# Patient Record
Sex: Male | Born: 1935 | Race: White | Hispanic: No | Marital: Married | Smoking: Never smoker
Health system: Southern US, Community
[De-identification: ages and names within clinical notes are randomized; demographics above are authoritative.]

## PROBLEM LIST (undated history)

## (undated) DIAGNOSIS — G8929 Other chronic pain: Secondary | ICD-10-CM

## (undated) DIAGNOSIS — F0390 Unspecified dementia without behavioral disturbance: Secondary | ICD-10-CM

## (undated) DIAGNOSIS — C787 Secondary malignant neoplasm of liver and intrahepatic bile duct: Secondary | ICD-10-CM

## (undated) DIAGNOSIS — M545 Low back pain, unspecified: Secondary | ICD-10-CM

## (undated) DIAGNOSIS — E538 Deficiency of other specified B group vitamins: Secondary | ICD-10-CM

## (undated) DIAGNOSIS — I10 Essential (primary) hypertension: Secondary | ICD-10-CM

## (undated) DIAGNOSIS — R269 Unspecified abnormalities of gait and mobility: Secondary | ICD-10-CM

## (undated) HISTORY — DX: Deficiency of other specified B group vitamins: E53.8

## (undated) HISTORY — DX: Secondary malignant neoplasm of liver and intrahepatic bile duct: C78.7

## (undated) HISTORY — DX: Other chronic pain: G89.29

## (undated) HISTORY — PX: COLONOSCOPY: SHX5424

## (undated) HISTORY — PX: CATARACT EXTRACTION, BILATERAL: SHX1313

## (undated) HISTORY — DX: Unspecified dementia without behavioral disturbance: F03.90

## (undated) HISTORY — DX: Unspecified abnormalities of gait and mobility: R26.9

## (undated) HISTORY — DX: Low back pain, unspecified: M54.50

## (undated) HISTORY — DX: Low back pain: M54.5

---

## 2000-11-24 ENCOUNTER — Ambulatory Visit (HOSPITAL_COMMUNITY): Admission: RE | Admit: 2000-11-24 | Discharge: 2000-11-24 | Payer: Self-pay | Admitting: Internal Medicine

## 2011-01-22 ENCOUNTER — Telehealth: Payer: Self-pay

## 2011-01-22 NOTE — Telephone Encounter (Signed)
Gastroenterology Pre-Procedure Form  Date of referral:  01/20/2011          Referring physician: Dr. Ouida Sills   PATIENT INFORMATION:  Luis Freeman is a 77 y.o., male (DOB=05/04/1933).  PROCEDURE: Procedure(s) requested: colonoscopy Procedure Reason: screening for colon cancer  PATIENT REVIEW QUESTIONS: The patient reports the following:   1. Diabetes Melitis: no 2. Joint replacements in the past 12 months: no 3. Major health problems in the past 3 months: no 4. Has an artificial valve or MVP:no 5. Has been advised in past to take antibiotics in advance of a procedure like teeth cleaning: no}    MEDICATIONS & ALLERGIES:    Patient reports the following regarding taking any blood thinners:   Plavix? no Aspirin? Yes  81 mg Coumadin?  no  Patient confirms/reports the following medications:  Current Outpatient Prescriptions  Medication Sig Dispense Refill  . amLODipine (NORVASC) 5 MG tablet Take 5 mg by mouth daily.        Marland Kitchen aspirin 81 MG tablet Take 81 mg by mouth daily.        . benazepril (LOTENSIN) 40 MG tablet Take 40 mg by mouth daily.        . hydrochlorothiazide 25 MG tablet Take 25 mg by mouth daily. 1/2 tablet daily         Patient confirms/reports the following allergies:  No Known Allergies  Patient is appropriate to schedule for requested procedure(s): yes  AUTHORIZATION INFORMATION Primary Insurance: Medicare    ID #: [id number]  Pre-Cert / Auth required: Pre-Cert / Auth #:   Secondary Insurance: BCBS state    ID #: [id number]   Group #:  Pre-Cert / Auth required:     No orders of the defined types were placed in this encounter.    SCHEDULE INFORMATION: Procedure has been scheduled as follows:  Date: 02/04/2011   Time: 9:30 Am  Location: [facility] Short Stay  This Gastroenterology Pre-Precedure Form is being routed to the following provider(s) for review: R. Roetta Sessions, MD  Selena Batten is aware of the appt.

## 2011-01-22 NOTE — Telephone Encounter (Signed)
OK FOR TCS

## 2011-01-23 NOTE — Telephone Encounter (Signed)
Rx and instructions for prep mailed.

## 2011-02-04 ENCOUNTER — Ambulatory Visit (HOSPITAL_COMMUNITY)
Admission: RE | Admit: 2011-02-04 | Discharge: 2011-02-04 | Disposition: A | Discharge status: Home / Self Care | Payer: Medicare Other | Source: Ambulatory Visit | Attending: Internal Medicine | Admitting: Internal Medicine

## 2011-02-04 ENCOUNTER — Encounter (HOSPITAL_COMMUNITY)
Admission: RE | Disposition: A | Discharge status: Home / Self Care | Payer: Self-pay | Source: Ambulatory Visit | Attending: Internal Medicine

## 2011-02-04 ENCOUNTER — Encounter (HOSPITAL_COMMUNITY): Payer: Self-pay | Admitting: *Deleted

## 2011-02-04 ENCOUNTER — Other Ambulatory Visit: Payer: Self-pay | Admitting: Internal Medicine

## 2011-02-04 ENCOUNTER — Encounter: Payer: Self-pay | Admitting: Internal Medicine

## 2011-02-04 DIAGNOSIS — Z1211 Encounter for screening for malignant neoplasm of colon: Secondary | ICD-10-CM

## 2011-02-04 DIAGNOSIS — K621 Rectal polyp: Secondary | ICD-10-CM

## 2011-02-04 DIAGNOSIS — D129 Benign neoplasm of anus and anal canal: Secondary | ICD-10-CM | POA: Insufficient documentation

## 2011-02-04 DIAGNOSIS — Z7982 Long term (current) use of aspirin: Secondary | ICD-10-CM | POA: Insufficient documentation

## 2011-02-04 DIAGNOSIS — D128 Benign neoplasm of rectum: Secondary | ICD-10-CM | POA: Insufficient documentation

## 2011-02-04 DIAGNOSIS — I10 Essential (primary) hypertension: Secondary | ICD-10-CM | POA: Insufficient documentation

## 2011-02-04 DIAGNOSIS — K620 Anal polyp: Secondary | ICD-10-CM

## 2011-02-04 DIAGNOSIS — Z79899 Other long term (current) drug therapy: Secondary | ICD-10-CM | POA: Insufficient documentation

## 2011-02-04 HISTORY — PX: COLONOSCOPY: SHX5424

## 2011-02-04 HISTORY — DX: Essential (primary) hypertension: I10

## 2011-02-04 SURGERY — COLONOSCOPY
Anesthesia: Moderate Sedation

## 2011-02-04 MED ORDER — MIDAZOLAM HCL 5 MG/5ML IJ SOLN
INTRAMUSCULAR | Status: DC | PRN
  Administered 2011-02-04: 2 mg via INTRAVENOUS
  Administered 2011-02-04: 1 mg via INTRAVENOUS

## 2011-02-04 MED ORDER — MEPERIDINE HCL 100 MG/ML IJ SOLN
INTRAMUSCULAR | Status: DC | PRN
  Administered 2011-02-04: 25 mg via INTRAVENOUS
  Administered 2011-02-04: 50 mg via INTRAVENOUS

## 2011-02-04 MED ORDER — MIDAZOLAM HCL 5 MG/5ML IJ SOLN
INTRAMUSCULAR | Status: AC
  Filled 2011-02-04: qty 10

## 2011-02-04 MED ORDER — SODIUM CHLORIDE 0.45 % IV SOLN
Freq: Once | INTRAVENOUS | Status: AC
  Administered 2011-02-04: 10:00:00 via INTRAVENOUS

## 2011-02-04 MED ORDER — MEPERIDINE HCL 100 MG/ML IJ SOLN
INTRAMUSCULAR | Status: AC
  Filled 2011-02-04: qty 2

## 2011-02-04 NOTE — H&P (Signed)
  Primary Care Physician:  Carylon Perches, MD Primary Gastroenterologist:  Dr.   Pre-Procedure History & Physical: HPI:  Luis Freeman is a 77 y.o. male is here for a screening colonoscopy. No bowel symptoms. No family history of colon cancer or polyps. Last colonoscopy was 10 years ago. A benign polyp was removed.  Past Medical History  Diagnosis Date  . Hypertension     Past Surgical History  Procedure Date  . Colonoscopy     Prior to Admission medications   Medication Sig Start Date End Date Taking? Authorizing Provider  amLODipine (NORVASC) 5 MG tablet Take 5 mg by mouth daily.     Yes Historical Provider, MD  aspirin 81 MG tablet Take 81 mg by mouth daily.     Yes Historical Provider, MD  benazepril (LOTENSIN) 40 MG tablet Take 40 mg by mouth daily.     Yes Historical Provider, MD  hydrochlorothiazide 25 MG tablet Take 25 mg by mouth daily. 1/2 tablet daily    Yes Historical Provider, MD    Allergies as of 01/22/2011  . (No Known Allergies)    History reviewed. No pertinent family history.  History   Social History  . Marital Status: Married    Spouse Name: N/A    Number of Children: N/A  . Years of Education: N/A   Occupational History  . Not on file.   Social History Main Topics  . Smoking status: Never Smoker   . Smokeless tobacco: Not on file  . Alcohol Use: 5.4 oz/week    3 Glasses of wine, 6 Cans of beer per week  . Drug Use: No  . Sexually Active:    Other Topics Concern  . Not on file   Social History Narrative  . No narrative on file    Review of Systems: See HPI, otherwise negative ROS  Physical Exam: BP 142/81  Pulse 62  Temp(Src) 98.6 F (37 C) (Oral)  Resp 18  Ht 5\' 11"  (1.803 m)  Wt 74.39 kg (164 lb)  BMI 22.87 kg/m2  SpO2 98% General:   Alert,  Well-developed, well-nourished, pleasant and cooperative in NAD Head:  Normocephalic and atraumatic. Eyes:  Sclera clear, no icterus.   Conjunctiva pink. Ears:  Normal auditory  acuity. Nose:  No deformity, discharge,  or lesions. Mouth:  No deformity or lesions, dentition normal. Neck:  Supple; no masses or thyromegaly. Lungs:  Clear throughout to auscultation.   No wheezes, crackles, or rhonchi. No acute distress. Heart:  Regular rate and rhythm; no murmurs, clicks, rubs,  or gallops. Abdomen:  Soft, nontender and nondistended. No masses, hepatosplenomegaly or hernias noted. Normal bowel sounds, without guarding, and without rebound.   Msk:  Symmetrical without gross deformities. Normal posture. Pulses:  Normal pulses noted. Extremities:  Without clubbing or edema. Neurologic:  Alert and  oriented x4;  grossly normal neurologically. Skin:  Intact without significant lesions or rashes. Cervical Nodes:  No significant cervical adenopathy. Psych:  Alert and cooperative. Normal mood and affect.  Impression/Plan: Luis Freeman is now here to undergo a screening colonoscopy.  Risks, benefits, limitations, imponderables and alternatives regarding colonoscopy have been reviewed with the patient. Questions have been answered. All parties agreeable.

## 2011-02-13 ENCOUNTER — Encounter (HOSPITAL_COMMUNITY): Payer: Self-pay | Admitting: Internal Medicine

## 2011-05-02 ENCOUNTER — Emergency Department (HOSPITAL_COMMUNITY)
Admission: EM | Admit: 2011-05-02 | Discharge: 2011-05-02 | Disposition: A | Discharge status: Home / Self Care | Payer: Medicare Other | Attending: Emergency Medicine | Admitting: Emergency Medicine

## 2011-05-02 ENCOUNTER — Encounter (HOSPITAL_COMMUNITY): Payer: Self-pay | Admitting: *Deleted

## 2011-05-02 DIAGNOSIS — M545 Low back pain, unspecified: Secondary | ICD-10-CM | POA: Insufficient documentation

## 2011-05-02 DIAGNOSIS — I10 Essential (primary) hypertension: Secondary | ICD-10-CM | POA: Insufficient documentation

## 2011-05-02 DIAGNOSIS — M549 Dorsalgia, unspecified: Secondary | ICD-10-CM

## 2011-05-02 MED ORDER — HYDROCODONE-ACETAMINOPHEN 5-325 MG PO TABS: 1.0000 | ORAL_TABLET | ORAL | Status: AC | PRN

## 2011-05-02 MED ORDER — HYDROCODONE-ACETAMINOPHEN 5-325 MG PO TABS
2.0000 | ORAL_TABLET | Freq: Once | ORAL | Status: AC
  Administered 2011-05-02: 2 via ORAL

## 2011-05-02 MED ORDER — IBUPROFEN 800 MG PO TABS
800.0000 mg | ORAL_TABLET | Freq: Once | ORAL | Status: DC
  Filled 2011-05-02: qty 1

## 2011-05-02 MED ORDER — HYDROCODONE-ACETAMINOPHEN 5-325 MG PO TABS: 1.0000 | ORAL_TABLET | ORAL | Status: DC | PRN

## 2011-05-02 MED ORDER — IBUPROFEN 800 MG PO TABS: 800.0000 mg | ORAL_TABLET | Freq: Three times a day (TID) | ORAL | Status: AC

## 2011-05-02 MED ORDER — HYDROCODONE-ACETAMINOPHEN 5-325 MG PO TABS
2.0000 | ORAL_TABLET | Freq: Once | ORAL | Status: DC
  Filled 2011-05-02: qty 2

## 2011-05-02 MED ORDER — IBUPROFEN 800 MG PO TABS
800.0000 mg | ORAL_TABLET | Freq: Once | ORAL | Status: AC
  Administered 2011-05-02: 800 mg via ORAL

## 2011-05-02 MED ORDER — IBUPROFEN 800 MG PO TABS: 800.0000 mg | ORAL_TABLET | Freq: Three times a day (TID) | ORAL | Status: DC

## 2011-05-02 NOTE — ED Provider Notes (Signed)
History     CSN: [id number] Arrival date & time: 05/02/2011 10:40 PM  Chief Complaint  Patient presents with  . Back Pain    (Consider location/radiation/quality/duration/timing/severity/associated sxs/prior treatment) HPI Comments: Seen 2309  Patient is a 77 y.o. male presenting with back pain. The history is provided by the patient.  Back Pain  This is a new problem. 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 The problem occurs constantly. The problem has been gradually worsening. The pain is present in the lumbar spine. The quality of the pain is described as aching. The pain does not radiate. The pain is at a severity of 7/10. The pain is moderate. The symptoms are aggravated by bending, twisting and certain positions. Pertinent negatives include no chest pain, no fever, no numbness, no abdominal pain, no pelvic pain, no tingling and no weakness. He has tried nothing for the symptoms.    Past Medical History  Diagnosis Date  . Hypertension     Past Surgical History  Procedure Date  . Colonoscopy   . Colonoscopy 02/04/2011    Procedure: COLONOSCOPY;  Surgeon: Corbin Ade, MD;  Location: [facility];  Service: Endoscopy;  Laterality: N/A;    No family history on file.  History  Substance Use Topics  . Smoking status: Never Smoker   . Smokeless tobacco: Not on file  . Alcohol Use: 5.4 oz/week    3 Glasses of wine, 6 Cans of beer per week      Review of Systems  Constitutional: Negative for fever.  Cardiovascular: Negative for chest pain.  Gastrointestinal: Negative for abdominal pain.  Genitourinary: Negative for pelvic pain.  Musculoskeletal: Positive for back pain.  Neurological: Negative for tingling, weakness and numbness.  All other systems reviewed and are negative.    Allergies  Review of patient's allergies indicates no known allergies.  Home Medications   Current Outpatient Rx  Name  Route Sig Dispense Refill  . AMLODIPINE BESYLATE 5 MG PO TABS Oral Take 5 mg by mouth daily.      . ASPIRIN 81 MG PO TABS Oral Take 81 mg by mouth daily.      Marland Kitchen BENAZEPRIL HCL 40 MG PO TABS Oral Take 40 mg by mouth daily.      Marland Kitchen HYDROCHLOROTHIAZIDE 25 MG PO TABS Oral Take 25 mg by mouth daily. 1/2 tablet daily       BP 138/72  Pulse 78  Temp(Src) 98.1 F (36.7 C) (Oral)  Resp 20  Ht 5\' 9"  (1.753 m)  Wt 162 lb (73.483 kg)  BMI 23.92 kg/m2  SpO2 98%  Physical Exam  Nursing note and vitals reviewed. Constitutional: He is oriented to person, place, and time. He appears well-developed and well-nourished.  HENT:  Head: Normocephalic and atraumatic.  Eyes: EOM are normal.  Neck: Normal range of motion.  Cardiovascular: Normal rate, normal heart sounds and intact distal pulses.   Pulmonary/Chest: Effort normal and breath sounds normal.  Abdominal: Soft.  Musculoskeletal:       No spinal tenderness to percussion. Lumbar paraspinal muscle tenderness to palpation. Able to bend towardtoes. Increased pain with lateral bending  Neurological: He is alert and oriented to person, place, and time. He has normal reflexes.  Skin: Skin is warm and dry.    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 No  focal neurological findings. Pt stable in ED with no significant deterioration in condition. MDM Reviewed: nursing note and vitals          Nicoletta Dress. Colon Branch, MD 05/02/11 2321

## 2011-05-02 NOTE — ED Notes (Signed)
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

## 2011-05-02 NOTE — ED Notes (Signed)
Pt reports back pain began today. Reports pain in lower back, denies any radiating into hips or legs.  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  Denies numbness or tingling in extremities.  No needs at present time.

## 2011-05-19 ENCOUNTER — Other Ambulatory Visit (HOSPITAL_COMMUNITY): Payer: Self-pay | Admitting: Internal Medicine

## 2011-05-19 DIAGNOSIS — M25552 Pain in left hip: Secondary | ICD-10-CM

## 2011-05-20 ENCOUNTER — Ambulatory Visit (HOSPITAL_COMMUNITY)
Admission: RE | Admit: 2011-05-20 | Discharge: 2011-05-20 | Disposition: A | Discharge status: Home / Self Care | Payer: Medicare Other | Source: Ambulatory Visit | Attending: Internal Medicine | Admitting: Internal Medicine

## 2011-05-20 DIAGNOSIS — M545 Low back pain, unspecified: Secondary | ICD-10-CM | POA: Insufficient documentation

## 2011-05-20 DIAGNOSIS — M25552 Pain in left hip: Secondary | ICD-10-CM

## 2011-05-20 DIAGNOSIS — M5126 Other intervertebral disc displacement, lumbar region: Secondary | ICD-10-CM | POA: Insufficient documentation

## 2011-05-20 DIAGNOSIS — M25559 Pain in unspecified hip: Secondary | ICD-10-CM | POA: Insufficient documentation

## 2011-07-24 ENCOUNTER — Other Ambulatory Visit (HOSPITAL_COMMUNITY): Payer: Self-pay | Admitting: Orthopaedic Surgery

## 2011-07-24 ENCOUNTER — Ambulatory Visit (HOSPITAL_COMMUNITY)
Admission: RE | Admit: 2011-07-24 | Discharge: 2011-07-24 | Disposition: A | Discharge status: Home / Self Care | Payer: Medicare Other | Source: Ambulatory Visit | Attending: Orthopaedic Surgery | Admitting: Orthopaedic Surgery

## 2011-07-24 DIAGNOSIS — M5126 Other intervertebral disc displacement, lumbar region: Secondary | ICD-10-CM | POA: Insufficient documentation

## 2011-07-24 DIAGNOSIS — M545 Low back pain, unspecified: Secondary | ICD-10-CM | POA: Insufficient documentation

## 2011-07-24 DIAGNOSIS — T148XXA Other injury of unspecified body region, initial encounter: Secondary | ICD-10-CM

## 2011-07-24 DIAGNOSIS — M5137 Other intervertebral disc degeneration, lumbosacral region: Secondary | ICD-10-CM | POA: Insufficient documentation

## 2011-07-24 DIAGNOSIS — M51379 Other intervertebral disc degeneration, lumbosacral region without mention of lumbar back pain or lower extremity pain: Secondary | ICD-10-CM | POA: Insufficient documentation

## 2011-07-24 LAB — CREATININE, SERUM
Creatinine, Ser: 0.98 mg/dL (ref 0.50–1.35)
GFR calc Af Amer: >90 mL/min (ref >90)
GFR calc non Af Amer: 78 mL/min — ABNORMAL LOW (ref >90)

## 2011-07-24 MED ORDER — GADOBENATE DIMEGLUMINE 529 MG/ML IV SOLN
14.0000 mL | Freq: Once | INTRAVENOUS | Status: AC | PRN
  Administered 2011-07-24: 14 mL via INTRAVENOUS

## 2011-07-27 ENCOUNTER — Ambulatory Visit (HOSPITAL_COMMUNITY)
Admission: RE | Admit: 2011-07-27 | Discharge: 2011-07-27 | Disposition: A | Discharge status: Home / Self Care | Payer: Medicare Other | Source: Ambulatory Visit | Attending: Orthopaedic Surgery | Admitting: Orthopaedic Surgery

## 2011-07-27 DIAGNOSIS — M899 Disorder of bone, unspecified: Secondary | ICD-10-CM | POA: Insufficient documentation

## 2011-07-27 DIAGNOSIS — T148XXA Other injury of unspecified body region, initial encounter: Secondary | ICD-10-CM

## 2013-06-29 ENCOUNTER — Encounter (INDEPENDENT_AMBULATORY_CARE_PROVIDER_SITE_OTHER): Payer: Self-pay

## 2013-06-29 ENCOUNTER — Ambulatory Visit (INDEPENDENT_AMBULATORY_CARE_PROVIDER_SITE_OTHER): Payer: Medicare (Managed Care) | Admitting: Otolaryngology

## 2013-06-29 DIAGNOSIS — H903 Sensorineural hearing loss, bilateral: Secondary | ICD-10-CM

## 2014-06-28 ENCOUNTER — Ambulatory Visit (INDEPENDENT_AMBULATORY_CARE_PROVIDER_SITE_OTHER): Payer: Medicare Other | Admitting: Otolaryngology

## 2014-06-28 DIAGNOSIS — H903 Sensorineural hearing loss, bilateral: Secondary | ICD-10-CM

## 2014-07-20 HISTORY — PX: OTHER SURGICAL HISTORY: SHX169

## 2015-07-04 ENCOUNTER — Ambulatory Visit (INDEPENDENT_AMBULATORY_CARE_PROVIDER_SITE_OTHER): Payer: Medicare (Managed Care) | Admitting: Otolaryngology

## 2015-07-04 DIAGNOSIS — H903 Sensorineural hearing loss, bilateral: Secondary | ICD-10-CM | POA: Diagnosis not present

## 2015-10-20 ENCOUNTER — Encounter (HOSPITAL_COMMUNITY): Payer: Self-pay | Admitting: Emergency Medicine

## 2015-10-20 ENCOUNTER — Emergency Department (HOSPITAL_COMMUNITY)
Admission: EM | Admit: 2015-10-20 | Discharge: 2015-10-20 | Disposition: A | Discharge status: Home / Self Care | Payer: Medicare Other | Attending: Emergency Medicine | Admitting: Emergency Medicine

## 2015-10-20 ENCOUNTER — Emergency Department (HOSPITAL_COMMUNITY): Payer: Medicare Other

## 2015-10-20 DIAGNOSIS — J069 Acute upper respiratory infection, unspecified: Secondary | ICD-10-CM | POA: Diagnosis not present

## 2015-10-20 DIAGNOSIS — I10 Essential (primary) hypertension: Secondary | ICD-10-CM | POA: Insufficient documentation

## 2015-10-20 DIAGNOSIS — Z79899 Other long term (current) drug therapy: Secondary | ICD-10-CM | POA: Diagnosis not present

## 2015-10-20 DIAGNOSIS — R05 Cough: Secondary | ICD-10-CM | POA: Diagnosis present

## 2015-10-20 DIAGNOSIS — Z7982 Long term (current) use of aspirin: Secondary | ICD-10-CM | POA: Insufficient documentation

## 2015-10-20 MED ORDER — PREDNISONE 10 MG PO TABS
ORAL_TABLET | ORAL | Status: AC
  Filled 2015-10-20: qty 1

## 2015-10-20 MED ORDER — PREDNISONE 50 MG PO TABS
ORAL_TABLET | ORAL | Status: AC
  Filled 2015-10-20: qty 1

## 2015-10-20 MED ORDER — BENZONATATE 100 MG PO CAPS
200.0000 mg | ORAL_CAPSULE | Freq: Once | ORAL | Status: AC
  Administered 2015-10-20: 200 mg via ORAL
  Filled 2015-10-20: qty 2

## 2015-10-20 MED ORDER — PREDNISONE 10 MG PO TABS: ORAL_TABLET | ORAL | Status: DC

## 2015-10-20 MED ORDER — BENZONATATE 100 MG PO CAPS
200.0000 mg | ORAL_CAPSULE | Freq: Three times a day (TID) | ORAL | Status: DC | PRN
Start: 2015-10-20 — End: 2018-02-05

## 2015-10-20 MED ORDER — PREDNISONE 50 MG PO TABS
60.0000 mg | ORAL_TABLET | Freq: Once | ORAL | Status: AC
  Administered 2015-10-20: 60 mg via ORAL

## 2015-10-20 MED ORDER — ALBUTEROL SULFATE HFA 108 (90 BASE) MCG/ACT IN AERS: 1.0000 | INHALATION_SPRAY | Freq: Four times a day (QID) | RESPIRATORY_TRACT | Status: DC | PRN

## 2015-10-20 NOTE — ED Notes (Signed)
Pt reports a cough x several days. Pt also reports some generalized weakness. Pt in no acute distress

## 2015-10-20 NOTE — ED Provider Notes (Signed)
History  By signing my name below, I, Luis Freeman, attest that this documentation has been prepared under the direction and in the presence of Luis Jefferson, PA-C. Electronically Signed: Marlowe Freeman, ED Scribe. 10/20/2015. 5:02 PM.  Chief Complaint  Patient presents with  . Cough   The history is provided by the patient and medical records. No language interpreter was used.    HPI Comments:  Luis Freeman is a 82 y.o. male who presents to the Emergency Department complaining of a dry, nonproductive cough that began about 1.5 weeks ago. Pt reports one episode of diaphoresis for a few hours that resolved on its own.  He states he went to see his PCP, Dr. Willey Blade, three days ago and was doing better. Pt was told to take Delsym with some relief of the symptoms. He has taken Advil and been using cough drops. He states his symptoms have since worsened in the past week. He reports associated decreased appetite and aching mid to low back pain secondary to the cough. Coughing also causes some chest discomfort with the back pain. Deep breathing, bending and twisting slightly exacerbate the back pain. He denies alleviating factors. He denies fever, chills, SOB, sore throat, nasal congestion, abdominal pain, nausea, vomiting, lower extremity swelling. He denies smoking.   Past Medical History  Diagnosis Date  . Hypertension    Past Surgical History  Procedure Laterality Date  . Colonoscopy    . Colonoscopy  02/04/2011    Procedure: COLONOSCOPY;  Surgeon: Daneil Dolin, MD;  Location: [facility];  Service: Endoscopy;  Laterality: N/A;   History reviewed. No pertinent family history. Social History  Substance Use Topics  . Smoking status: Never Smoker   . Smokeless tobacco: None  . Alcohol Use: 5.4 oz/week    3 Glasses of wine, 6 Cans of beer per week    Review of Systems  Constitutional: Negative for fever.  HENT: Negative for congestion and sore throat.   Eyes: Negative.    Respiratory: Positive for cough. Negative for chest tightness and shortness of breath.   Cardiovascular: Negative for chest pain.  Gastrointestinal: Negative for nausea and abdominal pain.  Genitourinary: Positive for frequency.  Musculoskeletal: Positive for myalgias. Negative for joint swelling, arthralgias and neck pain.  Skin: Negative.  Negative for rash and wound.  Neurological: Negative for dizziness, weakness, light-headedness, numbness and headaches.  Psychiatric/Behavioral: Negative.     Allergies  Review of patient's allergies indicates no known allergies.  Home Medications   Prior to Admission medications   Medication Sig Start Date End Date Taking? Authorizing Provider  albuterol (PROVENTIL HFA;VENTOLIN HFA) 108 (90 Base) MCG/ACT inhaler Inhale 1-2 puffs into the lungs every 6 (six) hours as needed for wheezing or shortness of breath. 10/20/15   Luis Jefferson, PA-C  alendronate (FOSAMAX) 70 MG tablet Take 70 mg by mouth once a week.  09/27/15   Historical Provider, MD  amLODipine (NORVASC) 5 MG tablet Take 5 mg by mouth daily.      Historical Provider, MD  aspirin 81 MG tablet Take 81 mg by mouth daily.      Historical Provider, MD  benazepril (LOTENSIN) 40 MG tablet Take 40 mg by mouth daily.      Historical Provider, MD  benzonatate (TESSALON) 100 MG capsule Take 2 capsules (200 mg total) by mouth 3 (three) times daily as needed. 10/20/15   Luis Jefferson, PA-C  cyanocobalamin (,VITAMIN B-12,) 1000 MCG/ML injection Inject 1,000 mcg into the muscle.  08/02/15  Historical Provider, MD  hydrochlorothiazide 25 MG tablet Take 25 mg by mouth daily. 1/2 tablet daily     Historical Provider, MD  predniSONE (DELTASONE) 10 MG tablet 6, 5, 4, 3, 2 then 1 tablet by mouth daily for 6 days total. 10/20/15   Luis Jefferson, PA-C   Triage Vitals: BP 154/89 mmHg  Pulse 91  Temp(Src) 97.7 F (36.5 C) (Oral)  Resp 14  Ht 5' 10.5" (1.791 m)  Wt 155 lb (70.308 kg)  BMI 21.92 kg/m2  SpO2 97% Physical  Exam  Constitutional: He appears well-developed and well-nourished.  HENT:  Head: Normocephalic and atraumatic.  Mouth/Throat: Uvula is midline, oropharynx is clear and moist and mucous membranes are normal.  Eyes: Conjunctivae are normal.  Neck: Normal range of motion.  Cardiovascular: Normal rate, regular rhythm, normal heart sounds and intact distal pulses.   Pulmonary/Chest: Effort normal. No respiratory distress. He has wheezes. He has no rales.  Faint expiratory wheeze, clears with cough  Abdominal: Soft. Bowel sounds are normal. There is no tenderness.  Musculoskeletal: Normal range of motion.  Neurological: He is alert.  Skin: Skin is warm and dry.  Psychiatric: He has a normal mood and affect.  Nursing note and vitals reviewed.   ED Course  Procedures (including critical care time) DIAGNOSTIC STUDIES: Oxygen Saturation is 97% on RA, normal by my interpretation.   COORDINATION OF CARE: 4:56 PM- Informed pt of X-Ray findings. Will prescribe Tessalon for day time use and a cough syrup for use at bedtime. Will give first dose of Tessalon prior to discharge. Pt verbalizes understanding and agrees to plan.  Medications  benzonatate (TESSALON) capsule 200 mg (200 mg Oral Given 10/20/15 1708)  predniSONE (DELTASONE) tablet 60 mg (60 mg Oral Given 10/20/15 1814)    Labs Review Labs Reviewed - No data to display  Imaging Review Dg Chest 2 View  10/20/2015  CLINICAL DATA:  Cough, back pain for several days. Generalized weakness. EXAM: CHEST  2 VIEW COMPARISON:  None. FINDINGS: Heart size is normal. Lungs are hyperexpanded indicating COPD. Suspect associated chronic bronchitic changes centrally and scattered areas of associated scarring/ fibrosis within each lung. No confluent opacity to suggest a developing pneumonia. No pleural effusion or pneumothorax seen. Mild degenerative spurring noted within the slightly scoliotic thoracic spine. No evidence of acute osseous abnormality.  IMPRESSION: 1. Hyperexpanded lungs suggesting COPD/emphysema. Suspect associated chronic bronchitic changes centrally and areas of chronic scarring/fibrosis within each lung. 2. No acute findings.  No evidence of pneumonia. Electronically Signed   By: Franki Cabot M.D.   On: 10/20/2015 16:43   I have personally reviewed and evaluated these images and lab results as part of my medical decision-making.   EKG Interpretation None      MDM   Final diagnoses:  Acute URI    Pt also seen by Dr. Sabra Heck during this visit.  Pt placed on prednisone taper, also prescribed albuterol mdi for prn use, tessalon perles, first doses given here.  No pneumonia on xray.  PRN f/u with pcp if sx persist/worsen.  I personally performed the services described in this documentation, which was scribed in my presence. The recorded information has been reviewed and is accurate.   Luis Jefferson, PA-C 10/22/15 1438  Noemi Chapel, MD 10/23/15 [phone]

## 2015-10-20 NOTE — ED Notes (Signed)
Pt reports "barking" nonproductive cough x9 days with congestion. Pt generalized soreness.

## 2015-10-20 NOTE — Discharge Instructions (Signed)
Upper Respiratory Infection, Adult Most upper respiratory infections (URIs) are a viral infection of the air passages leading to the lungs. A URI affects the nose, throat, and upper air passages. The most common type of URI is nasopharyngitis and is typically referred to as "the common cold." URIs run their course and usually go away on their own. Most of the time, a URI does not require medical attention, but sometimes a bacterial infection in the upper airways can follow a viral infection. This is called a secondary infection. Sinus and middle ear infections are common types of secondary upper respiratory infections. Bacterial pneumonia can also complicate a URI. A URI can worsen asthma and chronic obstructive pulmonary disease (COPD). Sometimes, these complications can require emergency medical care and may be life threatening.  CAUSES Almost all URIs are caused by viruses. A virus is a type of germ and can spread from one person to another.  RISKS FACTORS You may be at risk for a URI if:   You smoke.   You have chronic heart or lung disease.  You have a weakened defense (immune) system.   You are very young or very old.   You have nasal allergies or asthma.  You work in crowded or poorly ventilated areas.  You work in health care facilities or schools. SIGNS AND SYMPTOMS  Symptoms typically develop 2-3 days after you come in contact with a cold virus. Most viral URIs last 7-10 days. However, viral URIs from the influenza virus (flu virus) can last 14-18 days and are typically more severe. Symptoms may include:   Runny or stuffy (congested) nose.   Sneezing.   Cough.   Sore throat.   Headache.   Fatigue.   Fever.   Loss of appetite.   Pain in your forehead, behind your eyes, and over your cheekbones (sinus pain).  Muscle aches.  DIAGNOSIS  Your health care provider may diagnose a URI by:  Physical exam.  Tests to check that your symptoms are not due to  another condition such as:  Strep throat.  Sinusitis.  Pneumonia.  Asthma. TREATMENT  A URI goes away on its own with time. It cannot be cured with medicines, but medicines may be prescribed or recommended to relieve symptoms. Medicines may help:  Reduce your fever.  Reduce your cough.  Relieve nasal congestion. HOME CARE INSTRUCTIONS   Take medicines only as directed by your health care provider.   Gargle warm saltwater or take cough drops to comfort your throat as directed by your health care provider.  Use a warm mist humidifier or inhale steam from a shower to increase air moisture. This may make it easier to breathe.  Drink enough fluid to keep your urine clear or pale yellow.   Eat soups and other clear broths and maintain good nutrition.   Rest as needed.   Return to work when your temperature has returned to normal or as your health care provider advises. You may need to stay home longer to avoid infecting others. You can also use a face mask and careful hand washing to prevent spread of the virus.  Increase the usage of your inhaler if you have asthma.   Do not use any tobacco products, including cigarettes, chewing tobacco, or electronic cigarettes. If you need help quitting, ask your health care provider. PREVENTION  The best way to protect yourself from getting a cold is to practice good hygiene.   Avoid oral or hand contact with people with cold   symptoms.   Wash your hands often if contact occurs.  There is no clear evidence that vitamin C, vitamin E, echinacea, or exercise reduces the chance of developing a cold. However, it is always recommended to get plenty of rest, exercise, and practice good nutrition.  SEEK MEDICAL CARE IF:   You are getting worse rather than better.   Your symptoms are not controlled by medicine.   You have chills.  You have worsening shortness of breath.  You have brown or red mucus.  You have yellow or brown nasal  discharge.  You have pain in your face, especially when you bend forward.  You have a fever.  You have swollen neck glands.  You have pain while swallowing.  You have white areas in the back of your throat. SEEK IMMEDIATE MEDICAL CARE IF:   You have severe or persistent:  Headache.  Ear pain.  Sinus pain.  Chest pain.  You have chronic lung disease and any of the following:  Wheezing.  Prolonged cough.  Coughing up blood.  A change in your usual mucus.  You have a stiff neck.  You have changes in your:  Vision.  Hearing.  Thinking.  Mood. MAKE SURE YOU:   Understand these instructions.  Will watch your condition.  Will get help right away if you are not doing well or get worse.   This information is not intended to replace advice given to you by your health care provider. Make sure you discuss any questions you have with your health care provider.   Document Released: 12/30/2000 Document Revised: 11/20/2014 Document Reviewed: 10/11/2013 Elsevier Interactive Patient Education 2016 [employer]  

## 2015-10-21 NOTE — ED Provider Notes (Signed)
The pt is 33 and has been coughing - he is concerned for pneumonia On exam he has scant expiratory wheezing  - no distress, no edema, no increased WOB and no accessory muscle use - abdomen is soft and heart is regular without tachycardia.  Xray without acute findings.  Pt given meds as below - he and family were informed of treatment plan and are in agreement.  Meds given in ED:  Medications  benzonatate (TESSALON) capsule 200 mg (200 mg Oral Given 10/20/15 1708)  predniSONE (DELTASONE) tablet 60 mg (60 mg Oral Given 10/20/15 1814)    Discharge Medication List as of 10/20/2015  5:41 PM    START taking these medications   Details  albuterol (PROVENTIL HFA;VENTOLIN HFA) 108 (90 Base) MCG/ACT inhaler Inhale 1-2 puffs into the lungs every 6 (six) hours as needed for wheezing or shortness of breath., Starting 10/20/2015, Until Discontinued, Print    benzonatate (TESSALON) 100 MG capsule Take 2 capsules (200 mg total) by mouth 3 (three) times daily as needed., Starting 10/20/2015, Until Discontinued, Print    predniSONE (DELTASONE) 10 MG tablet 6, 5, 4, 3, 2 then 1 tablet by mouth daily for 6 days total., Print        Medical screening examination/treatment/procedure(s) were conducted as a shared visit with non-physician practitioner(s) and myself.  I personally evaluated the patient during the encounter.  Clinical Impression:   Final diagnoses:  Acute URI         Noemi Chapel, MD 10/23/15 [phone]

## 2016-01-28 ENCOUNTER — Other Ambulatory Visit (HOSPITAL_COMMUNITY): Payer: Self-pay | Admitting: Internal Medicine

## 2016-01-28 DIAGNOSIS — M810 Age-related osteoporosis without current pathological fracture: Secondary | ICD-10-CM

## 2016-01-28 DIAGNOSIS — M8580 Other specified disorders of bone density and structure, unspecified site: Secondary | ICD-10-CM

## 2016-01-28 DIAGNOSIS — Z79899 Other long term (current) drug therapy: Secondary | ICD-10-CM

## 2016-02-06 ENCOUNTER — Other Ambulatory Visit (HOSPITAL_COMMUNITY): Payer: Medicare (Managed Care)

## 2016-02-11 ENCOUNTER — Ambulatory Visit (HOSPITAL_COMMUNITY)
Admission: RE | Admit: 2016-02-11 | Discharge: 2016-02-11 | Disposition: A | Discharge status: Home / Self Care | Payer: Medicare Other | Source: Ambulatory Visit | Attending: Internal Medicine | Admitting: Internal Medicine

## 2016-02-11 DIAGNOSIS — M85852 Other specified disorders of bone density and structure, left thigh: Secondary | ICD-10-CM | POA: Diagnosis not present

## 2016-02-11 DIAGNOSIS — Z79899 Other long term (current) drug therapy: Secondary | ICD-10-CM | POA: Diagnosis present

## 2016-02-11 DIAGNOSIS — M810 Age-related osteoporosis without current pathological fracture: Secondary | ICD-10-CM | POA: Diagnosis present

## 2016-07-02 ENCOUNTER — Ambulatory Visit (INDEPENDENT_AMBULATORY_CARE_PROVIDER_SITE_OTHER): Payer: Medicare Other | Admitting: Otolaryngology

## 2016-07-02 DIAGNOSIS — H903 Sensorineural hearing loss, bilateral: Secondary | ICD-10-CM | POA: Diagnosis not present

## 2017-07-01 ENCOUNTER — Ambulatory Visit (INDEPENDENT_AMBULATORY_CARE_PROVIDER_SITE_OTHER): Payer: Medicare Other | Admitting: Otolaryngology

## 2017-07-01 DIAGNOSIS — H903 Sensorineural hearing loss, bilateral: Secondary | ICD-10-CM

## 2017-10-01 ENCOUNTER — Other Ambulatory Visit (HOSPITAL_COMMUNITY): Payer: Self-pay | Admitting: Internal Medicine

## 2017-10-01 DIAGNOSIS — R410 Disorientation, unspecified: Secondary | ICD-10-CM

## 2017-11-02 ENCOUNTER — Ambulatory Visit (HOSPITAL_COMMUNITY)
Admission: RE | Admit: 2017-11-02 | Discharge: 2017-11-02 | Disposition: A | Discharge status: Home / Self Care | Payer: Medicare Other | Source: Ambulatory Visit | Attending: Internal Medicine | Admitting: Internal Medicine

## 2017-11-02 DIAGNOSIS — R410 Disorientation, unspecified: Secondary | ICD-10-CM

## 2018-02-05 ENCOUNTER — Other Ambulatory Visit: Payer: Self-pay

## 2018-02-05 ENCOUNTER — Emergency Department (HOSPITAL_COMMUNITY)
Admission: EM | Admit: 2018-02-05 | Discharge: 2018-02-05 | Disposition: A | Discharge status: Home / Self Care | Payer: Medicare Other | Attending: Emergency Medicine | Admitting: Emergency Medicine

## 2018-02-05 ENCOUNTER — Encounter (HOSPITAL_COMMUNITY): Payer: Self-pay | Admitting: Emergency Medicine

## 2018-02-05 DIAGNOSIS — Z79899 Other long term (current) drug therapy: Secondary | ICD-10-CM | POA: Insufficient documentation

## 2018-02-05 DIAGNOSIS — R079 Chest pain, unspecified: Secondary | ICD-10-CM | POA: Insufficient documentation

## 2018-02-05 DIAGNOSIS — Z7982 Long term (current) use of aspirin: Secondary | ICD-10-CM | POA: Insufficient documentation

## 2018-02-05 DIAGNOSIS — I10 Essential (primary) hypertension: Secondary | ICD-10-CM | POA: Insufficient documentation

## 2018-02-05 DIAGNOSIS — M79602 Pain in left arm: Secondary | ICD-10-CM | POA: Diagnosis present

## 2018-02-05 LAB — CBC WITH DIFFERENTIAL/PLATELET
Basophils Absolute: 0 10*3/uL (ref 0.0–0.1)
Basophils Relative: 1 %
Eosinophils Absolute: 0 10*3/uL (ref 0.0–0.7)
Eosinophils Relative: 0 %
HCT: 40.2 % (ref 39.0–52.0)
Hemoglobin: 13.9 g/dL (ref 13.0–17.0)
Lymphocytes Relative: 12 %
Lymphs Abs: 0.8 10*3/uL (ref 0.7–4.0)
MCH: 32.6 pg (ref 26.0–34.0)
MCHC: 34.6 g/dL (ref 30.0–36.0)
MCV: 94.1 fL (ref 78.0–100.0)
Monocytes Absolute: 0.7 10*3/uL (ref 0.1–1.0)
Monocytes Relative: 11 %
Neutro Abs: 4.9 10*3/uL (ref 1.7–7.7)
Neutrophils Relative %: 76 %
Platelets: 279 10*3/uL (ref 150–400)
RBC: 4.27 MIL/uL (ref 4.22–5.81)
RDW: 12.7 % (ref 11.5–15.5)
WBC: 6.4 10*3/uL (ref 4.0–10.5)

## 2018-02-05 LAB — BASIC METABOLIC PANEL
Anion gap: 9 (ref 5–15)
BUN: 15 mg/dL (ref 8–23)
CO2: 24 mmol/L (ref 22–32)
Calcium: 8.9 mg/dL (ref 8.9–10.3)
Chloride: 94 mmol/L — ABNORMAL LOW (ref 98–111)
Creatinine, Ser: 0.94 mg/dL (ref 0.61–1.24)
GFR calc Af Amer: >60 mL/min (ref >60)
GFR calc non Af Amer: >60 mL/min (ref >60)
Glucose, Bld: 122 mg/dL — ABNORMAL HIGH (ref 70–99)
Potassium: 3.7 mmol/L (ref 3.5–5.1)
Sodium: 127 mmol/L — ABNORMAL LOW (ref 135–145)

## 2018-02-05 LAB — TROPONIN I
Troponin I: <0.03 ng/mL (ref <0.03)
Troponin I: <0.03 ng/mL (ref <0.03)

## 2018-02-05 MED ORDER — ASPIRIN 81 MG PO CHEW
324.0000 mg | CHEWABLE_TABLET | Freq: Once | ORAL | Status: DC
  Filled 2018-02-05: qty 4

## 2018-02-05 NOTE — ED Triage Notes (Signed)
Patient c/o left arm pain that started this morning, shortly after waking. Denies any known injury, chest pain, or shortness of breath. Patient unable to describe pain. Family does state he was picking limbs in yard yesterday. No increase in pain with movement or palpation.

## 2018-02-05 NOTE — Discharge Instructions (Addendum)
We saw you in the ER for the chest pain and left shoulder pain. All of our cardiac workup is normal and you are not having any active shoulder pain. We are not sure what is causing your discomfort.  You have preferred going home, and for an outpatient work-up.  The workup in the ER is not complete, and you should follow up with the cardiologist as requested.  Please return to the ER if you have worsening chest pain, shortness of breath, pain radiating to your jaw, shoulder, or back, sweats or fainting. Otherwise see the Cardiologist or your primary care doctor as requested.

## 2018-02-05 NOTE — ED Provider Notes (Addendum)
[facility] EMERGENCY DEPARTMENT Provider Note   CSN: [id number] Arrival date & time: 02/05/18  1403     History   Chief Complaint Chief Complaint  Patient presents with  . Arm Pain    HPI Luis Freeman is a 84 y.o. male.  HPI  84 year old male comes in with chief complaint of left shoulder pain.  Patient has history of hypertension.  He reports that he woke up this morning and noted left shoulder pain, which lasted for several minutes.  The pain was located in the upper left chest and the left shoulder and arm.  Patient describes the pain as dull in nature, constant without any specific aggravating or relieving factors.  Patient denies any associated nausea, vomiting, fevers.  Past Medical History:  Diagnosis Date  . Hypertension     There are no active problems to display for this patient.   Past Surgical History:  Procedure Laterality Date  . COLONOSCOPY    . COLONOSCOPY  02/04/2011   Procedure: COLONOSCOPY;  Surgeon: Daneil Dolin, MD;  Location: [facility];  Service: Endoscopy;  Laterality: N/A;        Home Medications    Prior to Admission medications   Medication Sig Start Date End Date Taking? Authorizing Provider  amLODipine (NORVASC) 5 MG tablet Take 5 mg by mouth daily.     Yes [provider]  aspirin 81 MG tablet Take 81 mg by mouth daily.     Yes [provider]  aspirin EC 325 MG tablet Take 325 mg by mouth once.   Yes [provider]  benazepril (LOTENSIN) 40 MG tablet Take 40 mg by mouth daily.     Yes [provider]  cholecalciferol (VITAMIN D) 1000 units tablet Take 1,000 Units by mouth daily.   Yes [provider]  cyanocobalamin (,VITAMIN B-12,) 1000 MCG/ML injection Inject 1,000 mcg into the muscle every 30 (thirty) days.  08/02/15  Yes [provider]  hydrochlorothiazide (HYDRODIURIL) 25 MG tablet Take 25 mg by mouth daily.   Yes [provider]    Family History No family  history on file.  Social History Social History   Tobacco Use  . Smoking status: Never Smoker  . Smokeless tobacco: Never Used  Substance Use Topics  . Alcohol use: Yes    Alcohol/week: 1.2 oz    Types: 2 Cans of beer per week  . Drug use: No     Allergies   Patient has no known allergies.   Review of Systems Review of Systems  Constitutional: Negative for activity change and diaphoresis.  Respiratory: Negative for shortness of breath.   Cardiovascular: Positive for chest pain.  Gastrointestinal: Negative for nausea and vomiting.  Allergic/Immunologic: Negative for immunocompromised state.  Hematological: Does not bruise/bleed easily.  All other systems reviewed and are negative.    Physical Exam Updated Vital Signs BP 140/85   Pulse 81   Temp (!) 97.5 F (36.4 C) (Oral)   Resp 15   Ht 5\' 10"  (1.778 m)   Wt 72.1 kg (159 lb)   SpO2 97%   BMI 22.81 kg/m   Physical Exam  Constitutional: He is oriented to person, place, and time. He appears well-developed.  HENT:  Head: Atraumatic.  Eyes: EOM are normal.  Neck: Neck supple.  Cardiovascular: Normal rate and intact distal pulses.  Pulmonary/Chest: Effort normal. No respiratory distress.  Abdominal: Soft.  Musculoskeletal: He exhibits no edema or tenderness.  Neurological: He is alert  and oriented to person, place, and time.  Skin: Skin is warm.  Nursing note and vitals reviewed.    ED Treatments / Results  Labs (all labs ordered are listed, but only abnormal results are displayed) Labs Reviewed  BASIC METABOLIC PANEL - Abnormal; Notable for the following components:      Result Value   Sodium 127 (*)    Chloride 94 (*)    Glucose, Bld 122 (*)    All other components within normal limits  TROPONIN I  TROPONIN I  CBC WITH DIFFERENTIAL/PLATELET    EKG EKG Interpretation  Date/Time:  Saturday February 05 2018 14:16:58 EDT Ventricular Rate:  89 PR Interval:    QRS Duration: 131 QT  Interval:  385 QTC Calculation: 469 R Axis:   86 Text Interpretation:  Sinus arrhythmia Right bundle branch block ST elevation, consider inferior injury No old tracing to compare Confirmed by Varney Biles [phone]) on 02/05/2018 3:18:42 PM   Radiology No results found.  Procedures Procedures (including critical care time)  Medications Ordered in ED Medications  aspirin chewable tablet 324 mg (324 mg Oral Not Given 02/05/18 1521)     Initial Impression / Assessment and Plan / ED Course  I have reviewed the triage vital signs and the nursing notes.  Pertinent labs & imaging results that were available during my care of the patient were reviewed by me and considered in my medical decision making (see chart for details).  Clinical Course as of Feb 06 1755  Sat Feb 05, 2018  1751 Initial troponin is negative. Discussed the EKG concerns and the lack of old ekg to compare with the patients.  I informed the patient that the lack of comparison ekg makes it hard for [place] to recommend discharge, and that admission for serial troponin with a possible stress test might be the alternate, conservative approach. Pt prefers going home however. He would prefer getting outpatient workup, and to return to the ER if the symptoms return or get worse.  Strict ER return precautions have been discussed, and patient is agreeing with the plan and is comfortable with the workup done and the recommendations from the ER.    Troponin I: <0.03 [AN]    Clinical Course User Index [AN] Varney Biles, MD    Patient comes in with chief complaint of left-sided shoulder pain. Patient has history of hypertension, no history of CAD. He has known history of right bundle branch block, and EKG showing a right bundle branch block.  EKG is also showing some nonspecific ST changes -however, there is no old EKG for [place] to compare and patient at the moment is symptom-free.  Differential diagnosis for the shoulder pain does  include ACS.  Patient did some yard work yesterday, therefore this could be musculoskeletal type pain, however I am unable to reproduce pain with palpation and with movement of the shoulder at the moment -so if the etiology was musculoskeletal in nature than it has resolved on its own.  No clinical concern for impingement syndrome or neuropathic type pain.  HEAR score is 5 (2 for ekg, 1 for risk factor and 2 for age).  Plan is to get delta troponin and have a shared decision on admit vs. Discharge with strict ER return precautions and Cards f.u.  Final Clinical Impressions(s) / ED Diagnoses   Final diagnoses:  Left arm pain  Nonspecific chest pain    ED Discharge Orders    None       Nanavati,  Ankit, MD 02/05/18 2446    Varney Biles, MD 02/05/18 1757

## 2018-03-16 ENCOUNTER — Encounter: Payer: Self-pay | Admitting: Cardiovascular Disease

## 2018-03-16 ENCOUNTER — Ambulatory Visit: Payer: Medicare Other | Admitting: Cardiovascular Disease

## 2018-03-16 VITALS — BP 128/72 | HR 67 | Ht 70.0 in | Wt 155.0 lb

## 2018-03-16 DIAGNOSIS — Z9289 Personal history of other medical treatment: Secondary | ICD-10-CM | POA: Diagnosis not present

## 2018-03-16 DIAGNOSIS — I4510 Unspecified right bundle-branch block: Secondary | ICD-10-CM

## 2018-03-16 DIAGNOSIS — R9431 Abnormal electrocardiogram [ECG] [EKG]: Secondary | ICD-10-CM

## 2018-03-16 DIAGNOSIS — I10 Essential (primary) hypertension: Secondary | ICD-10-CM | POA: Diagnosis not present

## 2018-03-16 NOTE — Patient Instructions (Signed)
Medication Instructions:  Your physician recommends that you continue on your current medications as directed. Please refer to the Current Medication list given to you today.   Labwork: NONE  Testing/Procedures: NONE  Follow-Up: Your physician recommends that you schedule a follow-up appointment in: AS NEEDED      Any Other Special Instructions Will Be Listed Below (If Applicable).     If you need a refill on your cardiac medications before your next appointment, please call your pharmacy.   

## 2018-03-16 NOTE — Progress Notes (Signed)
CARDIOLOGY CONSULT NOTE  Patient ID: Luis Freeman MRN: [id number] DOB/AGE: 02-12-1933 84 y.o.  Admit date: (Not on file) Primary Physician: [name], MD Referring Physician: [name], MD  Reason for Consultation: Abnormal ECG  HPI: Luis Freeman is a 84 y.o. male who is being seen today for the evaluation of abnormal ECG at the request of [name], MD.   Past medical history includes hypertension.  He was evaluated in the ED for left shoulder and arm pain on 02/05/2018.  I reviewed all relevant documentation, labs, and studies.  I personally reviewed the ECG which demonstrated sinus rhythm with right bundle branch block.  It was reported as "inferior ST segment elevation, consider injury ".  Troponins were normal.  CBC was normal.  He was mildly hyponatremic, sodium 127.  Renal function was normal.  He feels quite well and denies exertional chest pain and shortness of breath.  He also denies palpitations, leg swelling, orthopnea, dizziness, and paroxysmal nocturnal dyspnea.    He walks 30 minutes a day.  He has some bilateral hand tingling and some tingling of his left foot.  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     No Known Allergies  Current Outpatient Medications  Medication Sig Dispense Refill  . amLODipine (NORVASC) 5 MG tablet Take 5 mg by mouth daily.      Marland Kitchen aspirin 81 MG tablet Take 81 mg by mouth daily.      . benazepril (LOTENSIN) 40 MG tablet Take 40 mg by mouth daily.      . cholecalciferol (VITAMIN D) 1000 units tablet Take 1,000 Units by mouth daily.    . cyanocobalamin (,VITAMIN B-12,) 1000 MCG/ML injection Inject 1,000 mcg into the muscle every 30 (thirty) days.   1  . hydrochlorothiazide (HYDRODIURIL) 25 MG tablet Take 25 mg by mouth daily.    . memantine (NAMENDA) 5 MG tablet Take 5 mg by mouth 2 (two) times daily.     No current facility-administered medications for this visit.     Past Medical History:  Diagnosis  Date  . Hypertension     Past Surgical History:  Procedure Laterality Date  . COLONOSCOPY    . COLONOSCOPY  02/04/2011   Procedure: COLONOSCOPY;  Surgeon: Daneil Dolin, MD;  Location: [facility];  Service: Endoscopy;  Laterality: N/A;    Social History   Socioeconomic History  . Marital status: Married    Spouse name: Not on file  . Number of children: Not on file  . Years of education: Not on file  . Highest education level: Not on file  Occupational History  . Not on file  Social Needs  . Financial resource strain: Not on file  . Food insecurity:    Worry: Not on file    Inability: Not on file  . Transportation needs:    Medical: Not on file    Non-medical: Not on file  Tobacco Use  . Smoking status: Never Smoker  . Smokeless tobacco: Never Used  Substance and Sexual Activity  . Alcohol use: Yes    Alcohol/week: 2.0 standard drinks    Types: 2 Cans of beer per week  . Drug use: No  . Sexual activity: Not on file  Lifestyle  . Physical activity:    Days per week: Not on file    Minutes per session: Not on file  . Stress: Not on file  Relationships  .  Social connections:    Talks on phone: Not on file    Gets together: Not on file    Attends religious service: Not on file    Active member of club or organization: Not on file    Attends meetings of clubs or organizations: Not on file    Relationship status: Not on file  . Intimate partner violence:    Fear of current or ex partner: Not on file    Emotionally abused: Not on file    Physically abused: Not on file    Forced sexual activity: Not on file  Other Topics Concern  . Not on file  Social History Narrative  . Not on file     No family history of premature CAD in 1st degree relatives.  Current Meds  Medication Sig  . amLODipine (NORVASC) 5 MG tablet Take 5 mg by mouth daily.    Marland Kitchen aspirin 81 MG tablet Take 81 mg by mouth daily.    . benazepril (LOTENSIN) 40 MG tablet Take 40 mg by mouth  daily.    . cholecalciferol (VITAMIN D) 1000 units tablet Take 1,000 Units by mouth daily.  . cyanocobalamin (,VITAMIN B-12,) 1000 MCG/ML injection Inject 1,000 mcg into the muscle every 30 (thirty) days.   . hydrochlorothiazide (HYDRODIURIL) 25 MG tablet Take 25 mg by mouth daily.  . memantine (NAMENDA) 5 MG tablet Take 5 mg by mouth 2 (two) times daily.      Review of systems complete and found to be negative unless listed above in HPI    Physical exam Blood pressure 128/72, pulse 67, height 5\' 10"  (1.778 m), weight 155 lb (70.3 kg), SpO2 98 %. General: NAD Neck: No JVD, no thyromegaly or thyroid nodule.  Lungs: Clear to auscultation bilaterally with normal respiratory effort. CV: Nondisplaced PMI. Regular rate and rhythm, normal S1/S2, no S3/S4, no murmur.  No peripheral edema.  No carotid bruit.    Abdomen: Soft, nontender, no distention.  Skin: Intact without lesions or rashes.  Neurologic: Alert and oriented x 3.  Psych: Normal affect. Extremities: No clubbing or cyanosis.  HEENT: Normal.   ECG: Most recent ECG reviewed.   Labs: Lab Results  Component Value Date/Time   K 3.7 02/05/2018 03:15 PM   BUN 15 02/05/2018 03:15 PM   CREATININE 0.94 02/05/2018 03:15 PM   HGB 13.9 02/05/2018 03:15 PM     Lipids: No results found for: LDLCALC, LDLDIRECT, CHOL, TRIG, HDL      ASSESSMENT AND PLAN:   1.  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  He denies any exertional symptoms to speak of.  He has good exercise tolerance for the most part.  At the present time, I do not feel noninvasive cardiac testing is indicated.  I told him that if he were to develop any concerning symptoms such as chest pain, shortness of breath, or worsening exertional fatigue, I would then consider a stress test.  The patient and his wife are in agreement with this plan.  2.  Hypertension: Controlled on present therapy which includes benazepril  and amlodipine.  No changes.   Disposition: Follow up as needed  Signed: Kate Sable, M.D., F.A.C.C.  03/16/2018, 10:14 AM

## 2018-04-04 ENCOUNTER — Emergency Department (HOSPITAL_COMMUNITY): Payer: Medicare Other

## 2018-04-04 ENCOUNTER — Emergency Department (HOSPITAL_COMMUNITY)
Admission: EM | Admit: 2018-04-04 | Discharge: 2018-04-04 | Disposition: A | Discharge status: Home / Self Care | Payer: Medicare Other | Attending: Emergency Medicine | Admitting: Emergency Medicine

## 2018-04-04 ENCOUNTER — Other Ambulatory Visit: Payer: Self-pay

## 2018-04-04 ENCOUNTER — Encounter (HOSPITAL_COMMUNITY): Payer: Self-pay | Admitting: Emergency Medicine

## 2018-04-04 DIAGNOSIS — Z79899 Other long term (current) drug therapy: Secondary | ICD-10-CM | POA: Diagnosis not present

## 2018-04-04 DIAGNOSIS — R531 Weakness: Secondary | ICD-10-CM | POA: Insufficient documentation

## 2018-04-04 DIAGNOSIS — I10 Essential (primary) hypertension: Secondary | ICD-10-CM | POA: Diagnosis not present

## 2018-04-04 DIAGNOSIS — R4701 Aphasia: Secondary | ICD-10-CM | POA: Diagnosis not present

## 2018-04-04 DIAGNOSIS — Z7982 Long term (current) use of aspirin: Secondary | ICD-10-CM | POA: Insufficient documentation

## 2018-04-04 LAB — I-STAT CHEM 8, ED
BUN: 11 mg/dL (ref 8–23)
Calcium, Ion: 1.19 mmol/L (ref 1.15–1.40)
Chloride: 93 mmol/L — ABNORMAL LOW (ref 98–111)
Creatinine, Ser: 0.8 mg/dL (ref 0.61–1.24)
Glucose, Bld: 142 mg/dL — ABNORMAL HIGH (ref 70–99)
HCT: 46 % (ref 39.0–52.0)
Hemoglobin: 15.6 g/dL (ref 13.0–17.0)
Potassium: 4.1 mmol/L (ref 3.5–5.1)
Sodium: 131 mmol/L — ABNORMAL LOW (ref 135–145)
TCO2: 26 mmol/L (ref 22–32)

## 2018-04-04 LAB — COMPREHENSIVE METABOLIC PANEL
ALT: 20 U/L (ref 0–44)
AST: 23 U/L (ref 15–41)
Albumin: 4.5 g/dL (ref 3.5–5.0)
Alkaline Phosphatase: 52 U/L (ref 38–126)
Anion gap: 7 (ref 5–15)
BUN: 11 mg/dL (ref 8–23)
CO2: 28 mmol/L (ref 22–32)
Calcium: 9.2 mg/dL (ref 8.9–10.3)
Chloride: 95 mmol/L — ABNORMAL LOW (ref 98–111)
Creatinine, Ser: 0.87 mg/dL (ref 0.61–1.24)
GFR calc Af Amer: >60 mL/min (ref >60)
GFR calc non Af Amer: >60 mL/min (ref >60)
Glucose, Bld: 148 mg/dL — ABNORMAL HIGH (ref 70–99)
Potassium: 4 mmol/L (ref 3.5–5.1)
Sodium: 130 mmol/L — ABNORMAL LOW (ref 135–145)
Total Bilirubin: 0.8 mg/dL (ref 0.3–1.2)
Total Protein: 7.7 g/dL (ref 6.5–8.1)

## 2018-04-04 LAB — CBC
HCT: 41.6 % (ref 39.0–52.0)
Hemoglobin: 14.5 g/dL (ref 13.0–17.0)
MCH: 33.3 pg (ref 26.0–34.0)
MCHC: 34.9 g/dL (ref 30.0–36.0)
MCV: 95.4 fL (ref 78.0–100.0)
Platelets: 233 10*3/uL (ref 150–400)
RBC: 4.36 MIL/uL (ref 4.22–5.81)
RDW: 12.9 % (ref 11.5–15.5)
WBC: 6 10*3/uL (ref 4.0–10.5)

## 2018-04-04 LAB — APTT: aPTT: 30 seconds (ref 24–36)

## 2018-04-04 LAB — I-STAT TROPONIN, ED: Troponin i, poc: 0 ng/mL (ref 0.00–0.08)

## 2018-04-04 LAB — DIFFERENTIAL
Basophils Absolute: 0 10*3/uL (ref 0.0–0.1)
Basophils Relative: 1 %
Eosinophils Absolute: 0 10*3/uL (ref 0.0–0.7)
Eosinophils Relative: 0 %
Lymphocytes Relative: 9 %
Lymphs Abs: 0.5 10*3/uL — ABNORMAL LOW (ref 0.7–4.0)
Monocytes Absolute: 0.5 10*3/uL (ref 0.1–1.0)
Monocytes Relative: 8 %
Neutro Abs: 5 10*3/uL (ref 1.7–7.7)
Neutrophils Relative %: 82 %

## 2018-04-04 LAB — CBG MONITORING, ED: Glucose-Capillary: 119 mg/dL — ABNORMAL HIGH (ref 70–99)

## 2018-04-04 LAB — PROTIME-INR
INR: 1.02
Prothrombin Time: 13.3 seconds (ref 11.4–15.2)

## 2018-04-04 NOTE — ED Triage Notes (Addendum)
Patient complains of slurred speech and weakness that started yesterday morning. States symptoms have occurred off and on for past 2 weeks. Most recent episode started yesterday at 11 am. Patient was sent here by Dr. Ria Comment office this morning. Patient also states that new symptoms of stuttering started yesterday at 11am and have no resolved.

## 2018-04-04 NOTE — ED Provider Notes (Signed)
[facility] EMERGENCY DEPARTMENT Provider Note   CSN: [id number] Arrival date & time: 04/04/18  1106     History   Chief Complaint Chief Complaint  Patient presents with  . Aphasia  . Weakness    HPI Luis Freeman is a 84 y.o. male presenting with a slowly progressive change in his speech pattern which wife describes a slowing of his speech with episodes of difficulty completing sentences and a more gradual decline in memory which was evaluated by MRI 5 months ago with age related changes noted only and is being treated with namenda.  Yesterday at church a member noticed a change in his speech pattern with slurring of words which wife noted was subtly different from his normal speech but is not present currently, stating was sent from church to go to a fire department by which time his symptoms had resolved.  He denies any focal weakness or any new or worsened generalized weakness, no dizziness, chest pain, new palpitations, n/v, numbness. He reports sometimes his heart rate feels fast but has been seen by cardiology and was told his heart was healthy. Denies this sx today. He states he went home, used his exercise bike and finished his day without any return of symptoms.  He stopped at his pcp's office today, did not see Dr. Willey Blade but was directed here.   The history is provided by the patient and the spouse.    Past Medical History:  Diagnosis Date  . Hypertension     There are no active problems to display for this patient.   Past Surgical History:  Procedure Laterality Date  . COLONOSCOPY    . COLONOSCOPY  02/04/2011   Procedure: COLONOSCOPY;  Surgeon: Daneil Dolin, MD;  Location: [facility];  Service: Endoscopy;  Laterality: N/A;        Home Medications    Prior to Admission medications   Medication Sig Start Date End Date Taking? Authorizing Provider  amLODipine (NORVASC) 5 MG tablet Take 5 mg by mouth daily.     Yes [provider]  aspirin 81 MG  tablet Take 81 mg by mouth daily.     Yes [provider]  benazepril (LOTENSIN) 40 MG tablet Take 40 mg by mouth daily.     Yes [provider]  cholecalciferol (VITAMIN D) 1000 units tablet Take 1,000 Units by mouth daily.   Yes [provider]  hydrochlorothiazide (HYDRODIURIL) 25 MG tablet Take 25 mg by mouth daily.   Yes [provider]  memantine (NAMENDA) 5 MG tablet Take 5 mg by mouth 2 (two) times daily.   Yes [provider]  cyanocobalamin (,VITAMIN B-12,) 1000 MCG/ML injection Inject 1,000 mcg into the muscle every 30 (thirty) days.  08/02/15   [provider]    Family History No family history on file.  Social History Social History   Tobacco Use  . Smoking status: Never Smoker  . Smokeless tobacco: Never Used  Substance Use Topics  . Alcohol use: Yes    Alcohol/week: 2.0 standard drinks    Types: 2 Cans of beer per week  . Drug use: No     Allergies   Patient has no known allergies.   Review of Systems Review of Systems  Constitutional: Negative for fever.  HENT: Negative for congestion and sore throat.   Eyes: Negative.  Negative for visual disturbance.  Respiratory: Negative for chest tightness and shortness of breath.   Cardiovascular: Negative for chest pain.  Gastrointestinal: Negative for abdominal pain, nausea and vomiting.  Genitourinary: Negative.   Musculoskeletal: Negative for arthralgias, joint swelling and neck pain.  Skin: Negative.  Negative for rash and wound.  Neurological: Positive for speech difficulty. Negative for dizziness, weakness, light-headedness, numbness and headaches.  Psychiatric/Behavioral: Negative.      Physical Exam Updated Vital Signs BP 140/82   Pulse 84   Temp 98.1 F (36.7 C)   Resp 18   SpO2 98%   Physical Exam  Constitutional: He is oriented to person, place, and time. He appears well-developed and well-nourished.  HENT:  Head: Normocephalic and  atraumatic.  Mouth/Throat: Oropharynx is clear and moist.  Eyes: Pupils are equal, round, and reactive to light. Conjunctivae and EOM are normal.  Neck: Normal range of motion.  Cardiovascular: Normal rate, regular rhythm, normal heart sounds and intact distal pulses.  Pulmonary/Chest: Effort normal and breath sounds normal. He has no wheezes.  Abdominal: Soft. Bowel sounds are normal. There is no tenderness.  Musculoskeletal: Normal range of motion.  Neurological: He is alert and oriented to person, place, and time. He has normal strength. No cranial nerve deficit or sensory deficit. Coordination and gait normal.  Equal grip strength. FROM of all extremities, no sensory or motor deficits.  Skin: Skin is warm and dry.  Psychiatric: He has a normal mood and affect.  Nursing note and vitals reviewed.    ED Treatments / Results  Labs (all labs ordered are listed, but only abnormal results are displayed) Labs Reviewed  DIFFERENTIAL - Abnormal; Notable for the following components:      Result Value   Lymphs Abs 0.5 (*)    All other components within normal limits  COMPREHENSIVE METABOLIC PANEL - Abnormal; Notable for the following components:   Sodium 130 (*)    Chloride 95 (*)    Glucose, Bld 148 (*)    All other components within normal limits  CBG MONITORING, ED - Abnormal; Notable for the following components:   Glucose-Capillary 119 (*)    All other components within normal limits  I-STAT CHEM 8, ED - Abnormal; Notable for the following components:   Sodium 131 (*)    Chloride 93 (*)    Glucose, Bld 142 (*)    All other components within normal limits  PROTIME-INR  APTT  CBC  I-STAT TROPONIN, ED    EKG None  Radiology Ct Head Wo Contrast  Result Date: 04/04/2018 CLINICAL DATA:  Slurred speech, weakness. EXAM: CT HEAD WITHOUT CONTRAST TECHNIQUE: Contiguous axial images were obtained from the base of the skull through the vertex without intravenous contrast.  COMPARISON:  MRI 11/02/2017 FINDINGS: Brain: There is atrophy and chronic small vessel disease changes. No acute intracranial abnormality. Specifically, no hemorrhage, hydrocephalus, mass lesion, acute infarction, or significant intracranial injury. Vascular: No hyperdense vessel or unexpected calcification. Skull: No acute calvarial abnormality. Sinuses/Orbits: Visualized paranasal sinuses and mastoids clear. Orbital soft tissues unremarkable. Other: None IMPRESSION: No acute intracranial abnormality. Atrophy, chronic microvascular disease. Electronically Signed   By: Rolm Baptise M.D.   On: 04/04/2018 11:55    Procedures Procedures (including critical care time)  Medications Ordered in ED Medications - No data to display   Initial Impression / Assessment and Plan / ED Course  I have reviewed the triage vital signs and the nursing notes.  Pertinent labs & imaging results that were available during my care of the patient were reviewed by me and considered in my medical decision making (see chart for details).  Pt currently asymptomatic with no appreciable deficit on todays exam.  Discussed pt's sx with Dr Willey Blade along with Ct results.  No indication at this time for neurology consult or MRI images.  Close f/u for any new or return of sx.  Will refer to pcp prn.   Pt seen by Dr Roderic Palau during todays visit.  Final Clinical Impressions(s) / ED Diagnoses   Final diagnoses:  Weakness    ED Discharge Orders    None       Landis Martins 04/04/18 1353    Milton Ferguson, MD 04/04/18 1443

## 2018-04-04 NOTE — Discharge Instructions (Addendum)
Your labs and CT imaging are negative today and your description of yesterdays symptoms do not suggest that this was a stroke.  I do recommend that you get rechecked by your doctor within the next week, but return here immediately if you have any new symptoms or return of your symptoms.

## 2018-06-30 ENCOUNTER — Ambulatory Visit (INDEPENDENT_AMBULATORY_CARE_PROVIDER_SITE_OTHER): Payer: Medicare Other | Admitting: Otolaryngology

## 2018-06-30 DIAGNOSIS — H903 Sensorineural hearing loss, bilateral: Secondary | ICD-10-CM

## 2018-10-04 ENCOUNTER — Ambulatory Visit (HOSPITAL_COMMUNITY)
Admission: RE | Admit: 2018-10-04 | Discharge: 2018-10-04 | Disposition: A | Discharge status: Home / Self Care | Payer: Medicare Other | Source: Ambulatory Visit | Attending: Internal Medicine | Admitting: Internal Medicine

## 2018-10-04 ENCOUNTER — Other Ambulatory Visit (HOSPITAL_COMMUNITY): Payer: Self-pay | Admitting: Internal Medicine

## 2018-10-04 ENCOUNTER — Other Ambulatory Visit: Payer: Self-pay

## 2018-10-04 DIAGNOSIS — M25572 Pain in left ankle and joints of left foot: Secondary | ICD-10-CM | POA: Diagnosis present

## 2018-11-08 ENCOUNTER — Telehealth: Payer: Self-pay | Admitting: Neurology

## 2018-11-08 NOTE — Telephone Encounter (Signed)
We received an urgent referral on pt for gait disorder, cognitive disorder, and confusion. I have notes if you'd like to review them. Family is requesting a face-to-face visit. Could you please advise?

## 2018-11-08 NOTE — Telephone Encounter (Signed)
According to medical records, the memory problem dates back 4 years, he underwent MRI of the brain a year ago, at that point had a 3-year history of declining memory.  If the family wishes to have a face-to-face evaluation, they may need to wait 2 or 3 months until we can see people in the office.  This does not look like a rapid onset, subacute issue.  I will be happy to review the referral notes

## 2018-11-08 NOTE — Telephone Encounter (Signed)
Referral notes placed in MD's office to review.

## 2018-11-09 NOTE — Telephone Encounter (Signed)
Nikki if this pt agrees we could do a VV. Please offer to the pt or his daughter, thanks!

## 2018-11-09 NOTE — Telephone Encounter (Signed)
Dr. Jannifer Franklin has reviewed the chart and is recommending for the safety of the pt for him to be scheduled 6-8 weeks out. Please call pt and schedule with family when available, thanks.

## 2018-11-10 ENCOUNTER — Ambulatory Visit (INDEPENDENT_AMBULATORY_CARE_PROVIDER_SITE_OTHER): Payer: Medicare Other | Admitting: Neurology

## 2018-11-10 ENCOUNTER — Other Ambulatory Visit: Payer: Self-pay

## 2018-11-10 ENCOUNTER — Encounter: Payer: Self-pay | Admitting: Neurology

## 2018-11-10 DIAGNOSIS — G301 Alzheimer's disease with late onset: Secondary | ICD-10-CM

## 2018-11-10 DIAGNOSIS — F0390 Unspecified dementia without behavioral disturbance: Secondary | ICD-10-CM | POA: Insufficient documentation

## 2018-11-10 DIAGNOSIS — R269 Unspecified abnormalities of gait and mobility: Secondary | ICD-10-CM

## 2018-11-10 DIAGNOSIS — F0280 Dementia in other diseases classified elsewhere without behavioral disturbance: Secondary | ICD-10-CM | POA: Diagnosis not present

## 2018-11-10 MED ORDER — RIVASTIGMINE TARTRATE 1.5 MG PO CAPS: 1.5000 mg | ORAL_CAPSULE | Freq: Two times a day (BID) | ORAL | 2 refills | Status: DC

## 2018-11-10 NOTE — Telephone Encounter (Signed)
I received a message from the phone room staff stating pt's daughter [name] had some information to report for the pt's virtual video visit scheduled for 9:30 am today.  She reports the pt in years past has been relatively healthy but recently has been struggling with gait, sentence construction, cognitive decline and interaction with family.  Daughter would like MD to address if his memory medication ( she thinks he is on namenda) is helping with his cognitive function and evaluate if PCP dx the pt's correctly.   I advised I would fwd as an Juluis Rainier, daughter was agreeable and voiced appreciation.
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
## 2018-11-10 NOTE — Addendum Note (Signed)
Addended by: Verlin Grills T on: 11/10/2018 09:07 AM   Modules accepted: Orders

## 2018-11-10 NOTE — Telephone Encounter (Signed)
Please refer to new patient note from today.

## 2019-01-16 ENCOUNTER — Telehealth: Payer: Self-pay | Admitting: Neurology

## 2019-01-16 ENCOUNTER — Encounter: Payer: Self-pay | Admitting: Neurology

## 2019-01-16 ENCOUNTER — Other Ambulatory Visit: Payer: Self-pay

## 2019-01-16 ENCOUNTER — Ambulatory Visit: Payer: Medicare Other | Admitting: Neurology

## 2019-01-16 VITALS — BP 168/85 | HR 76 | Temp 98.0°F | Ht 70.0 in | Wt 156.0 lb

## 2019-01-16 DIAGNOSIS — R202 Paresthesia of skin: Secondary | ICD-10-CM

## 2019-01-16 DIAGNOSIS — R413 Other amnesia: Secondary | ICD-10-CM | POA: Diagnosis not present

## 2019-01-16 DIAGNOSIS — E871 Hypo-osmolality and hyponatremia: Secondary | ICD-10-CM | POA: Diagnosis not present

## 2019-01-16 NOTE — Telephone Encounter (Signed)
[employer] medicare order sent to [facility]. No auth they will reach out to the patient to schedule.  
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
## 2019-01-17 NOTE — Addendum Note (Signed)
Addended by: Kathrynn Ducking on: 01/17/2019 11:12 AM   Modules accepted: Orders

## 2019-01-17 NOTE — Telephone Encounter (Signed)
Patient would like his MRI done at [facility]. When you get a chance can you put a new order in for [facility].

## 2019-01-17 NOTE — Telephone Encounter (Signed)
Patient is scheduled at [facility] for 01/27/19 arrival time is 9:30 AM. I left a voicemail for patient to be aware and I also left their number of [phone] if he needed to r/s for any reason.

## 2019-01-17 NOTE — Telephone Encounter (Signed)
I will place the order for [facility].

## 2019-01-18 ENCOUNTER — Telehealth: Payer: Self-pay | Admitting: Neurology

## 2019-01-18 LAB — COMPREHENSIVE METABOLIC PANEL
ALT: 17 IU/L (ref 0–44)
AST: 21 IU/L (ref 0–40)
Albumin/Globulin Ratio: 1.8 (ref 1.2–2.2)
Albumin: 4.9 g/dL — ABNORMAL HIGH (ref 3.6–4.6)
Alkaline Phosphatase: 68 IU/L (ref 39–117)
BUN/Creatinine Ratio: 14 (ref 10–24)
BUN: 11 mg/dL (ref 8–27)
Bilirubin Total: 0.5 mg/dL (ref 0.0–1.2)
CO2: 26 mmol/L (ref 20–29)
Calcium: 9.7 mg/dL (ref 8.6–10.2)
Chloride: 88 mmol/L — ABNORMAL LOW (ref 96–106)
Creatinine, Ser: 0.78 mg/dL (ref 0.76–1.27)
GFR calc Af Amer: 96 mL/min/{1.73_m2} (ref >59)
GFR calc non Af Amer: 83 mL/min/{1.73_m2} (ref >59)
Globulin, Total: 2.7 g/dL (ref 1.5–4.5)
Glucose: 101 mg/dL — ABNORMAL HIGH (ref 65–99)
Potassium: 4.8 mmol/L (ref 3.5–5.2)
Sodium: 128 mmol/L — ABNORMAL LOW (ref 134–144)
Total Protein: 7.6 g/dL (ref 6.0–8.5)

## 2019-01-18 LAB — SEDIMENTATION RATE: Sed Rate: 2 mm/hr (ref 0–30)

## 2019-01-18 LAB — RPR: RPR Ser Ql: NONREACTIVE

## 2019-01-18 LAB — TSH: TSH: 3.01 u[IU]/mL (ref 0.450–4.500)

## 2019-01-18 LAB — COPPER, SERUM: Copper: 89 ug/dL (ref 72–166)

## 2019-01-18 NOTE — Telephone Encounter (Signed)
I called the patient.  The blood work continues to show hyponatremia with hypochloremia, possibly related to use of hydrochlorothiazide diuretic.  Thyroid profile was unremarkable.  I will send blood work report to the primary care physician, the patient may need to come off of his diuretic.

## 2019-01-27 ENCOUNTER — Ambulatory Visit (HOSPITAL_COMMUNITY)
Admission: RE | Admit: 2019-01-27 | Discharge: 2019-01-27 | Disposition: A | Discharge status: Home / Self Care | Payer: Medicare Other | Source: Ambulatory Visit | Attending: Neurology | Admitting: Neurology

## 2019-01-27 ENCOUNTER — Other Ambulatory Visit: Payer: Self-pay

## 2019-01-27 DIAGNOSIS — R202 Paresthesia of skin: Secondary | ICD-10-CM

## 2019-01-29 ENCOUNTER — Telehealth: Payer: Self-pay | Admitting: Neurology

## 2019-01-29 NOTE — Telephone Encounter (Signed)
I called the patient.  The MRI of the cervical spine does not show evidence of cord compression that would explain paresthesias on all 4 extremities.  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  If they are amenable to a referral for second opinion, they are to contact our office.   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 Moderately severe left and mild right foraminal narrowing are also seen at this level.  Shallow broad-based central protrusion effaces the ventral thecal sac at C5-6 where moderately severe bilateral foraminal narrowing is identified.  Moderately severe to severe bilateral foraminal narrowing at C6-7 is worse on the right. The central canal is open at this level.

## 2019-01-31 NOTE — Telephone Encounter (Signed)
Pt daughter Jeannine Kitten [facility]) returned call to discuss MRI   CB# [phone]

## 2019-01-31 NOTE — Telephone Encounter (Signed)
I called and talk with the daughter.  MRI of the brain and cervical spine do not show an etiology for his speech changes, balance changes, and dementia.  I have recommended a second opinion through [facility] with Dr. Deboraha Sprang, if they are amenable to this, I will be happy to get this evaluation set up.

## 2019-04-03 ENCOUNTER — Telehealth: Payer: Self-pay | Admitting: *Deleted

## 2019-04-03 DIAGNOSIS — G259 Extrapyramidal and movement disorder, unspecified: Secondary | ICD-10-CM

## 2019-04-03 NOTE — Addendum Note (Signed)
Addended by: Kathrynn Ducking on: 04/03/2019 12:31 PM   Modules accepted: Orders

## 2019-04-03 NOTE — Telephone Encounter (Signed)
A referral to Dr. Linus Mako will be made.

## 2019-04-03 NOTE — Telephone Encounter (Signed)
[name] daughter Wonda Cheng (on [place]) called back and advised the pt has decided to proceed with a referral to Dr. Donna Christen office at [facility]. They called the office and his  Current availability is not until next June however they would like to proceed with referral and get on his wait list. I informed her I would send a message to Dr. Jannifer Franklin. She verbalized appreciation and her questions were answered.

## 2019-05-18 ENCOUNTER — Encounter: Payer: Self-pay | Admitting: Neurology

## 2019-05-18 ENCOUNTER — Ambulatory Visit: Payer: Medicare Other | Admitting: Neurology

## 2019-05-18 ENCOUNTER — Other Ambulatory Visit: Payer: Self-pay

## 2019-05-18 DIAGNOSIS — G259 Extrapyramidal and movement disorder, unspecified: Secondary | ICD-10-CM | POA: Diagnosis not present

## 2019-05-18 NOTE — Progress Notes (Signed)
Reason for visit: Movement disorder  Luis Freeman is an 85 y.o. male  History of present illness:  Luis Freeman is a an 85 year old right-handed white male with a history of an unusual movement disorder.  The patient has developed choreoathetotic movements of the arms bilaterally, he has significant issues with visual tracking in all planes, he has developed a nonfluent dysarthria, he denies any difficulty with swallowing.  He reports some numbness on all fours, MRI of the cervical spine did not show an etiology for this.  The patient has not had any falls.  He is felt to have a memory disorder, he is on Namenda.  The patient has had some slowing of his physical abilities, he is shuffling his feet more.  He has some discomfort in the thighs bilaterally, he uses a topical lidocaine ointment for this.  The patient was set up to see Dr. Linus Mako at [facility], but they cannot get an appointment until January 2022.  The patient returns for further evaluation.  Past Medical History:  Diagnosis Date  . Chronic low back pain   . Dementia ([place])   . Gait disorder   . Hypertension   . Vitamin B12 deficiency     Past Surgical History:  Procedure Laterality Date  . CATARACT EXTRACTION, BILATERAL    . COLONOSCOPY    . COLONOSCOPY  02/04/2011   Procedure: COLONOSCOPY;  Surgeon: Daneil Dolin, MD;  Location[facility];  Service: Endoscopy;  Laterality: N/A;    Family History  Problem Relation Age of Onset  . Diabetes Mother   . Cancer Father   . Cancer - Lung Brother     Social history:  reports that he has never smoked. He has never used smokeless tobacco. He reports current alcohol use of about 2.0 standard drinks of alcohol per week. He reports that he does not use drugs.   No Known Allergies  Medications:  Prior to Admission medications   Medication Sig Start Date End Date Taking? Authorizing Provider  amLODipine (NORVASC) 5 MG tablet Take 5 mg by mouth daily.     Yes [provider]  aspirin 81 MG tablet Take 81 mg by mouth daily.     Yes [provider]  benazepril (LOTENSIN) 40 MG tablet Take 40 mg by mouth daily.     Yes [provider]  cholecalciferol (VITAMIN D) 1000 units tablet Take 1,000 Units by mouth daily.   Yes [provider]  cyanocobalamin (,VITAMIN B-12,) 1000 MCG/ML injection Inject 1,000 mcg into the muscle every 30 (thirty) days.  08/02/15  Yes [provider]  hydrALAZINE (APRESOLINE) 10 MG tablet Take 10 mg by mouth 2 (two) times daily. 05/11/19  Yes [provider]  memantine (NAMENDA) 10 MG tablet TK 1 T PO QD 11/04/18  Yes [provider]  Polyvinyl Alcohol-Povidone ([place] OP) Apply to eye.   Yes [provider]  UNABLE TO FIND Med Name: Compound cream Diclofenac 3&  Lidocaine 5% Menthol %   Yes [provider]    ROS:  Out of a complete 14 system review of symptoms, the patient complains only of the following symptoms, and all other reviewed systems are negative.  Speech problem Memory disturbance  Blood pressure (!) 160/86, pulse 76, temperature 97.8 F (36.6 C), temperature source Temporal, height 5' 10.5" (1.791 m), weight 151 lb 5 oz (68.6 kg).  Physical Exam  General: The patient is alert and cooperative at the time  of the examination.  Skin: No significant peripheral edema is noted.   Neurologic Exam  Mental status: The patient is alert and oriented x 3 at the time of the examination. The patient has apparent normal recent and remote memory, with an apparently normal attention span and concentration ability.   Cranial nerves: Facial symmetry is present. Speech is nonfluent, monosyllabic, dysarthric.  The patient has severe difficulty with visual tracking in the horizontal and vertical plane.  Visual fields are full.  Motor: The patient has good strength in all 4 extremities.  Sensory examination: Soft touch sensation is symmetric on the  face, arms, and legs.  Coordination: The patient has good finger-nose-finger and heel-to-shin bilaterally.  The patient has choreoathetotic type movements of the hands.  Gait and station: The patient has a fairly good ability to ambulate, he has good stride, good arm swing bilaterally.  Romberg is negative.  No drift is seen.  Reflexes: Deep tendon reflexes are symmetric.   Assessment/Plan:  1.  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  Memory disturbance  The patient is to remain on Namenda.  We will try to get him referred to [facility] to see a movement disorder specialist for a second opinion regarding his diagnosis.  I suspect he has a neurodegenerative process of some sort that is likely not treatable.  He does not have Parkinson's disease.  A form of progressive supranuclear palsy or corticobasilar degeneration does need to be considered.  The family has observed what sounds like an alien limb posturing of the right arm on occasion, I have not personally seen this.  Jill Alexanders MD 05/18/2019 10:05 AM  [facility] [address] [place], [place] [address]  Phone [phone] Fax [phone]

## 2019-06-19 ENCOUNTER — Other Ambulatory Visit: Payer: Self-pay

## 2019-06-19 ENCOUNTER — Ambulatory Visit (INDEPENDENT_AMBULATORY_CARE_PROVIDER_SITE_OTHER): Payer: Medicare Other | Admitting: Otolaryngology

## 2019-06-19 DIAGNOSIS — H9313 Tinnitus, bilateral: Secondary | ICD-10-CM | POA: Diagnosis not present

## 2019-06-19 DIAGNOSIS — H903 Sensorineural hearing loss, bilateral: Secondary | ICD-10-CM

## 2019-07-27 DIAGNOSIS — D225 Melanocytic nevi of trunk: Secondary | ICD-10-CM | POA: Diagnosis not present

## 2019-07-27 DIAGNOSIS — Z08 Encounter for follow-up examination after completed treatment for malignant neoplasm: Secondary | ICD-10-CM | POA: Diagnosis not present

## 2019-07-27 DIAGNOSIS — Z85820 Personal history of malignant melanoma of skin: Secondary | ICD-10-CM | POA: Diagnosis not present

## 2019-07-27 DIAGNOSIS — Z1283 Encounter for screening for malignant neoplasm of skin: Secondary | ICD-10-CM | POA: Diagnosis not present

## 2019-08-10 DIAGNOSIS — G259 Extrapyramidal and movement disorder, unspecified: Secondary | ICD-10-CM | POA: Diagnosis not present

## 2019-08-10 DIAGNOSIS — Z9189 Other specified personal risk factors, not elsewhere classified: Secondary | ICD-10-CM | POA: Diagnosis not present

## 2019-08-10 DIAGNOSIS — G231 Progressive supranuclear ophthalmoplegia [Steele-Richardson-Olszewski]: Secondary | ICD-10-CM | POA: Diagnosis not present

## 2019-08-10 DIAGNOSIS — R4189 Other symptoms and signs involving cognitive functions and awareness: Secondary | ICD-10-CM | POA: Diagnosis not present

## 2019-08-17 DIAGNOSIS — I10 Essential (primary) hypertension: Secondary | ICD-10-CM | POA: Diagnosis not present

## 2019-08-17 DIAGNOSIS — E871 Hypo-osmolality and hyponatremia: Secondary | ICD-10-CM | POA: Diagnosis not present

## 2019-08-17 DIAGNOSIS — R2689 Other abnormalities of gait and mobility: Secondary | ICD-10-CM | POA: Diagnosis not present

## 2019-08-21 DIAGNOSIS — E538 Deficiency of other specified B group vitamins: Secondary | ICD-10-CM | POA: Diagnosis not present

## 2019-09-04 ENCOUNTER — Telehealth: Payer: Self-pay | Admitting: Neurology

## 2019-09-04 NOTE — Telephone Encounter (Signed)
The patient was seen by Dr. 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

## 2019-09-18 DIAGNOSIS — I10 Essential (primary) hypertension: Secondary | ICD-10-CM | POA: Diagnosis not present

## 2019-09-18 DIAGNOSIS — Z79899 Other long term (current) drug therapy: Secondary | ICD-10-CM | POA: Diagnosis not present

## 2019-09-18 DIAGNOSIS — E871 Hypo-osmolality and hyponatremia: Secondary | ICD-10-CM | POA: Diagnosis not present

## 2019-09-20 DIAGNOSIS — E871 Hypo-osmolality and hyponatremia: Secondary | ICD-10-CM | POA: Diagnosis not present

## 2019-09-20 DIAGNOSIS — I10 Essential (primary) hypertension: Secondary | ICD-10-CM | POA: Diagnosis not present

## 2019-09-20 DIAGNOSIS — R2689 Other abnormalities of gait and mobility: Secondary | ICD-10-CM | POA: Diagnosis not present

## 2019-09-25 DIAGNOSIS — E538 Deficiency of other specified B group vitamins: Secondary | ICD-10-CM | POA: Diagnosis not present

## 2019-10-04 ENCOUNTER — Ambulatory Visit (HOSPITAL_COMMUNITY): Payer: Medicare PPO | Attending: Neurology | Admitting: Speech Pathology

## 2019-10-04 ENCOUNTER — Other Ambulatory Visit: Payer: Self-pay

## 2019-10-04 ENCOUNTER — Encounter (HOSPITAL_COMMUNITY): Payer: Self-pay | Admitting: Speech Pathology

## 2019-10-04 DIAGNOSIS — R41841 Cognitive communication deficit: Secondary | ICD-10-CM | POA: Insufficient documentation

## 2019-10-04 NOTE — Therapy (Signed)
Lu[place] Freeman, [place], [address] Phone: [phone]   Fax:  [phone]  Speech Language Pathology Evaluation  Patient Details  Name: Luis Freeman MRN: [id number] Date of Birth: [birth year] Referring Provider (SLP): Regis Bill, MD   Encounter Date: 10/04/2019    Past Medical History:  Diagnosis Date  . Chronic low back pain   . Dementia ([place])   . Gait disorder   . Hypertension   . Vitamin B12 deficiency     Past Surgical History:  Procedure Laterality Date  . CATARACT EXTRACTION, BILATERAL    . COLONOSCOPY    . COLONOSCOPY  02/04/2011   Procedure: COLONOSCOPY;  Surgeon: Daneil Dolin, MD;  [facility];  Service: Endoscopy;  Laterality: N/A;    There were no vitals filed for this visit.    SLP Evaluation [facility] - 10/04/19 1243      SLP Visit Information   SLP Received On  10/04/19    Referring Provider (SLP)  Regis Bill, MD    Onset Date  09/29/2019    Medical Diagnosis  Progressive supranuclear palsy      General Information   HPI  [name] is a 86 y.o. male who has a past medical history of Cancer (CMS-HCC), Dental caries, History of cataract surgery, History of ongoing treatment with alendronate (Fosamax), and Hypertension. presenting in consultation for evaluation of abnormal movements. Family began to notice changes in speech in 2018 - not completing sentences, followed by episodes of confusion, losing track of thoughts. Now having significant work finding difficulties, poor attention. Often says "yes" when he means "no", and there is some echolalia. Around the same time has had abnormal movements described as fidgeting with the hands. Right arm elevates while he walks. Has trouble with fine motor skills and walking and balance, but no falls. He shuffles. His neurologist suspected CBD or other neurodegenerative condition. He was tried on Aricept but this caused nightmares so was  stopped. Namenda has no side effects but is not clearly helping. On exam I see fairly symmetrical mild bradykinesia and rigidity, more rigidity in the neck, and ideomotor and visuospatial apraxia bilaterally. There are "fidgety" movements in the hands but no evidence of chorea (movemetns disappear when arms held out in front of him). 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 Dr. Regis Bill referred Pt for SLE and treatment.    Mobility Status  ambulatory      Balance Screen   Has the patient fallen in the past 6 months  No    Has the patient had a decrease in activity level because of a fear of falling?   No    Is the patient reluctant to leave their home because of a fear of falling?   No      Prior Functional Status   Cognitive/Linguistic Baseline  Within functional limits    Type of Home  House     Lives With  Family    Available Support  Family    Education  masters degree    Vocation  Retired      Associate Professor   Overall Cognitive Status  Impaired/Different from baseline    Area of Impairment  Attention;Memory;Following commands    Memory  Decreased short-term memory    Following Commands  Follows one step commands consistently;Follows multi-step commands inconsistently    Attention  Sustained    Sustained Attention  Impaired  Sustained Attention Impairment  Functional complex;Verbal complex    Memory  Impaired    Memory Impairment  --   recalled 3 of 5 words after 5 minute delay   Awareness  Appears intact    Behaviors  Perseveration      Auditory Comprehension   Overall Auditory Comprehension  Impaired    Yes/No Questions  Within Functional Limits    Commands  Impaired    Complex Commands  50-74% accurate    Conversation  Moderately complex    Interfering Components  Processing speed    EffectiveTechniques  Extra processing time;Repetition      Visual Recognition/Discrimination   Discrimination   Within Function Limits      Reading Comprehension   Reading Status  Within funtional limits      Expression   Primary Mode of Expression  Verbal      Verbal Expression   Overall Verbal Expression  Impaired    Initiation  No impairment    Automatic Speech  Name;Social Response;Month of year    Level of Generative/Spontaneous Verbalization  Conversation    Repetition  Impaired    Level of Impairment  Sentence level    Naming  Impairment    Responsive  76-100% accurate    Confrontation  75-100% accurate    Convergent  Not tested    Divergent  50-74% accurate    Verbal Errors  Perseveration    Pragmatics  No impairment    Effective Techniques  Semantic cues;Phonemic cues    Non-Verbal Means of Communication  Not applicable      Written Expression   Dominant Hand  Right   Pt throws with his left hand   Written Expression  Not tested      Oral Motor/Sensory Function   Overall Oral Motor/Sensory Function  Impaired    Labial ROM  Within Functional Limits    Labial Symmetry  Within Functional Limits    Labial Strength  Within Functional Limits    Labial Sensation  Within Functional Limits    Labial Coordination  Reduced    Lingual ROM  Within Functional Limits    Lingual Symmetry  Within Functional Limits    Lingual Strength  Within Functional Limits    Lingual Sensation  Within Functional Limits    Lingual Coordination  Reduced    Facial ROM  Within Functional Limits    Facial Symmetry  Within Functional Limits    Facial Strength  Within Functional Limits    Facial Sensation  Within Functional Limits    Facial Coordination  WFL    Velum  Within Functional Limits    Mandible  Within Functional Limits      Motor Speech   Overall Motor Speech  Impaired    Respiration  Within functional limits    Phonation  Normal    Resonance  Within functional limits    Articulation  Within functional limitis    Intelligibility  Intelligibility reduced    Word  75-100% accurate     Phrase  75-100% accurate    Sentence  75-100% accurate    Conversation  75-100% accurate    Motor Planning  Impaired    Level of Impairment  Word    Motor Speech Errors  Aware    Phonation  WFL      Standardized Assessments   Standardized Assessments   [place] Naming Test-2nd edition;Other Assessment   SLUMS 13/30   [place] Naming Test-2nd edition   short form11/15  SLP Short Term Goals - 10/04/19 1637      SLP SHORT TERM GOAL #1   Title  Pt will implement speech intelligibility strategies at the sentence level with 90% acc with min cues.    Baseline  cues for increased intensity and respiration; 80%    Time  4    Period  Weeks    Status  New    Target Date  11/08/19      SLP SHORT TERM GOAL #2   Title  Pt will complete moderate level word retrieval activities with 90% acc when provided mi/mod cues.    Baseline  70%    Time  4    Period  Weeks    Status  New    Target Date  11/08/19      SLP SHORT TERM GOAL #3   Title  Pt will complete screening for SPEAK OUT! treatment to see if Pt would be a good candidate.    Baseline  will complete next session    Time  2    Period  Weeks    Status  New    Target Date  11/08/19      SLP SHORT TERM GOAL #4   Title  Pt will provide verbal description of pictured activities with 90% acc for content and fluency with use of strategies and min assist.    Baseline  Pt using rote sentences at this time    Time  4    Period  Weeks    Status  New    Target Date  11/08/19      SLP SHORT TERM GOAL #5   Title  Pt will complete moderate level planning and organization tasks with 90% acc with use of strategies and mi/mod assist.    Baseline  75%    Time  4    Period  Weeks    Status  New    Target Date  11/08/19        SLP Long Term Goals - 10/04/19 1636      SLP LONG TERM GOAL #1   Title  Same as short       Plan - 10/04/19 1258    Clinical Impression Statement  Pt presents with moderate cognitive linguistic deficits  characterized by impaired short term memory (3/5 after 5 minutes), decreased attention, reduced complex problem solving and planning (overall executive functioning), impaired divergent and responsive naming, and dysarthria (slow, deliberate, mild hypophonia). The [facility] Naming Test short form was administered and Pt scored 11/15 with greater difficulty with low frequency pictured objects. Pt scored a 13/30 on the SLUMS with point reduction on mental calculations, divergent naming (7 animals in 1 minute), memory (3/5), digits in reverse, clock drawing (0/4), following verbal directions, and story recall (4/8). During picture description task, Pt provided adequate content, however sentence generation and fluency was reduced with rote/repetitive responses ("I see a ___."). Pt exhibited some ideomotor apraxia and perseverative responses during the evaluation. Oral motor examination showed mild reduced coordination of oral structures. Pt passed the Yale 3 oz swallow screen. Family reports that Pt eats slower now, but appears to consume foods and liquids without distress. Pt is right handed, however he throws with his left hand. Pt will benefit from skilled SLP in order to address the above impairments, maximize independence, and decrease burden of care. Pt was recommended to see the multidisciplinary clinic for PT/OT/SLP at [facility], however the wait list was extensive. Recommend PT/OT consults, will request  from Dr. Mickie Bail.    Speech Therapy Frequency  2x / week    Duration  4 weeks    Treatment/Interventions  Compensatory strategies;Cueing hierarchy;Functional tasks;Patient/family education;Cognitive reorganization;SLP instruction and feedback;Compensatory techniques    Potential to Achieve Goals  Good    SLP Home Exercise Plan  Pt will complete HEP as assigned to facilitate carryover of treatment strategies and techniques in home environment with assist from family as needed.    Consulted and Agree with Plan of  Care  Patient;Family member/caregiver       Patient will benefit from skilled therapeutic intervention in order to improve the following deficits and impairments:   Cognitive communication deficit    Problem List Patient Active Problem List   Diagnosis Date Noted  . Movement disorder 05/18/2019  . Dementia [facility]) 11/10/2018   Thank you,  Genene Churn, [place]  [facility] 10/09/2019, 4:38 PM  [place] [address] [place], [place], [address] Phone: [phone]   Fax:  [phone]  Name: Luis Freeman MRN: [id number] Date of Birth: 1933/02/12

## 2019-10-06 ENCOUNTER — Ambulatory Visit
Admission: EM | Admit: 2019-10-06 | Discharge: 2019-10-06 | Disposition: A | Discharge status: Home / Self Care | Payer: Medicare PPO | Source: Home / Self Care

## 2019-10-06 ENCOUNTER — Encounter (HOSPITAL_COMMUNITY): Payer: Self-pay | Admitting: Emergency Medicine

## 2019-10-06 ENCOUNTER — Other Ambulatory Visit: Payer: Self-pay

## 2019-10-06 ENCOUNTER — Emergency Department (HOSPITAL_COMMUNITY): Payer: Medicare PPO

## 2019-10-06 ENCOUNTER — Emergency Department (HOSPITAL_COMMUNITY)
Admission: EM | Admit: 2019-10-06 | Discharge: 2019-10-06 | Disposition: A | Discharge status: Home / Self Care | Payer: Medicare PPO | Attending: Emergency Medicine | Admitting: Emergency Medicine

## 2019-10-06 DIAGNOSIS — W19XXXA Unspecified fall, initial encounter: Secondary | ICD-10-CM | POA: Diagnosis not present

## 2019-10-06 DIAGNOSIS — S51011A Laceration without foreign body of right elbow, initial encounter: Secondary | ICD-10-CM

## 2019-10-06 DIAGNOSIS — Y999 Unspecified external cause status: Secondary | ICD-10-CM | POA: Insufficient documentation

## 2019-10-06 DIAGNOSIS — Y939 Activity, unspecified: Secondary | ICD-10-CM | POA: Diagnosis not present

## 2019-10-06 DIAGNOSIS — I10 Essential (primary) hypertension: Secondary | ICD-10-CM | POA: Insufficient documentation

## 2019-10-06 DIAGNOSIS — S59901A Unspecified injury of right elbow, initial encounter: Secondary | ICD-10-CM | POA: Diagnosis present

## 2019-10-06 DIAGNOSIS — Z79899 Other long term (current) drug therapy: Secondary | ICD-10-CM | POA: Insufficient documentation

## 2019-10-06 DIAGNOSIS — Y929 Unspecified place or not applicable: Secondary | ICD-10-CM | POA: Insufficient documentation

## 2019-10-06 DIAGNOSIS — Z23 Encounter for immunization: Secondary | ICD-10-CM | POA: Diagnosis not present

## 2019-10-06 MED ORDER — LIDOCAINE HCL (PF) 1 % IJ SOLN
INTRAMUSCULAR | Status: AC
  Filled 2019-10-06: qty 5

## 2019-10-06 MED ORDER — TETANUS-DIPHTH-ACELL PERTUSSIS 5-2.5-18.5 LF-MCG/0.5 IM SUSP
0.5000 mL | Freq: Once | INTRAMUSCULAR | Status: AC
  Administered 2019-10-06: 0.5 mL via INTRAMUSCULAR
  Filled 2019-10-06: qty 0.5

## 2019-10-06 MED ORDER — LIDOCAINE HCL (PF) 1 % IJ SOLN
5.0000 mL | Freq: Once | INTRAMUSCULAR | Status: AC
  Administered 2019-10-06: 5 mL via INTRADERMAL

## 2019-10-06 NOTE — Discharge Instructions (Addendum)
Your tetanus vaccine was updated on 10/06/2019. Please keep this in your records. You do not need another tetanus shot for 10 years.  WOUND CARE   Keep area clean and dry for 24 hours. Do not remove bandage, if applied.  After 24 hours, remove bandage and wash wound gently with mild soap and warm water. Reapply a new bandage after cleaning wound, if directed.  Continue daily cleansing with soap and water until stitches/staples are removed.  Do not apply any ointments or creams to the wound while stitches/staples are in place, as this may cause delayed healing.  Seek medical careif you experience any of the following signs of infection: Swelling, redness, pus drainage, streaking, fever >101.0 F  Seek care if you experience excessive bleeding that does not stop after 15-20 minutes of constant, firm pressure.

## 2019-10-06 NOTE — ED Notes (Signed)
Due to potential for open fx and unable to provide xray at this time, offered pt (Daughter present) to go directly to ED for x-ray and tx. Pt/family will go to ED for imaging and tx of elbow wound.

## 2019-10-06 NOTE — ED Triage Notes (Signed)
Pt fell while getting clothes out of dryer.  Landed on back and right elbow.  Penetrating laceration and swelling noted to R elbow.  Has applied a second dressing.  No active bleeding at this time.  Pt denies pain to back, hip, head.  Denies LOC.  No decrease in ROM.

## 2019-10-06 NOTE — ED Triage Notes (Signed)
Pt fell today around noon and has a laceration to the right elbow. Pt has full ROM. [name] daughter states that he was seen at urgent care and the doctor there stated she "didn't want to suture the laceration incase it was broken." bleeding controlled at this time.

## 2019-10-06 NOTE — ED Provider Notes (Signed)
[facility] EMERGENCY DEPARTMENT Provider Note   CSN: [id number] Arrival date & time: 10/06/19  1826     History Chief Complaint  Patient presents with  . Fall    Luis Freeman is a 86 y.o. male.  Who presents to the emergency department with a chief complaint of right elbow laceration.  Patient was seen at an urgent care earlier today and was sent here because they did not have the availability to x-ray his elbow.  He apparently fell about noon and hit his right elbow.  He did not hit his head or lose consciousness.  His daughter is at bedside and provides history due to dementia as the patient is not able to provide this.  There is a level 5 caveat.  His daughter is unsure of his last tetanus vaccination.  HPI     Past Medical History:  Diagnosis Date  . Chronic low back pain   . Dementia ([place])   . Gait disorder   . Hypertension   . Vitamin B12 deficiency     Patient Active Problem List   Diagnosis Date Noted  . Movement disorder 05/18/2019  . Dementia ([place]) 11/10/2018    Past Surgical History:  Procedure Laterality Date  . CATARACT EXTRACTION, BILATERAL    . COLONOSCOPY    . COLONOSCOPY  02/04/2011   Procedure: COLONOSCOPY;  Surgeon: Daneil Dolin, MD;  [facility];  Service: Endoscopy;  Laterality: N/A;       Family History  Problem Relation Age of Onset  . Diabetes Mother   . Cancer Father   . Cancer - Lung Brother     Social History   Tobacco Use  . Smoking status: Never Smoker  . Smokeless tobacco: Never Used  Substance Use Topics  . Alcohol use: Not Currently    Alcohol/week: 0.0 standard drinks  . Drug use: No    Home Medications Prior to Admission medications   Medication Sig Start Date End Date Taking? Authorizing Provider  amLODipine (NORVASC) 5 MG tablet Take 5 mg by mouth daily.      [provider]  aspirin 81 MG tablet Take 81 mg by mouth daily.      [provider]  benazepril (LOTENSIN) 40 MG tablet  Take 40 mg by mouth daily.      [provider]  cholecalciferol (VITAMIN D) 1000 units tablet Take 1,000 Units by mouth daily.    [provider]  cyanocobalamin (,VITAMIN B-12,) 1000 MCG/ML injection Inject 1,000 mcg into the muscle every 30 (thirty) days.  08/02/15   [provider]  hydrALAZINE (APRESOLINE) 10 MG tablet Take 10 mg by mouth 2 (two) times daily. 05/11/19   [provider]  memantine (NAMENDA) 10 MG tablet TK 1 T PO QD 11/04/18   [provider]  Polyvinyl Alcohol-Povidone ([place] [facility]) Apply to eye.    [provider]  UNABLE TO FIND Med Name: Compound cream Diclofenac 3&  Lidocaine 5% Menthol %    [provider]    Allergies    Patient has no known allergies.  Review of Systems   Review of Systems  Unable to perform ROS: Dementia    Physical Exam Updated Vital Signs BP (!) 160/80 (BP Location: Left Arm)   Pulse 88   Temp 98.4 F (36.9 C) (Oral)   Resp 16   Ht 5\' 11"  (1.803 m)   Wt 70.3 kg   SpO2 99%   BMI 21.62 kg/m  Physical Exam Vitals and nursing note reviewed.  Constitutional:      General: He is not in acute distress.    Appearance: He is well-developed. He is not diaphoretic.  HENT:     Head: Normocephalic and atraumatic.  Eyes:     General: No scleral icterus.    Conjunctiva/sclera: Conjunctivae normal.  Cardiovascular:     Rate and Rhythm: Normal rate and regular rhythm.     Heart sounds: Normal heart sounds.  Pulmonary:     Effort: Pulmonary effort is normal. No respiratory distress.     Breath sounds: Normal breath sounds.  Abdominal:     Palpations: Abdomen is soft.     Tenderness: There is no abdominal tenderness.  Musculoskeletal:     Cervical back: Normal range of motion and neck supple.     Comments: 2 cm laceration of the right elbow Full range of motion of the right elbow, normal strength, no tenderness on the olecranon process or condyles  Skin:    General: Skin  is warm and dry.  Neurological:     Mental Status: He is alert.  Psychiatric:        Behavior: Behavior normal.     ED Results / Procedures / Treatments   Labs (all labs ordered are listed, but only abnormal results are displayed) Labs Reviewed - No data to display  EKG None  Radiology DG Elbow Complete Right  Result Date: 10/06/2019 CLINICAL DATA:  Fall with laceration EXAM: RIGHT ELBOW - COMPLETE 3+ VIEW COMPARISON:  None. FINDINGS: No fracture or malalignment.  No significant elbow effusion. IMPRESSION: No acute osseous abnormality Electronically Signed   By: Donavan Foil M.D.   On: 10/06/2019 20:08    Procedures .Marland KitchenLaceration Repair  Date/Time: 10/06/2019 9:46 PM Performed by: Margarita Mail, PA-C Authorized by: Margarita Mail, PA-C   Consent:    Consent obtained:  Verbal   Consent given by:  Patient and healthcare agent   Risks discussed:  Infection, need for additional repair, pain, poor cosmetic result and poor wound healing   Alternatives discussed:  No treatment and delayed treatment Universal protocol:    Procedure explained and questions answered to patient or proxy's satisfaction: yes     Relevant documents present and verified: yes     Test results available and properly labeled: yes     Imaging studies available: yes     Required blood products, implants, devices, and special equipment available: yes     Site/side marked: yes     Immediately prior to procedure, a time out was called: yes     Patient identity confirmed:  Arm band Anesthesia (see MAR for exact dosages):    Anesthesia method:  Local infiltration   Local anesthetic:  Lidocaine 1% w/o epi Laceration details:    Location:  Shoulder/arm   Shoulder/arm location:  R elbow   Length (cm):  2   Depth (mm):  5 Exploration:    Wound exploration: wound explored through full range of motion and entire depth of wound probed and visualized     Contaminated: no   Treatment:    Area cleansed with:   Saline   Amount of cleaning:  Standard   Irrigation solution:  Sterile saline Skin repair:    Repair method:  Sutures   Suture size:  3-0   Wound skin closure material used: vicryl.   Suture technique:  Simple interrupted Approximation:    Approximation:  Close Post-procedure details:    Dressing:  Adhesive  bandage   Patient tolerance of procedure:  Tolerated well, no immediate complications   (including critical care time)  Medications Ordered in ED Medications  Tdap (BOOSTRIX) injection 0.5 mL (0.5 mLs Intramuscular Given 10/06/19 1957)  lidocaine (PF) (XYLOCAINE) 1 % injection 5 mL (0 mLs Intradermal Not Given 10/06/19 2042)    ED Course  I have reviewed the triage vital signs and the nursing notes.  Pertinent labs & imaging results that were available during my care of the patient were reviewed by me and considered in my medical decision making (see chart for details).    MDM Rules/Calculators/A&P                      She is here with injury to the right elbow and laceration.  I personally viewed the patient's right elbow x-ray which shows no abnormalities on my interpretation.  Tetanus vaccination updated.  Laceration repaired.  Patient tolerated procedure well and appears appropriate for discharge.  Discussed home care with the daughter at bedside and return precautions. Final Clinical Impression(s) / ED Diagnoses Final diagnoses:  None    Rx / DC Orders ED Discharge Orders    None       Margarita Mail, PA-C 10/06/19 2148    Milton Ferguson, MD 10/10/19 1030

## 2019-10-17 ENCOUNTER — Other Ambulatory Visit: Payer: Self-pay

## 2019-10-17 ENCOUNTER — Encounter (HOSPITAL_COMMUNITY): Payer: Self-pay | Admitting: Speech Pathology

## 2019-10-17 ENCOUNTER — Ambulatory Visit (HOSPITAL_COMMUNITY): Payer: Medicare PPO | Admitting: Speech Pathology

## 2019-10-17 DIAGNOSIS — R41841 Cognitive communication deficit: Secondary | ICD-10-CM

## 2019-10-17 DIAGNOSIS — H903 Sensorineural hearing loss, bilateral: Secondary | ICD-10-CM | POA: Diagnosis not present

## 2019-10-17 NOTE — Therapy (Signed)
[place], [place], [address] Phone: [phone]   Fax:  [phone]  Speech Language Pathology Treatment  Patient Details  Name: Luis Freeman MRN: [id number] Date of Birth: [birth year] Referring Provider (SLP): Regis Bill, MD   Encounter Date: 10/17/2019  End of Session - 10/17/19 1801    Visit Number  2    Number of Visits  9    Date for SLP Re-Evaluation  11/22/19    Authorization Type  [employer] PPO    Authorization - Visit Number  1    Authorization - Number of Visits  8    SLP Start Time  [id number]    SLP Stop Time   1610    SLP Time Calculation (min)  55 min    Activity Tolerance  Patient tolerated treatment well       Past Medical History:  Diagnosis Date  . Chronic low back pain   . Dementia ([place])   . Gait disorder   . Hypertension   . Vitamin B12 deficiency     Past Surgical History:  Procedure Laterality Date  . CATARACT EXTRACTION, BILATERAL    . COLONOSCOPY    . COLONOSCOPY  02/04/2011   Procedure: COLONOSCOPY;  Surgeon: Daneil Dolin, MD;  [facility];  Service: Endoscopy;  Laterality: N/A;    There were no vitals filed for this visit.  Subjective Assessment - 10/17/19 1759    Subjective  "I fell and had to get stitches."    Patient is accompained by:  Family member    Currently in Pain?  No/denies        SLP Evaluation [facility] - 10/17/19 1759      General Information   HPI  Luis Freeman is a 86 y.o. male who has a past medical history of Cancer (CMS-HCC), Dental caries, History of cataract surgery, History of ongoing treatment with alendronate (Fosamax), and Hypertension. presenting in consultation for evaluation of abnormal movements. Family began to notice changes in speech in 2018 - not completing sentences, followed by episodes of confusion, losing track of thoughts. Now having significant work finding difficulties, poor attention. Often says "yes" when he  means "no", and there is some echolalia. Around the same time has had abnormal movements described as fidgeting with the hands. Right arm elevates while he walks. Has trouble with fine motor skills and walking and balance, but no falls. He shuffles. His neurologist suspected CBD or other neurodegenerative condition. He was tried on Aricept but this caused nightmares so was stopped. Namenda has no side effects but is not clearly helping. On exam I see fairly symmetrical mild bradykinesia and rigidity, more rigidity in the neck, and ideomotor and visuospatial apraxia bilaterally. There are "fidgety" movements in the hands but no evidence of chorea (movemetns disappear when arms held out in front of him). 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 Dr. Regis Bill referred Pt for SLE and treatment.         ADULT SLP TREATMENT - 10/17/19 1759      General Information   Behavior/Cognition  Alert;Cooperative;Pleasant mood    Patient Positioning  Upright in chair    Oral care provided  N/A    HPI  Luis Freeman is a 87 y.o. male who has a past medical history of Cancer (CMS-HCC), Dental caries, History of cataract surgery, History of ongoing treatment with alendronate (Fosamax), and Hypertension. presenting  in consultation for evaluation of abnormal movements. Family began to notice changes in speech in 2018 - not completing sentences, followed by episodes of confusion, losing track of thoughts. Now having significant work finding difficulties, poor attention. Often says "yes" when he means "no", and there is some echolalia. Around the same time has had abnormal movements described as fidgeting with the hands. Right arm elevates while he walks. Has trouble with fine motor skills and walking and balance, but no falls. He shuffles. His neurologist suspected CBD or other neurodegenerative condition. He was tried on Aricept but this caused nightmares  so was stopped. Namenda has no side effects but is not clearly helping. On exam I see fairly symmetrical mild bradykinesia and rigidity, more rigidity in the neck, and ideomotor and visuospatial apraxia bilaterally. There are "fidgety" movements in the hands but no evidence of chorea (movemetns disappear when arms held out in front of him). 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 Dr. Regis Bill referred Pt for SLE and treatment.      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 SLP provided education to Pt and family members regarding ways for family to support communication needs at home (allowance for more time, assist when needed, use written cues to augment). SLP recommended that family establish good routines now and reinforce going forward.      Assessment / Recommendations / Plan   Plan  Continue with current plan of care      Progression Toward Goals   Progression toward goals  Progressing toward goals         SLP Short Term Goals - 10/17/19 1802      SLP SHORT TERM GOAL #1   Title  Pt will implement speech intelligibility strategies at the sentence level with 90% acc with min cues.    Baseline  cues for increased intensity and respiration; 80%    Time  4    Period  Weeks    Status  On-going    Target Date  11/08/19      SLP SHORT TERM GOAL #2   Title  Pt will complete moderate level word retrieval activities with 90% acc when provided mi/mod cues.    Baseline  70%    Time  4    Period  Weeks     Status  On-going    Target Date  11/08/19      SLP SHORT TERM GOAL #3   Title  Pt will complete screening for SPEAK OUT! treatment to see if Pt would be a good candidate.    Baseline  will complete next session    Time  2    Period  Weeks    Status  On-going    Target Date  11/08/19      SLP SHORT TERM GOAL #4   Title  Pt will provide verbal description of pictured activities with 90% acc for content and fluency with use of strategies and min assist.    Baseline  Pt using rote sentences at this time    Time  4    Period  Weeks  Status  On-going    Target Date  11/08/19      SLP SHORT TERM GOAL #5   Title  Pt will complete moderate level planning and organization tasks with 90% acc with use of strategies and mi/mod assist.    Baseline  75%    Time  4    Period  Weeks    Status  On-going    Target Date  11/08/19       SLP Long Term Goals - 10/09/19 1636      SLP LONG TERM GOAL #1   Title  Same as short       Plan - 10/17/19 1802    Clinical Impression Statement Pt accompanied to therapy by his wife and daughter. Pt benefited from allowance for extra time during conversation and written cues. Some perseveration noted when topics shifted and/or as session went on. Family feels that COVID isolation has exacerbated social isolation from friends and communication opportunities (he previously went to breakfast with friends). He was able to verbalize a typical daily routine now (wake up, medications on his own, breakfast, feed the birds, walk the driveway, read the newspaper, and meals. Pt does not use a pill reminder box, but takes pills out of each container daily. SLP encouraged family to ensure that this system works (his wife generally does not assist with this) and presented ideas to have written routines and photographs to assist as needed. Pt primarily benefits from allowance for extra time to communicate and self correct (for yes/no responses). We have not heard anything back  about signed referral for OT/PT consults. His daughter states that she will follow up with the neurologist. He received new hearing aids today.   Speech Therapy Frequency  2x / week    Duration  4 weeks    Treatment/Interventions  Compensatory strategies;Cueing hierarchy;Functional tasks;Patient/family education;Cognitive reorganization;SLP instruction and feedback;Compensatory techniques    Potential to Achieve Goals  Good    SLP Home Exercise Plan  Pt will complete HEP as assigned to facilitate carryover of treatment strategies and techniques in home environment with assist from family as needed.    Consulted and Agree with Plan of Care  Patient;Family member/caregiver    Family Member Consulted  Wife, [name] and daughter, Curt Bears       Patient will benefit from skilled therapeutic intervention in order to improve the following deficits and impairments:   Cognitive communication deficit    Problem List Patient Active Problem List   Diagnosis Date Noted  . Movement disorder 05/18/2019  . Dementia [facility]) 11/10/2018   Thank you,  Genene Churn, [place]  [facility] 10/17/2019, 6:03 PM  [place] [address] [place], [place], [address] Phone: [phone]   Fax:  [phone]   Name: Luis Freeman MRN: [id number] Date of Birth: 1933/02/21

## 2019-10-19 ENCOUNTER — Other Ambulatory Visit: Payer: Self-pay

## 2019-10-19 ENCOUNTER — Encounter (HOSPITAL_COMMUNITY): Payer: Self-pay | Admitting: Speech Pathology

## 2019-10-19 ENCOUNTER — Ambulatory Visit (HOSPITAL_COMMUNITY): Payer: Medicare PPO | Attending: Neurology | Admitting: Speech Pathology

## 2019-10-19 DIAGNOSIS — R2681 Unsteadiness on feet: Secondary | ICD-10-CM | POA: Diagnosis not present

## 2019-10-19 DIAGNOSIS — R29898 Other symptoms and signs involving the musculoskeletal system: Secondary | ICD-10-CM | POA: Insufficient documentation

## 2019-10-19 DIAGNOSIS — R41841 Cognitive communication deficit: Secondary | ICD-10-CM

## 2019-10-19 DIAGNOSIS — R2689 Other abnormalities of gait and mobility: Secondary | ICD-10-CM | POA: Insufficient documentation

## 2019-10-19 DIAGNOSIS — M6281 Muscle weakness (generalized): Secondary | ICD-10-CM | POA: Insufficient documentation

## 2019-10-19 NOTE — Therapy (Signed)
[place], [place], [address] Phone: [phone]   Fax:  [phone]  Speech Language Pathology Treatment  Patient Details  Name: Luis Freeman MRN: [id number] Date of Birth: [birth year] Referring Provider (SLP): Regis Bill, MD   Encounter Date: 10/19/2019  End of Session - 10/19/19 1253    Visit Number  3    Number of Visits  9    Date for SLP Re-Evaluation  11/22/19    Authorization Type  [employer] Choice PPO    Authorization - Visit Number  2    Authorization - Number of Visits  8    SLP Start Time  1132    SLP Stop Time   1220    SLP Time Calculation (min)  48 min    Activity Tolerance  Patient tolerated treatment well       Past Medical History:  Diagnosis Date  . Chronic low back pain   . Dementia ([place])   . Gait disorder   . Hypertension   . Vitamin B12 deficiency     Past Surgical History:  Procedure Laterality Date  . CATARACT EXTRACTION, BILATERAL    . COLONOSCOPY    . COLONOSCOPY  02/04/2011   Procedure: COLONOSCOPY;  Surgeon: Daneil Dolin, MD;  [facility];  Service: Endoscopy;  Laterality: N/A;    There were no vitals filed for this visit.  Subjective Assessment - 10/19/19 1210    Subjective  "I am ready."    Patient is accompained by:  Family member    Currently in Pain?  No/denies       ADULT SLP TREATMENT - 10/19/19 1243      General Information   Behavior/Cognition  Alert;Cooperative;Pleasant mood    Patient Positioning  Upright in chair    Oral care provided  N/A    HPI  Mr. Hubley is a 86 y.o. male who has a past medical history of Cancer (CMS-HCC), Dental caries, History of cataract surgery, History of ongoing treatment with alendronate (Fosamax), and Hypertension. presenting in consultation for evaluation of abnormal movements. Family began to notice changes in speech in 2018 - not completing sentences, followed by episodes of confusion, losing track  of thoughts. Now having significant work finding difficulties, poor attention. Often says "yes" when he means "no", and there is some echolalia. Around the same time has had abnormal movements described as fidgeting with the hands. Right arm elevates while he walks. Has trouble with fine motor skills and walking and balance, but no falls. He shuffles. His neurologist suspected CBD or other neurodegenerative condition. He was tried on Aricept but this caused nightmares so was stopped. Namenda has no side effects but is not clearly helping. On exam I see fairly symmetrical mild bradykinesia and rigidity, more rigidity in the neck, and ideomotor and visuospatial apraxia bilaterally. There are "fidgety" movements in the hands but no evidence of chorea (movemetns disappear when arms held out in front of him). 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 Dr. Regis Bill referred Pt for SLE and treatment.      Treatment Provided   Treatment provided  Cognitive-Linquistic      Pain Assessment   Pain Assessment  No/denies pain      Cognitive-Linquistic Treatment   Treatment focused on  Cognition;Aphasia;Dysarthria;Patient/family/caregiver education    Skilled Treatment Skilled SLP services targeting vocal intensity and word finding completed this date. Pt sustained /a/  for an average of 69-74dB for ~10 seconds after several trials to increase understanding of task. After cues and model, Pt increased vocal intensity to 80-85dB. He had difficulty completing glides and imitating intonation. His reading average was ~63 dB. He provided salient features of pictured objects with 100% acc with min cues (chicken, gloves, birdhouse). He occasionally verbalized incorrect response when given binary choice (inside/outside), but then self corrected after SLP query.      Assessment / Recommendations / Plan   Plan  Continue with current plan of care       Progression Toward Goals   Progression toward goals  Progressing toward goals       SLP Education - 10/19/19 1244    Education Details  Provided list of memory and word findning strategies, also exercises to complete at home    Person(s) Educated  Patient;Spouse;Child(ren)    Methods  Handout    Comprehension  Verbalized understanding       SLP Short Term Goals - 10/19/19 1258      SLP SHORT TERM GOAL #1   Title  Pt will implement speech intelligibility strategies at the sentence level with 90% acc with min cues.    Baseline  cues for increased intensity and respiration; 80%    Time  4    Period  Weeks    Status  On-going    Target Date  11/08/19      SLP SHORT TERM GOAL #2   Title  Pt will complete moderate level word retrieval activities with 90% acc when provided mi/mod cues.    Baseline  70%    Time  4    Period  Weeks    Status  On-going    Target Date  11/08/19      SLP SHORT TERM GOAL #3   Title  Pt will complete screening for SPEAK OUT! treatment to see if Pt would be a good candidate.    Baseline  will complete next session    Time  2    Period  Weeks    Status  On-going    Target Date  11/08/19      SLP SHORT TERM GOAL #4   Title  Pt will provide verbal description of pictured activities with 90% acc for content and fluency with use of strategies and min assist.    Baseline  Pt using rote sentences at this time    Time  4    Period  Weeks    Status  On-going    Target Date  11/08/19      SLP SHORT TERM GOAL #5   Title  Pt will complete moderate level planning and organization tasks with 90% acc with use of strategies and mi/mod assist.    Baseline  75%    Time  4    Period  Weeks    Status  On-going    Target Date  11/08/19       SLP Long Term Goals - 10/09/19 1636      SLP LONG TERM GOAL #1   Title  Same as short       Plan - 10/19/19 1254    Clinical Impression Statement  Pt accompanied by his wife and daughter. Completed short trial of  SPEAK OUT! techniques which was effective in increasing vocal intensity, however limited by apraxia. Pt benefited from repetition in order to improve accuracy of completing tasks. He continues to benefit from allowance for extra time and SLP  query after a pause for Pt to self correct as needed. Family was given a communication support template to fill out with important vocabulary. They were also given word finding exercises for homework. His hearing aides appear to be helping with communication. Continue plan of care and will use communication support template next session.    Speech Therapy Frequency  2x / week    Duration  4 weeks    Treatment/Interventions  Compensatory strategies;Cueing hierarchy;Functional tasks;Patient/family education;Cognitive reorganization;SLP instruction and feedback;Compensatory techniques    Potential to Achieve Goals  Good    SLP Home Exercise Plan  Pt will complete HEP as assigned to facilitate carryover of treatment strategies and techniques in home environment with assist from family as needed.    Consulted and Agree with Plan of Care  Patient;Family member/caregiver    Family Member Consulted  Wife, [name] and daughter, Curt Bears       Patient will benefit from skilled therapeutic intervention in order to improve the following deficits and impairments:   Cognitive communication deficit    Problem List Patient Active Problem List   Diagnosis Date Noted  . Movement disorder 05/18/2019  . Dementia [facility]) 11/10/2018   Thank you,  Genene Churn, [place]  [facility] 10/19/2019, 12:59 PM  [place] [address] [place], [place], [address] Phone: [phone]   Fax:  [phone]   Name: MAN MOM MRN: [id number] Date of Birth: 04-Aug-1932

## 2019-10-23 ENCOUNTER — Telehealth: Payer: Self-pay | Admitting: Neurology

## 2019-10-23 NOTE — Telephone Encounter (Signed)
Dr. Jannifer Franklin,  Patient has an upcoming appointment at pcp in the next week or 2 and is set to have labs done. Are there any labs that you wanted to get from him at his 4/28 appointment with you that he can have done at pcp appointment?

## 2019-10-24 ENCOUNTER — Other Ambulatory Visit: Payer: Self-pay

## 2019-10-24 ENCOUNTER — Encounter (HOSPITAL_COMMUNITY): Payer: Self-pay | Admitting: Speech Pathology

## 2019-10-24 ENCOUNTER — Ambulatory Visit (HOSPITAL_COMMUNITY): Payer: Medicare PPO | Admitting: Speech Pathology

## 2019-10-24 DIAGNOSIS — R2681 Unsteadiness on feet: Secondary | ICD-10-CM | POA: Diagnosis not present

## 2019-10-24 DIAGNOSIS — R41841 Cognitive communication deficit: Secondary | ICD-10-CM | POA: Diagnosis not present

## 2019-10-24 DIAGNOSIS — M6281 Muscle weakness (generalized): Secondary | ICD-10-CM | POA: Diagnosis not present

## 2019-10-24 DIAGNOSIS — R29898 Other symptoms and signs involving the musculoskeletal system: Secondary | ICD-10-CM | POA: Diagnosis not present

## 2019-10-24 DIAGNOSIS — R2689 Other abnormalities of gait and mobility: Secondary | ICD-10-CM | POA: Diagnosis not present

## 2019-10-24 NOTE — Therapy (Signed)
[place], [place], [address] Phone: [phone]   Fax:  [phone]  Speech Language Pathology Treatment  Patient Details  Name: Luis Freeman MRN: [id number] Date of Birth: 04/29/1933 Referring Provider (SLP): Regis Bill, MD   Encounter Date: 10/24/2019  End of Session - 10/24/19 1548    Visit Number  4    Number of Visits  9    Date for SLP Re-Evaluation  11/22/19    Authorization Type  [employer] PPO    Authorization - Visit Number  3    Authorization - Number of Visits  8    SLP Start Time  [id number]    SLP Stop Time   1515    SLP Time Calculation (min)  45 min    Activity Tolerance  Patient tolerated treatment well       Past Medical History:  Diagnosis Date  . Chronic low back pain   . Dementia ([place])   . Gait disorder   . Hypertension   . Vitamin B12 deficiency     Past Surgical History:  Procedure Laterality Date  . CATARACT EXTRACTION, BILATERAL    . COLONOSCOPY    . COLONOSCOPY  02/04/2011   Procedure: COLONOSCOPY;  Surgeon: Daneil Dolin, MD;  [facility];  Service: Endoscopy;  Laterality: N/A;    There were no vitals filed for this visit.  Subjective Assessment - 10/24/19 1546    Subjective  "I saw my great grandchildren."    Patient is accompained by:  Family member    Currently in Pain?  No/denies       ADULT SLP TREATMENT - 10/24/19 1547      General Information   Behavior/Cognition  Alert;Cooperative;Pleasant mood    Patient Positioning  Upright in chair    Oral care provided  N/A    HPI  Luis Freeman is a 86 y.o. male who has a past medical history of Cancer (CMS-HCC), Dental caries, History of cataract surgery, History of ongoing treatment with alendronate (Fosamax), and Hypertension. presenting in consultation for evaluation of abnormal movements. Family began to notice changes in speech in 2018 - not completing sentences, followed by episodes of  confusion, losing track of thoughts. Now having significant work finding difficulties, poor attention. Often says "yes" when he means "no", and there is some echolalia. Around the same time has had abnormal movements described as fidgeting with the hands. Right arm elevates while he walks. Has trouble with fine motor skills and walking and balance, but no falls. He shuffles. His neurologist suspected CBD or other neurodegenerative condition. He was tried on Aricept but this caused nightmares so was stopped. Namenda has no side effects but is not clearly helping. On exam I see fairly symmetrical mild bradykinesia and rigidity, more rigidity in the neck, and ideomotor and visuospatial apraxia bilaterally. There are "fidgety" movements in the hands but no evidence of chorea (movemetns disappear when arms held out in front of him). 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 Dr. Regis Bill referred Pt for SLE and treatment.      Treatment Provided   Treatment provided  Cognitive-Linquistic      Pain Assessment   Pain Assessment  No/denies pain      Cognitive-Linquistic Treatment   Treatment focused on  Cognition;Aphasia;Dysarthria;Patient/family/caregiver education    Skilled Treatment SLP provided skilled intervention targeting word finding skills during conversation regarding family member  names and activities from the weekend. Pt required assist from family members to accurately name family members (Pt able to provide names but relationship not always correct). He benefited from written cues and photographs to help guide him.      Assessment / Recommendations / Plan   Plan  Continue with current plan of care      Progression Toward Goals   Progression toward goals  Progressing toward goals       SLP Education - 10/24/19 1548    Education Details  Asked family to fill out communication support template    Person(s) Educated   Patient;Spouse;Child(ren)    Methods  Explanation;Demonstration    Comprehension  Verbalized understanding       SLP Short Term Goals - 10/24/19 1549      SLP SHORT TERM GOAL #1   Title  Pt will implement speech intelligibility strategies at the sentence level with 90% acc with min cues.    Baseline  cues for increased intensity and respiration; 80%    Time  4    Period  Weeks    Status  On-going    Target Date  11/08/19      SLP SHORT TERM GOAL #2   Title  Pt will complete moderate level word retrieval activities with 90% acc when provided mi/mod cues.    Baseline  70%    Time  4    Period  Weeks    Status  On-going    Target Date  11/08/19      SLP SHORT TERM GOAL #3   Title  Pt will complete screening for SPEAK OUT! treatment to see if Pt would be a good candidate.    Baseline  will complete next session    Time  2    Period  Weeks    Status  On-going    Target Date  11/08/19      SLP SHORT TERM GOAL #4   Title  Pt will provide verbal description of pictured activities with 90% acc for content and fluency with use of strategies and min assist.    Baseline  Pt using rote sentences at this time    Time  4    Period  Weeks    Status  On-going    Target Date  11/08/19      SLP SHORT TERM GOAL #5   Title  Pt will complete moderate level planning and organization tasks with 90% acc with use of strategies and mi/mod assist.    Baseline  75%    Time  4    Period  Weeks    Status  On-going    Target Date  11/08/19       SLP Long Term Goals - 10/09/19 1636      SLP LONG TERM GOAL #1   Title  Same as short       Plan - 10/24/19 1549    Clinical Impression Statement  Pt accompanied by his wife and daughter. Speech was intelligible with allowance for extra time to produce sentences. His wife didn't realize she was to fill out communication support template and will work on it over the next couple of days. Family feels that updated hearing aides are helping Pt with  communication. SLP reiterated importance of providing a quiet environment for Pt to communicate and to allow Pt extra time for processing.   Speech Therapy Frequency  2x / week    Duration  4 weeks  Treatment/Interventions  Compensatory strategies;Cueing hierarchy;Functional tasks;Patient/family education;Cognitive reorganization;SLP instruction and feedback;Compensatory techniques    Potential to Achieve Goals  Good    SLP Home Exercise Plan  Pt will complete HEP as assigned to facilitate carryover of treatment strategies and techniques in home environment with assist from family as needed.    Consulted and Agree with Plan of Care  Patient;Family member/caregiver    Family Member Consulted  Wife, [name] and daughter, Curt Bears       Patient will benefit from skilled therapeutic intervention in order to improve the following deficits and impairments:   Cognitive communication deficit    Problem List Patient Active Problem List   Diagnosis Date Noted  . Movement disorder 05/18/2019  . Dementia [facility]) 11/10/2018   Thank you,  Genene Churn, [place]  [facility] 10/24/2019, 3:50 PM  [place] [address] [place], [place], [address] Phone: [phone]   Fax:  [phone]   Name: LENN SRINIVASAN MRN: [id number] Date of Birth: [birth year]

## 2019-10-25 DIAGNOSIS — M545 Low back pain: Secondary | ICD-10-CM | POA: Diagnosis not present

## 2019-10-25 DIAGNOSIS — H9190 Unspecified hearing loss, unspecified ear: Secondary | ICD-10-CM | POA: Diagnosis not present

## 2019-10-25 DIAGNOSIS — R4189 Other symptoms and signs involving cognitive functions and awareness: Secondary | ICD-10-CM | POA: Diagnosis not present

## 2019-10-25 DIAGNOSIS — I4891 Unspecified atrial fibrillation: Secondary | ICD-10-CM | POA: Diagnosis not present

## 2019-10-25 DIAGNOSIS — E538 Deficiency of other specified B group vitamins: Secondary | ICD-10-CM | POA: Diagnosis not present

## 2019-10-25 DIAGNOSIS — R609 Edema, unspecified: Secondary | ICD-10-CM | POA: Diagnosis not present

## 2019-10-25 DIAGNOSIS — G259 Extrapyramidal and movement disorder, unspecified: Secondary | ICD-10-CM | POA: Diagnosis not present

## 2019-10-25 DIAGNOSIS — Z6821 Body mass index (BMI) 21.0-21.9, adult: Secondary | ICD-10-CM | POA: Diagnosis not present

## 2019-10-25 NOTE — Telephone Encounter (Signed)
I do not need any specific labs.

## 2019-10-26 ENCOUNTER — Other Ambulatory Visit: Payer: Self-pay

## 2019-10-26 ENCOUNTER — Ambulatory Visit (HOSPITAL_COMMUNITY): Payer: Medicare PPO | Admitting: Speech Pathology

## 2019-10-26 ENCOUNTER — Encounter (HOSPITAL_COMMUNITY): Payer: Self-pay | Admitting: Speech Pathology

## 2019-10-26 DIAGNOSIS — R41841 Cognitive communication deficit: Secondary | ICD-10-CM

## 2019-10-26 DIAGNOSIS — R2681 Unsteadiness on feet: Secondary | ICD-10-CM | POA: Diagnosis not present

## 2019-10-26 DIAGNOSIS — R2689 Other abnormalities of gait and mobility: Secondary | ICD-10-CM | POA: Diagnosis not present

## 2019-10-26 DIAGNOSIS — R29898 Other symptoms and signs involving the musculoskeletal system: Secondary | ICD-10-CM | POA: Diagnosis not present

## 2019-10-26 DIAGNOSIS — M6281 Muscle weakness (generalized): Secondary | ICD-10-CM | POA: Diagnosis not present

## 2019-10-26 NOTE — Telephone Encounter (Signed)
If pt or family calls back tell them per Dr.WIllis he does not need any labs on pt at this time  Vm was left for pt to call back.

## 2019-10-26 NOTE — Therapy (Signed)
[place], [place], [address] Phone: [phone]   Fax:  [phone]  Speech Language Pathology Treatment  Patient Details  Name: Luis Freeman MRN: [id number] Date of Birth: May 31, 1933 Referring Provider (SLP): Regis Bill, MD   Encounter Date: 10/26/2019  End of Session - 10/26/19 1344    Visit Number  5    Number of Visits  9    Date for SLP Re-Evaluation  11/22/19    Authorization Type  [employer] PPO    Authorization - Visit Number  4    Authorization - Number of Visits  8    SLP Start Time  0945    SLP Stop Time   1030    SLP Time Calculation (min)  45 min    Activity Tolerance  Patient tolerated treatment well       Past Medical History:  Diagnosis Date  . Chronic low back pain   . Dementia ([place])   . Gait disorder   . Hypertension   . Vitamin B12 deficiency     Past Surgical History:  Procedure Laterality Date  . CATARACT EXTRACTION, BILATERAL    . COLONOSCOPY    . COLONOSCOPY  02/04/2011   Procedure: COLONOSCOPY;  Surgeon: Daneil Dolin, MD;  [facility];  Service: Endoscopy;  Laterality: N/A;    There were no vitals filed for this visit.  Subjective Assessment - 10/26/19 1336    Subjective  "There is a new baby."    Patient is accompained by:  Family member    Currently in Pain?  No/denies       ADULT SLP TREATMENT - 10/26/19 1336      General Information   Behavior/Cognition  Alert;Cooperative;Pleasant mood    Patient Positioning  Upright in chair    Oral care provided  N/A    HPI  Mr. Luis Freeman is a 86 y.o. male who has a past medical history of Cancer (CMS-HCC), Dental caries, History of cataract surgery, History of ongoing treatment with alendronate (Fosamax), and Hypertension. presenting in consultation for evaluation of abnormal movements. Family began to notice changes in speech in 2018 - not completing sentences, followed by episodes of confusion,  losing track of thoughts. Now having significant work finding difficulties, poor attention. Often says "yes" when he means "no", and there is some echolalia. Around the same time has had abnormal movements described as fidgeting with the hands. Right arm elevates while he walks. Has trouble with fine motor skills and walking and balance, but no falls. He shuffles. His neurologist suspected CBD or other neurodegenerative condition. He was tried on Aricept but this caused nightmares so was stopped. Namenda has no side effects but is not clearly helping. On exam I see fairly symmetrical mild bradykinesia and rigidity, more rigidity in the neck, and ideomotor and visuospatial apraxia bilaterally. There are "fidgety" movements in the hands but no evidence of chorea (movemetns disappear when arms held out in front of him). 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 Dr. Regis Bill referred Pt for SLE and treatment.      Treatment Provided   Treatment provided  Cognitive-Linquistic      Pain Assessment   Pain Assessment  No/denies pain      Cognitive-Linquistic Treatment   Treatment focused on  Cognition;Aphasia;Dysarthria;Patient/family/caregiver education    Skilled Treatment  Skilled SLP services targeting verbal expression and processing. Speech intelligibility continues to  be WFL with the exception of a few words in conversation. SLP asked Pt personally relevant questions regarding preferences and favorites and Pt then cued to ask SLP the same questions. Pt responded to Wh- questions with 100% acc when allowed extra time to answer. He had more difficulty anwering open ended questions which required Pt to supply additional details. SLP wrote a template of: Who, what, when, where, and why and assisted Pt in answering those questions and then summarizing a complete response (question related to how he met his wife). He needed reminders ("Do  you have a question for me?") to ask SLP questions.      Assessment / Recommendations / Plan   Plan  Continue with current plan of care      Progression Toward Goals   Progression toward goals  Progressing toward goals         SLP Short Term Goals - 10/26/19 1349      SLP SHORT TERM GOAL #1   Title  Pt will implement speech intelligibility strategies at the sentence level with 90% acc with min cues.    Baseline  cues for increased intensity and respiration; 80%    Time  4    Period  Weeks    Status  On-going    Target Date  11/08/19      SLP SHORT TERM GOAL #2   Title  Pt will complete moderate level word retrieval activities with 90% acc when provided mi/mod cues.    Baseline  70%    Time  4    Period  Weeks    Status  On-going    Target Date  11/08/19      SLP SHORT TERM GOAL #3   Title  Pt will complete screening for SPEAK OUT! treatment to see if Pt would be a good candidate.    Baseline  will complete next session    Time  2    Period  Weeks    Status  Achieved    Target Date  11/08/19      SLP SHORT TERM GOAL #4   Title  Pt will provide verbal description of pictured activities with 90% acc for content and fluency with use of strategies and min assist.    Baseline  Pt using rote sentences at this time    Time  4    Period  Weeks    Status  On-going    Target Date  11/08/19      SLP SHORT TERM GOAL #5   Title  Pt will complete moderate level planning and organization tasks with 90% acc with use of strategies and mi/mod assist.    Baseline  75%    Time  4    Period  Weeks    Status  On-going    Target Date  11/08/19       SLP Long Term Goals - 10/09/19 1636      SLP LONG TERM GOAL #1   Title  Same as short       Plan - 10/26/19 1344    Clinical Impression Statement  Pt accompanied to therapy by his wife, Luis Freeman, and his daughter, Luis Freeman. Caregiver education was provided throughout the session by demonstrating communication strategies and appropriate  cueing. Pt benefited from allowance for extra time, rephrasing, and queries ("Did your wife teach at a college or at a high school?"). Pt generally able to self correct responses when offered a binary choice. SLP continued to encourage  establishing routines at home and adding to communication support template. Orders for PT/OT received. SLP will continue to provide education on best communication strategies to use at home.    Speech Therapy Frequency  2x / week    Duration  4 weeks    Treatment/Interventions  Compensatory strategies;Cueing hierarchy;Functional tasks;Patient/family education;Cognitive reorganization;SLP instruction and feedback;Compensatory techniques    Potential to Achieve Goals  Good    SLP Home Exercise Plan  Pt will complete HEP as assigned to facilitate carryover of treatment strategies and techniques in home environment with assist from family as needed.    Consulted and Agree with Plan of Care  Patient;Family member/caregiver    Family Member Consulted  Wife, Luis Freeman and daughter, Luis Freeman       Patient will benefit from skilled therapeutic intervention in order to improve the following deficits and impairments:   Cognitive communication deficit    Problem List Patient Active Problem List   Diagnosis Date Noted  . Movement disorder 05/18/2019  . Dementia [facility]) 11/10/2018   Thank you,  Genene Churn, [place]  [facility] 10/26/2019, 1:50 PM  [place] [address] [place], [place], [address] Phone: [phone]   Fax:  [phone]   Name: ELIASAR HLAVATY MRN: [id number] Date of Birth: 1932-08-05

## 2019-10-30 DIAGNOSIS — E538 Deficiency of other specified B group vitamins: Secondary | ICD-10-CM | POA: Diagnosis not present

## 2019-10-31 ENCOUNTER — Ambulatory Visit (HOSPITAL_COMMUNITY): Payer: Medicare PPO | Admitting: Speech Pathology

## 2019-10-31 ENCOUNTER — Other Ambulatory Visit: Payer: Self-pay

## 2019-10-31 ENCOUNTER — Encounter (HOSPITAL_COMMUNITY): Payer: Self-pay | Admitting: Speech Pathology

## 2019-10-31 DIAGNOSIS — R29898 Other symptoms and signs involving the musculoskeletal system: Secondary | ICD-10-CM | POA: Diagnosis not present

## 2019-10-31 DIAGNOSIS — R41841 Cognitive communication deficit: Secondary | ICD-10-CM

## 2019-10-31 DIAGNOSIS — R2689 Other abnormalities of gait and mobility: Secondary | ICD-10-CM | POA: Diagnosis not present

## 2019-10-31 DIAGNOSIS — R2681 Unsteadiness on feet: Secondary | ICD-10-CM | POA: Diagnosis not present

## 2019-10-31 DIAGNOSIS — M6281 Muscle weakness (generalized): Secondary | ICD-10-CM | POA: Diagnosis not present

## 2019-10-31 NOTE — Therapy (Signed)
[place], [place], [address] Phone: [phone]   Fax:  [phone]  Speech Language Pathology Treatment  Patient Details  Name: Luis Freeman MRN: [id number] Date of Birth: 06/24/1933 Referring Provider (SLP): Regis Bill, MD   Encounter Date: 10/31/2019  End of Session - 10/31/19 1533    Visit Number  6    Number of Visits  9    Date for SLP Re-Evaluation  11/22/19    Authorization Type  [employer] PPO    Authorization - Visit Number  5    Authorization - Number of Visits  8    SLP Start Time  [id number]    SLP Stop Time   1601    SLP Time Calculation (min)  45 min    Activity Tolerance  Patient tolerated treatment well       Past Medical History:  Diagnosis Date  . Chronic low back pain   . Dementia ([place])   . Gait disorder   . Hypertension   . Vitamin B12 deficiency     Past Surgical History:  Procedure Laterality Date  . CATARACT EXTRACTION, BILATERAL    . COLONOSCOPY    . COLONOSCOPY  02/04/2011   Procedure: COLONOSCOPY;  Surgeon: Daneil Dolin, MD;  [facility];  Service: Endoscopy;  Laterality: N/A;    There were no vitals filed for this visit.  Subjective Assessment - 10/31/19 1521    Subjective  "How are you?"    Patient is accompained by:  Family member    Currently in Pain?  No/denies        ADULT SLP TREATMENT - 10/31/19 1529      General Information   Behavior/Cognition  Alert;Cooperative;Pleasant mood    Patient Positioning  Upright in chair    Oral care provided  N/A    HPI  Luis Freeman is a 86 y.o. male who has a past medical history of Cancer (CMS-HCC), Dental caries, History of cataract surgery, History of ongoing treatment with alendronate (Fosamax), and Hypertension. presenting in consultation for evaluation of abnormal movements. Family began to notice changes in speech in 2018 - not completing sentences, followed by episodes of confusion, losing  track of thoughts. Now having significant work finding difficulties, poor attention. Often says "yes" when he means "no", and there is some echolalia. Around the same time has had abnormal movements described as fidgeting with the hands. Right arm elevates while he walks. Has trouble with fine motor skills and walking and balance, but no falls. He shuffles. His neurologist suspected CBD or other neurodegenerative condition. He was tried on Aricept but this caused nightmares so was stopped. Namenda has no side effects but is not clearly helping. On exam I see fairly symmetrical mild bradykinesia and rigidity, more rigidity in the neck, and ideomotor and visuospatial apraxia bilaterally. There are "fidgety" movements in the hands but no evidence of chorea (movemetns disappear when arms held out in front of him). 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 Dr. Regis Bill referred Pt for SLE and treatment.      Treatment Provided   Treatment provided  Cognitive-Linquistic      Pain Assessment   Pain Assessment  No/denies pain      Cognitive-Linquistic Treatment   Treatment focused on  Cognition;Aphasia;Dysarthria;Patient/family/caregiver education    Skilled Treatment  Skilled SLP services targeting verbal expression and processing. Speech intelligibility continues to be  WFL with the exception of a few words in conversation.  Pt responded to Wh- questions with 100% acc with min cues when allowed extra time to answer. He answered binary choice questions when used as a query for incorrect responses with 80% acc. He stated the function of objects with 100% acc when provided with min cues. Occasional perseverative responses ("computer"), but Pt redirected with written cues.      Assessment / Recommendations / Plan   Plan  Continue with current plan of care      Progression Toward Goals   Progression toward goals  Progressing toward goals          SLP Short Term Goals - 10/31/19 1539      SLP SHORT TERM GOAL #1   Title  Pt will implement speech intelligibility strategies at the sentence level with 90% acc with min cues.    Baseline  cues for increased intensity and respiration; 80%    Time  4    Period  Weeks    Status  On-going    Target Date  11/08/19      SLP SHORT TERM GOAL #2   Title  Pt will complete moderate level word retrieval activities with 90% acc when provided mi/mod cues.    Baseline  70%    Time  4    Period  Weeks    Status  On-going    Target Date  11/08/19      SLP SHORT TERM GOAL #3   Title  Pt will complete screening for SPEAK OUT! treatment to see if Pt would be a good candidate.    Baseline  will complete next session    Time  2    Period  Weeks    Status  Achieved    Target Date  11/08/19      SLP SHORT TERM GOAL #4   Title  Pt will provide verbal description of pictured activities with 90% acc for content and fluency with use of strategies and min assist.    Baseline  Pt using rote sentences at this time    Time  4    Period  Weeks    Status  On-going    Target Date  11/08/19      SLP SHORT TERM GOAL #5   Title  Pt will complete moderate level planning and organization tasks with 90% acc with use of strategies and mi/mod assist.    Baseline  75%    Time  4    Period  Weeks    Status  On-going    Target Date  11/08/19       SLP Long Term Goals - 10/09/19 1636      SLP LONG TERM GOAL #1   Title  Same as short       Plan - 10/31/19 1538    Clinical Impression Statement  Pt accompanied to therapy by his wife, Luis Freeman, and his daughter, Luis Freeman. Caregiver education was provided throughout the session by demonstrating communication strategies and appropriate cueing. Pt benefited from allowance for extra time, rephrasing, and queries ("Are daffodils yellow or red?"). Pt generally able to self correct responses when offered a binary choice. SLP will continue to provide education  on best communication strategies to use at home.    Speech Therapy Frequency  2x / week    Duration  4 weeks    Treatment/Interventions  Compensatory strategies;Cueing hierarchy;Functional tasks;Patient/family education;Cognitive reorganization;SLP instruction and feedback;Compensatory techniques  Potential to Achieve Goals  Good    SLP Home Exercise Plan  Pt will complete HEP as assigned to facilitate carryover of treatment strategies and techniques in home environment with assist from family as needed.    Consulted and Agree with Plan of Care  Patient;Family member/caregiver    Family Member Consulted  Wife, Luis Freeman and daughter, Luis Freeman       Patient will benefit from skilled therapeutic intervention in order to improve the following deficits and impairments:   Cognitive communication deficit    Problem List Patient Active Problem List   Diagnosis Date Noted  . Movement disorder 05/18/2019  . Dementia ([place]) 11/10/2018    Luis Freeman 10/31/2019, 4:30 PM  [place] [address] [place], [place], [address] Phone: [phone]   Fax:  [phone]   Name: Luis Freeman MRN: [id number] Date of Birth: 01-May-1933

## 2019-11-01 ENCOUNTER — Ambulatory Visit (HOSPITAL_COMMUNITY): Payer: Medicare PPO | Admitting: Physical Therapy

## 2019-11-02 ENCOUNTER — Ambulatory Visit (HOSPITAL_COMMUNITY): Payer: Medicare PPO | Admitting: Speech Pathology

## 2019-11-02 ENCOUNTER — Encounter (HOSPITAL_COMMUNITY): Payer: Self-pay | Admitting: Physical Therapy

## 2019-11-02 ENCOUNTER — Encounter (HOSPITAL_COMMUNITY): Payer: Self-pay | Admitting: Speech Pathology

## 2019-11-02 ENCOUNTER — Ambulatory Visit (HOSPITAL_COMMUNITY): Payer: Medicare PPO | Admitting: Physical Therapy

## 2019-11-02 ENCOUNTER — Other Ambulatory Visit: Payer: Self-pay

## 2019-11-02 DIAGNOSIS — R2681 Unsteadiness on feet: Secondary | ICD-10-CM | POA: Diagnosis not present

## 2019-11-02 DIAGNOSIS — R41841 Cognitive communication deficit: Secondary | ICD-10-CM | POA: Diagnosis not present

## 2019-11-02 DIAGNOSIS — R2689 Other abnormalities of gait and mobility: Secondary | ICD-10-CM

## 2019-11-02 DIAGNOSIS — R29898 Other symptoms and signs involving the musculoskeletal system: Secondary | ICD-10-CM | POA: Diagnosis not present

## 2019-11-02 DIAGNOSIS — M6281 Muscle weakness (generalized): Secondary | ICD-10-CM | POA: Diagnosis not present

## 2019-11-02 NOTE — Therapy (Signed)
[place], [place], [address] Phone: [phone]   Fax:  [phone]  Speech Language Pathology Treatment  Patient Details  Name: Luis Freeman MRN: [id number] Date of Birth: August 09, 1932 Referring Provider (SLP): Regis Bill, MD   Encounter Date: 11/02/2019  End of Session - 11/02/19 1053    Visit Number  7    Number of Visits  9    Date for SLP Re-Evaluation  11/22/19    Authorization Type  [employer] Choice PPO    Authorization - Visit Number  6    Authorization - Number of Visits  8    SLP Start Time  [phone]    SLP Stop Time   [id number]    SLP Time Calculation (min)  45 min    Activity Tolerance  Patient tolerated treatment well       Past Medical History:  Diagnosis Date  . Chronic low back pain   . Dementia ([place])   . Gait disorder   . Hypertension   . Vitamin B12 deficiency     Past Surgical History:  Procedure Laterality Date  . CATARACT EXTRACTION, BILATERAL    . COLONOSCOPY    . COLONOSCOPY  02/04/2011   Procedure: COLONOSCOPY;  Surgeon: Daneil Dolin, MD;  [facility];  Service: Endoscopy;  Laterality: N/A;    There were no vitals filed for this visit.   ADULT SLP TREATMENT - 11/02/19 1052      General Information   Behavior/Cognition  Alert;Cooperative;Pleasant mood    Patient Positioning  Upright in chair    Oral care provided  N/A    HPI  Luis Freeman is a 86 y.o. male who has a past medical history of Cancer (CMS-HCC), Dental caries, History of cataract surgery, History of ongoing treatment with alendronate (Fosamax), and Hypertension. presenting in consultation for evaluation of abnormal movements. Family began to notice changes in speech in 2018 - not completing sentences, followed by episodes of confusion, losing track of thoughts. Now having significant work finding difficulties, poor attention. Often says "yes" when he means "no", and there is some echolalia. Around the  same time has had abnormal movements described as fidgeting with the hands. Right arm elevates while he walks. Has trouble with fine motor skills and walking and balance, but no falls. He shuffles. His neurologist suspected CBD or other neurodegenerative condition. He was tried on Aricept but this caused nightmares so was stopped. Namenda has no side effects but is not clearly helping. On exam I see fairly symmetrical mild bradykinesia and rigidity, more rigidity in the neck, and ideomotor and visuospatial apraxia bilaterally. There are "fidgety" movements in the hands but no evidence of chorea (movemetns disappear when arms held out in front of him). 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 Dr. Regis Bill referred Pt for SLE and treatment.      Treatment Provided   Treatment provided  Cognitive-Linquistic      Pain Assessment   Pain Assessment  No/denies pain      Cognitive-Linquistic Treatment   Treatment focused on  Cognition;Aphasia;Dysarthria;Patient/family/caregiver education    Skilled Treatment SLP facilitated session by providing personalized communication supports (written salient proper names) during Pt centered communication topics. Pt required mod cues to use his communication template to answer personally relevant information regarding friends, family, and medical professionals.      Assessment / Recommendations / Plan   Plan  Continue with current plan of care      Progression Toward Goals   Progression toward goals  Progressing toward goals         SLP Short Term Goals - 11/02/19 1056      SLP SHORT TERM GOAL #1   Title  Pt will implement speech intelligibility strategies at the sentence level with 90% acc with min cues.    Baseline  cues for increased intensity and respiration; 80%    Time  4    Period  Weeks    Status  On-going    Target Date  11/08/19      SLP SHORT TERM GOAL #2   Title  Pt  will complete moderate level word retrieval activities with 90% acc when provided mi/mod cues.    Baseline  70%    Time  4    Period  Weeks    Status  On-going    Target Date  11/08/19      SLP SHORT TERM GOAL #3   Title  Pt will complete screening for SPEAK OUT! treatment to see if Pt would be a good candidate.    Baseline  will complete next session    Time  2    Period  Weeks    Status  Achieved    Target Date  11/08/19      SLP SHORT TERM GOAL #4   Title  Pt will provide verbal description of pictured activities with 90% acc for content and fluency with use of strategies and min assist.    Baseline  Pt using rote sentences at this time    Time  4    Period  Weeks    Status  On-going    Target Date  11/08/19      SLP SHORT TERM GOAL #5   Title  Pt will complete moderate level planning and organization tasks with 90% acc with use of strategies and mi/mod assist.    Baseline  75%    Time  4    Period  Weeks    Status  On-going    Target Date  11/08/19       SLP Long Term Goals - 11/02/19 1057      SLP LONG TERM GOAL #1   Title  Same as short       Plan - 11/02/19 1055    Clinical Impression Statement  Pt accompanied to therapy by his wife, [name], and his daughter, Curt Bears. Caregiver education was provided throughout the session by demonstrating communication strategies and appropriate cueing. Typed communication template was updated and provided to Pt to place in the notebook. SLP will continue to provide education on best communication strategies to use at home.    Speech Therapy Frequency  2x / week    Duration  4 weeks    Treatment/Interventions  Compensatory strategies;Cueing hierarchy;Functional tasks;Patient/family education;Cognitive reorganization;SLP instruction and feedback;Compensatory techniques    Potential to Achieve Goals  Good    SLP Home Exercise Plan  Pt will complete HEP as assigned to facilitate carryover of treatment strategies and techniques in  home environment with assist from family as needed.    Consulted and Agree with Plan of Care  Patient;Family member/caregiver    Family Member Consulted  Wife, [name] and daughter, Curt Bears       Patient will benefit from skilled therapeutic intervention in order to improve the following deficits and impairments:   Cognitive communication deficit    Problem List Patient  Active Problem List   Diagnosis Date Noted  . Movement disorder 05/18/2019  . Dementia [facility]) 11/10/2018   Thank you,  Genene Churn, [place]  [facility] 11/02/2019, 10:58 AM  [place] [address] [place], [place], [address] Phone: [phone]   Fax:  [phone]   Name: Luis Freeman MRN: [id number] Date of Birth: [birth year]

## 2019-11-02 NOTE — Therapy (Signed)
[place], [place], [address] Phone: [phone]   Fax:  [phone]  Physical Therapy Evaluation  Patient Details  Name: Luis Freeman MRN: [id number] Date of Birth: [birth year] Referring Provider (PT): Dr. Bing Quarry   Encounter Date: 11/02/2019  PT End of Session - 11/02/19 1015    Visit Number  1    Number of Visits  12    Date for PT Re-Evaluation  12/14/19    Authorization Type  [employer] (No visit limit, no auth required)    Authorization Time Period  Check auth dates    Progress Note Due on Visit  10    PT Start Time  0903    PT Stop Time  0947    PT Time Calculation (min)  44 min    Equipment Utilized During Treatment  Gait belt    Activity Tolerance  Patient tolerated treatment well    Behavior During Therapy  [facility] for tasks assessed/performed;Flat affect       Past Medical History:  Diagnosis Date  . Chronic low back pain   . Dementia ([place])   . Gait disorder   . Hypertension   . Vitamin B12 deficiency     Past Surgical History:  Procedure Laterality Date  . CATARACT EXTRACTION, BILATERAL    . COLONOSCOPY    . COLONOSCOPY  02/04/2011   Procedure: COLONOSCOPY;  Surgeon: Daneil Dolin, MD;  [facility];  Service: Endoscopy;  Laterality: N/A;    There were no vitals filed for this visit.   Subjective Assessment - 11/02/19 0917    Subjective  Patient presents with [name] his wife and Belenda Cruise his daughter. Reported that since about April or May of 2020, the patient's gait velocity and balance have been decreasing. Patient reported that his usual routines are a little bit more difficult. 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 She reported that the patient's movements are slowing down. [place] a few weeks ago and had to get stiches in his arm, unsure what caused  this fall if it was related to his BP or a heart issue and patient is planning to get an ECG, but is waiting to schedule it. The patient fell while he was helping move clothes in the laundry. Patient presents ambulating with a quad cane. The patient, nor his family did not report any noted retropulsion. Reported that about 6 months ago the patient was walking up and down the street but that now he is only walking the driveway. Reported that he stopped driving in February [id number]. Reported that he has only been using the quad cane for about 2 weeks.    Patient is accompained by:  Family member   Wife and daughter   Pertinent History  Progressive decrease in balance and ambulation speed    Limitations  Walking;Standing;House hold activities    Patient Stated Goals  Walking better, improving his balance and helping him get into the shower    Currently in Pain?  No/denies         [facility] PT Assessment - 11/02/19 0001      Assessment   Medical Diagnosis  Progressive Supranuclear Palsy    Referring Provider (PT)  Dr. Bing Quarry    Onset Date/Surgical Date  --   About April 2020   Next MD Visit  --  May   Prior Therapy  None      Precautions   Precautions  Fall      Restrictions   Weight Bearing Restrictions  No      Balance Screen   Has the patient fallen in the past 6 months  Yes    How many times?  1    Has the patient had a decrease in activity level because of a fear of falling?   Yes    Is the patient reluctant to leave their home because of a fear of falling?   Yes      [place]  Private residence    Living Arrangements  Spouse/significant other    Type of [place] entrance    [place] to live on main level with bedroom/bathroom    Home Equipment  [place] - quad      Prior Function   Level of Independence  Independent;Independent with basic ADLs      Cognition   Overall Cognitive Status  Impaired/Different from  baseline   Defer to SLP     Observation/Other Assessments   Observations  Bilateral ankle edema   Reported that MD was aware and that this was normal for PT     Posture/Postural Control   Posture/Postural Control  Postural limitations    Postural Limitations  Rounded Shoulders;Forward head      ROM / Strength   AROM / PROM / Strength  Strength      Strength   Strength Assessment Site  Hip;Knee;Ankle    Right/Left Hip  Right;Left    Right Hip Flexion  3+/5    Right Hip Extension  2+/5    Right Hip ABduction  4-/5    Left Hip Flexion  3+/5    Left Hip Extension  2+/5    Left Hip ABduction  4-/5    Right/Left Knee  Right;Left    Right Knee Flexion  4/5    Right Knee Extension  4+/5    Left Knee Flexion  4/5    Left Knee Extension  4+/5    Right/Left Ankle  Right;Left    Right Ankle Dorsiflexion  5/5    Left Ankle Dorsiflexion  5/5      Flexibility   Soft Tissue Assessment /Muscle Length  yes    Hamstrings  50% limited Bil       Bed Mobility   Bed Mobility  Rolling Right;Rolling Left    Rolling Right  Minimal Assistance - Patient > 75%    Rolling Left  Minimal Assistance - Patient > 75%      Ambulation/Gait   Gait Comments  Observed slow gait velocity when ambulating in clinic      Standardized Balance Assessment   Standardized Balance Assessment  Berg Balance Test      Berg Balance Test   Sit to Stand  Able to stand without using hands and stabilize independently    Standing Unsupported  Able to stand safely 2 minutes    Sitting with Back Unsupported but Feet Supported on Floor or Stool  Able to sit safely and securely 2 minutes    Stand to Sit  Controls descent by using hands    Transfers  Able to transfer safely, minor use of hands    Standing Unsupported with Eyes Closed  Able to stand 10 seconds with supervision    Standing Unsupported with Feet  Together  Able to place feet together independently and stand for 1 minute with supervision    From Standing, Reach  Forward with Outstretched Arm  Reaches forward but needs supervision    From Standing Position, Pick up Object from [place] to pick up shoe safely and easily    From Standing Position, Turn to Look Behind Over each Shoulder  Turn sideways only but maintains balance    Turn 360 Degrees  Able to turn 360 degrees safely but slowly    Standing Unsupported, Alternately Place Feet on Step/Stool  Able to complete >2 steps/needs minimal assist    Standing Unsupported, One Foot in Front  Needs help to step but can hold 15 seconds    Standing on One Leg  Unable to try or needs assist to prevent fall    Total Score  36                Objective measurements completed on examination: See above findings.              PT Education - 11/02/19 1014    Education Details  Discussed examination findings, POC, and initial HEP.    Person(s) Educated  Patient;Spouse;Child(ren)    Methods  Explanation    Comprehension  Verbalized understanding       PT Short Term Goals - 11/02/19 1016      PT SHORT TERM GOAL #1   Title  Patient and family will report understanding and regular compliance with HEP to improve balance, gait, strength, and overall functional mobility.    Time  3    Period  Weeks    Status  New    Target Date  11/23/19        PT Long Term Goals - 11/02/19 1017      PT LONG TERM GOAL #1   Title  Patient will demonstrate improvement of at least 5 points on the BBS indicating improved balance and to have less risk of falls.    Time  6    Period  Weeks    Status  New    Target Date  12/14/19      PT LONG TERM GOAL #2   Title  Patient's will demonstrate ability to perform bed mobility with mod I.    Time  6    Period  Weeks    Status  New    Target Date  12/14/19      PT LONG TERM GOAL #3   Title  Patient and family will be educated on options for options of safe showering and will report an improvement in ease of being able to shower by at least 50%.    Time   6    Period  Weeks    Status  New    Target Date  12/14/19      PT LONG TERM GOAL #4   Title  Patient will demonstrate improvement of at least 50 feet while ambulating on the 2MWT indicating improved gait speed and safety to reduce falls.    Time  6    Period  Weeks    Status  New    Target Date  12/14/19             Plan - 11/02/19 1026    Clinical Impression Statement  Patient is an 86 year old male who presents to outpatient physical therapy with his daughter and wife with primary concern that over the last year the  patient's gait velocity and balance have been progressively decreasing. Upon examination, patient demonstrated markedly decreased gait velocity in clinic. He demonstrated decreased lower extremity strength as well as muscle tightness in the lower extremities. Patient also had a decreased score on the BBS particularly with more dynamic activities. Educated patient and family on how to perform seated marching for lower extremity strengthening at home. Patient would benefit from skilled physical therapy in order to address the abovementioned deficits and help patient improve overall functional mobility.    Personal Factors and Comorbidities  Age;Comorbidity 2    Comorbidities  Dementia, HTN    Examination-Activity Limitations  Bathing;Stand;Bed Mobility;Locomotion Level;Toileting;Bend;Transfers;Carry;Squat;Stairs    Examination-Participation Restrictions  Yard Work;Cleaning;Meal Prep;Community Activity;Laundry;Shop    Stability/Clinical Decision Making  Evolving/Moderate complexity    Clinical Decision Making  Moderate    Rehab Potential  Fair    PT Frequency  2x / week    PT Duration  6 weeks    PT Treatment/Interventions  ADLs/Self Care Home Management;Aquatic Therapy;Cryotherapy;Electrical Stimulation;Moist Heat;DME Instruction;Gait training;Stair training;Functional mobility training;Therapeutic activities;Therapeutic exercise;Balance training;Neuromuscular  re-education;Patient/family education;Orthotic Fit/Training;Manual techniques;Passive range of motion;Energy conservation;Taping    PT Next Visit Plan  Review eval/goals, perform 2MWT, consider pull test, review HEP marching & trial options for HS stretch and add to HEP. Discuss difficulty with getting into shower and possibly recommend a shower seat if needed.    PT Home Exercise Plan  11/02/19: Seated marching slow and controlled x20 daily    Consulted and Agree with Plan of Care  Patient;Family member/caregiver    Family Member Consulted  wife and daughter       Patient will benefit from skilled therapeutic intervention in order to improve the following deficits and impairments:  Abnormal gait, Improper body mechanics, Decreased mobility, Postural dysfunction, Decreased activity tolerance, Decreased endurance, Decreased strength, Decreased balance, Difficulty walking, Impaired flexibility  Visit Diagnosis: Other abnormalities of gait and mobility  Unsteadiness on feet  Muscle weakness (generalized)     Problem List Patient Active Problem List   Diagnosis Date Noted  . Movement disorder 05/18/2019  . Dementia ([place]) 11/10/2018   Clarene Critchley PT, DPT 10:31 AM, 11/02/19 [place], [place], [address] Phone: [phone]   Fax:  [phone]  Name: Luis Freeman MRN: [id number] Date of Birth: 04-05-1933

## 2019-11-07 ENCOUNTER — Encounter (HOSPITAL_COMMUNITY): Payer: Self-pay | Admitting: Speech Pathology

## 2019-11-07 ENCOUNTER — Ambulatory Visit (HOSPITAL_COMMUNITY): Payer: Medicare PPO | Admitting: Speech Pathology

## 2019-11-07 ENCOUNTER — Ambulatory Visit (HOSPITAL_COMMUNITY): Payer: Medicare PPO

## 2019-11-07 ENCOUNTER — Encounter (HOSPITAL_COMMUNITY): Payer: Self-pay

## 2019-11-07 ENCOUNTER — Other Ambulatory Visit: Payer: Self-pay

## 2019-11-07 DIAGNOSIS — R2681 Unsteadiness on feet: Secondary | ICD-10-CM | POA: Diagnosis not present

## 2019-11-07 DIAGNOSIS — R41841 Cognitive communication deficit: Secondary | ICD-10-CM | POA: Diagnosis not present

## 2019-11-07 DIAGNOSIS — M6281 Muscle weakness (generalized): Secondary | ICD-10-CM | POA: Diagnosis not present

## 2019-11-07 DIAGNOSIS — R29898 Other symptoms and signs involving the musculoskeletal system: Secondary | ICD-10-CM | POA: Diagnosis not present

## 2019-11-07 DIAGNOSIS — R2689 Other abnormalities of gait and mobility: Secondary | ICD-10-CM | POA: Diagnosis not present

## 2019-11-07 NOTE — Therapy (Signed)
[place], [place], [address] Phone: [phone]   Fax:  [phone]  Speech Language Pathology Treatment  Patient Details  Name: Luis Freeman MRN: [id number] Date of Birth: March 03, 1933 Referring Provider (SLP): Regis Bill, MD   Encounter Date: 11/07/2019  End of Session - 11/07/19 1603    Visit Number  8    Number of Visits  9    Date for SLP Re-Evaluation  11/22/19    Authorization Type  [employer] Choice PPO    Authorization - Visit Number  7    Authorization - Number of Visits  8    SLP Start Time  1522    SLP Stop Time   1603    SLP Time Calculation (min)  41 min    Activity Tolerance  Patient tolerated treatment well       Past Medical History:  Diagnosis Date  . Chronic low back pain   . Dementia ([place])   . Gait disorder   . Hypertension   . Vitamin B12 deficiency     Past Surgical History:  Procedure Laterality Date  . CATARACT EXTRACTION, BILATERAL    . COLONOSCOPY    . COLONOSCOPY  02/04/2011   Procedure: COLONOSCOPY;  Surgeon: Daneil Dolin, MD;  [facility];  Service: Endoscopy;  Laterality: N/A;    There were no vitals filed for this visit.  Subjective Assessment - 11/07/19 1540    Subjective  "I saw my neighbor."    Patient is accompained by:  Family member    Currently in Pain?  No/denies        ADULT SLP TREATMENT - 11/07/19 1540      General Information   Behavior/Cognition  Alert;Cooperative;Pleasant mood    Patient Positioning  Upright in chair    Oral care provided  N/A    HPI  Luis Freeman is a 86 y.o. male who has a past medical history of Cancer (CMS-HCC), Dental caries, History of cataract surgery, History of ongoing treatment with alendronate (Fosamax), and Hypertension. presenting in consultation for evaluation of abnormal movements. Family began to notice changes in speech in 2018 - not completing sentences, followed by episodes of confusion,  losing track of thoughts. Now having significant work finding difficulties, poor attention. Often says "yes" when he means "no", and there is some echolalia. Around the same time has had abnormal movements described as fidgeting with the hands. Right arm elevates while he walks. Has trouble with fine motor skills and walking and balance, but no falls. He shuffles. His neurologist suspected CBD or other neurodegenerative condition. He was tried on Aricept but this caused nightmares so was stopped. Namenda has no side effects but is not clearly helping. On exam I see fairly symmetrical mild bradykinesia and rigidity, more rigidity in the neck, and ideomotor and visuospatial apraxia bilaterally. There are "fidgety" movements in the hands but no evidence of chorea (movemetns disappear when arms held out in front of him). 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 Dr. Regis Bill referred Pt for SLE and treatment.      Treatment Provided   Treatment provided  Cognitive-Linquistic      Pain Assessment   Pain Assessment  No/denies pain      Cognitive-Linquistic Treatment   Treatment focused on  Cognition;Aphasia;Dysarthria;Patient/family/caregiver education    Skilled Treatment  SLP facilitated session by providing personalized communication supports (written salient proper names)  during Pt centered communication topics. Pt required min/mod cues to use his communication template to answer personally relevant information regarding friends, family, and medical professionals.       Assessment / Recommendations / Plan   Plan  Continue with current plan of care         SLP Short Term Goals - 11/07/19 1610      SLP SHORT TERM GOAL #1   Title  Pt will implement speech intelligibility strategies at the sentence level with 90% acc with min cues.    Baseline  cues for increased intensity and respiration; 80%    Time  4    Period  Weeks     Status  On-going    Target Date  11/08/19      SLP SHORT TERM GOAL #2   Title  Pt will complete moderate level word retrieval activities with 90% acc when provided mi/mod cues.    Baseline  70%    Time  4    Period  Weeks    Status  On-going    Target Date  11/08/19      SLP SHORT TERM GOAL #3   Title  Pt will complete screening for SPEAK OUT! treatment to see if Pt would be a good candidate.    Baseline  will complete next session    Time  2    Period  Weeks    Status  Achieved    Target Date  11/08/19      SLP SHORT TERM GOAL #4   Title  Pt will provide verbal description of pictured activities with 90% acc for content and fluency with use of strategies and min assist.    Baseline  Pt using rote sentences at this time    Time  4    Period  Weeks    Status  On-going    Target Date  11/08/19      SLP SHORT TERM GOAL #5   Title  Pt will complete moderate level planning and organization tasks with 90% acc with use of strategies and mi/mod assist.    Baseline  75%    Time  4    Period  Weeks    Status  On-going    Target Date  11/08/19       SLP Long Term Goals - 11/02/19 1057      SLP LONG TERM GOAL #1   Title  Same as short       Plan - 11/07/19 1603    Clinical Impression Statement  Pt accompanied to therapy by his wife, [name], and his daughter, Seth Bake. Caregiver education was provided throughout the session by demonstrating communication strategies and appropriate cueing. Pt benefited from allowance for extra time and offering binary choices. 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 SLP will continue to provide education on best communication strategies to use at home.    Speech Therapy Frequency  2x / week    Duration  4 weeks    Treatment/Interventions  Compensatory strategies;Cueing hierarchy;Functional tasks;Patient/family education;Cognitive  reorganization;SLP instruction and feedback;Compensatory techniques    Potential to Achieve Goals  Good    SLP Home Exercise Plan  Pt will complete HEP as assigned to facilitate carryover of treatment strategies and techniques in home environment with assist from family as needed.    Consulted and Agree with Plan of Care  Patient;Family member/caregiver    Family Member Consulted  Wife, [name] and daughter, Seth Bake       Patient will benefit from skilled therapeutic intervention in order to improve the following deficits and impairments:   Cognitive communication deficit    Problem List Patient Active Problem List   Diagnosis Date Noted  . Movement disorder 05/18/2019  . Dementia [facility]) 11/10/2018   Thank you,  Genene Churn, [place]  Genene Churn 11/07/2019, [place] [address] [place], [place], [address] Phone: [phone]   Fax:  [phone]   Name: Luis Freeman MRN: [id number] Date of Birth: 1932-09-12

## 2019-11-08 ENCOUNTER — Encounter (HOSPITAL_COMMUNITY): Payer: Self-pay | Admitting: Physical Therapy

## 2019-11-08 ENCOUNTER — Ambulatory Visit (HOSPITAL_COMMUNITY): Payer: Medicare PPO | Admitting: Physical Therapy

## 2019-11-08 DIAGNOSIS — R2689 Other abnormalities of gait and mobility: Secondary | ICD-10-CM

## 2019-11-08 DIAGNOSIS — R41841 Cognitive communication deficit: Secondary | ICD-10-CM | POA: Diagnosis not present

## 2019-11-08 DIAGNOSIS — R2681 Unsteadiness on feet: Secondary | ICD-10-CM | POA: Diagnosis not present

## 2019-11-08 DIAGNOSIS — M6281 Muscle weakness (generalized): Secondary | ICD-10-CM | POA: Diagnosis not present

## 2019-11-08 DIAGNOSIS — R29898 Other symptoms and signs involving the musculoskeletal system: Secondary | ICD-10-CM | POA: Diagnosis not present

## 2019-11-08 NOTE — Therapy (Signed)
[place], [place], [address] Phone: [phone]   Fax:  [phone]  Occupational Therapy Evaluation  Patient Details  Name: Luis Freeman MRN: [id number] Date of Birth: [birth year] Referring Provider (OT): Regis Bill, MD   Encounter Date: 11/07/2019  OT End of Session - 11/08/19 1153    Visit Number  1    Number of Visits  3    Date for OT Re-Evaluation  11/23/19    Authorization Type  [employer] Medicare    Authorization Time Period  $20 copay. No visit limit. Filled out [employer] authorization form    Progress Note Due on Visit  10    OT Start Time  1605    OT Stop Time  1638    OT Time Calculation (min)  33 min    Activity Tolerance  Patient tolerated treatment well    Behavior During Therapy  WFL for tasks assessed/performed;Flat affect       Past Medical History:  Diagnosis Date  . Chronic low back pain   . Dementia ([place])   . Gait disorder   . Hypertension   . Vitamin B12 deficiency     Past Surgical History:  Procedure Laterality Date  . CATARACT EXTRACTION, BILATERAL    . COLONOSCOPY    . COLONOSCOPY  02/04/2011   Procedure: COLONOSCOPY;  Surgeon: Daneil Dolin, MD;  [facility];  Service: Endoscopy;  Laterality: N/A;    There were no vitals filed for this visit.  Subjective Assessment - 11/07/19 1649    Subjective   S: I like to watch TV.    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 Symptoms began to develop in 2018. Patient has experienced a decrease in activity tolerance and endurance since the start of COVID. Dr. Mickie Bail has referred patient to occupational therapy for evaluation and treatment.    Patient Stated Goals  None stated.    Currently in Pain?  No/denies        [facility] OT Assessment - 11/08/19 1051      Assessment   Medical  Diagnosis  Progressive Supranuclear Palsy    Referring Provider (OT)  Regis Bill, MD    Onset Date/Surgical Date  --   Initially 2018 symptoms began   Hand Dominance  Right    Prior Therapy  None      Precautions   Precautions  Fall      Restrictions   Weight Bearing Restrictions  No      Balance Screen   Has the patient fallen in the past 6 months  No    Has the patient had a decrease in activity level because of a fear of falling?   No    Is the patient reluctant to leave their home because of a fear of falling?   No      Home  Environment   Family/patient expects to be discharged to:  Private residence    Living Arrangements  Spouse/significant other    Available Help at Discharge  Family      Prior Function   Level of Independence  Requires assistive device for independence    Leisure  Enjoys walking, being active outside, watering the flowers, watching TV      ADL   Equipment Used  Increased time  ADL comments  Patient is able to complete self-feeding, grooming, toileting tasks without assistance. Wife reports that she helps him with donning socks and washing his hair and lower body as he has difficulty with bending down to his feet.       Mobility   Mobility Status  History of falls      Written Expression   Dominant Hand  Right      Vision - History   Baseline Vision  Wears glasses all the time    Additional Comments  Pt reports no abnormalities with his vision. He does agree that his vision isn't as good as it once was although he relates it to normal aging.       Vision Assessment   Vision Assessment  Vision not tested    Comment  Patient did not hold his gaze in any particular direction. He was able to spot a small flying bug on counter and attempted to catch it.       Activity Tolerance   Activity Tolerance Comments  Patient and wife report that he fatigues frequently and activity tolerance has decreased. He has the most energy in the middle of the day.        Cognition   Overall Cognitive Status  --   Not assessed formally. Receiving SLP services.      Observation/Other Assessments   Focus on Therapeutic Outcomes (FOTO)   N/A      Coordination   Gross Motor Movements are Fluid and Coordinated  --   decreased   Fine Motor Movements are Fluid and Coordinated  Yes    Coordination and Movement Description  Patient was able to bend forward while seated in chair and tie his shoelaces.      ROM / Strength   AROM / PROM / Strength  Strength      Strength   Overall Strength Comments  During evaluation, patient was able to demonstrate functional strength in the BUE for all activities.                       OT Education - 11/08/19 1148    Education Details  Verbal education provided regarding occupational therapy and their role with PSP. Discussed home environment safety (remove rugs, proper lighting). Demonstrated use of sock aid if needed. Tub safety: non skid mat or strips on bottom. Elastic shoelaces or Lock laces. Proper fitting shoes for safety and decrease falls.    Person(s) Educated  Patient;Spouse;Child(ren)    Methods  Explanation    Comprehension  Verbalized understanding       OT Short Term Goals - 11/08/19 1207      OT SHORT TERM GOAL #1   Title  Patient and family will be educated and verbalize understanding of HEP for general upper body conditioning in order to maintain current strength and help decrease fatigue level during daily tasks.    Time  2    Period  Weeks    Status  New    Target Date  11/21/19      OT SHORT TERM GOAL #2   Title  Patient and family will demonstrate and verbalize understanding of proper tub transfer technique with use of DME as needed in order to decrease risk of falls during bathing.    Time  2    Period  Weeks    Status  New      OT SHORT TERM GOAL #3   Title  Patient and  family will be educated and verbalize understanding of fatigue management techniques and strategies to  incorporate daily to allow patient to complete desired tasks throughout the day.    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 Due to minimal need for OT services at this stage, a lot of education was provided during evaluation as well as the possibility of needing periodic therapy as condition progresses. Will complete remainder of education at next treatment session and determine if additional visits are needed at that time.    OT Occupational Profile and History  Detailed Assessment- Review of Records and additional review of physical, cognitive, psychosocial history related to current functional performance    Occupational performance deficits (Please refer to evaluation for details):  ADL's;Leisure    Body Structure / Function / Physical Skills  ADL;Endurance;UE functional use;Balance;Decreased knowledge of use of DME    Rehab Potential  Excellent    Clinical Decision Making  Limited treatment options, no task modification necessary    Comorbidities Affecting Occupational Performance:  May have comorbidities impacting occupational performance    Modification or Assistance to Complete Evaluation   No modification of tasks or assist necessary to complete eval    OT Frequency  1x / week    OT Duration  2 weeks    OT Treatment/Interventions  Self-care/ADL training;Energy conservation;Patient/family education;DME and/or AE instruction;Therapeutic activities;Therapeutic exercise    Plan  P: Patient will benefit from skilled OT services to receive further education on DME/AE and strategies to utilize in order to increase his overall safety and functional performance during daily tasks. Treatment  Plan; Will probably require just one treatment. Complete tub transfer with shower chair, complete energy conservation/fatigue management education with handout, provide general UB HEP.    Consulted and Agree with Plan of Care  Patient;Family member/caregiver    Family Member Consulted  wife and daughter       Patient will benefit from skilled therapeutic intervention in order to improve the following deficits and impairments:   Body Structure / Function / Physical Skills: ADL, Endurance, UE functional use, Balance, Decreased knowledge of use of DME       Visit Diagnosis: Other symptoms and signs involving the musculoskeletal system - Plan: Ot plan of care cert/re-cert    Problem List Patient Active Problem List   Diagnosis Date Noted  . Movement disorder 05/18/2019  . Dementia [facility]) 11/10/2018   Ailene Ravel, OTR/L,CBIS  [phone]  11/08/2019, 12:28 PM  [place] [address] [place], [place], [address] Phone: [phone]   Fax:  [phone]  Name: Luis Freeman MRN: [id number] Date of Birth: 07/07/1933

## 2019-11-08 NOTE — Therapy (Signed)
[place], [place], [address] Phone: [phone]   Fax:  [phone]  Physical Therapy Treatment  Patient Details  Name: Luis Freeman MRN: [id number] Date of Birth: [birth year] Referring Provider (PT): Dr. Regis Bill   Encounter Date: 11/08/2019  PT End of Session - 11/08/19 1108    Visit Number  2    Number of Visits  12    Date for PT Re-Evaluation  12/14/19    Authorization Type  [employer] Medicare (No visit limit, no auth required)    Authorization Time Period  Check auth dates    Progress Note Due on Visit  10    PT Start Time  0816    PT Stop Time  0855    PT Time Calculation (min)  39 min    Equipment Utilized During Treatment  Gait belt    Activity Tolerance  Patient tolerated treatment well    Behavior During Therapy  [facility] for tasks assessed/performed;Flat affect       Past Medical History:  Diagnosis Date  . Chronic low back pain   . Dementia ([place])   . Gait disorder   . Hypertension   . Vitamin B12 deficiency     Past Surgical History:  Procedure Laterality Date  . CATARACT EXTRACTION, BILATERAL    . COLONOSCOPY    . COLONOSCOPY  02/04/2011   Procedure: COLONOSCOPY;  Surgeon: Daneil Dolin, MD;  [facility];  Service: Endoscopy;  Laterality: N/A;    There were no vitals filed for this visit.  Subjective Assessment - 11/08/19 0820    Subjective  Patient reported no pain currently stated he hasn't been able to do the HEP yet. Patient reported he is afraid of falling when his wife washes his hair. Therapist explained that she would discuss this with OT and try to come up with alternatives.    Patient is accompained by:  Family member   daughter   Pertinent History  Progressive decrease in balance and ambulation speed    Limitations  Walking;Standing;House hold activities    Patient Stated Goals  Walking better, improving his balance and helping him get into the shower    Currently in Pain?  No/denies         [facility] PT Assessment - 11/08/19 0001      Assessment   Medical Diagnosis  Progressive Supranuclear Palsy    Referring Provider (PT)  Dr. Regis Bill      Ambulation/Gait   Ambulation/Gait  Yes    Ambulation Distance (Feet)  175 Feet   2MWT   Assistive device  Small based quad cane    Gait Pattern  Shuffle;Wide base of support    Ambulation Surface  Level;Indoor      Balance   Balance Assessed  --   Pull Test: 0; patient takes 2 steps to recover balance                  [facility] Adult PT Treatment/Exercise - 11/08/19 0001      Ambulation/Gait   Gait Comments  Reaching for walls intermittently throughout      Exercises   Exercises  Knee/Hip      Knee/Hip Exercises: Stretches   Passive Hamstring Stretch  Right;Left;3 reps;30 seconds    Passive Hamstring Stretch Limitations  Supine with strap; VCs to straighten knee    Other Knee/Hip Stretches  LTR x10 5'' holds  PT Short Term Goals - 11/08/19 [id number]      PT SHORT TERM GOAL #1   Title  Patient and family will report understanding and regular compliance with HEP to improve balance, gait, strength, and overall functional mobility.    Time  3    Period  Weeks    Status  On-going    Target Date  11/23/19        PT Long Term Goals - 11/08/19 [id number]      PT LONG TERM GOAL #1   Title  Patient will demonstrate improvement of at least 5 points on the BBS indicating improved balance and to have less risk of falls.    Time  6    Period  Weeks    Status  On-going      PT LONG TERM GOAL #2   Title  Patient's will demonstrate ability to perform bed mobility with mod I.    Time  6    Period  Weeks    Status  On-going      PT LONG TERM GOAL #3   Title  Patient and family will be educated on options for options of safe showering and will report an improvement in ease of being able to shower by at least 50%.    Time  6    Period  Weeks    Status  On-going       PT LONG TERM GOAL #4   Title  Patient will demonstrate improvement of at least 50 feet while ambulating on the 2MWT indicating improved gait speed and safety to reduce falls.    Time  6    Period  Weeks    Status  On-going            Plan - 11/08/19 1110    Clinical Impression Statement  Began by reviewing the patient's goals and performing additional objective measures not captured at the evaluation. Patient demonstrated slow gait velocity on the 2MWT, but demonstrated good stepping strategy on the Pull test. Educated the family on additional HEP and importance of performing daily. Educated the patient and his daughter on a supine hamstring stretch as well as lower trunk rotation to promote mobility. Patient required increased time and cueing for performance of exercises. Discussed patient's shower situation with the patient and the current difficulty is that the patient is afraid of falling, although he does have grab bars and a seat in the shower.  Patient would benefit from continued skilled physical therapy to continue progressing towards functional goals.    Personal Factors and Comorbidities  Age;Comorbidity 2    Comorbidities  Dementia, HTN    Examination-Activity Limitations  Bathing;Stand;Bed Mobility;Locomotion Level;Toileting;Bend;Transfers;Carry;Squat;Stairs    Examination-Participation Restrictions  Yard Work;Cleaning;Meal Prep;Community Activity;Laundry;Shop    Stability/Clinical Decision Making  Evolving/Moderate complexity    Rehab Potential  Fair    PT Frequency  2x / week    PT Duration  6 weeks    PT Treatment/Interventions  ADLs/Self Care Home Management;Aquatic Therapy;Cryotherapy;Electrical Stimulation;Moist Heat;DME Instruction;Gait training;Stair training;Functional mobility training;Therapeutic activities;Therapeutic exercise;Balance training;Neuromuscular re-education;Patient/family education;Orthotic Fit/Training;Manual techniques;Passive range of  motion;Energy conservation;Taping    PT Next Visit Plan  Progress balance training, functional LE strengthening. F/U on compliance of HEP.    PT Home Exercise Plan  11/02/19: Seated marching slow and controlled x20 daily; 11/08/19: HS stretch, LTR    Consulted and Agree with Plan of Care  Patient;Family member/caregiver    Family Member Consulted  daughter  Patient will benefit from skilled therapeutic intervention in order to improve the following deficits and impairments:  Abnormal gait, Improper body mechanics, Decreased mobility, Postural dysfunction, Decreased activity tolerance, Decreased endurance, Decreased strength, Decreased balance, Difficulty walking, Impaired flexibility  Visit Diagnosis: Other abnormalities of gait and mobility  Unsteadiness on feet  Muscle weakness (generalized)     Problem List Patient Active Problem List   Diagnosis Date Noted  . Movement disorder 05/18/2019  . Dementia ([place]) 11/10/2018   Clarene Critchley PT, DPT 11:13 AM, 11/08/19 [place], [place], [address] Phone: [phone]   Fax:  [phone]  Name: Luis Freeman MRN: [id number] Date of Birth: 1933-02-13

## 2019-11-08 NOTE — Patient Instructions (Addendum)
Hamstring Stretch, Reclined (Strap, Doorframe)    Lengthen bottom leg on floor. Extend top leg  and press foot up into yoga strap. Hold for 20 seconds. Repeat __3__ times each leg.  Copyright  VHI. All rights reserved.  Lumbar Rotation (Non-Weight Bearing)    Feet on floor, slowly rock knees from side to side in small, pain-free range of motion. Allow lower back to rotate slightly. 5 second holds Repeat _10___ times per set. Do __1__ sets per session. Do __1-2__ sessions per day.  http://orth.exer.us/160   Copyright  VHI. All rights reserved.

## 2019-11-09 ENCOUNTER — Other Ambulatory Visit: Payer: Self-pay

## 2019-11-09 ENCOUNTER — Ambulatory Visit (HOSPITAL_COMMUNITY): Payer: Medicare PPO | Admitting: Speech Pathology

## 2019-11-09 ENCOUNTER — Encounter (HOSPITAL_COMMUNITY): Payer: Self-pay | Admitting: Speech Pathology

## 2019-11-09 DIAGNOSIS — E871 Hypo-osmolality and hyponatremia: Secondary | ICD-10-CM | POA: Diagnosis not present

## 2019-11-09 DIAGNOSIS — R29898 Other symptoms and signs involving the musculoskeletal system: Secondary | ICD-10-CM | POA: Diagnosis not present

## 2019-11-09 DIAGNOSIS — R2689 Other abnormalities of gait and mobility: Secondary | ICD-10-CM | POA: Diagnosis not present

## 2019-11-09 DIAGNOSIS — M6281 Muscle weakness (generalized): Secondary | ICD-10-CM | POA: Diagnosis not present

## 2019-11-09 DIAGNOSIS — R41841 Cognitive communication deficit: Secondary | ICD-10-CM

## 2019-11-09 DIAGNOSIS — Z79899 Other long term (current) drug therapy: Secondary | ICD-10-CM | POA: Diagnosis not present

## 2019-11-09 DIAGNOSIS — I10 Essential (primary) hypertension: Secondary | ICD-10-CM | POA: Diagnosis not present

## 2019-11-09 DIAGNOSIS — R2681 Unsteadiness on feet: Secondary | ICD-10-CM | POA: Diagnosis not present

## 2019-11-09 NOTE — Therapy (Signed)
[place], [place], [address] Phone: [phone]   Fax:  [phone]  Speech Language Pathology Treatment  Patient Details  Name: Luis Freeman MRN: [id number] Date of Birth: [birth year] Referring Provider (SLP): Regis Bill, MD   Encounter Date: 11/09/2019  End of Session - 11/09/19 1310    Visit Number  9    Number of Visits  11    Date for SLP Re-Evaluation  11/22/19    Authorization Type  [employer] Medicare Choice PPO    Authorization - Visit Number  8    Authorization - Number of Visits  10    SLP Start Time  [id number]    SLP Stop Time   1225    SLP Time Calculation (min)  46 min    Activity Tolerance  Patient tolerated treatment well       Past Medical History:  Diagnosis Date  . Chronic low back pain   . Dementia ([place])   . Gait disorder   . Hypertension   . Vitamin B12 deficiency     Past Surgical History:  Procedure Laterality Date  . CATARACT EXTRACTION, BILATERAL    . COLONOSCOPY    . COLONOSCOPY  02/04/2011   Procedure: COLONOSCOPY;  Surgeon: Daneil Dolin, MD;  [facility];  Service: Endoscopy;  Laterality: N/A;    There were no vitals filed for this visit.  Subjective Assessment - 11/09/19 1301    Subjective  "[place]"    Patient is accompained by:  Family member    Currently in Pain?  No/denies        ADULT SLP TREATMENT - 11/09/19 1302      General Information   Behavior/Cognition  Alert;Cooperative;Pleasant mood    Patient Positioning  Upright in chair    Oral care provided  N/A    HPI  Luis Freeman is a 86 y.o. male who has a past medical history of Cancer (CMS-HCC), Dental caries, History of cataract surgery, History of ongoing treatment with alendronate (Fosamax), and Hypertension. presenting in consultation for evaluation of abnormal movements. Family began to notice changes in speech in 2018 - not completing sentences, followed by episodes of  confusion, losing track of thoughts. Now having significant work finding difficulties, poor attention. Often says "yes" when he means "no", and there is some echolalia. Around the same time has had abnormal movements described as fidgeting with the hands. Right arm elevates while he walks. Has trouble with fine motor skills and walking and balance, but no falls. He shuffles. His neurologist suspected CBD or other neurodegenerative condition. He was tried on Aricept but this caused nightmares so was stopped. Namenda has no side effects but is not clearly helping. On exam I see fairly symmetrical mild bradykinesia and rigidity, more rigidity in the neck, and ideomotor and visuospatial apraxia bilaterally. There are "fidgety" movements in the hands but no evidence of chorea (movemetns disappear when arms held out in front of him). 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 Dr. Regis Bill referred Pt for SLE and treatment.      Treatment Provided   Treatment provided  Cognitive-Linquistic      Pain Assessment   Pain Assessment  No/denies pain      Cognitive-Linquistic Treatment   Treatment focused on  Cognition;Aphasia;Dysarthria;Patient/family/caregiver education    Skilled Treatment  SLP facilitated session by providing personalized communication supports (written salient proper names) during  Pt centered communication topics with family present for education. Pt orally read list of food items with 100% acc. He required mi/mod cues to use his communication template to answer personally relevant information regarding friends, family, and medical professionals. He answered binary choice questions in regards to personal preferences with 95% acc. He answered responsive naming questions with 75% acc.       Assessment / Recommendations / Plan   Plan  Continue with current plan of care      Progression Toward Goals   Progression toward  goals  Progressing toward goals         SLP Short Term Goals - 11/09/19 1316      SLP SHORT TERM GOAL #1   Title  Pt will implement speech intelligibility strategies at the sentence level with 90% acc with min cues.    Baseline  cues for increased intensity and respiration; 80%    Time  4    Period  Weeks    Status  On-going    Target Date  11/08/19      SLP SHORT TERM GOAL #2   Title  Pt will complete moderate level word retrieval activities with 90% acc when provided mi/mod cues.    Baseline  70%    Time  4    Period  Weeks    Status  Achieved    Target Date  11/08/19      SLP SHORT TERM GOAL #3   Title  Pt will complete screening for SPEAK OUT! treatment to see if Pt would be a good candidate.    Baseline  will complete next session    Time  2    Period  Weeks    Status  Achieved    Target Date  11/08/19      SLP SHORT TERM GOAL #4   Title  Pt will provide verbal description of pictured activities with 90% acc for content and fluency with use of strategies and min assist.    Baseline  Pt using rote sentences at this time    Time  4    Period  Weeks    Status  On-going    Target Date  11/08/19      SLP SHORT TERM GOAL #5   Title  Pt will complete moderate level planning and organization tasks with 90% acc with use of strategies and mi/mod assist.    Baseline  75%    Time  4    Period  Weeks    Status  Deferred    Target Date  11/08/19       SLP Long Term Goals - 11/09/19 1316      SLP LONG TERM GOAL #1   Title  Same as short       Plan - 11/09/19 1311    Clinical Impression Statement  Pt accompanied to therapy by his wife, Luis Freeman, and his daughter, Luis Freeman. Caregiver education was provided throughout the session by demonstrating communication strategies and appropriate cueing. Pt benefited from allowance for extra time, written cues, and offering binary choices. SLP will continue to provide education on best communication strategies to use at home for two  final sessions. His wife was asked about communication breakdown situations at home. She stated that sometimes she will give him a binary choice ([place] or ham sandwich) and he responds with "yes". SLP demonstrated use of visual cues by pairing left and right hand extended to offer a choice in each hand and then  verbally clarify with Pt. This was successfully demonstrated with Pt in session with 90% accuracy. Will request two additional visits to solidify communication supports at home and complete caregiver education.    Speech Therapy Frequency  1x /week    Duration  2 weeks    Treatment/Interventions  Compensatory strategies;Cueing hierarchy;Functional tasks;Patient/family education;Cognitive reorganization;SLP instruction and feedback;Compensatory techniques    Potential to Achieve Goals  Good    SLP Home Exercise Plan  Pt will complete HEP as assigned to facilitate carryover of treatment strategies and techniques in home environment with assist from family as needed.    Consulted and Agree with Plan of Care  Patient;Family member/caregiver    Family Member Consulted  Wife, Luis Freeman and daughter, Seth Bake       Patient will benefit from skilled therapeutic intervention in order to improve the following deficits and impairments:   Cognitive communication deficit    Problem List Patient Active Problem List   Diagnosis Date Noted  . Movement disorder 05/18/2019  . Dementia [facility]) 11/10/2018   Thank you,  Genene Churn, [place]  [facility] 11/09/2019, 1:17 PM  [place] [address] [place], [place], [address] Phone: [phone]   Fax:  [phone]   Name: DALLIN DARRAGH MRN: [id number] Date of Birth: May 21, 1933

## 2019-11-10 ENCOUNTER — Encounter (HOSPITAL_COMMUNITY): Payer: Self-pay | Admitting: Physical Therapy

## 2019-11-10 ENCOUNTER — Ambulatory Visit (HOSPITAL_COMMUNITY): Payer: Medicare PPO | Admitting: Physical Therapy

## 2019-11-10 DIAGNOSIS — R2681 Unsteadiness on feet: Secondary | ICD-10-CM | POA: Diagnosis not present

## 2019-11-10 DIAGNOSIS — M6281 Muscle weakness (generalized): Secondary | ICD-10-CM | POA: Diagnosis not present

## 2019-11-10 DIAGNOSIS — R29898 Other symptoms and signs involving the musculoskeletal system: Secondary | ICD-10-CM | POA: Diagnosis not present

## 2019-11-10 DIAGNOSIS — R2689 Other abnormalities of gait and mobility: Secondary | ICD-10-CM

## 2019-11-10 DIAGNOSIS — R41841 Cognitive communication deficit: Secondary | ICD-10-CM | POA: Diagnosis not present

## 2019-11-10 NOTE — Therapy (Addendum)
[facility] [address] [place], [place], [address] Phone: [phone]   Fax:  [phone]  Physical Therapy Treatment  Patient Details  Name: Luis Freeman MRN: [id number] Date of Birth: April 14, 1933 Referring Provider (PT): Dr. Regis Bill   Encounter Date: 11/10/2019  PT End of Session - 11/10/19 0832    Visit Number  3    Number of Visits  12    Date for PT Re-Evaluation  12/14/19    Authorization Type  [employer] (No visit limit, no auth required)    Authorization Time Period  Check auth dates    Progress Note Due on Visit  10    PT Start Time  0816    PT Stop Time  0854    PT Time Calculation (min)  38 min    Equipment Utilized During Treatment  Gait belt    Activity Tolerance  Patient tolerated treatment well    Behavior During Therapy  [facility] for tasks assessed/performed;Flat affect       Past Medical History:  Diagnosis Date  . Chronic low back pain   . Dementia ([place])   . Gait disorder   . Hypertension   . Vitamin B12 deficiency     Past Surgical History:  Procedure Laterality Date  . CATARACT EXTRACTION, BILATERAL    . COLONOSCOPY    . COLONOSCOPY  02/04/2011   Procedure: COLONOSCOPY;  Surgeon: Daneil Dolin, MD;  [facility];  Service: Endoscopy;  Laterality: N/A;    There were no vitals filed for this visit.  Subjective Assessment - 11/10/19 0825    Subjective  Patient reported falling early this morning backwards while he was reaching for the light in the hallway. He denied any pain or hitting his head.  Reported that he has been trying his exercises at home. Denied any pain currently.    Patient is accompained by:  Family member   Wife and daughter   Pertinent History  Progressive decrease in balance and ambulation speed    Limitations  Walking;Standing;House hold activities    Patient Stated Goals  Walking better, improving his balance and helping him get into the shower    Currently in  Pain?  No/denies                       [facility] Adult PT Treatment/Exercise - 11/10/19 0001      Ambulation/Gait   Ambulation/Gait  Yes    Ambulation Distance (Feet)  226 Feet    Assistive device  Small based quad cane    Gait Pattern  Shuffle;Wide base of support    Ambulation Surface  Level;Indoor    Gait Comments  Gait training using metronome 70 BPM for timing as well as VCs and demonstration for heel to toe gait, patient demonstrated difficulty with this and inconsistency with heel to toe gait, but did show some improvement. Also cued to decrease the sound of the steps.       Exercises   Exercises  Knee/Hip      Knee/Hip Exercises: Stretches   Passive Hamstring Stretch  Right;Left;3 reps;20 seconds    Passive Hamstring Stretch Limitations  Supine with strap; VCs to straighten knee    Other Knee/Hip Stretches  LTR x10 5'' holds      Knee/Hip Exercises: Standing   Heel Raises  Both;5 reps    Heel Raises Limitations  Attempted, but decreased ROM with this  Knee/Hip Exercises: Seated   Other Seated Knee/Hip Exercises  Heel/toe raise x 10 each    Sit to Sand  1 set;10 reps;without UE support   From elevated mat table, patient holding onto a ball            PT Education - 11/10/19 1150    Education Details  Discussed continuing HEP and continued to discuss options to improve patient's ability to get into the shower. Educated patient to follow-up with MD if he started to have pain or any other symptoms due to his fall.    Person(s) Educated  Patient;Spouse;Child(ren)    Methods  Explanation    Comprehension  Verbalized understanding       PT Short Term Goals - 11/08/19 [id number]      PT SHORT TERM GOAL #1   Title  Patient and family will report understanding and regular compliance with HEP to improve balance, gait, strength, and overall functional mobility.    Time  3    Period  Weeks    Status  On-going    Target Date  11/23/19        PT Long Term  Goals - 11/08/19 [id number]      PT LONG TERM GOAL #1   Title  Patient will demonstrate improvement of at least 5 points on the BBS indicating improved balance and to have less risk of falls.    Time  6    Period  Weeks    Status  On-going      PT LONG TERM GOAL #2   Title  Patient's will demonstrate ability to perform bed mobility with mod I.    Time  6    Period  Weeks    Status  On-going      PT LONG TERM GOAL #3   Title  Patient and family will be educated on options for options of safe showering and will report an improvement in ease of being able to shower by at least 50%.    Time  6    Period  Weeks    Status  On-going      PT LONG TERM GOAL #4   Title  Patient will demonstrate improvement of at least 50 feet while ambulating on the 2MWT indicating improved gait speed and safety to reduce falls.    Time  6    Period  Weeks    Status  On-going            Plan - 11/10/19 1151    Clinical Impression Statement  Began with stretches to improve mobility. Performed gait training with patient using SBQC and using a metronome app at 70 BPM and provided cueing to improve heel to toe gait. Some minimal carry-over of heel to toe cueing was noted. Did trial cueing to decrease the noise of ambulation as well to decrease shuffling, but did not note much response to this, but may try this in a quieter environment next session as patient is hard of hearing and may not have been able to hear the sound of his feet in the gym. Also performed sit to stands from an elevated mat table. Patient instinctively would reach for the table when standing or sitting and therefore gave the patient a ball to hold onto to decrease upper extremity use. The patient reported that he could not stand up, but with encouragement and minimal cueing he was able to stand without upper extremities. Some decreased eccentric control while sitting was noted.  Personal Factors and Comorbidities  Age;Comorbidity 2     Comorbidities  Dementia, HTN    Examination-Activity Limitations  Bathing;Stand;Bed Mobility;Locomotion Level;Toileting;Bend;Transfers;Carry;Squat;Stairs    Examination-Participation Restrictions  Yard Work;Cleaning;Meal Prep;Community Activity;Laundry;Shop    Stability/Clinical Decision Making  Evolving/Moderate complexity    Rehab Potential  Fair    PT Frequency  2x / week    PT Duration  6 weeks    PT Treatment/Interventions  ADLs/Self Care Home Management;Aquatic Therapy;Cryotherapy;Electrical Stimulation;Moist Heat;DME Instruction;Gait training;Stair training;Functional mobility training;Therapeutic activities;Therapeutic exercise;Balance training;Neuromuscular re-education;Patient/family education;Orthotic Fit/Training;Manual techniques;Passive range of motion;Energy conservation;Taping    PT Next Visit Plan  Trial step-ups to work on ability to get into the shower. Sit to stands without UE use, eccentric control. Ambulating cues to decrease shuffling (quiet environement if possible)    PT Home Exercise Plan  11/02/19: Seated marching slow and controlled x20 daily; 11/08/19: HS stretch, LTR    Consulted and Agree with Plan of Care  Patient;Family member/caregiver    Family Member Consulted  daughter; wife       Patient will benefit from skilled therapeutic intervention in order to improve the following deficits and impairments:  Abnormal gait, Improper body mechanics, Decreased mobility, Postural dysfunction, Decreased activity tolerance, Decreased endurance, Decreased strength, Decreased balance, Difficulty walking, Impaired flexibility  Visit Diagnosis: Other abnormalities of gait and mobility  Unsteadiness on feet  Muscle weakness (generalized)     Problem List Patient Active Problem List   Diagnosis Date Noted  . Movement disorder 05/18/2019  . Dementia ([place]) 11/10/2018   Clarene Critchley PT, DPT 11:58 AM, 11/10/19 [facility] [address] [place], [place], [address] Phone: [phone]   Fax:  [phone]  Name: Luis Freeman MRN: [id number] Date of Birth: 13-Jun-1933

## 2019-11-13 ENCOUNTER — Ambulatory Visit (INDEPENDENT_AMBULATORY_CARE_PROVIDER_SITE_OTHER): Payer: Medicare PPO

## 2019-11-13 ENCOUNTER — Other Ambulatory Visit: Payer: Self-pay

## 2019-11-13 ENCOUNTER — Ambulatory Visit
Admission: EM | Admit: 2019-11-13 | Discharge: 2019-11-13 | Disposition: A | Discharge status: Home / Self Care | Payer: Medicare PPO | Attending: Emergency Medicine | Admitting: Emergency Medicine

## 2019-11-13 DIAGNOSIS — S50812A Abrasion of left forearm, initial encounter: Secondary | ICD-10-CM | POA: Diagnosis not present

## 2019-11-13 DIAGNOSIS — S51812A Laceration without foreign body of left forearm, initial encounter: Secondary | ICD-10-CM

## 2019-11-13 DIAGNOSIS — S59912A Unspecified injury of left forearm, initial encounter: Secondary | ICD-10-CM

## 2019-11-13 MED ORDER — MUPIROCIN CALCIUM 2 % EX CREA: 1.0000 "application " | TOPICAL_CREAM | Freq: Two times a day (BID) | CUTANEOUS | 0 refills | Status: DC

## 2019-11-13 NOTE — Discharge Instructions (Signed)
Bandage applied Keep covered for next and dry for next 12-24 hours.  After then you may gently clean with warm water and mild soap.  Avoid submerging wound in water. Change dressing daily and apply a thin layer of bactroban or mupirocin.  Take OTC ibuprofen or tylenol as needed for pain relief Return or go to the ED if you have any new or worsening symptoms such as increased pain, redness, swelling, drainage, discharge, decreased range of motion of extremity, etc..

## 2019-11-13 NOTE — ED Provider Notes (Signed)
[facility]   [id number] 11/13/19 Arrival Time: 1738  CC: Abrasion  SUBJECTIVE:  Luis Freeman is a 86 y.o. male who presents with an abrasion to LT forearm that occurred 1 hour ago.  Symptoms began after fall on carpet, unwitnessed.  Wife walked in on patient sitting on bottom by recliner.  Bleeding controlled.   Denies similar symptoms in the past.  Denies fever, chills, nausea, vomiting, purulent drainage, decrease strength or sensation.   ROS: As per HPI.  All other pertinent ROS negative.     Past Medical History:  Diagnosis Date  . Chronic low back pain   . Dementia ([place])   . Gait disorder   . Hypertension   . Vitamin B12 deficiency    Past Surgical History:  Procedure Laterality Date  . CATARACT EXTRACTION, BILATERAL    . COLONOSCOPY    . COLONOSCOPY  02/04/2011   Procedure: COLONOSCOPY;  Surgeon: Daneil Dolin, MD;  [facility];  Service: Endoscopy;  Laterality: N/A;   No Known Allergies No current facility-administered medications on file prior to encounter.   Current Outpatient Medications on File Prior to Encounter  Medication Sig Dispense Refill  . amLODipine (NORVASC) 5 MG tablet Take 5 mg by mouth daily.      Marland Kitchen aspirin 81 MG tablet Take 81 mg by mouth daily.      . benazepril (LOTENSIN) 40 MG tablet Take 40 mg by mouth daily.      . cholecalciferol (VITAMIN D) 1000 units tablet Take 1,000 Units by mouth daily.    . cyanocobalamin (,VITAMIN B-12,) 1000 MCG/ML injection Inject 1,000 mcg into the muscle every 30 (thirty) days.   1  . hydrALAZINE (APRESOLINE) 10 MG tablet Take 10 mg by mouth 2 (two) times daily.    . memantine (NAMENDA) 10 MG tablet TK 1 T PO QD    . Polyvinyl Alcohol-Povidone (REFRESH OP) Apply to eye.    Marland Kitchen UNABLE TO FIND Med Name: Compound cream Diclofenac 3&  Lidocaine 5% Menthol %     Social History   Socioeconomic History  . Marital status: Married    Spouse name: Not on file  . Number of children: Not on file    . Years of education: Not on file  . Highest education level: Not on file  Occupational History  . Not on file  Tobacco Use  . Smoking status: Never Smoker  . Smokeless tobacco: Never Used  Substance and Sexual Activity  . Alcohol use: Not Currently    Alcohol/week: 0.0 standard drinks  . Drug use: No  . Sexual activity: Not on file  Other Topics Concern  . Not on file  Social History Narrative  . Not on file   Social Determinants of Health   Financial Resource Strain:   . Difficulty of Paying Living Expenses:   Food Insecurity:   . Worried About Charity fundraiser in the Last Year:   . Arboriculturist in the Last Year:   Transportation Needs:   . Film/video editor (Medical):   Marland Kitchen Lack of Transportation (Non-Medical):   Physical Activity:   . Days of Exercise per Week:   . Minutes of Exercise per Session:   Stress:   . Feeling of Stress :   Social Connections:   . Frequency of Communication with Friends and Family:   . Frequency of Social Gatherings with Friends and Family:   . Attends Religious Services:   . Active  Member of Clubs or Organizations:   . Attends Archivist Meetings:   Marland Kitchen Marital Status:   Intimate Partner Violence:   . Fear of Current or Ex-Partner:   . Emotionally Abused:   Marland Kitchen Physically Abused:   . Sexually Abused:    Family History  Problem Relation Age of Onset  . Diabetes Mother   . Cancer Father   . Cancer - Lung Brother      OBJECTIVE:  Vitals:   11/13/19 1754  BP: (!) 154/76  Pulse: 81  Resp: 18  Temp: 98.3 F (36.8 C)  SpO2: 95%     General appearance: alert; no distress Lungs: Normal respiratory effort CV: radial pulse 2+ MSK: no bony tenderness over elbow or forearm; FROM about the elbow Skin: 2x2 cm abrasion to LT medial posterior forearm Psychological: alert and cooperative; normal mood and affect   DIAGNOSTIC STUDIES:  DG Forearm Left  Result Date: 11/13/2019 CLINICAL DATA:  Fall today  Laceration  to left forearm EXAM: LEFT FOREARM - 2 VIEW COMPARISON:  None. FINDINGS: Osteopenia. There is no evidence of fracture or other focal bone lesions. Soft tissues are unremarkable. No radiopaque foreign body identified. IMPRESSION: Negative radiographs of the left forearm. Electronically Signed   By: Audie Pinto M.D.   On: 11/13/2019 18:08   X-rays negative for bony abnormalities including fracture, or dislocation.  No soft tissue swelling.    I have reviewed the x-rays myself and the radiologist interpretation. I am in agreement with the radiologist interpretation.     ASSESSMENT & PLAN:  1. Abrasion of left forearm, initial encounter   2. Injury of left forearm, initial encounter     Meds ordered this encounter  Medications  . mupirocin cream (BACTROBAN) 2 %    Sig: Apply 1 application topically 2 (two) times daily.    Dispense:  30 g    Refill:  0    Order Specific Question:   Supervising Provider    Answer:   Raylene Everts [id number]   Bandage applied Keep covered for next and dry for next 12-24 hours.  After then you may gently clean with warm water and mild soap.  Avoid submerging wound in water. Change dressing daily and apply a thin layer of bactroban or mupirocin.  Take OTC ibuprofen or tylenol as needed for pain relief Return or go to the ED if you have any new or worsening symptoms such as increased pain, redness, swelling, drainage, discharge, decreased range of motion of extremity, etc..     Reviewed expectations re: course of current medical issues. Questions answered. Outlined signs and symptoms indicating need for more acute intervention. Patient verbalized understanding. After Visit Summary given.   Lestine Box, PA-C 11/13/19 1906

## 2019-11-13 NOTE — ED Triage Notes (Signed)
Pt had fall on carpeted floor about an hour ago , pt has skin tear on left forearm.

## 2019-11-14 ENCOUNTER — Encounter (HOSPITAL_COMMUNITY): Payer: Self-pay

## 2019-11-14 ENCOUNTER — Encounter (HOSPITAL_COMMUNITY): Payer: Self-pay | Admitting: Physical Therapy

## 2019-11-14 ENCOUNTER — Other Ambulatory Visit: Payer: Self-pay

## 2019-11-14 ENCOUNTER — Ambulatory Visit (HOSPITAL_COMMUNITY): Payer: Medicare PPO | Admitting: Physical Therapy

## 2019-11-14 ENCOUNTER — Ambulatory Visit (HOSPITAL_COMMUNITY): Payer: Medicare PPO

## 2019-11-14 DIAGNOSIS — R29898 Other symptoms and signs involving the musculoskeletal system: Secondary | ICD-10-CM

## 2019-11-14 DIAGNOSIS — R41841 Cognitive communication deficit: Secondary | ICD-10-CM | POA: Diagnosis not present

## 2019-11-14 DIAGNOSIS — M6281 Muscle weakness (generalized): Secondary | ICD-10-CM

## 2019-11-14 DIAGNOSIS — R2689 Other abnormalities of gait and mobility: Secondary | ICD-10-CM

## 2019-11-14 DIAGNOSIS — R2681 Unsteadiness on feet: Secondary | ICD-10-CM | POA: Diagnosis not present

## 2019-11-14 NOTE — Patient Instructions (Signed)
Complete 10-12 repetitions of each shoulder exercises. Complete on days that you are not as active. Example: rainy day perhaps.  Use 1lb weights or hold a water bottle or can of soup.  Overhead Press  Raise your elbows to shoulder height out to your side. Raise your arms over your head. Hold and then gently lower your arms to shoulder height    Chest Press  Sit in a chair with your head up and back straight. Start with your elbows bent holding the weights at your chest. Push the weights straight out in front of you until your arms are straight. Pull the weights back slowly to the starting position.     Lateral Raises  While standing and your arms by your side. Slowly raise your arm up, raise only up to shoulder level.   FREE WEIGHT - ABDUCTION  Start by holding a free weight with your elbow straight and by your side with the palm of your hand pointed forward.   Next, lift your arm up to the side, then lower back down and repeat.    SEATED BICEP CURLS - BILATERAL  While sitting in a chair and holding a free weight / dumbbell on each thigh, lift both sides while bending at the elbows. Lower back down and repeat. Complete double the repetitions for bicep curls. 20 repetitions if you completed 10 repetitions for the shoulder exercises.

## 2019-11-14 NOTE — Therapy (Signed)
[facility] [address] [place], [place], [address] Phone: [phone]   Fax:  [phone]  Physical Therapy Treatment  Patient Details  Name: Luis Freeman MRN: [id number] Date of Birth: Sep 27, 1932 Referring Provider (PT): Dr. Regis Bill   Encounter Date: 11/14/2019  PT End of Session - 11/14/19 1124    Visit Number  4    Number of Visits  12    Date for PT Re-Evaluation  12/14/19    Authorization Type  [employer] (No visit limit, no auth required)    Authorization Time Period  --    Progress Note Due on Visit  10    PT Start Time  1118    PT Stop Time  1156    PT Time Calculation (min)  38 min    Equipment Utilized During Treatment  Gait belt    Activity Tolerance  Patient tolerated treatment well    Behavior During Therapy  WFL for tasks assessed/performed;Flat affect       Past Medical History:  Diagnosis Date  . Chronic low back pain   . Dementia ([place])   . Gait disorder   . Hypertension   . Vitamin B12 deficiency     Past Surgical History:  Procedure Laterality Date  . CATARACT EXTRACTION, BILATERAL    . COLONOSCOPY    . COLONOSCOPY  02/04/2011   Procedure: COLONOSCOPY;  Surgeon: Daneil Dolin, MD;  [facility];  Service: Endoscopy;  Laterality: N/A;    There were no vitals filed for this visit.  Subjective Assessment - 11/14/19 1123    Subjective  Patient reported having had a fall at home, and that he injured his arm, but he isn't having any pain right now.    Patient is accompained by:  Family member   Wife and caregiver   Pertinent History  Progressive decrease in balance and ambulation speed    Limitations  Walking;Standing;House hold activities    Patient Stated Goals  Walking better, improving his balance and helping him get into the shower    Currently in Pain?  No/denies                       [facility] Adult PT Treatment/Exercise - 11/14/19 0001      Knee/Hip  Exercises: Standing   Forward Step Up  Right;Left;1 set;10 reps;Hand Hold: 2;Step Height: 4"    Forward Step Up Limitations  Frequent cues for sequencing and remembering which LE to step    Other Standing Knee Exercises  (3) 6-inch hurdles in // bars 2RT forward and 2RT sideways with UE assist. Frequent cues to maintain body facing sideways.    Other Standing Knee Exercises  Retro walking 15' x 4 Min guard. Verbal cues to increase step length             PT Education - 11/14/19 1252    Education Details  Ambulating at home without hearing steps, obstacle practice with caregiver in a safe manner.    Person(s) Educated  Patient;Spouse;Caregiver(s)    Methods  Explanation    Comprehension  Verbalized understanding       PT Short Term Goals - 11/08/19 0821      PT SHORT TERM GOAL #1   Title  Patient and family will report understanding and regular compliance with HEP to improve balance, gait, strength, and overall functional mobility.    Time  3    Period  Weeks  Status  On-going    Target Date  11/23/19        PT Long Term Goals - 11/08/19 [id number]      PT LONG TERM GOAL #1   Title  Patient will demonstrate improvement of at least 5 points on the BBS indicating improved balance and to have less risk of falls.    Time  6    Period  Weeks    Status  On-going      PT LONG TERM GOAL #2   Title  Patient's will demonstrate ability to perform bed mobility with mod I.    Time  6    Period  Weeks    Status  On-going      PT LONG TERM GOAL #3   Title  Patient and family will be educated on options for options of safe showering and will report an improvement in ease of being able to shower by at least 50%.    Time  6    Period  Weeks    Status  On-going      PT LONG TERM GOAL #4   Title  Patient will demonstrate improvement of at least 50 feet while ambulating on the 2MWT indicating improved gait speed and safety to reduce falls.    Time  6    Period  Weeks    Status   On-going            Plan - 11/14/19 1250    Clinical Impression Statement  Began by discussing options to get into and out of the shower at home. Patient performed forward and lateral stepping over 6'' obstacles in the parallel bars to mimic stepping into or out of the shower. Discussed with the patient's wife and caregiver that they should attempt this activity and practice stepping into and out of the shower repeatedly to improve neuroplasticity to the task. With forward step-ups the patient required frequent cueing to properly perform step-ups. With retro walking the patient required cueing to ambulate with larger steps. Discussed with patient and family safe ways to practice stepping over obstacles at home as well as cueing the patient to walk so he cannot hear his feet shuffling.    Personal Factors and Comorbidities  Age;Comorbidity 2    Comorbidities  Dementia, HTN    Examination-Activity Limitations  Bathing;Stand;Bed Mobility;Locomotion Level;Toileting;Bend;Transfers;Carry;Squat;Stairs    Examination-Participation Restrictions  Yard Work;Cleaning;Meal Prep;Community Activity;Laundry;Shop    Stability/Clinical Decision Making  Evolving/Moderate complexity    Rehab Potential  Fair    PT Frequency  2x / week    PT Duration  6 weeks    PT Treatment/Interventions  ADLs/Self Care Home Management;Aquatic Therapy;Cryotherapy;Electrical Stimulation;Moist Heat;DME Instruction;Gait training;Stair training;Functional mobility training;Therapeutic activities;Therapeutic exercise;Balance training;Neuromuscular re-education;Patient/family education;Orthotic Fit/Training;Manual techniques;Passive range of motion;Energy conservation;Taping    PT Next Visit Plan  Sit to stands without UE use, eccentric control. F/U if cueing to decrease sound of steps improved walking    PT Home Exercise Plan  11/02/19: Seated marching slow and controlled x20 daily; 11/08/19: HS stretch, LTR; 11/14/19: Stepping over  obstacles, walking to not hear steps.    Consulted and Agree with Plan of Care  --    Family Member Consulted  --       Patient will benefit from skilled therapeutic intervention in order to improve the following deficits and impairments:  Abnormal gait, Improper body mechanics, Decreased mobility, Postural dysfunction, Decreased activity tolerance, Decreased endurance, Decreased strength, Decreased balance, Difficulty walking, Impaired flexibility  Visit Diagnosis:  Other abnormalities of gait and mobility  Unsteadiness on feet  Muscle weakness (generalized)     Problem List Patient Active Problem List   Diagnosis Date Noted  . Movement disorder 05/18/2019  . Dementia ([place]) 11/10/2018   Clarene Critchley PT, DPT 12:55 PM, 11/14/19 [facility] [address] [place], [place], [address] Phone: [phone]   Fax:  [phone]  Name: Luis Freeman MRN: [id number] Date of Birth: Mar 27, 1933

## 2019-11-14 NOTE — Therapy (Signed)
[facility] [address] [place], [place], [address] Phone: [phone]   Fax:  [phone]  Occupational Therapy Treatment  Patient Details  Name: Luis Freeman MRN: [id number] Date of Birth: 07/20/1933 Referring Provider (OT): Regis Bill, MD   Encounter Date: 11/14/2019  OT End of Session - 11/14/19 1351    Visit Number  2    Number of Visits  3    Date for OT Re-Evaluation  11/23/19    Authorization Type  [employer]    Authorization Time Period  $20 copay. No visit limit. Filled out [facility] authorization form    Progress Note Due on Visit  10    OT Start Time  [phone]    OT Stop Time  1025    OT Time Calculation (min)  35 min    Activity Tolerance  Patient tolerated treatment well    Behavior During Therapy  WFL for tasks assessed/performed   tired      Past Medical History:  Diagnosis Date  . Chronic low back pain   . Dementia ([place])   . Gait disorder   . Hypertension   . Vitamin B12 deficiency     Past Surgical History:  Procedure Laterality Date  . CATARACT EXTRACTION, BILATERAL    . COLONOSCOPY    . COLONOSCOPY  02/04/2011   Procedure: COLONOSCOPY;  Surgeon: Daneil Dolin, MD;  [facility];  Service: Endoscopy;  Laterality: N/A;    There were no vitals filed for this visit.  Subjective Assessment - 11/14/19 1346    Subjective   S: I nervous that I will fall (when discussing taking a shower).    Patient is accompanied by:  Family member   daughter and aide   Currently in Pain?  No/denies                   OT Treatments/Exercises (OP) - 11/14/19 1347      Transfers   Comments  Simulated transfer into walk-in shower with built in shower seat with one grap bar on the far end. 6in step used as reference while patient stepped over 6 inch hurdle. Patient and daughter provided with suggestions, techniques, safety awareness, and problem solving ideas during task.       Exercises   Exercises  Shoulder;Elbow      Shoulder Exercises: Seated   Protraction  Strengthening;Both;10 reps    Protraction Weight (lbs)  1    Flexion  Strengthening;Both;10 reps    Flexion Weight (lbs)  1    Abduction  Strengthening;Both;10 reps    ABduction Weight (lbs)  1    Other Seated Exercises  Lateral raise and overhead press; 10X each; 1#      Elbow Exercises   Elbow Extension  Strengthening;20 reps;Both    Bar Weights/Barbell (Elbow Extension)  1 lb    Other elbow exercises  elbow flexion; both; 20X; 1#             OT Education - 11/14/19 1350    Education Details  General UB strengthening HEP with 1# provided. Energy conservation techniques and strategies. Education provided during shower transfer simulation.    Person(s) Educated  Patient;Child(ren);Caregiver(s)    Methods  Explanation;Demonstration;Verbal cues;Handout    Comprehension  Returned demonstration;Verbalized understanding       OT Short Term Goals - 11/14/19 1432      OT SHORT TERM GOAL #1   Title  Patient and family will be educated  and verbalize understanding of HEP for general upper body conditioning in order to maintain current strength and help decrease fatigue level during daily tasks.    Time  2    Period  Weeks    Status  Achieved    Target Date  11/21/19      OT SHORT TERM GOAL #2   Title  Patient and family will demonstrate and verbalize understanding of proper tub transfer technique with use of DME as needed in order to decrease risk of falls during bathing.    Time  2    Period  Weeks    Status  Achieved      OT SHORT TERM GOAL #3   Title  Patient and family will be educated and verbalize understanding of fatigue management techniques and strategies to incorporate daily to allow patient to complete desired tasks throughout the day.    Time  2    Period  Weeks    Status  Achieved               Plan - 11/14/19 1431    Clinical Impression Statement  A: completed shower transfer  (walk-in versus tub shower) with daughter providing photos for reference. Simulated transfer while using grab bar and built in seat. Recommendation provided to practice this transfer several times before attempting a shower to ensure confidence level is high. Recommended added some non slip strips  on the floor in addition to suction mat for added fall prevention. daughter reports that patient has had two falls recently at home; not in the shower. Provided education for general UB strengthening program to complete as needed. Energy conservation techniques reviewed briefly while providing a handout. All education and questions have been completed at this time. Discharge is recommended.    Body Structure / Function / Physical Skills  ADL;Endurance;UE functional use;Balance;Decreased knowledge of use of DME    Plan  P: D/C from OT services at this time. Patient/family will follow up in the future as needed.    OT Home Exercise Plan  4/27: energy conservation techniques. General UB strengthening    Consulted and Agree with Plan of Care  Patient;Family member/caregiver    Family Member Consulted  daughter       Patient will benefit from skilled therapeutic intervention in order to improve the following deficits and impairments:   Body Structure / Function / Physical Skills: ADL, Endurance, UE functional use, Balance, Decreased knowledge of use of DME       Visit Diagnosis: Other symptoms and signs involving the musculoskeletal system    Problem List Patient Active Problem List   Diagnosis Date Noted  . Movement disorder 05/18/2019  . Dementia ([place]) 11/10/2018    OCCUPATIONAL THERAPY DISCHARGE SUMMARY  Visits from Start of Care: 2    Current functional level related to goals / functional outcomes: See above   Remaining deficits: See above   Education / Equipment: See above Plan: Patient agrees to discharge.  Patient goals were met. Patient is being discharged due to meeting the  stated rehab goals.  ?????        Ailene Ravel, OTR/L,CBIS  [phone]  11/14/2019, 2:36 PM  [facility] [address] [place], [place], [address] Phone: [phone]   Fax:  [phone]  Name: Luis Freeman MRN: [id number] Date of Birth: 12-21-1932

## 2019-11-15 ENCOUNTER — Ambulatory Visit (HOSPITAL_COMMUNITY): Payer: Medicare PPO | Admitting: Speech Pathology

## 2019-11-15 ENCOUNTER — Ambulatory Visit: Payer: Medicare PPO | Admitting: Neurology

## 2019-11-15 ENCOUNTER — Encounter: Payer: Self-pay | Admitting: Neurology

## 2019-11-15 DIAGNOSIS — G231 Progressive supranuclear ophthalmoplegia [Steele-Richardson-Olszewski]: Secondary | ICD-10-CM | POA: Insufficient documentation

## 2019-11-15 DIAGNOSIS — R269 Unspecified abnormalities of gait and mobility: Secondary | ICD-10-CM | POA: Diagnosis not present

## 2019-11-15 HISTORY — DX: Unspecified abnormalities of gait and mobility: R26.9

## 2019-11-15 HISTORY — DX: Progressive supranuclear ophthalmoplegia (steele-Richardson-olszewski): G23.1

## 2019-11-15 MED ORDER — CARBIDOPA-LEVODOPA 25-100 MG PO TABS: ORAL_TABLET | ORAL | 2 refills | Status: DC

## 2019-11-15 NOTE — Patient Instructions (Signed)
We will start sinemet for the walking.  Sinemet (carbidopa) may result in confusion or hallucinations, drowsiness, nausea, or dizziness. If any significant side effects are noted, please contact our office. Sinemet may not be well absorbed when taken with high protein meals, if tolerated it is best to take 30-45 minutes before you eat.

## 2019-11-15 NOTE — Progress Notes (Signed)
Reason for visit: Movement disorder, PSP  Luis Freeman is an 86 y.o. male  History of present illness:  Luis Freeman is an 86 year old right-handed white male with a history of a progressive movement disorder.  The patient has just recently started to fall, he has fallen 3 times in the last several weeks, the last fall was 2 days ago.  He has been seen at [facility] with a movement disorder specialist who felt that he had PSP.  The patient is now in physical, occupational, and speech therapy.  The patient has had some other medical issues.  He has had some swelling in the ankles, he has been on amlodipine.  The patient was placed on chlorthalidone for the swelling, but has developed low sodium levels.  His last blood check showed a level of 128.  The patient has also been noted to have an irregular heartbeat, he is getting a 2D echocardiogram.  The patient is sleeping well, he claims that he is swallowing fairly well.  He is having trouble generating speech.  He does have significant difficulty tracking objects.  Past Medical History:  Diagnosis Date  . Chronic low back pain   . Dementia ([place])   . Gait disorder   . Hypertension   . Vitamin B12 deficiency     Past Surgical History:  Procedure Laterality Date  . CATARACT EXTRACTION, BILATERAL    . COLONOSCOPY    . COLONOSCOPY  02/04/2011   Procedure: COLONOSCOPY;  Surgeon: Daneil Dolin, MD;  [facility];  Service: Endoscopy;  Laterality: N/A;    Family History  Problem Relation Age of Onset  . Diabetes Mother   . Cancer Father   . Cancer - Lung Brother     Social history:  reports that he has never smoked. He has never used smokeless tobacco. He reports previous alcohol use. He reports that he does not use drugs.   No Known Allergies  Medications:  Prior to Admission medications   Medication Sig Start Date End Date Taking? Authorizing Provider  amLODipine (NORVASC) 5 MG tablet Take 5 mg by mouth daily.     Yes  [provider]  aspirin 81 MG tablet Take 81 mg by mouth daily.     Yes [provider]  benazepril (LOTENSIN) 40 MG tablet Take 40 mg by mouth daily.     Yes [provider]  chlorthalidone (HYGROTON) 25 MG tablet Take 25 mg by mouth every other day.   Yes [provider]  cholecalciferol (VITAMIN D) 1000 units tablet Take 1,000 Units by mouth daily.   Yes [provider]  cyanocobalamin (,VITAMIN B-12,) 1000 MCG/ML injection Inject 1,000 mcg into the muscle every 30 (thirty) days.  08/02/15  Yes [provider]  cyanocobalamin (,VITAMIN B-12,) 1000 MCG/ML injection Inject into the muscle. 08/02/15  Yes [provider]  hydrALAZINE (APRESOLINE) 25 MG tablet Take by mouth 3 (three) times daily.  07/03/19  Yes [provider]  memantine (NAMENDA) 10 MG tablet Take by mouth. 08/08/19  Yes [provider]  mupirocin cream (BACTROBAN) 2 % Apply 1 application topically 2 (two) times daily. 11/13/19  Yes W[place]st, [place], PA-C  Polyvinyl Alcohol-Povidone (REFRESH OP) Apply to eye.   Yes [provider]  UNABLE TO FIND Med Name: Compound cream Diclofenac 3&  Lidocaine 5% Menthol %   Yes [provider]    ROS:  Out of a complete 14 system review of symptoms, the  patient complains only of the following symptoms, and all other reviewed systems are negative.  Walking difficulty Memory problems  Blood pressure (!) 147/67, pulse 76, temperature 98.2 F (36.8 C), weight 158 lb (71.7 kg).   Blood pressure, right arm, sitting is 118/50.  Heart rate is irregular.  Blood pressure, right arm, standing is 152/60.  Physical Exam  General: The patient is alert and cooperative at the time of the examination.  Skin: 1+ edema of the ankles is noted.   Neurologic Exam  Mental status: The patient is alert and cooperative at the time examination.  The patient has difficulty with verbal output but is able to  follow verbal commands well.   Cranial nerves: Facial symmetry is present. Speech is slightly dysarthric, fragmented, aphasic. Extraocular movement evaluation reveals that the patient has significant difficulty tracking objects in all fields.  When focusing on an object and turning the head, he has full horizontal eye movements. Visual fields are full.  Motor: The patient has good strength in all 4 extremities.  Sensory examination: Soft touch sensation is symmetric on the face, arms, and legs.  Coordination: The patient has good finger-nose-finger and heel-to-shin bilaterally.  Gait and station: The patient has a stooped posture, he is able to walk with a cane, he has some shuffling of his feet.  Tandem gait was not attempted.  Romberg is negative.  Reflexes: Deep tendon reflexes are symmetric.   Assessment/Plan:  1.  PSP  2.  Gait disorder  The patient has been falling frequently recently.  We will go ahead and start low-dose Sinemet in hopes of getting a modest benefit from this.  He will work up to 25/100 mg tablets 3 times daily.  The patient is on amlodipine which may be the source of the ankle swelling, switching to another medication may allow him to come off of the chlorthalidone and therefore improve the sodium and chloride levels.  This issue could have some impact on his general functioning level and his multiple recent falls.  The patient is in physical, occupational, and speech therapy.  He will follow-up here in 4 months.  The family will call if he has side effects on the Sinemet.  Jill Alexanders MD 11/15/2019 9:09 AM  [facility] [address] [place], [place] [address]  Phone [phone] Fax [phone]

## 2019-11-16 DIAGNOSIS — I4891 Unspecified atrial fibrillation: Secondary | ICD-10-CM | POA: Diagnosis not present

## 2019-11-16 DIAGNOSIS — I10 Essential (primary) hypertension: Secondary | ICD-10-CM | POA: Diagnosis not present

## 2019-11-16 DIAGNOSIS — E871 Hypo-osmolality and hyponatremia: Secondary | ICD-10-CM | POA: Diagnosis not present

## 2019-11-16 DIAGNOSIS — G231 Progressive supranuclear ophthalmoplegia [Steele-Richardson-Olszewski]: Secondary | ICD-10-CM | POA: Diagnosis not present

## 2019-11-16 DIAGNOSIS — R600 Localized edema: Secondary | ICD-10-CM | POA: Diagnosis not present

## 2019-11-17 ENCOUNTER — Ambulatory Visit (HOSPITAL_COMMUNITY): Payer: Medicare PPO | Admitting: Physical Therapy

## 2019-11-17 ENCOUNTER — Encounter (HOSPITAL_COMMUNITY): Payer: Self-pay | Admitting: Physical Therapy

## 2019-11-17 ENCOUNTER — Other Ambulatory Visit: Payer: Self-pay

## 2019-11-17 DIAGNOSIS — R2681 Unsteadiness on feet: Secondary | ICD-10-CM | POA: Diagnosis not present

## 2019-11-17 DIAGNOSIS — M6281 Muscle weakness (generalized): Secondary | ICD-10-CM

## 2019-11-17 DIAGNOSIS — R2689 Other abnormalities of gait and mobility: Secondary | ICD-10-CM

## 2019-11-17 DIAGNOSIS — R29898 Other symptoms and signs involving the musculoskeletal system: Secondary | ICD-10-CM | POA: Diagnosis not present

## 2019-11-17 DIAGNOSIS — R41841 Cognitive communication deficit: Secondary | ICD-10-CM | POA: Diagnosis not present

## 2019-11-17 NOTE — Therapy (Signed)
[facility] [address] [place], [place], [address] Phone: [phone]   Fax:  [phone]  Physical Therapy Treatment  Patient Details  Name: Luis Freeman MRN: [id number] Date of Birth: Jul 29, 1932 Referring Provider (PT): Dr. Regis Bill   Encounter Date: 11/17/2019  PT End of Session - 11/17/19 0857    Visit Number  5    Number of Visits  12    Date for PT Re-Evaluation  12/14/19    Authorization Type  [employer] (No visit limit, no auth required)    Progress Note Due on Visit  10    PT Start Time  0818    PT Stop Time  0857    PT Time Calculation (min)  39 min    Equipment Utilized During Treatment  Gait belt    Activity Tolerance  Patient tolerated treatment well    Behavior During Therapy  [facility] for tasks assessed/performed;Flat affect       Past Medical History:  Diagnosis Date  . Chronic low back pain   . Dementia ([place])   . Gait abnormality 11/15/2019  . Gait disorder   . Hypertension   . 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 Vitamin B12 deficiency     Past Surgical History:  Procedure Laterality Date  . CATARACT EXTRACTION, BILATERAL    . COLONOSCOPY    . COLONOSCOPY  02/04/2011   Procedure: COLONOSCOPY;  Surgeon: Daneil Dolin, MD;  [facility];  Service: Endoscopy;  Laterality: N/A;    There were no vitals filed for this visit.  Subjective Assessment - 11/17/19 0823    Subjective  Patient reported some pain in his arm, but did not quantify. Started Sinemat yesterday.    Patient is accompained by:  Family member   Wife and caregiver   Pertinent History  Progressive decrease in balance and ambulation speed    Limitations  Walking;Standing;House hold activities    Patient Stated Goals  Walking better, improving his balance and helping him get into the shower    Currently in Pain?  Other (Comment)   See above                      [facility] Adult PT  Treatment/Exercise - 11/17/19 0001      Knee/Hip Exercises: Standing   Forward Step Up  Right;Left;1 set;10 reps;Hand Hold: 2;Step Height: 4"    Forward Step Up Limitations  Frequent cues for sequencing and remembering which LE to step    Other Standing Knee Exercises  (3) 6-inch hurdles in // bars 2RT forward and 2RT sideways with UE assist. Frequent cues to maintain body facing sideways.    Other Standing Knee Exercises  NBOS bil UE flexion x10 with 1# weight. Frequent cues to maintain NBOS. Retro walking 15' x 4 Min guard. Verbal cues to increase step length      Knee/Hip Exercises: Seated   Other Seated Knee/Hip Exercises  Heel/toe raise x 10 each    Sit to Sand  10 reps;without UE support;2 sets   Elevated mat table; holding a basketball              PT Short Term Goals - 11/08/19 [id number]      PT SHORT TERM GOAL #1   Title  Patient and family will report understanding and regular compliance with HEP to improve balance, gait, strength, and overall functional mobility.    Time  3  Period  Weeks    Status  On-going    Target Date  11/23/19        PT Long Term Goals - 11/08/19 [id number]      PT LONG TERM GOAL #1   Title  Patient will demonstrate improvement of at least 5 points on the BBS indicating improved balance and to have less risk of falls.    Time  6    Period  Weeks    Status  On-going      PT LONG TERM GOAL #2   Title  Patient's will demonstrate ability to perform bed mobility with mod I.    Time  6    Period  Weeks    Status  On-going      PT LONG TERM GOAL #3   Title  Patient and family will be educated on options for options of safe showering and will report an improvement in ease of being able to shower by at least 50%.    Time  6    Period  Weeks    Status  On-going      PT LONG TERM GOAL #4   Title  Patient will demonstrate improvement of at least 50 feet while ambulating on the 2MWT indicating improved gait speed and safety to reduce falls.    Time   6    Period  Weeks    Status  On-going            Plan - 11/17/19 0857    Clinical Impression Statement  Focused on balance and functional strengthening this session. Added NBOS with overhead lifting of the 1# bar this session. Patient required frequent cues to continue maintaining a NBOS with this. Patient continued to require cueing to take larger steps with retro walking this session as well. Holding the ball with sit to stands seemed to improve patient's ability to perform STS without UE use, but patient still attempted to try and use UEs at times.    Personal Factors and Comorbidities  Age;Comorbidity 2    Comorbidities  Dementia, HTN    Examination-Activity Limitations  Bathing;Stand;Bed Mobility;Locomotion Level;Toileting;Bend;Transfers;Carry;Squat;Stairs    Examination-Participation Restrictions  Yard Work;Cleaning;Meal Prep;Community Activity;Laundry;Shop    Stability/Clinical Decision Making  Evolving/Moderate complexity    Rehab Potential  Fair    PT Frequency  2x / week    PT Duration  6 weeks    PT Treatment/Interventions  ADLs/Self Care Home Management;Aquatic Therapy;Cryotherapy;Electrical Stimulation;Moist Heat;DME Instruction;Gait training;Stair training;Functional mobility training;Therapeutic activities;Therapeutic exercise;Balance training;Neuromuscular re-education;Patient/family education;Orthotic Fit/Training;Manual techniques;Passive range of motion;Energy conservation;Taping    PT Next Visit Plan  Sit to stands without UE use, eccentric control. F/U if cueing to decrease sound of steps improved walking. Boom chakers    PT Home Exercise Plan  11/02/19: Seated marching slow and controlled x20 daily; 11/08/19: HS stretch, LTR; 11/14/19: Stepping over obstacles, walking to not hear steps.       Patient will benefit from skilled therapeutic intervention in order to improve the following deficits and impairments:  Abnormal gait, Improper body mechanics, Decreased mobility,  Postural dysfunction, Decreased activity tolerance, Decreased endurance, Decreased strength, Decreased balance, Difficulty walking, Impaired flexibility  Visit Diagnosis: Other abnormalities of gait and mobility  Unsteadiness on feet  Muscle weakness (generalized)     Problem List Patient Active Problem List   Diagnosis Date Noted  . 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 Gait abnormality 11/15/2019  . Movement disorder 05/18/2019  . Dementia ([place]) 11/10/2018   Otho Ket  Michelle Piper, DPT 8:59 AM, 11/17/19 [place], [place], [address] Phone: [phone]   Fax:  [phone]  Name: Luis Freeman MRN: [id number] Date of Birth: 09-08-1932

## 2019-11-21 ENCOUNTER — Encounter (HOSPITAL_COMMUNITY): Payer: Self-pay | Admitting: Speech Pathology

## 2019-11-21 ENCOUNTER — Ambulatory Visit (HOSPITAL_COMMUNITY): Payer: Medicare PPO | Attending: Neurology | Admitting: Speech Pathology

## 2019-11-21 ENCOUNTER — Other Ambulatory Visit: Payer: Self-pay

## 2019-11-21 ENCOUNTER — Ambulatory Visit (HOSPITAL_COMMUNITY): Payer: Medicare PPO | Admitting: Physical Therapy

## 2019-11-21 ENCOUNTER — Encounter (HOSPITAL_COMMUNITY): Payer: Self-pay | Admitting: Physical Therapy

## 2019-11-21 DIAGNOSIS — M6281 Muscle weakness (generalized): Secondary | ICD-10-CM

## 2019-11-21 DIAGNOSIS — R41841 Cognitive communication deficit: Secondary | ICD-10-CM | POA: Insufficient documentation

## 2019-11-21 DIAGNOSIS — R2689 Other abnormalities of gait and mobility: Secondary | ICD-10-CM | POA: Diagnosis not present

## 2019-11-21 DIAGNOSIS — R2681 Unsteadiness on feet: Secondary | ICD-10-CM | POA: Diagnosis not present

## 2019-11-21 NOTE — Therapy (Signed)
[place], [place], [address] Phone: [phone]   Fax:  [phone]  Physical Therapy Treatment  Patient Details  Name: Luis Freeman MRN: [id number] Date of Birth: 03/29/1933 Referring Provider (PT): Dr. Regis Bill   Encounter Date: 11/21/2019  PT End of Session - 11/21/19 1203    Visit Number  6    Number of Visits  12    Date for PT Re-Evaluation  12/14/19    Authorization Type  [employer] (No visit limit, no auth required)    Progress Note Due on Visit  10    PT Start Time  1117    PT Stop Time  1200    PT Time Calculation (min)  43 min    Equipment Utilized During Treatment  Gait belt    Activity Tolerance  Patient tolerated treatment well    Behavior During Therapy  [facility] for tasks assessed/performed;Flat affect       Past Medical History:  Diagnosis Date  . Chronic low back pain   . Dementia ([place])   . Gait abnormality 11/15/2019  . Gait disorder   . Hypertension   . PSP (progressive supranuclear palsy) ([place]) 11/15/2019  . Vitamin B12 deficiency     Past Surgical History:  Procedure Laterality Date  . CATARACT EXTRACTION, BILATERAL    . COLONOSCOPY    . COLONOSCOPY  02/04/2011   Procedure: COLONOSCOPY;  Surgeon: Daneil Dolin, MD;  [facility];  Service: Endoscopy;  Laterality: N/A;    There were no vitals filed for this visit.  Subjective Assessment - 11/21/19 1126    Subjective  Patient denied any new falls and reported feeling good today. Some arm soreness from the fall several weeks ago.    Patient is accompained by:  Family member   Wife and caregiver   Pertinent History  Progressive decrease in balance and ambulation speed    Limitations  Walking;Standing;House hold activities    Patient Stated Goals  Walking better, improving his balance and helping him get into the shower    Currently in Pain?  No/denies                       [facility] Adult PT  Treatment/Exercise - 11/21/19 0001      Knee/Hip Exercises: Aerobic   Nustep  4 minutes: Level 1 with LE and UE movement for improved priming to motor learning and neuroplasticity      Knee/Hip Exercises: Standing   Other Standing Knee Exercises  Boom Whackers x5 min. (3) 6-inch hurdles in // bars 2RT forward and 2RT sideways with UE assist. Frequent cues to maintain body facing sideways.    Other Standing Knee Exercises  Retro walking 15' x 4 Min guard. Verbal cues to increase step length      Knee/Hip Exercises: Seated   Other Seated Knee/Hip Exercises  Heel/toe raise x 15 each    Sit to Sand  10 reps;without UE support;2 sets   2 sets from chair with foam pad on chair              PT Short Term Goals - 11/08/19 [id number]      PT SHORT TERM GOAL #1   Title  Patient and family will report understanding and regular compliance with HEP to improve balance, gait, strength, and overall functional mobility.    Time  3    Period  Weeks    Status  On-going    Target Date  11/23/19        PT Long Term Goals - 11/08/19 [id number]      PT LONG TERM GOAL #1   Title  Patient will demonstrate improvement of at least 5 points on the BBS indicating improved balance and to have less risk of falls.    Time  6    Period  Weeks    Status  On-going      PT LONG TERM GOAL #2   Title  Patient's will demonstrate ability to perform bed mobility with mod I.    Time  6    Period  Weeks    Status  On-going      PT LONG TERM GOAL #3   Title  Patient and family will be educated on options for options of safe showering and will report an improvement in ease of being able to shower by at least 50%.    Time  6    Period  Weeks    Status  On-going      PT LONG TERM GOAL #4   Title  Patient will demonstrate improvement of at least 50 feet while ambulating on the 2MWT indicating improved gait speed and safety to reduce falls.    Time  6    Period  Weeks    Status  On-going            Plan -  11/21/19 1336    Clinical Impression Statement  Began session with aerobic exercise on the Nustep which did incorporate upper end lower extremity alternating movement to improve reciprocal pattern for gait training. Also incorporated aerobic exercise to prime patient's brain for improved neuroplasticity and motor learning. Educated the patient's family on beginning aerobic exercise at home with a slow progression in amount of time and increasing intervals. Also added Hilda Blades to improve patient's ability to weight shift outside of BOS which patient did demonstrate some difficulty with shifting weight. Patient would benefit from continued skilled physical therapy to continue progressing towards functional goals.    Personal Factors and Comorbidities  Age;Comorbidity 2    Comorbidities  Dementia, HTN    Examination-Activity Limitations  Bathing;Stand;Bed Mobility;Locomotion Level;Toileting;Bend;Transfers;Carry;Squat;Stairs    Examination-Participation Restrictions  Yard Work;Cleaning;Meal Prep;Community Activity;Laundry;Shop    Stability/Clinical Decision Making  Evolving/Moderate complexity    Rehab Potential  Fair    PT Frequency  2x / week    PT Duration  6 weeks    PT Treatment/Interventions  ADLs/Self Care Home Management;Aquatic Therapy;Cryotherapy;Electrical Stimulation;Moist Heat;DME Instruction;Gait training;Stair training;Functional mobility training;Therapeutic activities;Therapeutic exercise;Balance training;Neuromuscular re-education;Patient/family education;Orthotic Fit/Training;Manual techniques;Passive range of motion;Energy conservation;Taping    PT Next Visit Plan  F/u on walk-in tub. "Longer steps" cueing with retro walking. Sit to stands without UE use, eccentric control. F/U if cueing to decrease sound of steps improved walking.    PT Home Exercise Plan  11/02/19: Seated marching slow and controlled x20 daily; 11/08/19: HS stretch, LTR; 11/14/19: Stepping over obstacles, walking to  not hear steps.       Patient will benefit from skilled therapeutic intervention in order to improve the following deficits and impairments:  Abnormal gait, Improper body mechanics, Decreased mobility, Postural dysfunction, Decreased activity tolerance, Decreased endurance, Decreased strength, Decreased balance, Difficulty walking, Impaired flexibility  Visit Diagnosis: Other abnormalities of gait and mobility  Unsteadiness on feet  Muscle weakness (generalized)     Problem List Patient Active Problem List   Diagnosis Date Noted  . PSP (progressive supranuclear  palsy) ([place]) 11/15/2019  . Gait abnormality 11/15/2019  . Movement disorder 05/18/2019  . Dementia ([place]) 11/10/2018   Clarene Critchley PT, DPT 1:40 PM, 11/21/19 [place] [address] [place], [place], [address] Phone: [phone]   Fax:  [phone]  Name: Luis Freeman MRN: [id number] Date of Birth: 30-Sep-1932

## 2019-11-21 NOTE — Therapy (Signed)
[place], [place], [address] Phone: [phone]   Fax:  [phone]  Speech Language Pathology Treatment  Patient Details  Name: Luis Freeman MRN: [id number] Date of Birth: November 07, 1932 Referring Provider (SLP): Regis Bill, MD   Encounter Date: 11/21/2019  End of Session - 11/21/19 1509    Visit Number  10    Number of Visits  11    Date for SLP Re-Evaluation  11/22/19    Authorization Type  [employer] Medicare Choice PPO    Authorization - Visit Number  9    Authorization - Number of Visits  10    SLP Start Time  [id number]    SLP Stop Time   1425    SLP Time Calculation (min)  48 min    Activity Tolerance  Patient tolerated treatment well       Past Medical History:  Diagnosis Date  . Chronic low back pain   . Dementia ([place])   . Gait abnormality 11/15/2019  . Gait disorder   . Hypertension   . PSP (progressive supranuclear palsy) ([place]) 11/15/2019  . Vitamin B12 deficiency     Past Surgical History:  Procedure Laterality Date  . CATARACT EXTRACTION, BILATERAL    . COLONOSCOPY    . COLONOSCOPY  02/04/2011   Procedure: COLONOSCOPY;  Surgeon: Daneil Dolin, MD;  [facility];  Service: Endoscopy;  Laterality: N/A;    There were no vitals filed for this visit.  Subjective Assessment - 11/21/19 1350    Subjective  "We went to [place]."    Patient is accompained by:  Family member    Currently in Pain?  No/denies       ADULT SLP TREATMENT - 11/21/19 1507      General Information   Behavior/Cognition  Alert;Cooperative;Pleasant mood    Patient Positioning  Upright in chair    Oral care provided  N/A    HPI  Mr. Luis Freeman is a 86 y.o. male who has a past medical history of Cancer (CMS-HCC), Dental caries, History of cataract surgery, History of ongoing treatment with alendronate (Fosamax), and Hypertension. presenting in consultation for evaluation of abnormal movements. Family began to notice  changes in speech in 2018 - not completing sentences, followed by episodes of confusion, losing track of thoughts. Now having significant work finding difficulties, poor attention. Often says "yes" when he means "no", and there is some echolalia. Around the same time has had abnormal movements described as fidgeting with the hands. Right arm elevates while he walks. Has trouble with fine motor skills and walking and balance, but no falls. He shuffles. His neurologist suspected CBD or other neurodegenerative condition. He was tried on Aricept but this caused nightmares so was stopped. Namenda has no side effects but is not clearly helping. On exam I see fairly symmetrical mild bradykinesia and rigidity, more rigidity in the neck, and ideomotor and visuospatial apraxia bilaterally. There are "fidgety" movements in the hands but no evidence of chorea (movemetns disappear when arms held out in front of him). 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 Dr. Regis Bill referred Pt for SLE and treatment.      Treatment Provided   Treatment provided  Cognitive-Linquistic      Pain Assessment   Pain Assessment  No/denies pain      Cognitive-Linquistic Treatment   Treatment focused on  Cognition;Aphasia;Dysarthria;Patient/family/caregiver education    Skilled Treatment  SLP facilitated session by providing personalized communication supports (written salient proper names) during Pt centered communication topics with family present for education. SLP modeled appropriate communication supports and cues for family when engaged in Pt centered conversation (previously lived in [place]) by allowing extra processing time, binary choices when needed, verbal queries to aide in error awareness, and encouragement for Pt to verbalize yes/no versus head nod/shake.        Assessment / Recommendations / Plan   Plan  Continue with current plan of care       Progression Toward Goals   Progression toward goals  Progressing toward goals         SLP Short Term Goals - 11/21/19 1511      SLP SHORT TERM GOAL #1   Title  Pt will implement speech intelligibility strategies at the sentence level with 90% acc with min cues.    Baseline  cues for increased intensity and respiration; 80%    Time  4    Period  Weeks    Status  On-going    Target Date  11/08/19      SLP SHORT TERM GOAL #2   Title  Pt will complete moderate level word retrieval activities with 90% acc when provided mi/mod cues.    Baseline  70%    Time  4    Period  Weeks    Status  Achieved    Target Date  11/08/19      SLP SHORT TERM GOAL #3   Title  Pt will complete screening for SPEAK OUT! treatment to see if Pt would be a good candidate.    Baseline  will complete next session    Time  2    Period  Weeks    Status  Achieved    Target Date  11/08/19      SLP SHORT TERM GOAL #4   Title  Pt will provide verbal description of pictured activities with 90% acc for content and fluency with use of strategies and min assist.    Baseline  Pt using rote sentences at this time    Time  4    Period  Weeks    Status  On-going    Target Date  11/08/19      SLP SHORT TERM GOAL #5   Title  Pt will complete moderate level planning and organization tasks with 90% acc with use of strategies and mi/mod assist.    Baseline  75%    Time  4    Period  Weeks    Status  Deferred    Target Date  11/08/19       SLP Long Term Goals - 11/09/19 1316      SLP LONG TERM GOAL #1   Title  Same as short       Plan - 11/21/19 1511    Clinical Impression Statement  Pt accompanied to therapy by his wife, Luis Freeman, and his daughter. Caregiver education was provided throughout the session by demonstrating communication strategies and appropriate cueing. Pt benefited from allowance for extra time, written cues, and offering binary choices. Pt will attend one more session.     Speech Therapy  Frequency  1x /week    Duration  2 weeks    Treatment/Interventions  Compensatory strategies;Cueing hierarchy;Functional tasks;Patient/family education;Cognitive reorganization;SLP instruction and feedback;Compensatory techniques    Potential to Achieve Goals  Good    SLP Home Exercise Plan  Pt will complete HEP as assigned to facilitate  carryover of treatment strategies and techniques in home environment with assist from family as needed.    Consulted and Agree with Plan of Care  Patient;Family member/caregiver    Family Member Consulted  Wife, Luis Freeman and daughter, Seth Bake       Patient will benefit from skilled therapeutic intervention in order to improve the following deficits and impairments:   Cognitive communication deficit    Problem List Patient Active Problem List   Diagnosis Date Noted  . PSP (progressive supranuclear palsy) ([place]) 11/15/2019  . Gait abnormality 11/15/2019  . Movement disorder 05/18/2019  . Dementia [facility]) 11/10/2018   Thank you,  Genene Churn, [place]  [facility] 11/21/2019, 3:20 PM  [place] [address] [place], [place], [address] Phone: [phone]   Fax:  [phone]   Name: KARLIS COURTENAY MRN: [id number] Date of Birth: 10/16/1932

## 2019-11-22 ENCOUNTER — Encounter (HOSPITAL_COMMUNITY): Payer: Self-pay | Admitting: Speech Pathology

## 2019-11-22 ENCOUNTER — Ambulatory Visit (HOSPITAL_COMMUNITY): Payer: Medicare PPO | Admitting: Speech Pathology

## 2019-11-22 DIAGNOSIS — R2681 Unsteadiness on feet: Secondary | ICD-10-CM | POA: Diagnosis not present

## 2019-11-22 DIAGNOSIS — R41841 Cognitive communication deficit: Secondary | ICD-10-CM

## 2019-11-22 DIAGNOSIS — R2689 Other abnormalities of gait and mobility: Secondary | ICD-10-CM | POA: Diagnosis not present

## 2019-11-22 DIAGNOSIS — M6281 Muscle weakness (generalized): Secondary | ICD-10-CM | POA: Diagnosis not present

## 2019-11-22 NOTE — Therapy (Signed)
[place], [place], [address] Phone: [phone]   Fax:  [phone]  Speech Language Pathology Treatment  Patient Details  Name: Luis Freeman MRN: [id number] Date of Birth: [birth year] Referring Provider (SLP): Regis Bill, MD   Encounter Date: 11/22/2019  End of Session - 11/22/19 1129    Visit Number  11    Number of Visits  11    Date for SLP Re-Evaluation  11/22/19    Authorization Type  [employer] Medicare Choice PPO    Authorization - Visit Number  10    Authorization - Number of Visits  10    SLP Start Time  1033    SLP Stop Time   1122    SLP Time Calculation (min)  49 min    Activity Tolerance  Patient tolerated treatment well       Past Medical History:  Diagnosis Date  . Chronic low back pain   . Dementia ([place])   . Gait abnormality 11/15/2019  . Gait disorder   . Hypertension   . PSP (progressive supranuclear palsy) ([place]) 11/15/2019  . Vitamin B12 deficiency     Past Surgical History:  Procedure Laterality Date  . CATARACT EXTRACTION, BILATERAL    . COLONOSCOPY    . COLONOSCOPY  02/04/2011   Procedure: COLONOSCOPY;  Surgeon: Daneil Dolin, MD;  [facility];  Service: Endoscopy;  Laterality: N/A;    There were no vitals filed for this visit.  Subjective Assessment - 11/22/19 1127    Subjective  "We went to [facility] [employer]."    Patient is accompained by:  Family member    Currently in Pain?  No/denies       ADULT SLP TREATMENT - 11/22/19 1128      General Information   Behavior/Cognition  Alert;Cooperative;Pleasant mood    Patient Positioning  Upright in chair    Oral care provided  N/A    HPI  Luis Freeman is a 86 y.o. male who has a past medical history of Cancer (CMS-HCC), Dental caries, History of cataract surgery, History of ongoing treatment with alendronate (Fosamax), and Hypertension. presenting in consultation for evaluation of abnormal movements. Family began to  notice changes in speech in 2018 - not completing sentences, followed by episodes of confusion, losing track of thoughts. Now having significant work finding difficulties, poor attention. Often says "yes" when he means "no", and there is some echolalia. Around the same time has had abnormal movements described as fidgeting with the hands. Right arm elevates while he walks. Has trouble with fine motor skills and walking and balance, but no falls. He shuffles. His neurologist suspected CBD or other neurodegenerative condition. He was tried on Aricept but this caused nightmares so was stopped. Namenda has no side effects but is not clearly helping. On exam I see fairly symmetrical mild bradykinesia and rigidity, more rigidity in the neck, and ideomotor and visuospatial apraxia bilaterally. There are "fidgety" movements in the hands but no evidence of chorea (movemetns disappear when arms held out in front of him). 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 Dr. Regis Bill referred Pt for SLE and treatment.      Treatment Provided   Treatment provided  Cognitive-Linquistic      Pain Assessment   Pain Assessment  No/denies pain      Cognitive-Linquistic Treatment   Treatment focused on  Cognition;Aphasia;Dysarthria;Patient/family/caregiver education    Skilled  Treatment   SLP facilitated session by providing personalized communication supports (written salient proper names) during Pt centered communication topics with family present for education. SLP modeled appropriate communication supports and cues for family when engaged in Pt centered conversation by allowing extra processing time, binary choices when needed, verbal queries to aide in error awareness, and encouragement for Pt to verbalize yes/no versus head nod/shake.         Assessment / Recommendations / Plan   Plan  All goals met;Discharge SLP treatment due to (comment)       Progression Toward Goals   Progression toward goals  Goals met, education completed, patient discharged from SLP       SLP Education - 11/22/19 1129    Education Details  Provided personalized printout of communication strategies    Person(s) Educated  Patient;Spouse;Child(ren)    Methods  Explanation;Handout    Comprehension  Verbalized understanding       SLP Short Term Goals - 11/22/19 1131      SLP SHORT TERM GOAL #1   Title  Pt will implement speech intelligibility strategies at the sentence level with 90% acc with min cues.    Baseline  cues for increased intensity and respiration; 80%    Time  4    Period  Weeks    Status  Achieved    Target Date  11/08/19      SLP SHORT TERM GOAL #2   Title  Pt will complete moderate level word retrieval activities with 90% acc when provided mi/mod cues.    Baseline  70%    Time  4    Period  Weeks    Status  Achieved    Target Date  11/08/19      SLP SHORT TERM GOAL #3   Title  Pt will complete screening for SPEAK OUT! treatment to see if Pt would be a good candidate.    Baseline  will complete next session    Time  2    Period  Weeks    Status  Achieved    Target Date  11/08/19      SLP SHORT TERM GOAL #4   Title  Pt will provide verbal description of pictured activities with 90% acc for content and fluency with use of strategies and min assist.    Baseline  Pt using rote sentences at this time    Time  4    Period  Weeks    Status  Achieved    Target Date  11/08/19      SLP SHORT TERM GOAL #5   Title  Pt will complete moderate level planning and organization tasks with 90% acc with use of strategies and mi/mod assist.    Baseline  75%    Time  4    Period  Weeks    Status  Deferred    Target Date  11/08/19       SLP Long Term Goals - 11/09/19 1316      SLP LONG TERM GOAL #1   Title  Same as short       Plan - 11/22/19 1131    Clinical Impression Statement  Pt accompanied to therapy by his wife, [name], and  his daughter. Caregiver education was provided throughout the session by demonstrating communication strategies and appropriate cueing. Pt benefited from allowance for extra time, written cues, and offering binary choices. Please see below for reminders and recommendations going forward. Pt may benefit from additional SLP  services in the future should his communication needs change. Pt will be discharged from SLP services at this time.   Speech Therapy Frequency  1x /week    Duration  2 weeks    Treatment/Interventions  Compensatory strategies;Cueing hierarchy;Functional tasks;Patient/family education;Cognitive reorganization;SLP instruction and feedback;Compensatory techniques    Potential to Achieve Goals  Good    SLP Home Exercise Plan  Pt will complete HEP as assigned to facilitate carryover of treatment strategies and techniques in home environment with assist from family as needed.    Consulted and Agree with Plan of Care  Patient;Family member/caregiver    Family Member Consulted  Wife, [name] and daughter, Percell Locus . Expected changes: slower processing, delayed responses, slurred speech (dysarthria), difficulty sequencing thoughts, personality changes, reduced attention and concentration, difficulty shifting attention in task/conversations . establishing routines (exercise, medication, self care) . use written cues (use word bank, piece of paper to write down key words to guide the conversation) . provide two choices and also use visual/gestural cues ("Do you want [place] or [place]?" and hold out left and right hand for each choice) . Encourage him to verbalize "yes" or "no" despite head nod/shake . Repeat misunderstood information with, "You said, yes, you want [place]. Is that what you mean?" . Give him extra time . Encourage others to speak to him and remind others that he understands well, but needs extra time to process and respond . Seek a quiet environment with limited  distractions when visiting with friends/family . Using written cues during important conversations will allow him to better track the conversation and elaborate . Play some games, but keep them short  . Keep in mind that his needs may change/evolve . You might need to revisit therapy again in the future . Use it or lose it!   Patient will benefit from skilled therapeutic intervention in order to improve the following deficits and impairments:   Cognitive communication deficit    Problem List Patient Active Problem List   Diagnosis Date Noted  . PSP (progressive supranuclear palsy) ([place]) 11/15/2019  . Gait abnormality 11/15/2019  . Movement disorder 05/18/2019  . Dementia ([place]) 11/10/2018   SPEECH THERAPY DISCHARGE SUMMARY  Visits from Start of Care: 11  Current functional level related to goals / functional outcomes: See above   Remaining deficits: See above   Education / Equipment: Pt discharged with written communication supports and HEP  Plan: Patient agrees to discharge.  Patient goals were met. Patient is being discharged due to meeting the stated rehab goals.  ?????        Thank you,  Genene Churn, [place]  [facility] 11/22/2019, 11:34 AM  [place] [address] [place], [place], [address] Phone: [phone]   Fax:  [phone]   Name: KALUM MINNER MRN: [id number] Date of Birth: April 28, 1933

## 2019-11-22 NOTE — Patient Instructions (Signed)
Reminders . Expected changes: slower processing, delayed responses, slurred speech (dysarthria), difficulty sequencing thoughts, personality changes, reduced attention and concentration, difficulty shifting attention in task/conversations . establishing routines (exercise, medication, self care) . use written cues (use word bank, piece of paper to write down key words to guide the conversation) . provide two choices and also use visual/gestural cues ("Do you want [place] or [place]?" and hold out left and right hand for each choice) . Encourage him to verbalize "yes" or "no" despite head nod/shake . Repeat misunderstood information with, "You said, yes, you want [place]. Is that what you mean?" . Give him extra time . Encourage others to speak to him and remind others that he understands well, but needs extra time to process and respond . Seek a quiet environment with limited distractions when visiting with friends/family . Using written cues during important conversations will allow him to better track the conversation and elaborate . Play some games, but keep them short  . Keep in mind that his needs may change/evolve . You might need to revisit therapy again in the future . Use it or lose it!

## 2019-11-23 ENCOUNTER — Encounter (HOSPITAL_COMMUNITY): Payer: Self-pay | Admitting: Physical Therapy

## 2019-11-23 ENCOUNTER — Other Ambulatory Visit: Payer: Self-pay

## 2019-11-23 ENCOUNTER — Ambulatory Visit (HOSPITAL_COMMUNITY): Payer: Medicare PPO | Admitting: Physical Therapy

## 2019-11-23 DIAGNOSIS — R41841 Cognitive communication deficit: Secondary | ICD-10-CM | POA: Diagnosis not present

## 2019-11-23 DIAGNOSIS — M6281 Muscle weakness (generalized): Secondary | ICD-10-CM

## 2019-11-23 DIAGNOSIS — R2681 Unsteadiness on feet: Secondary | ICD-10-CM | POA: Diagnosis not present

## 2019-11-23 DIAGNOSIS — R2689 Other abnormalities of gait and mobility: Secondary | ICD-10-CM | POA: Diagnosis not present

## 2019-11-23 NOTE — Therapy (Signed)
[place], [place], [address] Phone: [phone]   Fax:  [phone]  Physical Therapy Treatment  Patient Details  Name: Luis Freeman MRN: [id number] Date of Birth: 09-26-1932 Referring Provider (PT): Dr. Regis Bill   Encounter Date: 11/23/2019  PT End of Session - 11/23/19 1439    Visit Number  7    Number of Visits  12    Date for PT Re-Evaluation  12/14/19    Authorization Type  [employer] (No visit limit, authorization required)    Progress Note Due on Visit  10    PT Start Time  1433    PT Stop Time  1515    PT Time Calculation (min)  42 min    Equipment Utilized During Treatment  Gait belt    Activity Tolerance  Patient tolerated treatment well    Behavior During Therapy  [facility] for tasks assessed/performed;Flat affect       Past Medical History:  Diagnosis Date  . Chronic low back pain   . Dementia ([place])   . Gait abnormality 11/15/2019  . Gait disorder   . Hypertension   . PSP (progressive supranuclear palsy) ([place]) 11/15/2019  . Vitamin B12 deficiency     Past Surgical History:  Procedure Laterality Date  . CATARACT EXTRACTION, BILATERAL    . COLONOSCOPY    . COLONOSCOPY  02/04/2011   Procedure: COLONOSCOPY;  Surgeon: Daneil Dolin, MD;  [facility];  Service: Endoscopy;  Laterality: N/A;    There were no vitals filed for this visit.  Subjective Assessment - 11/23/19 1439    Subjective  Patient denied any pain. He reported that they got the walk-in tub but have not been able to try it yet.    Patient is accompained by:  Family member   Wife and caregiver   Pertinent History  Progressive decrease in balance and ambulation speed    Limitations  Walking;Standing;House hold activities    Patient Stated Goals  Walking better, improving his balance and helping him get into the shower    Currently in Pain?  No/denies                       [facility] Adult PT  Treatment/Exercise - 11/23/19 0001      Knee/Hip Exercises: Aerobic   Nustep  4 minutes: Level 2 with LE and UE movement for improved priming to motor learning and neuroplasticity      Knee/Hip Exercises: Standing   Other Standing Knee Exercises  Toe taps on 6'' step with 2 sticky notes as visual cue 2x15 repetitions    Other Standing Knee Exercises  Retro walking 15' x 2 Min guard and x 1 with sticky notes for step length cues. Forward ambulation with sticky notes as visual cue for step-length x 2 min guard.       Knee/Hip Exercises: Seated   Sit to Sand  Other (comment);without UE support   Seated on foam. HIIT repetitions of 30'' and then 60''x2              PT Short Term Goals - 11/08/19 [id number]      PT SHORT TERM GOAL #1   Title  Patient and family will report understanding and regular compliance with HEP to improve balance, gait, strength, and overall functional mobility.    Time  3    Period  Weeks    Status  On-going  Target Date  11/23/19        PT Long Term Goals - 11/08/19 [id number]      PT LONG TERM GOAL #1   Title  Patient will demonstrate improvement of at least 5 points on the BBS indicating improved balance and to have less risk of falls.    Time  6    Period  Weeks    Status  On-going      PT LONG TERM GOAL #2   Title  Patient's will demonstrate ability to perform bed mobility with mod I.    Time  6    Period  Weeks    Status  On-going      PT LONG TERM GOAL #3   Title  Patient and family will be educated on options for options of safe showering and will report an improvement in ease of being able to shower by at least 50%.    Time  6    Period  Weeks    Status  On-going      PT LONG TERM GOAL #4   Title  Patient will demonstrate improvement of at least 50 feet while ambulating on the 2MWT indicating improved gait speed and safety to reduce falls.    Time  6    Period  Weeks    Status  On-going            Plan - 11/23/19 1557    Clinical  Impression Statement  Focused on improving gait and step length this session. Added toe taps on 6'' step this session to improve foot clearance. Worked on retro walking with verbal cues for "long steps" as well as sticky notes for visual cues. Patient did not do well with visual cues with retro walking, but with anterior walking, the patient demonstrated excellent improvement in step length with therapist providing facilitation for a weight shift. Also incorporated HIIT this session with timed sit to stands to improve patient's power. Patient would benefit from continued skilled physical therapy to continue progressing towards functional goals.    Personal Factors and Comorbidities  Age;Comorbidity 2    Comorbidities  Dementia, HTN    Examination-Activity Limitations  Bathing;Stand;Bed Mobility;Locomotion Level;Toileting;Bend;Transfers;Carry;Squat;Stairs    Examination-Participation Restrictions  Yard Work;Cleaning;Meal Prep;Community Activity;Laundry;Shop    Stability/Clinical Decision Making  Evolving/Moderate complexity    Rehab Potential  Fair    PT Frequency  2x / week    PT Duration  6 weeks    PT Treatment/Interventions  ADLs/Self Care Home Management;Aquatic Therapy;Cryotherapy;Electrical Stimulation;Moist Heat;DME Instruction;Gait training;Stair training;Functional mobility training;Therapeutic activities;Therapeutic exercise;Balance training;Neuromuscular re-education;Patient/family education;Orthotic Fit/Training;Manual techniques;Passive range of motion;Energy conservation;Taping    PT Next Visit Plan  Sticky notes for anterior walking. HIIT with functional activities. "Longer steps" cueing with retro walking. Sit to stands without UE use, eccentric control. F/U if cueing to decrease sound of steps improved walking.    PT Home Exercise Plan  11/02/19: Seated marching slow and controlled x20 daily; 11/08/19: HS stretch, LTR; 11/14/19: Stepping over obstacles, walking to not hear steps.        Patient will benefit from skilled therapeutic intervention in order to improve the following deficits and impairments:  Abnormal gait, Improper body mechanics, Decreased mobility, Postural dysfunction, Decreased activity tolerance, Decreased endurance, Decreased strength, Decreased balance, Difficulty walking, Impaired flexibility  Visit Diagnosis: Other abnormalities of gait and mobility  Unsteadiness on feet  Muscle weakness (generalized)     Problem List Patient Active Problem List   Diagnosis Date Noted  .  PSP (progressive supranuclear palsy) ([place]) 11/15/2019  . Gait abnormality 11/15/2019  . Movement disorder 05/18/2019  . Dementia ([place]) 11/10/2018   Clarene Critchley PT, DPT 4:00 PM, 11/23/19 [place] [address] [place], [place], [address] Phone: [phone]   Fax:  [phone]  Name: DAMEIR LUCHINI MRN: [id number] Date of Birth: 1933-05-27

## 2019-11-28 ENCOUNTER — Ambulatory Visit (HOSPITAL_COMMUNITY): Payer: Medicare PPO | Admitting: Physical Therapy

## 2019-11-28 ENCOUNTER — Other Ambulatory Visit: Payer: Self-pay

## 2019-11-28 ENCOUNTER — Encounter (HOSPITAL_COMMUNITY): Payer: Self-pay | Admitting: Physical Therapy

## 2019-11-28 DIAGNOSIS — R41841 Cognitive communication deficit: Secondary | ICD-10-CM | POA: Diagnosis not present

## 2019-11-28 DIAGNOSIS — M6281 Muscle weakness (generalized): Secondary | ICD-10-CM

## 2019-11-28 DIAGNOSIS — R2681 Unsteadiness on feet: Secondary | ICD-10-CM | POA: Diagnosis not present

## 2019-11-28 DIAGNOSIS — R2689 Other abnormalities of gait and mobility: Secondary | ICD-10-CM | POA: Diagnosis not present

## 2019-11-28 NOTE — Therapy (Signed)
[place], [place], [address] Phone: [phone]   Fax:  [phone]  Physical Therapy Treatment  Patient Details  Name: Luis Freeman MRN: [id number] Date of Birth: December 26, 1932 Referring Provider (PT): Dr. Regis Bill   Encounter Date: 11/28/2019  PT End of Session - 11/28/19 1313    Visit Number  8    Number of Visits  12    Date for PT Re-Evaluation  12/14/19    Authorization Type  [employer] (No visit limit, authorization required)    Progress Note Due on Visit  10    PT Start Time  1118    PT Stop Time  1200    PT Time Calculation (min)  42 min    Equipment Utilized During Treatment  Gait belt    Activity Tolerance  Patient tolerated treatment well    Behavior During Therapy  [facility] for tasks assessed/performed;Flat affect       Past Medical History:  Diagnosis Date  . Chronic low back pain   . Dementia ([place])   . Gait abnormality 11/15/2019  . Gait disorder   . Hypertension   . PSP (progressive supranuclear palsy) ([place]) 11/15/2019  . Vitamin B12 deficiency     Past Surgical History:  Procedure Laterality Date  . CATARACT EXTRACTION, BILATERAL    . COLONOSCOPY    . COLONOSCOPY  02/04/2011   Procedure: COLONOSCOPY;  Surgeon: Daneil Dolin, MD;  [facility];  Service: Endoscopy;  Laterality: N/A;    There were no vitals filed for this visit.  Subjective Assessment - 11/28/19 1129    Subjective  Patient denied any pain or any recent falls.    Patient is accompained by:  Family member   Wife and caregiver   Pertinent History  Progressive decrease in balance and ambulation speed    Limitations  Walking;Standing;House hold activities    Patient Stated Goals  Walking better, improving his balance and helping him get into the shower    Currently in Pain?  No/denies                       [facility] Adult PT Treatment/Exercise - 11/28/19 0001      Knee/Hip Exercises:  Standing   Forward Step Up  Right;Left;1 set;10 reps;Hand Hold: 2;Step Height: 6"    Forward Step Up Limitations  Cues for sequencing    Other Standing Knee Exercises  Ambulating in agility ladder x4 cues for 1 foot in each square. Toe taps on 6'' step with 2 sticky notes as visual cue 2x60''     Other Standing Knee Exercises  Retro walking 15' x 2 Min guard. Forward ambulation with sticky notes as visual cue for step-length x 4 min guard and facilitation for weight shift.       Knee/Hip Exercises: Seated   Sit to Sand  Other (comment);without UE support   Seated on foam 2 x 60'' holding basketball cue for no UE              PT Short Term Goals - 11/08/19 [id number]      PT SHORT TERM GOAL #1   Title  Patient and family will report understanding and regular compliance with HEP to improve balance, gait, strength, and overall functional mobility.    Time  3    Period  Weeks    Status  On-going    Target Date  11/23/19  PT Long Term Goals - 11/08/19 [id number]      PT LONG TERM GOAL #1   Title  Patient will demonstrate improvement of at least 5 points on the BBS indicating improved balance and to have less risk of falls.    Time  6    Period  Weeks    Status  On-going      PT LONG TERM GOAL #2   Title  Patient's will demonstrate ability to perform bed mobility with mod I.    Time  6    Period  Weeks    Status  On-going      PT LONG TERM GOAL #3   Title  Patient and family will be educated on options for options of safe showering and will report an improvement in ease of being able to shower by at least 50%.    Time  6    Period  Weeks    Status  On-going      PT LONG TERM GOAL #4   Title  Patient will demonstrate improvement of at least 50 feet while ambulating on the 2MWT indicating improved gait speed and safety to reduce falls.    Time  6    Period  Weeks    Status  On-going            Plan - 11/28/19 1341    Clinical Impression Statement  Continued to  focus on ambulation, balance, and improving strength with interval training. Added agility ladder this session with noted difficulty with following demonstration and cueing to place 1 foot in each both with ambulation. Patient does require continued cueing to place cane down with ambulation. Patient would benefit from continued skilled physical therapy to continue progressing towards functional goals.    Personal Factors and Comorbidities  Age;Comorbidity 2    Comorbidities  Dementia, HTN    Examination-Activity Limitations  Bathing;Stand;Bed Mobility;Locomotion Level;Toileting;Bend;Transfers;Carry;Squat;Stairs    Examination-Participation Restrictions  Yard Work;Cleaning;Meal Prep;Community Activity;Laundry;Shop    Stability/Clinical Decision Making  Evolving/Moderate complexity    Rehab Potential  Fair    PT Frequency  2x / week    PT Duration  6 weeks    PT Treatment/Interventions  ADLs/Self Care Home Management;Aquatic Therapy;Cryotherapy;Electrical Stimulation;Moist Heat;DME Instruction;Gait training;Stair training;Functional mobility training;Therapeutic activities;Therapeutic exercise;Balance training;Neuromuscular re-education;Patient/family education;Orthotic Fit/Training;Manual techniques;Passive range of motion;Energy conservation;Taping    PT Next Visit Plan  Sticky notes for anterior walking. HIIT with functional activities. "Longer steps" cueing with retro walking. Sit to stands without UE use, eccentric control.    PT Home Exercise Plan  11/02/19: Seated marching slow and controlled x20 daily; 11/08/19: HS stretch, LTR; 11/14/19: Stepping over obstacles, walking to not hear steps.       Patient will benefit from skilled therapeutic intervention in order to improve the following deficits and impairments:  Abnormal gait, Improper body mechanics, Decreased mobility, Postural dysfunction, Decreased activity tolerance, Decreased endurance, Decreased strength, Decreased balance, Difficulty  walking, Impaired flexibility  Visit Diagnosis: Other abnormalities of gait and mobility  Unsteadiness on feet  Muscle weakness (generalized)     Problem List Patient Active Problem List   Diagnosis Date Noted  . PSP (progressive supranuclear palsy) ([place]) 11/15/2019  . Gait abnormality 11/15/2019  . Movement disorder 05/18/2019  . Dementia ([place]) 11/10/2018   Clarene Critchley PT, DPT 1:42 PM, 11/28/19 [place] [address] [place], [place], [address] Phone: [phone]   Fax:  [phone]  Name: Luis Freeman MRN: [id number]  Date of Birth: 1933/05/03

## 2019-11-29 ENCOUNTER — Ambulatory Visit (HOSPITAL_COMMUNITY): Payer: Medicare PPO | Admitting: Speech Pathology

## 2019-11-30 ENCOUNTER — Encounter (HOSPITAL_COMMUNITY): Payer: Self-pay | Admitting: Physical Therapy

## 2019-11-30 ENCOUNTER — Other Ambulatory Visit: Payer: Self-pay

## 2019-11-30 ENCOUNTER — Ambulatory Visit (HOSPITAL_COMMUNITY): Payer: Medicare PPO | Admitting: Physical Therapy

## 2019-11-30 DIAGNOSIS — R41841 Cognitive communication deficit: Secondary | ICD-10-CM | POA: Diagnosis not present

## 2019-11-30 DIAGNOSIS — R2689 Other abnormalities of gait and mobility: Secondary | ICD-10-CM

## 2019-11-30 DIAGNOSIS — M6281 Muscle weakness (generalized): Secondary | ICD-10-CM

## 2019-11-30 DIAGNOSIS — R2681 Unsteadiness on feet: Secondary | ICD-10-CM

## 2019-11-30 NOTE — Therapy (Signed)
[place] [address] [place], [place], [address] Phone: [phone]   Fax:  [phone]  Physical Therapy Treatment  Patient Details  Name: Luis Freeman MRN: [id number] Date of Birth: 02-27-1933 Referring Provider (PT): Dr. Regis Bill   Encounter Date: 11/30/2019  PT End of Session - 11/30/19 1136    Visit Number  9    Number of Visits  12    Date for PT Re-Evaluation  12/14/19    Authorization Type  [employer] (No visit limit, authorization required)    Progress Note Due on Visit  10    PT Start Time  1120    PT Stop Time  1200    PT Time Calculation (min)  40 min    Equipment Utilized During Treatment  Gait belt    Activity Tolerance  Patient tolerated treatment well    Behavior During Therapy  [facility] for tasks assessed/performed;Flat affect       Past Medical History:  Diagnosis Date  . Chronic low back pain   . Dementia ([place])   . Gait abnormality 11/15/2019  . Gait disorder   . Hypertension   . PSP (progressive supranuclear palsy) ([place]) 11/15/2019  . Vitamin B12 deficiency     Past Surgical History:  Procedure Laterality Date  . CATARACT EXTRACTION, BILATERAL    . COLONOSCOPY    . COLONOSCOPY  02/04/2011   Procedure: COLONOSCOPY;  Surgeon: Daneil Dolin, MD;  [facility];  Service: Endoscopy;  Laterality: N/A;    There were no vitals filed for this visit.  Subjective Assessment - 11/30/19 1234    Subjective  Patient denied any pain. Reported sliding off of the bed this morning.    Patient is accompained by:  Family member   Wife and caregiver   Pertinent History  Progressive decrease in balance and ambulation speed    Limitations  Walking;Standing;House hold activities    Patient Stated Goals  Walking better, improving his balance and helping him get into the shower    Currently in Pain?  No/denies                        [facility] Adult PT Treatment/Exercise - 11/30/19 0001       Ambulation/Gait   Ambulation/Gait  Yes    Ambulation Distance (Feet)  226 Feet    Assistive device  None    Stairs  Yes    Stair Management Technique  Two rails    Number of Stairs  4   x3   Height of Stairs  7    Gait Comments  Min guard. Cues for proper form with ambulation. Stairs cues for reciprocal pattern.       Knee/Hip Exercises: Seated   Sit to Sand  Other (comment);without UE support   Without foam 2x60 seconds     Shoulder Exercises: Standing   Other Standing Exercises  Trunk rotation punching across midline 60'' x 2    Other Standing Exercises  Sidestepping 15' x 3 RT             PT Education - 11/30/19 1238    Education Details  Educated on benefits of trunk rotation on improving ambulation mechanics.    Person(s) Educated  Patient;Spouse;Caregiver(s)    Methods  Explanation    Comprehension  Verbalized understanding       PT Short Term Goals - 11/08/19 0821      PT SHORT  TERM GOAL #1   Title  Patient and family will report understanding and regular compliance with HEP to improve balance, gait, strength, and overall functional mobility.    Time  3    Period  Weeks    Status  On-going    Target Date  11/23/19        PT Long Term Goals - 11/08/19 [id number]      PT LONG TERM GOAL #1   Title  Patient will demonstrate improvement of at least 5 points on the BBS indicating improved balance and to have less risk of falls.    Time  6    Period  Weeks    Status  On-going      PT LONG TERM GOAL #2   Title  Patient's will demonstrate ability to perform bed mobility with mod I.    Time  6    Period  Weeks    Status  On-going      PT LONG TERM GOAL #3   Title  Patient and family will be educated on options for options of safe showering and will report an improvement in ease of being able to shower by at least 50%.    Time  6    Period  Weeks    Status  On-going      PT LONG TERM GOAL #4   Title  Patient will demonstrate improvement of at least 50  feet while ambulating on the 2MWT indicating improved gait speed and safety to reduce falls.    Time  6    Period  Weeks    Status  On-going            Plan - 11/30/19 1244    Clinical Impression Statement  Patient demonstrated excellent improvement in step length with ambulation this session. Introduced trunk rotation to improve arm swing and improve gait mechanics. Patient required cueing to step down the stairs with a reciprocal gait pattern. Discussed [employer] which is a Teacher, early years/pre for people with Parkinson-like symptoms and provided a handout to consider doing this in the future if interested. Patient also performed sit to stands this session from a standard height chair.    Personal Factors and Comorbidities  Age;Comorbidity 2    Comorbidities  Dementia, HTN    Examination-Activity Limitations  Bathing;Stand;Bed Mobility;Locomotion Level;Toileting;Bend;Transfers;Carry;Squat;Stairs    Examination-Participation Restrictions  Yard Work;Cleaning;Meal Prep;Community Activity;Laundry;Shop    Stability/Clinical Decision Making  Evolving/Moderate complexity    Rehab Potential  Fair    PT Frequency  2x / week    PT Duration  6 weeks    PT Treatment/Interventions  ADLs/Self Care Home Management;Aquatic Therapy;Cryotherapy;Electrical Stimulation;Moist Heat;DME Instruction;Gait training;Stair training;Functional mobility training;Therapeutic activities;Therapeutic exercise;Balance training;Neuromuscular re-education;Patient/family education;Orthotic Fit/Training;Manual techniques;Passive range of motion;Energy conservation;Taping    PT Next Visit Plan  Postural strengthening. Ambulation working on step length    PT Home Exercise Plan  11/02/19: Seated marching slow and controlled x20 daily; 11/08/19: HS stretch, LTR; 11/14/19: Stepping over obstacles, walking to not hear steps.       Patient will benefit from skilled therapeutic intervention in order to improve the following  deficits and impairments:  Abnormal gait, Improper body mechanics, Decreased mobility, Postural dysfunction, Decreased activity tolerance, Decreased endurance, Decreased strength, Decreased balance, Difficulty walking, Impaired flexibility  Visit Diagnosis: Other abnormalities of gait and mobility  Unsteadiness on feet  Muscle weakness (generalized)     Problem List Patient Active Problem List   Diagnosis Date Noted  . PSP (  progressive supranuclear palsy) ([place]) 11/15/2019  . Gait abnormality 11/15/2019  . Movement disorder 05/18/2019  . Dementia ([place]) 11/10/2018   Clarene Critchley PT, DPT 12:46 PM, 11/30/19 [place] [address] [place], [place], [address] Phone: [phone]   Fax:  [phone]  Name: Luis Freeman MRN: [id number] Date of Birth: 07-21-1932

## 2019-12-01 DIAGNOSIS — R609 Edema, unspecified: Secondary | ICD-10-CM | POA: Diagnosis not present

## 2019-12-01 DIAGNOSIS — G259 Extrapyramidal and movement disorder, unspecified: Secondary | ICD-10-CM | POA: Diagnosis not present

## 2019-12-01 DIAGNOSIS — E538 Deficiency of other specified B group vitamins: Secondary | ICD-10-CM | POA: Diagnosis not present

## 2019-12-05 ENCOUNTER — Encounter (HOSPITAL_COMMUNITY): Payer: Self-pay | Admitting: Physical Therapy

## 2019-12-05 ENCOUNTER — Ambulatory Visit (HOSPITAL_COMMUNITY): Payer: Medicare PPO | Admitting: Physical Therapy

## 2019-12-05 ENCOUNTER — Other Ambulatory Visit: Payer: Self-pay

## 2019-12-05 DIAGNOSIS — R41841 Cognitive communication deficit: Secondary | ICD-10-CM | POA: Diagnosis not present

## 2019-12-05 DIAGNOSIS — M6281 Muscle weakness (generalized): Secondary | ICD-10-CM

## 2019-12-05 DIAGNOSIS — R2681 Unsteadiness on feet: Secondary | ICD-10-CM

## 2019-12-05 DIAGNOSIS — R2689 Other abnormalities of gait and mobility: Secondary | ICD-10-CM

## 2019-12-05 NOTE — Therapy (Addendum)
[facility] [address] [place], [place], [address] Phone: [phone]   Fax:  [phone]  Physical Therapy Treatment / Progress Note  Patient Details  Name: Luis Freeman MRN: [id number] Date of Birth: September 20, 1932 Referring Provider (PT): Dr. Regis Bill   Encounter Date: 12/05/2019   Progress Note Reporting Period 11/02/19 to 12/05/19  See note below for Objective Data and Assessment of Progress/Goals.       PT End of Session - 12/05/19 1254    Visit Number  10    Number of Visits  12    Date for PT Re-Evaluation  12/14/19    Authorization Type  [employer] (No visit limit, authorization required)    Progress Note Due on Visit  12    PT Start Time  1110    PT Stop Time  1150    PT Time Calculation (min)  40 min    Equipment Utilized During Treatment  Gait belt    Activity Tolerance  Patient tolerated treatment well    Behavior During Therapy  WFL for tasks assessed/performed;Flat affect       Past Medical History:  Diagnosis Date  . Chronic low back pain   . Dementia ([place])   . Gait abnormality 11/15/2019  . Gait disorder   . Hypertension   . PSP (progressive supranuclear palsy) ([place]) 11/15/2019  . Vitamin B12 deficiency     Past Surgical History:  Procedure Laterality Date  . CATARACT EXTRACTION, BILATERAL    . COLONOSCOPY    . COLONOSCOPY  02/04/2011   Procedure: COLONOSCOPY;  Surgeon: Daneil Dolin, MD;  [facility];  Service: Endoscopy;  Laterality: N/A;    There were no vitals filed for this visit.  Subjective Assessment - 12/05/19 1113    Subjective  Patient denied any pain. Reported that he hasn't had any falls. Patient has been able to get into the walk-in tub and used it without issues so far.    Patient is accompained by:  Family member   Wife and caregiver   Pertinent History  Progressive decrease in balance and ambulation speed    Limitations  Walking;Standing;House hold activities     Patient Stated Goals  Walking better, improving his balance and helping him get into the shower    Currently in Pain?  No/denies         [facility] PT Assessment - 12/05/19 0001      Assessment   Medical Diagnosis  Progressive Supranuclear Palsy    Referring Provider (PT)  Dr. Regis Bill      Home Environment   Living Environment  Private residence    Living Arrangements  Spouse/significant other    Type of [place] entrance    [place] to live on main level with bedroom/bathroom    Home Equipment  [place] - quad      Prior Function   Level of Independence  Requires assistive device for independence      Cognition   Overall Cognitive Status  Impaired/Different from baseline      Bed Mobility   Bed Mobility  Supine to Sit;Sit to Supine    Supine to Sit  --   Mod I   Sit to Supine  --   Mod I     Ambulation/Gait   Ambulation/Gait  Yes    Ambulation Distance (Feet)  200 Feet   2MWT  Assistive device  Small based quad cane    Gait Comments  Was 175 feet . Requires cueing to use cane correctly.      Berg Balance Test   Sit to Stand  Able to stand without using hands and stabilize independently    Standing Unsupported  Able to stand safely 2 minutes    Sitting with Back Unsupported but Feet Supported on Floor or Stool  Able to sit safely and securely 2 minutes    Stand to Sit  Sits safely with minimal use of hands    Transfers  Able to transfer safely, minor use of hands    Standing Unsupported with Eyes Closed  Able to stand 10 seconds with supervision    Standing Unsupported with Feet Together  Able to place feet together independently but unable to hold for 30 seconds    From Standing, Reach Forward with Outstretched Arm  Can reach forward >5 cm safely (2")    From Standing Position, Pick up Object from Floor  Able to pick up shoe safely and easily    From Standing Position, Turn to Look Behind Over each Shoulder  Turn sideways only  but maintains balance    Turn 360 Degrees  Able to turn 360 degrees safely but slowly    Standing Unsupported, Alternately Place Feet on Step/Stool  Able to complete >2 steps/needs minimal assist    Standing Unsupported, One Foot in Front  Needs help to step but can hold 15 seconds    Standing on One Leg  Unable to try or needs assist to prevent fall    Total Score  37                    [facility] Adult PT Treatment/Exercise - 12/05/19 0001      Knee/Hip Exercises: Standing   Other Standing Knee Exercises  Punching toward yellow ball crossing midline to work on rotation for improved gait mechanics.       Knee/Hip Exercises: Seated   Sit to Sand  Other (comment);without UE support   STS 1 minute bouts x3 alternating with punches            PT Education - 12/05/19 1254    Education Details  Discussed re-assessment findings.    Person(s) Educated  Patient;Spouse;Caregiver(s)    Methods  Explanation    Comprehension  Verbalized understanding       PT Short Term Goals - 12/05/19 1114      PT SHORT TERM GOAL #1   Title  Patient and family will report understanding and regular compliance with HEP to improve balance, gait, strength, and overall functional mobility.    Time  3    Period  Weeks    Status  Achieved    Target Date  11/23/19        PT Long Term Goals - 12/05/19 1115      PT LONG TERM GOAL #1   Title  Patient will demonstrate improvement of at least 5 points on the BBS indicating improved balance and to have less risk of falls.    Baseline  12/05/19: 1 point of improvement    Time  6    Period  Weeks    Status  On-going      PT LONG TERM GOAL #2   Title  Patient's will demonstrate ability to perform bed mobility with mod I.    Time  6    Period  Weeks  Status  Achieved      PT LONG TERM GOAL #3   Title  Patient and family will be educated on options for options of safe showering and will report an improvement in ease of being able to shower by  at least 50%.    Time  6    Period  Weeks    Status  Achieved      PT LONG TERM GOAL #4   Title  Patient will demonstrate improvement of at least 50 feet while ambulating on the 2MWT indicating improved gait speed and safety to reduce falls.    Baseline  12/05/19: Ambulating an additional 25 feet on 2MWT    Time  6    Period  Weeks    Status  On-going            Plan - 12/05/19 1601    Clinical Impression Statement  Re-assessed patient this session. Patient achieved 1 out of 1 short term goals. Patient achieved 2 out of 4 long term goals. Patient has demonstrated improvements in distance ambulated on the 2MWT as well as some improvements in bed mobility. Patient demonstrated improvement in some areas on the BBS, but did demonstrate minimal decreases in other areas. Focused on HIIT this session alternating STS with punching for 1 minute bouts. Plan to continue therapy for an additional 2 visits to continue educating the patient's family on ways to practice balance at home and to work on improving gait velocity and sequencing.    Personal Factors and Comorbidities  Age;Comorbidity 2    Comorbidities  Dementia, HTN    Examination-Activity Limitations  Bathing;Stand;Bed Mobility;Locomotion Level;Toileting;Bend;Transfers;Carry;Squat;Stairs    Examination-Participation Restrictions  Yard Work;Cleaning;Meal Prep;Community Activity;Laundry;Shop    Stability/Clinical Decision Making  Evolving/Moderate complexity    Rehab Potential  Fair    PT Frequency  2x / week    PT Duration  6 weeks    PT Treatment/Interventions  ADLs/Self Care Home Management;Aquatic Therapy;Cryotherapy;Electrical Stimulation;Moist Heat;DME Instruction;Gait training;Stair training;Functional mobility training;Therapeutic activities;Therapeutic exercise;Balance training;Neuromuscular re-education;Patient/family education;Orthotic Fit/Training;Manual techniques;Passive range of motion;Energy conservation;Taping    PT Next  Visit Plan  Postural strengthening, balance training - turning, increasing gait velocity using cane    PT Home Exercise Plan  11/02/19: Seated marching slow and controlled x20 daily; 11/08/19: HS stretch, LTR; 11/14/19: Stepping over obstacles, walking to not hear steps.    Consulted and Agree with Plan of Care  Patient;Family member/caregiver       Patient will benefit from skilled therapeutic intervention in order to improve the following deficits and impairments:  Abnormal gait, Improper body mechanics, Decreased mobility, Postural dysfunction, Decreased activity tolerance, Decreased endurance, Decreased strength, Decreased balance, Difficulty walking, Impaired flexibility  Visit Diagnosis: Other abnormalities of gait and mobility  Unsteadiness on feet  Muscle weakness (generalized)     Problem List Patient Active Problem List   Diagnosis Date Noted  . PSP (progressive supranuclear palsy) ([place]) 11/15/2019  . Gait abnormality 11/15/2019  . Movement disorder 05/18/2019  . Dementia ([place]) 11/10/2018   Clarene Critchley PT, DPT 4:05 PM, 12/05/19 [facility] [address] [place], [place], [address] Phone: [phone]   Fax:  [phone]  Name: Luis Freeman MRN: [id number] Date of Birth: 18-Nov-1932

## 2019-12-07 ENCOUNTER — Encounter (HOSPITAL_COMMUNITY): Payer: Self-pay | Admitting: Physical Therapy

## 2019-12-07 ENCOUNTER — Ambulatory Visit (HOSPITAL_COMMUNITY): Payer: Medicare PPO | Admitting: Physical Therapy

## 2019-12-07 ENCOUNTER — Other Ambulatory Visit: Payer: Self-pay

## 2019-12-07 DIAGNOSIS — R2689 Other abnormalities of gait and mobility: Secondary | ICD-10-CM | POA: Diagnosis not present

## 2019-12-07 DIAGNOSIS — R41841 Cognitive communication deficit: Secondary | ICD-10-CM | POA: Diagnosis not present

## 2019-12-07 DIAGNOSIS — M6281 Muscle weakness (generalized): Secondary | ICD-10-CM

## 2019-12-07 DIAGNOSIS — R2681 Unsteadiness on feet: Secondary | ICD-10-CM | POA: Diagnosis not present

## 2019-12-07 NOTE — Therapy (Signed)
[facility] [address] [place], [place], [address] Phone: [phone]   Fax:  [phone]  Physical Therapy Treatment  Patient Details  Name: Luis Freeman MRN: [id number] Date of Birth: Mar 04, 1933 Referring Provider (PT): Dr. Regis Bill   Encounter Date: 12/07/2019  PT End of Session - 12/07/19 1534    Visit Number  11    Number of Visits  12    Date for PT Re-Evaluation  12/14/19    Authorization Type  [employer] (No visit limit, authorization required)    Progress Note Due on Visit  12    PT Start Time  1438   Patient arrived late   PT Stop Time  1516    PT Time Calculation (min)  38 min    Equipment Utilized During Treatment  Gait belt    Activity Tolerance  Patient tolerated treatment well    Behavior During Therapy  [facility] for tasks assessed/performed;Flat affect       Past Medical History:  Diagnosis Date  . Chronic low back pain   . Dementia ([place])   . Gait abnormality 11/15/2019  . Gait disorder   . Hypertension   . PSP (progressive supranuclear palsy) ([place]) 11/15/2019  . Vitamin B12 deficiency     Past Surgical History:  Procedure Laterality Date  . CATARACT EXTRACTION, BILATERAL    . COLONOSCOPY    . COLONOSCOPY  02/04/2011   Procedure: COLONOSCOPY;  Surgeon: Daneil Dolin, MD;  [facility];  Service: Endoscopy;  Laterality: N/A;    There were no vitals filed for this visit.  Subjective Assessment - 12/07/19 1441    Subjective  Patient denied any pain currently.    Patient is accompained by:  Family member   Wife and caregiver   Pertinent History  Progressive decrease in balance and ambulation speed    Limitations  Walking;Standing;House hold activities    Patient Stated Goals  Walking better, improving his balance and helping him get into the shower    Currently in Pain?  No/denies                        [facility] Adult PT Treatment/Exercise - 12/07/19 0001      Knee/Hip Exercises: Standing   Gait Training  Verbal cues to increase speed and encouragement to use cane 120 feet, then with RW x 200 feet with VCs for sequencing    Other Standing Knee Exercises  Foot taps on 4'' step 2x20 with UE assist. Rows 2x10 RTB max cueing for form    Other Standing Knee Exercises  Punching toward yellow ball crossing midline to work on rotation for improved gait mechanics. 1 minute x3 alternating with STS      Knee/Hip Exercises: Seated   Sit to Sand  Other (comment);without UE support   Alternating STS 1 min and then punches toward ball x3            PT Education - 12/07/19 1534    Education Details  Discussed obtaining a RW for community ambulation.    Person(s) Educated  Patient;Spouse;Caregiver(s)    Methods  Explanation    Comprehension  Verbalized understanding       PT Short Term Goals - 12/05/19 1114      PT SHORT TERM GOAL #1   Title  Patient and family will report understanding and regular compliance with HEP to improve balance, gait, strength, and overall functional mobility.  Time  3    Period  Weeks    Status  Achieved    Target Date  11/23/19        PT Long Term Goals - 12/05/19 1115      PT LONG TERM GOAL #1   Title  Patient will demonstrate improvement of at least 5 points on the BBS indicating improved balance and to have less risk of falls.    Baseline  12/05/19: 1 point of improvement    Time  6    Period  Weeks    Status  On-going      PT LONG TERM GOAL #2   Title  Patient's will demonstrate ability to perform bed mobility with mod I.    Time  6    Period  Weeks    Status  Achieved      PT LONG TERM GOAL #3   Title  Patient and family will be educated on options for options of safe showering and will report an improvement in ease of being able to shower by at least 50%.    Time  6    Period  Weeks    Status  Achieved      PT LONG TERM GOAL #4   Title  Patient will demonstrate improvement of at least 50 feet  while ambulating on the 2MWT indicating improved gait speed and safety to reduce falls.    Baseline  12/05/19: Ambulating an additional 25 feet on 2MWT    Time  6    Period  Weeks    Status  On-going            Plan - 12/07/19 1535    Clinical Impression Statement  Worked on gait training this session with quad cane initially. Patient continued to demonstrate poor sequencing and use of quad cane with ambulation. Educated family/caregiver regarding this and then had the patient trial ambulation with a RW with noted improvement in safe use of AD. Discussed obtaining a RW for safe ambulation of long distances, but continuing to use a cane for household distances. Family plans to obtain a RW now. Trialed foot taps using a 4'' step this session. Patient required intermittent assistance of UE to perform toe taps on step this session. Plan to discharge patient at next session pending patient presentation.    Personal Factors and Comorbidities  Age;Comorbidity 2    Comorbidities  Dementia, HTN    Examination-Activity Limitations  Bathing;Stand;Bed Mobility;Locomotion Level;Toileting;Bend;Transfers;Carry;Squat;Stairs    Examination-Participation Restrictions  Yard Work;Cleaning;Meal Prep;Community Activity;Laundry;Shop    Stability/Clinical Decision Making  Evolving/Moderate complexity    Rehab Potential  Fair    PT Frequency  2x / week    PT Duration  6 weeks    PT Treatment/Interventions  ADLs/Self Care Home Management;Aquatic Therapy;Cryotherapy;Electrical Stimulation;Moist Heat;DME Instruction;Gait training;Stair training;Functional mobility training;Therapeutic activities;Therapeutic exercise;Balance training;Neuromuscular re-education;Patient/family education;Orthotic Fit/Training;Manual techniques;Passive range of motion;Energy conservation;Taping    PT Next Visit Plan  balance training - turning, F/U on obtaining a RW. Plan to DC pending patient presentation.    PT Home Exercise Plan  11/02/19:  Seated marching slow and controlled x20 daily; 11/08/19: HS stretch, LTR; 11/14/19: Stepping over obstacles, walking to not hear steps.    Consulted and Agree with Plan of Care  Patient;Family member/caregiver       Patient will benefit from skilled therapeutic intervention in order to improve the following deficits and impairments:  Abnormal gait, Improper body mechanics, Decreased mobility, Postural dysfunction, Decreased activity tolerance, Decreased endurance,  Decreased strength, Decreased balance, Difficulty walking, Impaired flexibility  Visit Diagnosis: Other abnormalities of gait and mobility  Unsteadiness on feet  Muscle weakness (generalized)     Problem List Patient Active Problem List   Diagnosis Date Noted  . PSP (progressive supranuclear palsy) ([place]) 11/15/2019  . Gait abnormality 11/15/2019  . Movement disorder 05/18/2019  . Dementia ([place]) 11/10/2018   Clarene Critchley PT, DPT 3:37 PM, 12/07/19 [facility] [address] [place], [place], [address] Phone: [phone]   Fax:  [phone]  Name: Luis Freeman MRN: [id number] Date of Birth: May 02, 1933

## 2019-12-08 ENCOUNTER — Emergency Department (HOSPITAL_COMMUNITY)
Admission: EM | Admit: 2019-12-08 | Discharge: 2019-12-08 | Disposition: A | Discharge status: Home / Self Care | Payer: Medicare PPO | Attending: Emergency Medicine | Admitting: Emergency Medicine

## 2019-12-08 ENCOUNTER — Other Ambulatory Visit: Payer: Self-pay

## 2019-12-08 ENCOUNTER — Emergency Department (HOSPITAL_COMMUNITY): Payer: Medicare PPO

## 2019-12-08 ENCOUNTER — Encounter (HOSPITAL_COMMUNITY): Payer: Self-pay | Admitting: Emergency Medicine

## 2019-12-08 DIAGNOSIS — R42 Dizziness and giddiness: Secondary | ICD-10-CM | POA: Diagnosis not present

## 2019-12-08 DIAGNOSIS — S65311A Laceration of deep palmar arch of right hand, initial encounter: Secondary | ICD-10-CM | POA: Diagnosis not present

## 2019-12-08 DIAGNOSIS — S0083XA Contusion of other part of head, initial encounter: Secondary | ICD-10-CM

## 2019-12-08 DIAGNOSIS — Y9289 Other specified places as the place of occurrence of the external cause: Secondary | ICD-10-CM | POA: Insufficient documentation

## 2019-12-08 DIAGNOSIS — Z79899 Other long term (current) drug therapy: Secondary | ICD-10-CM | POA: Insufficient documentation

## 2019-12-08 DIAGNOSIS — F0390 Unspecified dementia without behavioral disturbance: Secondary | ICD-10-CM | POA: Diagnosis not present

## 2019-12-08 DIAGNOSIS — G231 Progressive supranuclear ophthalmoplegia [Steele-Richardson-Olszewski]: Secondary | ICD-10-CM | POA: Insufficient documentation

## 2019-12-08 DIAGNOSIS — R000 Tachycardia, unspecified: Secondary | ICD-10-CM | POA: Diagnosis not present

## 2019-12-08 DIAGNOSIS — S01111A Laceration without foreign body of right eyelid and periocular area, initial encounter: Secondary | ICD-10-CM

## 2019-12-08 DIAGNOSIS — W19XXXA Unspecified fall, initial encounter: Secondary | ICD-10-CM | POA: Diagnosis not present

## 2019-12-08 DIAGNOSIS — I10 Essential (primary) hypertension: Secondary | ICD-10-CM | POA: Diagnosis not present

## 2019-12-08 DIAGNOSIS — S065XAA Traumatic subdural hemorrhage with loss of consciousness status unknown, initial encounter: Secondary | ICD-10-CM

## 2019-12-08 DIAGNOSIS — S065X0A Traumatic subdural hemorrhage without loss of consciousness, initial encounter: Secondary | ICD-10-CM | POA: Insufficient documentation

## 2019-12-08 DIAGNOSIS — Y999 Unspecified external cause status: Secondary | ICD-10-CM | POA: Insufficient documentation

## 2019-12-08 DIAGNOSIS — Y9389 Activity, other specified: Secondary | ICD-10-CM | POA: Diagnosis not present

## 2019-12-08 DIAGNOSIS — R0689 Other abnormalities of breathing: Secondary | ICD-10-CM | POA: Diagnosis not present

## 2019-12-08 DIAGNOSIS — S0181XA Laceration without foreign body of other part of head, initial encounter: Secondary | ICD-10-CM

## 2019-12-08 DIAGNOSIS — S065X9A Traumatic subdural hemorrhage with loss of consciousness of unspecified duration, initial encounter: Secondary | ICD-10-CM

## 2019-12-08 DIAGNOSIS — S61411A Laceration without foreign body of right hand, initial encounter: Secondary | ICD-10-CM | POA: Diagnosis not present

## 2019-12-08 DIAGNOSIS — S199XXA Unspecified injury of neck, initial encounter: Secondary | ICD-10-CM | POA: Diagnosis not present

## 2019-12-08 DIAGNOSIS — I4510 Unspecified right bundle-branch block: Secondary | ICD-10-CM | POA: Diagnosis not present

## 2019-12-08 DIAGNOSIS — R58 Hemorrhage, not elsewhere classified: Secondary | ICD-10-CM | POA: Diagnosis not present

## 2019-12-08 LAB — CBG MONITORING, ED: Glucose-Capillary: 113 mg/dL — ABNORMAL HIGH (ref 70–99)

## 2019-12-08 MED ORDER — LIDOCAINE HCL (PF) 2 % IJ SOLN
10.0000 mL | Freq: Once | INTRAMUSCULAR | Status: AC
  Administered 2019-12-08: 10 mL
  Filled 2019-12-08: qty 10

## 2019-12-08 NOTE — ED Triage Notes (Signed)
Pt to ED via [facility] after being found in his garage on the floor by family after falling.  Pt has lg laceration to right forehead.  Bleeding controlled at this time.  Pt is not on any blood thinners

## 2019-12-08 NOTE — ED Notes (Signed)
Family at bedside. 

## 2019-12-08 NOTE — ED Notes (Signed)
CBG Results of 113 reported to Dr.Wentz.

## 2019-12-08 NOTE — Progress Notes (Signed)
Patient ID: Luis Freeman, male   DOB: 1932/10/21, 86 y.o.   MRN: [id number]  I have reviewed the CT scan. The scan shows a small parafalcine hematoma on the left. There is no mass effect, very little chance that this would progress to an operative lesion. [place] for discharge.

## 2019-12-08 NOTE — Discharge Instructions (Addendum)
For the wounds on the face and right hand, clean them well with soap and water daily and apply bandage, until they are healed.  Regarding the head injury and small subdural seen, make sure that he continues to act his normal self without worrisome symptoms.  These are listed on the attached instructions for head injury.  Some notable things to watch for include worsening pain, nausea vomiting, blurred vision, inability to walk.  Return here if you become concerned that his symptoms are indicative of a worsening head injury.  Have the sutures removed in 7 to 8 days.

## 2019-12-08 NOTE — ED Provider Notes (Addendum)
[facility] EMERGENCY DEPARTMENT Provider Note   CSN: [id number] Arrival date & time: 12/08/19  1809     History Chief Complaint  Patient presents with  . Fall    Luis Freeman is a 86 y.o. male.  HPI Patient here for evaluation of fall.  He was found by family member, lying on the floor of his garage.  Patient has cognitive deficits and is unable to specify exactly what happened.  Directed by EMS, for evaluation of injuries.  His daughter arrived shortly after and states that the patient is in rehab for neurocognitive dysfunction, and was started on Sinemet, 3 weeks ago, with plans to increase dose today.  This is apparently a trial to see if he can help his neurologic decline.  Daughter does not know of any recent illnesses that the patient has had besides what has been stated above.  Level 5 caveat-confusion    Past Medical History:  Diagnosis Date  . Chronic low back pain   . Dementia ([place])   . Gait abnormality 11/15/2019  . Gait disorder   . Hypertension   . PSP (progressive supranuclear palsy) ([place]) 11/15/2019  . Vitamin B12 deficiency     Patient Active Problem List   Diagnosis Date Noted  . PSP (progressive supranuclear palsy) ([place]) 11/15/2019  . Gait abnormality 11/15/2019  . Movement disorder 05/18/2019  . Dementia ([place]) 11/10/2018    Past Surgical History:  Procedure Laterality Date  . CATARACT EXTRACTION, BILATERAL    . COLONOSCOPY    . COLONOSCOPY  02/04/2011   Procedure: COLONOSCOPY;  Surgeon: Daneil Dolin, MD;  Location: [facility];  Service: Endoscopy;  Laterality: N/A;       Family History  Problem Relation Age of Onset  . Diabetes Mother   . Cancer Father   . Cancer - Lung Brother     Social History   Tobacco Use  . Smoking status: Never Smoker  . Smokeless tobacco: Never Used  Substance Use Topics  . Alcohol use: Not Currently    Alcohol/week: 0.0 standard drinks  . Drug use: No    Home Medications Prior to  Admission medications   Medication Sig Start Date End Date Taking? Authorizing Provider  amLODipine (NORVASC) 5 MG tablet Take 5 mg by mouth daily.      [provider]  aspirin 81 MG tablet Take 81 mg by mouth daily.      [provider]  benazepril (LOTENSIN) 40 MG tablet Take 40 mg by mouth daily.      [provider]  carbidopa-levodopa (SINEMET IR) 25-100 MG tablet 1/2 tablet three times a day for 3 weeks, then take one tablet three times a day 11/15/19   Kathrynn Ducking, MD  chlorthalidone (HYGROTON) 25 MG tablet Take 25 mg by mouth every other day.    [provider]  cholecalciferol (VITAMIN D) 1000 units tablet Take 1,000 Units by mouth daily.    [provider]  cyanocobalamin (,VITAMIN B-12,) 1000 MCG/ML injection Inject 1,000 mcg into the muscle every 30 (thirty) days.  08/02/15   [provider]  cyanocobalamin (,VITAMIN B-12,) 1000 MCG/ML injection Inject into the muscle. 08/02/15   [provider]  hydrALAZINE (APRESOLINE) 25 MG tablet Take by mouth 3 (three) times daily.  07/03/19   [provider]  memantine (NAMENDA) 10 MG tablet Take by mouth. 08/08/19   [provider]  mupirocin cream (BACTROBAN) 2 % Apply 1 application topically 2 (  two) times daily. 11/13/19   Wurst, [place], PA-C  Polyvinyl Alcohol-Povidone (REFRESH OP) Apply to eye.    [provider]  UNABLE TO FIND Med Name: Compound cream Diclofenac 3&  Lidocaine 5% Menthol %    [provider]    Allergies    Patient has no known allergies.  Review of Systems   Review of Systems  Unable to perform ROS: Mental status change    Physical Exam Updated Vital Signs BP 139/87 (BP Location: Right Arm)   Pulse 88   Temp 99.2 F (37.3 C) (Oral)   Resp 20   Ht 5\' 11"  (1.803 m)   Wt 70.3 kg   SpO2 100%   BMI 21.62 kg/m   Physical Exam Vitals and nursing note reviewed.  Constitutional:      General: He is not in  acute distress.    Appearance: He is well-developed. He is not ill-appearing, toxic-appearing or diaphoretic.  HENT:     Head: Normocephalic.     Comments: Large laceration, right forehead, no associated crepitation or deformity.  Right cheek slightly swollen with ecchymosis.    Right Ear: External ear normal.     Left Ear: External ear normal.     Nose: Nose normal.     Mouth/Throat:     Mouth: Mucous membranes are moist.  Eyes:     Extraocular Movements: Extraocular movements intact.     Conjunctiva/sclera: Conjunctivae normal.     Pupils: Pupils are equal, round, and reactive to light.  Neck:     Trachea: Phonation normal.  Cardiovascular:     Rate and Rhythm: Normal rate.  Pulmonary:     Effort: Pulmonary effort is normal.  Chest:     Chest wall: No tenderness.  Abdominal:     General: There is no distension.     Palpations: Abdomen is soft.     Tenderness: There is no abdominal tenderness.  Musculoskeletal:        General: Normal range of motion.     Cervical back: Normal range of motion and neck supple.     Comments: He can move arms and legs, in a fairly normal manner.  No gross deformity of the large joints.  Skin:    General: Skin is warm and dry.  Neurological:     Mental Status: He is alert.     Cranial Nerves: No cranial nerve deficit.     Motor: No abnormal muscle tone.     Coordination: Coordination normal.     Comments: No dysarthria.  No apparent aphasia.  He is confused.  He is conversant.  Psychiatric:        Mood and Affect: Mood normal.        Behavior: Behavior normal.     ED Results / Procedures / Treatments   Labs (all labs ordered are listed, but only abnormal results are displayed) Labs Reviewed  CBG MONITORING, ED - Abnormal; Notable for the following components:      Result Value   Glucose-Capillary 113 (*)    All other components within normal limits    EKG EKG Interpretation  Date/Time:  Friday Dec 08 2019 18:28:55 EDT Ventricular  Rate:  98 PR Interval:    QRS Duration: 138 QT Interval:  394 QTC Calculation: 504 R Axis:   83 Text Interpretation: Sinus tachycardia Atrial premature complex Right bundle branch block ST elevation, consider inferior injury Since last tracing rate faster Otherwise no significant change Confirmed by Daleen Bo [phone])  on 12/08/2019 8:01:31 PM   Radiology CT Head Wo Contrast  Result Date: 12/08/2019 CLINICAL DATA:  86 year old male found down in garage. EXAM: CT HEAD WITHOUT CONTRAST TECHNIQUE: Contiguous axial images were obtained from the base of the skull through the vertex without intravenous contrast. COMPARISON:  Brain MRI 11/02/2017. Head CT 04/04/2018. FINDINGS: Brain: Small volume para falcine subdural hematoma on the left (series 3, image 24). Underlying chronic calcification of the falx. No subarachnoid or intraventricular hemorrhage identified. No peripheral subdural blood identified. No cerebral contusion identified. No midline shift, ventriculomegaly, mass effect, evidence of mass lesion, or evidence of cortically based acute infarction. Stable gray-white matter differentiation throughout the brain. Vascular: Calcified atherosclerosis at the skull base. Skull: No fracture identified. Sinuses/Orbits: Visualized paranasal sinuses and mastoids are stable and well pneumatized. Other: Right forehead scalp soft tissue injury with overlying dressing material. 5 mm of underlying scalp hematoma at that site. But the underlying right frontal bone appears to remain intact. Nearby orbits soft tissues appears stable. Other scalp soft tissues are stable. IMPRESSION: 1. Trace left para falcinesubdural hematoma. But no intracranial mass effect or other acute traumatic injury to the brain identified. 2. Right forehead scalp soft tissue injury without underlying skull fracture. Electronically Signed   By: Genevie Ann M.D.   On: 12/08/2019 19:59   CT Cervical Spine Wo Contrast  Result Date:  12/08/2019 CLINICAL DATA:  86 year old male found down in garage. EXAM: CT CERVICAL SPINE WITHOUT CONTRAST TECHNIQUE: Multidetector CT imaging of the cervical spine was performed without intravenous contrast. Multiplanar CT image reconstructions were also generated. COMPARISON:  Head and face CT today reported separately. FINDINGS: Alignment: Preserved cervical lordosis. Cervicothoracic junction alignment is within normal limits. Bilateral posterior element alignment is within normal limits. Skull base and vertebrae: Visualized skull base is intact. No atlanto-occipital dissociation. C1 and C2 appear intact and normally aligned. No acute osseous abnormality identified. Soft tissues and spinal canal: No prevertebral fluid or swelling. No visible canal hematoma. Negative noncontrast neck soft tissues aside from calcified carotid atherosclerosis. Disc levels: Mild for age cervical spine degeneration. Up to mild degenerative spinal stenosis. Upper chest: Visible upper thoracic levels appear intact. There is partially calcified confluent biapical lung scarring. IMPRESSION: 1. No acute traumatic injury identified in the cervical spine. 2. Mild for age cervical spine degeneration. Electronically Signed   By: Genevie Ann M.D.   On: 12/08/2019 20:07   CT Maxillofacial WO CM  Result Date: 12/08/2019 CLINICAL DATA:  86 year old male found down in garage. EXAM: CT MAXILLOFACIAL WITHOUT CONTRAST TECHNIQUE: Multidetector CT imaging of the maxillofacial structures was performed. Multiplanar CT image reconstructions were also generated. COMPARISON:  Head CT today reported separately. Brain MRI 11/02/2017. FINDINGS: Osseous: Mandible intact and normally located. Maxilla, zygoma, nasal bones and central skull base appear intact. Osteopenia. Cervical spine details reported separately. Orbits: Orbital walls appear intact. Chronic postoperative changes to the globes. Orbits soft tissues remain within normal limits. There is supraorbital  soft tissue injury on the right consisting of laceration and small hematoma. Sinuses: Clear throughout. Soft tissues: Negative visible noncontrast larynx, pharynx, parapharyngeal spaces, retropharyngeal space, sublingual space, submandibular spaces, masticator spaces and parotid spaces. Calcified cervical carotid atherosclerosis greater on the left. No upper cervical lymphadenopathy. Limited intracranial: Trace para falcine subdural hematoma reported separately is not evident on these images. No new intracranial abnormality is identified. IMPRESSION: 1. Right supraorbital soft tissue injury. No facial fracture identified. 2. See also Head and Cervical Spine CT details reported separately. Electronically Signed  By: Genevie Ann M.D.   On: 12/08/2019 20:03    Procedures .Critical Care Performed by: Daleen Bo, MD Authorized by: Daleen Bo, MD   Critical care provider statement:    Critical care time (minutes):  35   Critical care start time:  12/08/2019 6:10 PM   Critical care end time:  12/08/2019 9:36 PM   Critical care time was exclusive of:  Separately billable procedures and treating other patients   Critical care was necessary to treat or prevent imminent or life-threatening deterioration of the following conditions:  CNS failure or compromise   Critical care was time spent personally by me on the following activities:  Blood draw for specimens, development of treatment plan with patient or surrogate, discussions with consultants, evaluation of patient's response to treatment, examination of patient, obtaining history from patient or surrogate, ordering and performing treatments and interventions, ordering and review of laboratory studies, pulse oximetry, re-evaluation of patient's condition, review of old charts and ordering and review of radiographic studies .Marland KitchenLaceration Repair  Date/Time: 12/08/2019 9:36 PM Performed by: Daleen Bo, MD Authorized by: Daleen Bo, MD   Consent:     Consent obtained:  Verbal   Consent given by:  Healthcare agent   Risks discussed:  Pain and need for additional repair   Alternatives discussed:  No treatment Anesthesia (see MAR for exact dosages):    Anesthesia method:  Local infiltration   Local anesthetic:  Lidocaine 1% w/o epi Laceration details:    Location:  Face   Length (cm):  5.5   Depth (mm):  12 Repair type:    Repair type:  Intermediate Pre-procedure details:    Preparation:  Patient was prepped and draped in usual sterile fashion and imaging obtained to evaluate for foreign bodies Exploration:    Hemostasis achieved with:  Direct pressure   Wound exploration: wound explored through full range of motion     Wound extent: no areolar tissue violation noted, no fascia violation noted, no foreign bodies/material noted, no muscle damage noted and no nerve damage noted     Contaminated: no   Treatment:    Area cleansed with:  Betadine   Amount of cleaning:  Extensive   Irrigation solution:  Sterile water   Irrigation volume:  20 cc   Irrigation method:  Syringe   Visualized foreign bodies/material removed: no   Subcutaneous repair:    Suture size:  4-0   Suture material:  Vicryl   Suture technique:  Vertical mattress   Number of sutures:  5 Skin repair:    Repair method:  Sutures   Suture size:  4-0   Suture material:  Prolene   Suture technique:  Simple interrupted   Number of sutures:  8 Approximation:    Approximation:  Loose Post-procedure details:    Dressing:  Non-adherent dressing   Patient tolerance of procedure:  Tolerated well, no immediate complications .Marland KitchenLaceration Repair  Date/Time: 12/08/2019 9:40 PM Performed by: Daleen Bo, MD Authorized by: Daleen Bo, MD   Consent:    Consent obtained:  Verbal   Consent given by:  Patient and healthcare agent   Risks discussed:  Infection and pain   Alternatives discussed:  No treatment Anesthesia (see MAR for exact dosages):    Anesthesia  method:  Local infiltration   Local anesthetic:  Lidocaine 1% w/o epi Laceration details:    Location:  Hand   Hand location:  R palm   Length (cm):  2 Repair type:  Repair type:  Simple Pre-procedure details:    Preparation:  Patient was prepped and draped in usual sterile fashion Exploration:    Hemostasis achieved with:  Direct pressure   Wound exploration: wound explored through full range of motion and entire depth of wound probed and visualized     Wound extent: no areolar tissue violation noted, no fascia violation noted, no foreign bodies/material noted, no muscle damage noted and no underlying fracture noted     Contaminated: no   Treatment:    Area cleansed with:  Betadine   Amount of cleaning:  Standard   Irrigation solution:  Sterile water   Irrigation volume:  5 cc   Irrigation method:  Syringe   Visualized foreign bodies/material removed: no   Skin repair:    Repair method:  Sutures   Suture size:  4-0   Suture material:  Prolene   Suture technique:  Simple interrupted   Number of sutures:  2 Approximation:    Approximation:  Loose Post-procedure details:    Dressing:  Non-adherent dressing   Patient tolerance of procedure:  Tolerated well, no immediate complications   (including critical care time)  Medications Ordered in ED Medications  lidocaine HCl (PF) (XYLOCAINE) 2 % injection 10 mL (10 mLs Other Given 12/08/19 2036)    ED Course  I have reviewed the triage vital signs and the nursing notes.  Pertinent labs & imaging results that were available during my care of the patient were reviewed by me and considered in my medical decision making (see chart for details).  Clinical Course as of Dec 07 2198  Fri Dec 08, 2019  2020 Radiologist reviewed and interpreted the CT studies, which are reassuring and revealed trace parafalcine subdural.   [EW]  2141 Case discussed with neurosurgery, Dr. Christella Noa.  He reviewed the CT images.  He states that this  patient does not require admission or intervention for the trace parafalcine bleeding subdural seen on the imaging.   [EW]    Clinical Course User Index [EW] Daleen Bo, MD   MDM Rules/Calculators/A&P                       Patient Vitals for the past 24 hrs:  BP Temp Temp src Pulse Resp SpO2 Height Weight  12/08/19 1955 139/87 -- -- 88 -- 100 % -- --  12/08/19 1954 -- -- -- -- -- -- 5\' 11"  (1.803 m) --  12/08/19 1953 -- -- -- -- -- -- -- 70.3 kg  12/08/19 1900 140/89 -- -- 74 20 100 % -- --  12/08/19 1830 (!) 151/98 -- -- 75 15 99 % -- --  12/08/19 1814 (!) 154/77 99.2 F (37.3 C) Oral 87 16 100 % -- --    9:56 PM Reevaluation with update and discussion. After initial assessment and treatment, an updated evaluation reveals patient tolerated suturing well.  Findings discussed with patient and his daughter who was in the room.  We discussed the CT findings and Dr. Hewitt Shorts recommendation for discharge.  All questions were answered. Daleen Bo   Medical Decision Making:  This patient is presenting for evaluation of injuries from fall which is likely mechanical in etiology, which does require a range of treatment options, and is a complaint that involves a high risk of morbidity and mortality. The differential diagnoses include contusion, intracranial injury, cervical spine injury, facial fracture. I decided to review old records, and in summary patient with neurologic decline, currently unspecified but  actively being evaluated and treated.  I obtained additional historical information from his daughter, in the room..   Radiologic Tests Ordered, included CT head, face, cervical spine.  I independently Visualized: Images images, which show trace parafalcine subdural hematoma.  No skull fracture.  Mild degenerative change cervical spine.  No facial fracture.  Cardiac Monitor Tracing which shows normal sinus rhythm   Critical Interventions-clinical evaluation, CT imaging,  laceration repair, observation, reassessment.  Case discussed with neurosurgery team.  After These Interventions, the Patient was reevaluated and was found stable, able to be discharged.  Small subdural, nonoperative without expected risk of progression.  Patient's mental status is stable, he does not require hospitalization or further work-up at this time.  CRITICAL CARE-yes Performed by: Daleen Bo  Nursing Notes Reviewed/ Care Coordinated Applicable Imaging Reviewed Interpretation of Laboratory Data incorporated into ED treatment  The patient appears reasonably screened and/or stabilized for discharge and I doubt any other medical condition or other [facility] requiring further screening, evaluation, or treatment in the ED at this time prior to discharge.  Plan: Home Medications-Tylenol for pain, routine medications; Home Treatments-wound care at home; return here if the recommended treatment, does not improve the symptoms; Recommended follow up-suture removal 7 to 8 days, return as needed, PCP, as needed.     Final Clinical Impression(s) / ED Diagnoses Final diagnoses:  Subdural hematoma (HCC)  Contusion of face, initial encounter  Laceration of eyebrow and forehead, right, initial encounter  Laceration of deep palmar arch of right hand, initial encounter    Rx / DC Orders ED Discharge Orders    None       Daleen Bo, MD 12/08/19 2200    Daleen Bo, MD 12/17/19 1131

## 2019-12-12 ENCOUNTER — Ambulatory Visit (HOSPITAL_COMMUNITY): Payer: Medicare PPO | Admitting: Physical Therapy

## 2019-12-14 ENCOUNTER — Other Ambulatory Visit: Payer: Self-pay

## 2019-12-14 ENCOUNTER — Ambulatory Visit (HOSPITAL_COMMUNITY): Payer: Medicare PPO | Admitting: Physical Therapy

## 2019-12-14 DIAGNOSIS — M6281 Muscle weakness (generalized): Secondary | ICD-10-CM

## 2019-12-14 DIAGNOSIS — R2689 Other abnormalities of gait and mobility: Secondary | ICD-10-CM

## 2019-12-14 DIAGNOSIS — R41841 Cognitive communication deficit: Secondary | ICD-10-CM | POA: Diagnosis not present

## 2019-12-14 DIAGNOSIS — R2681 Unsteadiness on feet: Secondary | ICD-10-CM

## 2019-12-15 ENCOUNTER — Encounter (HOSPITAL_COMMUNITY): Payer: Self-pay | Admitting: Physical Therapy

## 2019-12-15 NOTE — Therapy (Signed)
[facility] [address] [place], [place], [address] Phone: [phone]   Fax:  [phone]  Physical Therapy Treatment / Discharge Summary  Patient Details  Name: Luis Freeman MRN: [id number] Date of Birth: 1933-07-13 Referring Provider (PT): Dr. Regis Bill   Encounter Date: 12/14/2019   PHYSICAL THERAPY DISCHARGE SUMMARY  Visits from Start of Care: 12  Current functional level related to goals / functional outcomes: See below   Remaining deficits: See below   Education / Equipment: HEP, use of RW Plan: Patient agrees to discharge.  Patient goals were not met. Patient is being discharged due to meeting the stated rehab goals.  ?????       PT End of Session - 12/14/19 0730    Visit Number  12    Number of Visits  12    Date for PT Re-Evaluation  12/14/19    Authorization Type  [employer] (No visit limit, authorization required)    Progress Note Due on Visit  12    PT Start Time  [phone]   Patient arrived late   PT Stop Time  0945    PT Time Calculation (min)  35 min    Equipment Utilized During Treatment  Gait belt    Activity Tolerance  Patient tolerated treatment well    Behavior During Therapy  WFL for tasks assessed/performed;Flat affect       Past Medical History:  Diagnosis Date  . Chronic low back pain   . Dementia ([place])   . Gait abnormality 11/15/2019  . Gait disorder   . Hypertension   . PSP (progressive supranuclear palsy) ([place]) 11/15/2019  . Vitamin B12 deficiency     Past Surgical History:  Procedure Laterality Date  . CATARACT EXTRACTION, BILATERAL    . COLONOSCOPY    . COLONOSCOPY  02/04/2011   Procedure: COLONOSCOPY;  Surgeon: Daneil Dolin, MD;  [facility];  Service: Endoscopy;  Laterality: N/A;    There were no vitals filed for this visit.  Subjective Assessment - 12/14/19 0724    Subjective  Patient denied any pain. Patient's family reporting that they feel that the  patient has improved in therapy even following the patient's most recent fall. Paitent with SDH following recent fall. Family reports that they feel they can continue with activities and exercises at home at this time with the help of [name], the caregiver that comes several times a week.    Patient is accompained by:  Family member   Wife and daughter   Pertinent History  Progressive decrease in balance and ambulation speed    Limitations  Walking;Standing;House hold activities    Patient Stated Goals  Walking better, improving his balance and helping him get into the shower    Currently in Pain?  No/denies         [facility] PT Assessment - 12/14/19 0001      Assessment   Medical Diagnosis  Progressive Supranuclear Palsy    Referring Provider (PT)  Dr. Regis Bill      Precautions   Precautions  Fall      Prior Function   Level of Independence  Requires assistive device for independence      Cognition   Overall Cognitive Status  Impaired/Different from baseline      Transfers   Comments  Patient performing sit to stands without UE without cueing      Ambulation/Gait   Ambulation/Gait  Yes    Ambulation  Distance (Feet)  200 Feet   2MWT   Assistive device  Rolling walker    Gait Comments  Good use of RW with minimal cueing      Berg Balance Test   Sit to Stand  Able to stand without using hands and stabilize independently    Standing Unsupported  Able to stand safely 2 minutes    Sitting with Back Unsupported but Feet Supported on Floor or Stool  Able to sit safely and securely 2 minutes    Stand to Sit  Sits safely with minimal use of hands    Transfers  Able to transfer safely, minor use of hands    Standing Unsupported with Eyes Closed  Able to stand 10 seconds with supervision    Standing Unsupported with Feet Together  Able to place feet together independently and stand for 1 minute with supervision    From Standing, Reach Forward with Outstretched Arm  Can reach forward  >12 cm safely (5")    From Standing Position, Pick up Object from [place] to pick up shoe safely and easily    From Standing Position, Turn to Look Behind Over each Shoulder  Turn sideways only but maintains balance    Turn 360 Degrees  Able to turn 360 degrees safely but slowly    Standing Unsupported, Alternately Place Feet on Step/Stool  Able to complete >2 steps/needs minimal assist    Standing Unsupported, One Foot in Front  Needs help to step but can hold 15 seconds    Standing on One Leg  Unable to try or needs assist to prevent fall    Total Score  39    Berg comment:  Was 36 then 16                            PT Education - 12/14/19 0726    Education Details  Educated on examination findings, improved ambulation with a RW and continuing practice at home.    Person(s) Educated  Patient;Spouse;Caregiver(s)    Methods  Explanation;Handout    Comprehension  Verbalized understanding       PT Short Term Goals - 12/05/19 1114      PT SHORT TERM GOAL #1   Title  Patient and family will report understanding and regular compliance with HEP to improve balance, gait, strength, and overall functional mobility.    Time  3    Period  Weeks    Status  Achieved    Target Date  11/23/19        PT Long Term Goals - 12/14/19 0731      PT LONG TERM GOAL #1   Title  Patient will demonstrate improvement of at least 5 points on the BBS indicating improved balance and to have less risk of falls.    Baseline  12/15/19: 3 points of improvement    Time  6    Period  Weeks    Status  On-going      PT LONG TERM GOAL #2   Title  Patient's will demonstrate ability to perform bed mobility with mod I.    Time  6    Period  Weeks    Status  Achieved      PT LONG TERM GOAL #3   Title  Patient and family will be educated on options for options of safe showering and will report an improvement in ease of being able to shower  by at least 50%.    Time  6    Period  Weeks     Status  Achieved      PT LONG TERM GOAL #4   Title  Patient will demonstrate improvement of at least 50 feet while ambulating on the 2MWT indicating improved gait speed and safety to reduce falls.    Baseline  12/15/19: Patient ambulating 200 feet on 2MWT    Time  6    Period  Weeks    Status  On-going            Plan - 12/14/19 0731    Clinical Impression Statement  Performed re-assessment of patient's progress towards goals. Patient demonstrates similar speed on 2MWT this date with RW, but demonstrates improved use of AD for safety. Patient demonstrates some improvements on BBS this date, however, overall feel that family and caregiver have sufficient education and ability to continue HEP at home currently. Educated family on continuing activities and exercises at home and discussed that patient may benefit from therapy in the future if he has a decline in functioning. Educated family on patient using a RW at this time as he demonstrates improved safe use of this compared to using a cane. Educated on sizing RW and provided a printout of this information. Plan to discharge the patient at this time to HEP, patient and family are agreeable to this plan.    Personal Factors and Comorbidities  Age;Comorbidity 2    Comorbidities  Dementia, HTN    Examination-Activity Limitations  Bathing;Stand;Bed Mobility;Locomotion Level;Toileting;Bend;Transfers;Carry;Squat;Stairs    Examination-Participation Restrictions  Yard Work;Cleaning;Meal Prep;Community Activity;Laundry;Shop    Stability/Clinical Decision Making  Evolving/Moderate complexity    Rehab Potential  Fair    PT Frequency  2x / week    PT Duration  6 weeks    PT Treatment/Interventions  ADLs/Self Care Home Management;Aquatic Therapy;Cryotherapy;Electrical Stimulation;Moist Heat;DME Instruction;Gait training;Stair training;Functional mobility training;Therapeutic activities;Therapeutic exercise;Balance training;Neuromuscular  re-education;Patient/family education;Orthotic Fit/Training;Manual techniques;Passive range of motion;Energy conservation;Taping    PT Next Visit Plan  DC    PT Home Exercise Plan  11/02/19: Seated marching slow and controlled x20 daily; 11/08/19: HS stretch, LTR; 11/14/19: Stepping over obstacles, walking to not hear steps.    Consulted and Agree with Plan of Care  Patient;Family member/caregiver       Patient will benefit from skilled therapeutic intervention in order to improve the following deficits and impairments:  Abnormal gait, Improper body mechanics, Decreased mobility, Postural dysfunction, Decreased activity tolerance, Decreased endurance, Decreased strength, Decreased balance, Difficulty walking, Impaired flexibility  Visit Diagnosis: Other abnormalities of gait and mobility  Unsteadiness on feet  Muscle weakness (generalized)     Problem List Patient Active Problem List   Diagnosis Date Noted  . PSP (progressive supranuclear palsy) ([place]) 11/15/2019  . Gait abnormality 11/15/2019  . Movement disorder 05/18/2019  . Dementia ([place]) 11/10/2018   Clarene Critchley PT, DPT 7:40 AM, 12/15/19 [place], [place], [address] Phone: [phone]   Fax:  [phone]  Name: NAVY BELAY MRN: [id number] Date of Birth: [birth year]

## 2019-12-25 DIAGNOSIS — R296 Repeated falls: Secondary | ICD-10-CM | POA: Diagnosis not present

## 2019-12-25 DIAGNOSIS — R351 Nocturia: Secondary | ICD-10-CM | POA: Diagnosis not present

## 2019-12-25 DIAGNOSIS — G3185 Corticobasal degeneration: Secondary | ICD-10-CM | POA: Diagnosis not present

## 2019-12-25 DIAGNOSIS — I10 Essential (primary) hypertension: Secondary | ICD-10-CM | POA: Diagnosis not present

## 2019-12-25 DIAGNOSIS — G231 Progressive supranuclear ophthalmoplegia [Steele-Richardson-Olszewski]: Secondary | ICD-10-CM | POA: Diagnosis not present

## 2019-12-27 ENCOUNTER — Ambulatory Visit (HOSPITAL_COMMUNITY): Payer: Medicare PPO | Admitting: Physical Therapy

## 2020-01-04 DIAGNOSIS — D519 Vitamin B12 deficiency anemia, unspecified: Secondary | ICD-10-CM | POA: Diagnosis not present

## 2020-01-17 DIAGNOSIS — E871 Hypo-osmolality and hyponatremia: Secondary | ICD-10-CM | POA: Diagnosis not present

## 2020-01-17 DIAGNOSIS — Z79899 Other long term (current) drug therapy: Secondary | ICD-10-CM | POA: Diagnosis not present

## 2020-01-17 DIAGNOSIS — I10 Essential (primary) hypertension: Secondary | ICD-10-CM | POA: Diagnosis not present

## 2020-01-24 DIAGNOSIS — G231 Progressive supranuclear ophthalmoplegia [Steele-Richardson-Olszewski]: Secondary | ICD-10-CM | POA: Diagnosis not present

## 2020-01-24 DIAGNOSIS — I10 Essential (primary) hypertension: Secondary | ICD-10-CM | POA: Diagnosis not present

## 2020-01-24 DIAGNOSIS — E871 Hypo-osmolality and hyponatremia: Secondary | ICD-10-CM | POA: Diagnosis not present

## 2020-01-25 DIAGNOSIS — Z85820 Personal history of malignant melanoma of skin: Secondary | ICD-10-CM | POA: Diagnosis not present

## 2020-01-25 DIAGNOSIS — L570 Actinic keratosis: Secondary | ICD-10-CM | POA: Diagnosis not present

## 2020-01-25 DIAGNOSIS — X32XXXD Exposure to sunlight, subsequent encounter: Secondary | ICD-10-CM | POA: Diagnosis not present

## 2020-01-25 DIAGNOSIS — Z08 Encounter for follow-up examination after completed treatment for malignant neoplasm: Secondary | ICD-10-CM | POA: Diagnosis not present

## 2020-01-25 DIAGNOSIS — Z1283 Encounter for screening for malignant neoplasm of skin: Secondary | ICD-10-CM | POA: Diagnosis not present

## 2020-01-25 DIAGNOSIS — D225 Melanocytic nevi of trunk: Secondary | ICD-10-CM | POA: Diagnosis not present

## 2020-02-06 DIAGNOSIS — E538 Deficiency of other specified B group vitamins: Secondary | ICD-10-CM | POA: Diagnosis not present

## 2020-02-15 ENCOUNTER — Observation Stay (HOSPITAL_COMMUNITY): Payer: Medicare PPO

## 2020-02-15 ENCOUNTER — Inpatient Hospital Stay (HOSPITAL_COMMUNITY)
Admission: EM | Admit: 2020-02-15 | Discharge: 2020-02-19 | DRG: 082 | Disposition: A | Discharge status: Skilled Nursing Facility | Payer: Medicare PPO | Attending: Family Medicine | Admitting: Family Medicine

## 2020-02-15 ENCOUNTER — Other Ambulatory Visit: Payer: Self-pay

## 2020-02-15 ENCOUNTER — Encounter (HOSPITAL_COMMUNITY): Payer: Self-pay | Admitting: Emergency Medicine

## 2020-02-15 ENCOUNTER — Emergency Department (HOSPITAL_COMMUNITY): Payer: Medicare PPO

## 2020-02-15 DIAGNOSIS — I4891 Unspecified atrial fibrillation: Secondary | ICD-10-CM | POA: Diagnosis not present

## 2020-02-15 DIAGNOSIS — E871 Hypo-osmolality and hyponatremia: Secondary | ICD-10-CM | POA: Diagnosis not present

## 2020-02-15 DIAGNOSIS — E538 Deficiency of other specified B group vitamins: Secondary | ICD-10-CM | POA: Diagnosis present

## 2020-02-15 DIAGNOSIS — S065X0A Traumatic subdural hemorrhage without loss of consciousness, initial encounter: Secondary | ICD-10-CM | POA: Diagnosis not present

## 2020-02-15 DIAGNOSIS — R531 Weakness: Secondary | ICD-10-CM | POA: Diagnosis not present

## 2020-02-15 DIAGNOSIS — Z7982 Long term (current) use of aspirin: Secondary | ICD-10-CM

## 2020-02-15 DIAGNOSIS — R000 Tachycardia, unspecified: Secondary | ICD-10-CM | POA: Diagnosis not present

## 2020-02-15 DIAGNOSIS — G9341 Metabolic encephalopathy: Secondary | ICD-10-CM | POA: Diagnosis present

## 2020-02-15 DIAGNOSIS — R262 Difficulty in walking, not elsewhere classified: Secondary | ICD-10-CM | POA: Diagnosis present

## 2020-02-15 DIAGNOSIS — R9082 White matter disease, unspecified: Secondary | ICD-10-CM | POA: Diagnosis not present

## 2020-02-15 DIAGNOSIS — Z833 Family history of diabetes mellitus: Secondary | ICD-10-CM

## 2020-02-15 DIAGNOSIS — S065XAA Traumatic subdural hemorrhage with loss of consciousness status unknown, initial encounter: Secondary | ICD-10-CM | POA: Diagnosis present

## 2020-02-15 DIAGNOSIS — W19XXXA Unspecified fall, initial encounter: Secondary | ICD-10-CM | POA: Diagnosis not present

## 2020-02-15 DIAGNOSIS — G231 Progressive supranuclear ophthalmoplegia [Steele-Richardson-Olszewski]: Secondary | ICD-10-CM | POA: Diagnosis present

## 2020-02-15 DIAGNOSIS — I10 Essential (primary) hypertension: Secondary | ICD-10-CM | POA: Diagnosis not present

## 2020-02-15 DIAGNOSIS — R269 Unspecified abnormalities of gait and mobility: Secondary | ICD-10-CM

## 2020-02-15 DIAGNOSIS — F0390 Unspecified dementia without behavioral disturbance: Secondary | ICD-10-CM | POA: Diagnosis present

## 2020-02-15 DIAGNOSIS — S065X9A Traumatic subdural hemorrhage with loss of consciousness of unspecified duration, initial encounter: Secondary | ICD-10-CM | POA: Diagnosis not present

## 2020-02-15 DIAGNOSIS — R4182 Altered mental status, unspecified: Secondary | ICD-10-CM | POA: Diagnosis not present

## 2020-02-15 DIAGNOSIS — I6201 Nontraumatic acute subdural hemorrhage: Secondary | ICD-10-CM | POA: Diagnosis not present

## 2020-02-15 DIAGNOSIS — G259 Extrapyramidal and movement disorder, unspecified: Secondary | ICD-10-CM | POA: Diagnosis present

## 2020-02-15 DIAGNOSIS — Z79899 Other long term (current) drug therapy: Secondary | ICD-10-CM

## 2020-02-15 DIAGNOSIS — Z801 Family history of malignant neoplasm of trachea, bronchus and lung: Secondary | ICD-10-CM

## 2020-02-15 DIAGNOSIS — Y92010 Kitchen of single-family (private) house as the place of occurrence of the external cause: Secondary | ICD-10-CM

## 2020-02-15 DIAGNOSIS — R52 Pain, unspecified: Secondary | ICD-10-CM | POA: Diagnosis not present

## 2020-02-15 DIAGNOSIS — Z20822 Contact with and (suspected) exposure to covid-19: Secondary | ICD-10-CM | POA: Diagnosis present

## 2020-02-15 LAB — CBC WITH DIFFERENTIAL/PLATELET
Abs Immature Granulocytes: 0.03 10*3/uL (ref 0.00–0.07)
Basophils Absolute: 0 10*3/uL (ref 0.0–0.1)
Basophils Relative: 1 %
Eosinophils Absolute: 0.1 10*3/uL (ref 0.0–0.5)
Eosinophils Relative: 1 %
HCT: 37.7 % — ABNORMAL LOW (ref 39.0–52.0)
Hemoglobin: 12.9 g/dL — ABNORMAL LOW (ref 13.0–17.0)
Immature Granulocytes: 1 %
Lymphocytes Relative: 9 %
Lymphs Abs: 0.5 10*3/uL — ABNORMAL LOW (ref 0.7–4.0)
MCH: 33.2 pg (ref 26.0–34.0)
MCHC: 34.2 g/dL (ref 30.0–36.0)
MCV: 96.9 fL (ref 80.0–100.0)
Monocytes Absolute: 0.6 10*3/uL (ref 0.1–1.0)
Monocytes Relative: 12 %
Neutro Abs: 4.3 10*3/uL (ref 1.7–7.7)
Neutrophils Relative %: 76 %
Platelets: 284 10*3/uL (ref 150–400)
RBC: 3.89 MIL/uL — ABNORMAL LOW (ref 4.22–5.81)
RDW: 12 % (ref 11.5–15.5)
WBC: 5.5 10*3/uL (ref 4.0–10.5)
nRBC: 0 % (ref 0.0–0.2)

## 2020-02-15 LAB — SARS CORONAVIRUS 2 BY RT PCR (HOSPITAL ORDER, PERFORMED IN ~~LOC~~ HOSPITAL LAB): SARS Coronavirus 2: NEGATIVE

## 2020-02-15 LAB — COMPREHENSIVE METABOLIC PANEL
ALT: 9 U/L (ref 0–44)
AST: 19 U/L (ref 15–41)
Albumin: 4.5 g/dL (ref 3.5–5.0)
Alkaline Phosphatase: 56 U/L (ref 38–126)
Anion gap: 11 (ref 5–15)
BUN: 21 mg/dL (ref 8–23)
CO2: 24 mmol/L (ref 22–32)
Calcium: 9.1 mg/dL (ref 8.9–10.3)
Chloride: 87 mmol/L — ABNORMAL LOW (ref 98–111)
Creatinine, Ser: 1.18 mg/dL (ref 0.61–1.24)
GFR calc Af Amer: >60 mL/min (ref >60)
GFR calc non Af Amer: 56 mL/min — ABNORMAL LOW (ref >60)
Glucose, Bld: 125 mg/dL — ABNORMAL HIGH (ref 70–99)
Potassium: 4.1 mmol/L (ref 3.5–5.1)
Sodium: 122 mmol/L — ABNORMAL LOW (ref 135–145)
Total Bilirubin: 0.8 mg/dL (ref 0.3–1.2)
Total Protein: 7.4 g/dL (ref 6.5–8.1)

## 2020-02-15 LAB — PROTIME-INR
INR: 1 (ref 0.8–1.2)
Prothrombin Time: 12.7 seconds (ref 11.4–15.2)

## 2020-02-15 LAB — MAGNESIUM: Magnesium: 1.9 mg/dL (ref 1.7–2.4)

## 2020-02-15 LAB — TSH: TSH: 3.583 u[IU]/mL (ref 0.350–4.500)

## 2020-02-15 LAB — CBG MONITORING, ED: Glucose-Capillary: 122 mg/dL — ABNORMAL HIGH (ref 70–99)

## 2020-02-15 LAB — APTT: aPTT: 26 seconds (ref 24–36)

## 2020-02-15 MED ORDER — BENAZEPRIL HCL 40 MG PO TABS: 40.0000 mg | ORAL_TABLET | Freq: Every morning | ORAL | Status: DC

## 2020-02-15 MED ORDER — ACETAMINOPHEN 650 MG RE SUPP: 650.0000 mg | Freq: Four times a day (QID) | RECTAL | Status: DC | PRN

## 2020-02-15 MED ORDER — AMLODIPINE BESYLATE 5 MG PO TABS: 5.0000 mg | ORAL_TABLET | Freq: Every morning | ORAL | Status: DC

## 2020-02-15 MED ORDER — CARBIDOPA-LEVODOPA 25-100 MG PO TABS: 1.0000 | ORAL_TABLET | Freq: Three times a day (TID) | ORAL | Status: DC

## 2020-02-15 MED ORDER — MEMANTINE HCL 10 MG PO TABS: 10.0000 mg | ORAL_TABLET | Freq: Every morning | ORAL | Status: DC

## 2020-02-15 MED ORDER — LABETALOL HCL 5 MG/ML IV SOLN: 10.0000 mg | INTRAVENOUS | Status: DC | PRN

## 2020-02-15 MED ORDER — SODIUM CHLORIDE 0.9 % IV BOLUS
500.0000 mL | Freq: Once | INTRAVENOUS | Status: AC
  Administered 2020-02-15: 500 mL via INTRAVENOUS

## 2020-02-15 MED ORDER — LABETALOL HCL 5 MG/ML IV SOLN
5.0000 mg | INTRAVENOUS | Status: DC | PRN
  Administered 2020-02-17: 5 mg via INTRAVENOUS
  Filled 2020-02-15: qty 4

## 2020-02-15 MED ORDER — LEVETIRACETAM IN NACL 500 MG/100ML IV SOLN
500.0000 mg | Freq: Two times a day (BID) | INTRAVENOUS | Status: DC
  Administered 2020-02-15 – 2020-02-16 (×3): 500 mg via INTRAVENOUS
  Filled 2020-02-15 (×3): qty 100

## 2020-02-15 MED ORDER — ONDANSETRON HCL 4 MG PO TABS: 4.0000 mg | ORAL_TABLET | Freq: Four times a day (QID) | ORAL | Status: DC | PRN

## 2020-02-15 MED ORDER — ACETAMINOPHEN 325 MG PO TABS: 650.0000 mg | ORAL_TABLET | Freq: Four times a day (QID) | ORAL | Status: DC | PRN

## 2020-02-15 MED ORDER — SODIUM CHLORIDE 0.9 % IV SOLN: INTRAVENOUS | Status: DC

## 2020-02-15 MED ORDER — HYDRALAZINE HCL 25 MG PO TABS: 25.0000 mg | ORAL_TABLET | Freq: Three times a day (TID) | ORAL | Status: DC

## 2020-02-15 MED ORDER — ONDANSETRON HCL 4 MG/2ML IJ SOLN: 4.0000 mg | Freq: Four times a day (QID) | INTRAMUSCULAR | Status: DC | PRN

## 2020-02-15 MED ORDER — DEXTROSE-NACL 5-0.9 % IV SOLN: INTRAVENOUS | Status: DC

## 2020-02-15 MED ORDER — POLYETHYLENE GLYCOL 3350 17 G PO PACK: 17.0000 g | PACK | Freq: Every day | ORAL | Status: DC | PRN

## 2020-02-15 NOTE — H&P (Addendum)
History and Physical    Luis Freeman VVO:[id number] DOB: 1932-12-13 DOA: 02/15/2020  PCP: [name], MD   Patient coming from: Home  I have personally briefly reviewed patient's old medical records in [place]  Chief Complaint: Fall  HPI: Luis Freeman is a 86 y.o. male with medical history significant for dementia, progressive supranuclear palsy, gait abnormality. Patient was brought to the ED with reports of a fall today.  Return for evaluation, patient is confused, unable to give me history, history is obtained from patient daughter- Luis Freeman, who is present at bedside, she was not present when patient fell.  Patient spouse witnessed the fall.  Patient was standing with his spouse in the kitchen making a sandwich when suddenly he said he was very stiff and he could not move, he attempted to move and fell.  Falls in the past week.  He has a history of abnormal gait.   He ambulates with a walker at baseline.  As regards his dementia, he sometimes interacts with family, but daughter reports a delay in understanding when he is spoken to.  Otherwise he can carry out some activities of daily living. No vomiting, no loose stools.  He has maintained good oral intake.  ED Course: Stable vitals.  Sodium 122.  Hemoglobin 12.9.  Platelets 284.  PT INR within normal limits.  Head CT-small left parafalcine subdural hematoma noted posteriorly.  Small right frontal scalp hematoma also noted. EDP talked to neurosurgeon, Dr Luis Freeman.  Brief note in chart ( pls refer to note ) - admit patient here at [facility], no beds available at [facility].  Repeat CT in the morning.   Review of Systems: Unable to ascertain.  Patient is confused.  Past Medical History:  Diagnosis Date  . Chronic low back pain   . Dementia ([place])   . Gait abnormality 11/15/2019  . Gait disorder   . Hypertension   . PSP (progressive supranuclear palsy) ([place]) 11/15/2019  . Vitamin B12 deficiency     Past Surgical  History:  Procedure Laterality Date  . CATARACT EXTRACTION, BILATERAL    . COLONOSCOPY    . COLONOSCOPY  02/04/2011   Procedure: COLONOSCOPY;  Surgeon: Daneil Dolin, MD;  [facility];  Service: Endoscopy;  Laterality: N/A;     reports that he has never smoked. He has never used smokeless tobacco. He reports previous alcohol use. He reports that he does not use drugs.  No Known Allergies  Family History  Problem Relation Age of Onset  . Diabetes Mother   . Cancer Father   . Cancer - Lung Brother     Prior to Admission medications   Medication Sig Start Date End Date Taking? Authorizing Provider  amLODipine (NORVASC) 5 MG tablet Take 5 mg by mouth in the morning.    Yes [provider]  aspirin 81 MG tablet Take 81 mg by mouth in the morning.    Yes [provider]  benazepril (LOTENSIN) 40 MG tablet Take 40 mg by mouth in the morning.    Yes [provider]  carbidopa-levodopa (SINEMET IR) 25-100 MG tablet 1/2 tablet three times a day for 3 weeks, then take one tablet three times a day Patient taking differently: Take 1 tablet by mouth 3 (three) times daily.  11/15/19  Yes Kathrynn Ducking, MD  chlorthalidone (HYGROTON) 25 MG tablet Take 25 mg by mouth every Monday, Wednesday, and Friday.    Yes [provider]  cholecalciferol (VITAMIN D) 1000 units tablet Take 1,000 Units by mouth in the morning.    Yes [provider]  cyanocobalamin (,VITAMIN B-12,) 1000 MCG/ML injection Inject 1,000 mcg into the muscle every 30 (thirty) days.  08/02/15  Yes [provider]  hydrALAZINE (APRESOLINE) 25 MG tablet Take 25 mg by mouth 3 (three) times daily.  07/03/19  Yes [provider]  memantine (NAMENDA) 10 MG tablet Take 10 mg by mouth in the morning.  08/08/19  Yes [provider]  Polyvinyl Alcohol-Povidone PF (REFRESH) 1.4-0.6 % SOLN Apply 1 drop to eye daily as needed (FOR DRY EYE RELIEF).    Yes [provider]    Physical Exam: Exam limited by patient's mental status Vitals:   02/15/20 2000 02/15/20 2015 02/15/20 2045 02/15/20 2115  BP: (!) 135/79 124/78 (!) 148/75 (!) 101/63  Pulse: 69 71 70 75  Resp: 15 14 13 17   SpO2: 95% 95% 98% 98%  Weight:      Height:        Constitutional: Confused. Vitals:   02/15/20 2000 02/15/20 2015 02/15/20 2045 02/15/20 2115  BP: (!) 135/79 124/78 (!) 148/75 (!) 101/63  Pulse: 69 71 70 75  Resp: 15 14 13 17   SpO2: 95% 95% 98% 98%  Weight:      Height:       Eyes: PERRL, lids and conjunctivae normal ENMT: Mucous membranes are moist. .  Neck: normal, supple, no masses, no thyromegaly Respiratory:  Normal respiratory effort. No accessory muscle use.  Cardiovascular: Regular rate and rhythm,  No extremity edema. 2+ pedal pulses.   Abdomen: no tenderness, no masses palpated. No hepatosplenomegaly. Bowel sounds positive.  Musculoskeletal: no clubbing / cyanosis. No joint deformity upper and lower extremities. Good ROM, no contractures. Normal muscle tone.  Skin: no rashes, lesions, ulcers. No induration Neurologic: Exam limited by patient's mental status, no facial asymmetry, limited speech, but appears clear and fluent still confused, moving all extremities spontaneously.  5/5 grip strength. Psychiatric: Alert, not really responding to questions..  Labs on Admission: I have personally reviewed following labs and imaging studies  CBC: Recent Labs  Lab 02/15/20 2002  WBC 5.5  NEUTROABS 4.3  HGB 12.9*  HCT 37.7*  MCV 96.9  PLT 144   Basic Metabolic Panel: Recent Labs  Lab 02/15/20 2002  NA 122*  K 4.1  CL 87*  CO2 24  GLUCOSE 125*  BUN 21  CREATININE 1.18  CALCIUM 9.1   Liver Function Tests: Recent Labs  Lab 02/15/20 2002  AST 19  ALT 9  ALKPHOS 56  BILITOT 0.8  PROT 7.4  ALBUMIN 4.5   Coagulation Profile: Recent Labs  Lab 02/15/20 2002  INR 1.0   CBG: Recent Labs  Lab 02/15/20 2002  GLUCAP 122*     Radiological Exams on Admission: CT Head Wo Contrast  Result Date: 02/15/2020 CLINICAL DATA:  Altered mental status after fall. EXAM: CT HEAD WITHOUT CONTRAST TECHNIQUE: Contiguous axial images were obtained from the base of the skull through the vertex without intravenous contrast. COMPARISON:  Dec 08, 2019. FINDINGS: Brain: Mild chronic ischemic white matter disease is noted. Small left parafalcine subdural hematoma is noted posteriorly. No mass effect or midline shift is noted. No acute infarction or mass lesion is noted. Ventricular size is within normal limits. Vascular: No hyperdense vessel or unexpected calcification. Skull: Normal. Negative for fracture or focal lesion. Sinuses/Orbits: No acute finding. Other: Small right frontal scalp hematoma is noted.  IMPRESSION: Small left parafalcine subdural hematoma is noted posteriorly. Small right frontal scalp hematoma is noted. Critical Value/emergent results were called by telephone at the time of interpretation on 02/15/2020 at 8:31 pm to provider [facility] , who verbally acknowledged these results. Electronically Signed   By: Marijo Conception M.D.   On: 02/15/2020 20:32    EKG: Independently reviewed.  P waves present, but irregular, read as atrial fibrillation by EKG device.  QTc 473.  Rate 100.  Assessment/Plan Principal Problem:   SDH (subdural hematoma) (H[facility]) Active Problems:   Dementia (H[facility])   Movement disorder   PSP (progressive supranuclear palsy) ([facility])   Gait abnormality   Fall With sustained subdural hematoma-head CT shows Small left parafalcine subdural hematoma is noted posteriorly. Small right frontal scalp hematoma is noted.  History of multiple falls.  Falls likely secondary to his movement disorder with hyponatremia contributing. -Patient in ED 12/08/2019 also for fall, head CT at that time with a trace left parafalcine subdural hematoma also. -Neurosurgery consulted by EDP, recommends admission here, no beds at [facility], observation, head CT in the morning,  If it is worse or new intracranial hemorrhage found, please call us for review of the CT scan and for disposition, but likely he would only require longer observation in the hospital in that case. Please see neurosurg note. -Hold home aspirin for 7 days -Keep MAP between 70-100 -Failed bedside swallow eval, will convert Keppra to IV 500 every 12 hourly following neurosurgery recommendations -Neurochecks every 2 hourly  Hyponatremia-sodium 122.  Baseline 1.8-1 30.  Likely from chlorthalidone use. -Obtain urine sodium, urine osmolality -Serum osmolality -Give 538ml N/s Bolus, continue D5/normal saline at 75 cc/h - having difficulty voiding- ~333mls on bladder scan, will start in and out cath for now.   Metabolic encephalopathy with baseline dementia- afebrile, without leukocytosis.  Likely secondary to head trauma and, hyponatremia. -IV fluids, monitor sodium closely -Follow-up UA -Failed bedside swallow eval, n.p.o., hold memantine, donepezil.  Gait abnormality/movement disorder/progressive supranuclear palsy-increasing risk for falls, reports 5 falls in the past week.  History of falls, frequency worsening. -PT evaluation -At baseline ambulates with a walker   HTN-stable. -Hold home chlorthalidone with hyponatremia - NPO hold home Norvasc 5 milligrams, benazepril 40 mg, hydralazine 25 mg -As needed labetalol-5 mg for MAP greater than 614 or systolic > 709.   DVT prophylaxis: SCDs Code Status: Full code-confirmed with daughter at bedside.  Wants to be resuscitated if condition is not terminal. Family Communication: Patient's daughter Tressia Danas at bedside, plan of care explained.  Spouse and patient's daughter are patient's healthcare power of attorney. Disposition Plan: 1 - 2 days. Consults called: None. Admission status: Observation, step down   Bethena Roys MD [facility]  02/15/2020, 10:50 PM

## 2020-02-15 NOTE — Progress Notes (Signed)
Neurosurgery  Reviewed CT scan for this 86 yo M with baseline dementia s/p fall who has a small falcine SDH without mass effect, GCS 15. I have recommended observation overnight and repeat CT head in the morning.  I also recommend keeping MAPs 70-100 range, and 7 days Keppra 500 mg BID, and holding his aspirin for 7 days.  He would be clear for discharge from our standpoint if his repeat CT head is stable.  In this case, since his SDH is small and certainly would not require surgical intervention even if it enlarges, and because [facility] is full, patient can be observed at [facility] and repeat CT head can be performed there.  If it is worse or new intracranial hemorrhage found, please call us for review of the CT scan and for disposition, but likely he would only require longer observation in the hospital in that case. Patient can have diet and activity as tolerated with assistance from neurosurgical standpoint.

## 2020-02-15 NOTE — ED Triage Notes (Signed)
Pt arrives via after wife called EMS after pt had fall in the kitchen tonight. Pt did hit back of head against the wall. Pt arrives confused at this time. Per wife this is new, per daughter pt has hx of dementia.

## 2020-02-16 ENCOUNTER — Observation Stay (HOSPITAL_COMMUNITY): Payer: Medicare PPO

## 2020-02-16 DIAGNOSIS — I6201 Nontraumatic acute subdural hemorrhage: Secondary | ICD-10-CM | POA: Diagnosis not present

## 2020-02-16 DIAGNOSIS — I609 Nontraumatic subarachnoid hemorrhage, unspecified: Secondary | ICD-10-CM | POA: Diagnosis not present

## 2020-02-16 DIAGNOSIS — S066X0A Traumatic subarachnoid hemorrhage without loss of consciousness, initial encounter: Secondary | ICD-10-CM | POA: Diagnosis not present

## 2020-02-16 DIAGNOSIS — S065X9A Traumatic subdural hemorrhage with loss of consciousness of unspecified duration, initial encounter: Secondary | ICD-10-CM | POA: Diagnosis not present

## 2020-02-16 DIAGNOSIS — S065X0A Traumatic subdural hemorrhage without loss of consciousness, initial encounter: Secondary | ICD-10-CM | POA: Diagnosis not present

## 2020-02-16 DIAGNOSIS — I10 Essential (primary) hypertension: Secondary | ICD-10-CM | POA: Diagnosis present

## 2020-02-16 LAB — URINALYSIS, COMPLETE (UACMP) WITH MICROSCOPIC
Bacteria, UA: NONE SEEN
Bilirubin Urine: NEGATIVE
Glucose, UA: NEGATIVE mg/dL
Hgb urine dipstick: NEGATIVE
Ketones, ur: 5 mg/dL — AB
Leukocytes,Ua: NEGATIVE
Nitrite: NEGATIVE
Protein, ur: NEGATIVE mg/dL
Specific Gravity, Urine: 1.012 (ref 1.005–1.030)
pH: 6 (ref 5.0–8.0)

## 2020-02-16 LAB — BASIC METABOLIC PANEL
Anion gap: 7 (ref 5–15)
Anion gap: 6 (ref 5–15)
BUN: 17 mg/dL (ref 8–23)
BUN: 14 mg/dL (ref 8–23)
CO2: 25 mmol/L (ref 22–32)
CO2: 27 mmol/L (ref 22–32)
Calcium: 8.4 mg/dL — ABNORMAL LOW (ref 8.9–10.3)
Calcium: 8.9 mg/dL (ref 8.9–10.3)
Chloride: 93 mmol/L — ABNORMAL LOW (ref 98–111)
Chloride: 95 mmol/L — ABNORMAL LOW (ref 98–111)
Creatinine, Ser: 0.87 mg/dL (ref 0.61–1.24)
Creatinine, Ser: 0.85 mg/dL (ref 0.61–1.24)
GFR calc Af Amer: >60 mL/min (ref >60)
GFR calc Af Amer: >60 mL/min (ref >60)
GFR calc non Af Amer: >60 mL/min (ref >60)
GFR calc non Af Amer: >60 mL/min (ref >60)
Glucose, Bld: 106 mg/dL — ABNORMAL HIGH (ref 70–99)
Glucose, Bld: 101 mg/dL — ABNORMAL HIGH (ref 70–99)
Potassium: 3.6 mmol/L (ref 3.5–5.1)
Potassium: 4 mmol/L (ref 3.5–5.1)
Sodium: 125 mmol/L — ABNORMAL LOW (ref 135–145)
Sodium: 128 mmol/L — ABNORMAL LOW (ref 135–145)

## 2020-02-16 LAB — RAPID URINE DRUG SCREEN, HOSP PERFORMED
Amphetamines: NOT DETECTED
Barbiturates: NOT DETECTED
Benzodiazepines: NOT DETECTED
Cocaine: NOT DETECTED
Opiates: NOT DETECTED
Tetrahydrocannabinol: NOT DETECTED

## 2020-02-16 LAB — SODIUM, URINE, RANDOM: Sodium, Ur: 49 mmol/L

## 2020-02-16 LAB — OSMOLALITY, URINE: Osmolality, Ur: 408 mOsm/kg (ref 300–900)

## 2020-02-16 LAB — OSMOLALITY: Osmolality: 261 mOsm/kg — ABNORMAL LOW (ref 275–295)

## 2020-02-16 LAB — MRSA PCR SCREENING: MRSA by PCR: NEGATIVE

## 2020-02-16 MED ORDER — HYDRALAZINE HCL 25 MG PO TABS
25.0000 mg | ORAL_TABLET | Freq: Two times a day (BID) | ORAL | Status: DC
  Administered 2020-02-16 – 2020-02-19 (×6): 25 mg via ORAL
  Filled 2020-02-16 (×6): qty 1

## 2020-02-16 MED ORDER — BENAZEPRIL HCL 10 MG PO TABS
20.0000 mg | ORAL_TABLET | Freq: Every day | ORAL | Status: DC
  Administered 2020-02-17 – 2020-02-18 (×2): 20 mg via ORAL
  Filled 2020-02-16 (×2): qty 2

## 2020-02-16 MED ORDER — AMLODIPINE BESYLATE 5 MG PO TABS
5.0000 mg | ORAL_TABLET | Freq: Every day | ORAL | Status: DC
  Administered 2020-02-17 – 2020-02-18 (×2): 5 mg via ORAL
  Filled 2020-02-16 (×2): qty 1

## 2020-02-16 MED ORDER — CHLORHEXIDINE GLUCONATE CLOTH 2 % EX PADS
6.0000 | MEDICATED_PAD | Freq: Every day | CUTANEOUS | Status: DC
  Administered 2020-02-16: 6 via TOPICAL

## 2020-02-16 NOTE — Evaluation (Signed)
Clinical/Bedside Swallow Evaluation Patient Details  Name: Luis Freeman MRN: [id number] Date of Birth: 07-24-1932  Today's Date: 02/16/2020 Time: SLP Start Time (ACUTE ONLY): 81 SLP Stop Time (ACUTE ONLY): 1127 SLP Time Calculation (min) (ACUTE ONLY): 24 min  Past Medical History:  Past Medical History:  Diagnosis Date  . Chronic low back pain   . Dementia ([place])   . Gait abnormality 11/15/2019  . Gait disorder   . Hypertension   . PSP (progressive supranuclear palsy) ([place]) 11/15/2019  . Vitamin B12 deficiency    Past Surgical History:  Past Surgical History:  Procedure Laterality Date  . CATARACT EXTRACTION, BILATERAL    . COLONOSCOPY    . COLONOSCOPY  02/04/2011   Procedure: COLONOSCOPY;  Surgeon: Daneil Dolin, MD;  Location: [facility];  Service: Endoscopy;  Laterality: N/A;   HPI:  Luis Freeman is a 86 y.o. male with medical history significant for dementia, progressive supranuclear palsy, gait abnormality.Patient was brought to the ED with reports of a fall today.  Return for evaluation, patient is confused, unable to give me history, history is obtained from patient daughter- [name], who is present at bedside, she was not present when patient fell.  Patient spouse witnessed the fall.  Patient was standing with his spouse in the kitchen making a sandwich when suddenly he said he was very stiff and he could not move, he attempted to move and fell.  Falls in the past week.  He has a history of abnormal gait.  He ambulates with a walker at baseline.  As regards his dementia, he sometimes interacts with family, but daughter reports a delay in understanding when he is spoken to.  Otherwise he can carry out some activities of daily living. No vomiting, no loose stools.  He has maintained good oral intake. Pt has previously been treated in the Outpatient setting for cognition. BSE ordered   Assessment / Plan / Recommendation Clinical Impression  Clinical swallowing  evaluation completed while Pt was seated upright in bed, Pt consumed regular textures, thin liquids and puree textures with a slow methodical consumption rate, upon palpation what seemed to be a timely swallow and no coughing or overt s/sx of oropharyngeal dysphagia. Note Pt does have multiple risk factors for dysphagia including PSP diagnosis (dysphagia is associated with PSP & Pt already presents with slowed oral motor precision), decreased vocal intensity and a weak cough on command. Recommend initiate D3/mechanical soft diet and thin liquids with precautions re: Pt should be sitting upright for all PO, alternate bites and sips, slow rate, cues to occasionally clear throat. Meds are ok whole with liquids. ST will f/u X1 to ensure diet tolerance. SLP Visit Diagnosis: Dysphagia, unspecified (R13.10)    Aspiration Risk  Mild aspiration risk;Moderate aspiration risk    Diet Recommendation Dysphagia 3 (Mech soft);Thin liquid   Liquid Administration via: Cup;Straw Medication Administration: Whole meds with liquid Supervision: Patient able to self feed;Intermittent supervision to cue for compensatory strategies Compensations: Minimize environmental distractions;Slow rate;Small sips/bites;Follow solids with liquid;Clear throat intermittently Postural Changes: Seated upright at 90 degrees;Remain upright for at least 30 minutes after po intake    Other  Recommendations Oral Care Recommendations: Oral care BID   Follow up Recommendations 24 hour supervision/assistance      Frequency and Duration min 1 x/week  1 week       Prognosis Prognosis for Safe Diet Advancement: Fair Barriers to Reach Goals: Cognitive deficits      Swallow Study  General Date of Onset: 02/15/20 HPI: Luis Freeman is a 86 y.o. male with medical history significant for dementia, progressive supranuclear palsy, gait abnormality.Patient was brought to the ED with reports of a fall today.  Return for evaluation, patient is  confused, unable to give me history, history is obtained from patient daughter- [name], who is present at bedside, she was not present when patient fell.  Patient spouse witnessed the fall.  Patient was standing with his spouse in the kitchen making a sandwich when suddenly he said he was very stiff and he could not move, he attempted to move and fell.  Falls in the past week.  He has a history of abnormal gait.  He ambulates with a walker at baseline.  As regards his dementia, he sometimes interacts with family, but daughter reports a delay in understanding when he is spoken to.  Otherwise he can carry out some activities of daily living. No vomiting, no loose stools.  He has maintained good oral intake. Pt has previously been treated in the Outpatient setting for cognition. BSE ordered Type of Study: Bedside Swallow Evaluation Previous Swallow Assessment: none in chart Diet Prior to this Study: NPO Temperature Spikes Noted: No Respiratory Status: Room air History of Recent Intubation: No Behavior/Cognition: Alert;Cooperative;Pleasant mood Oral Cavity Assessment: Within Functional Limits Oral Care Completed by SLP: Recent completion by staff Vision: Functional for self-feeding Self-Feeding Abilities: Able to feed self Patient Positioning: Upright in bed Baseline Vocal Quality: Low vocal intensity Volitional Cough: Weak Volitional Swallow: Able to elicit    Oral/Motor/Sensory Function Overall Oral Motor/Sensory Function: Generalized oral weakness   Ice Chips Ice chips: Within functional limits   Thin Liquid Thin Liquid: Within functional limits    Nectar Thick Nectar Thick Liquid: Not tested   Honey Thick Honey Thick Liquid: Not tested   Puree Puree: Within functional limits   Solid     Solid: Within functional limits     Amelia H. Roddie Mc, CCC-SLP Speech Language Pathologist  Wende Bushy 02/16/2020,11:34 AM

## 2020-02-16 NOTE — Progress Notes (Signed)
Patient Demographics:    Luis Freeman, is a 86 y.o. male, DOB - 1932/10/10, [id number]  Admit date - 02/15/2020   Admitting Physician Ejiroghene Arlyce Dice, MD  Outpatient Primary MD for the patient is [name], MD  LOS - 0   Chief Complaint  Patient presents with  . Fall  . Altered Mental Status        Subjective:    Luis Freeman today has no fevers, no emesis,  No chest pain,   --Gait concerns persist -Daughter [name] present at bedside --has had 3 BMs this am --  Assessment  & Plan :    Principal Problem:   SDH (subdural hematoma) ([facility]) Active Problems:   PSP (progressive supranuclear palsy) ([facility])   Gait abnormality   Movement disorder   HTN (hypertension)  Brief Summary:- -86 year old with past medical history relevant for progressive supranuclear palsy with significant gait and speech problems, HTN and history of B12 deficiency admitted on 02/15/2020 after a fall with traumatic subdural hemorrhage -Repeat CT head on 02/16/2020 showed stable subdural hemorrhage on the left  A/p 1)Status post fall with traumatic Lt SDH--  Repeat CT head on 02/16/20 stable from neurological standpoint -No additional new focal neurological deficits -Continue to monitor closely -Avoid antiplatelets and anticoagulants -Fall precautions  2)Ambulatory dysfunction/unsteady gait/recurrent falls----PT eval appreciated recommends SNF rehab  PT evaluations performed. SNF Rehab recommended. SNF appropriate as  the 3 day period has been waived by the patient's insurance company) and is felt to need rehab services to restore this patient to their prior level of function to achieve safe transition back to home care. This patient needs rehab services for at least 5 days per week and skilled nursing services daily to facilitate this transition. Rehab is being requested as the most appropriate d/c option for this  patient and is NOT felt to be for custodial care as evidenced by previously independent with ADLs including not limited to grooming, toileting, cooking and other mobility related ADLs prior to admission --PTA patient lives with his elderly wife and with limited support from time to time from family members --Patient is now more unsteady more at risk of falling and requires SNF rehab to return to his pre- admission level of functioning  3) progressive supranuclear palsy--- with significant gait, balance and ambulatory concerns as well as speech concerns --Continue Sinemet  4)HTN--- stop chlorthalidone due to electrolyte concerns, continue amlodipine 5 mg daily and benazepril can be reduced to 20 mg daily -Restart hydralazine at 25 mg twice daily  5) B12 deficiency----patient gets monthly B12 shots  Disposition/Need for in-Hospital Stay- patient unable to be discharged at this time due to unsafe discharge plan, patient admitted with fall and closed head injury with traumatic scalp dural hemorrhage--- needs SNF rehab to avoid further falls--please see problem #2 above  Status is: obs   Disposition: The patient is from: Home              Anticipated d/c is to: Home              Anticipated d/c date is: 1 day              Patient currently is medically stable to d/c. Barriers: Not Clinically Stable- -  unsafe discharge plan, patient admitted with fall and closed head injury with traumatic scalp dural hemorrhage--- needs SNF rehab to avoid further falls--please see problem #2 above  Code Status : Full  Family Communication:    (patient is alert, awake and coherent) --- --Discussed with Daughter [name] present at bedside  Consults  : NeuroSurgeon  DVT Prophylaxis  :    - SCDs  Lab Results  Component Value Date   PLT 284 02/15/2020    Inpatient Medications  Scheduled Meds: . Chlorhexidine Gluconate Cloth  6 each Topical Daily   Continuous Infusions: . dextrose 5 % and 0.9% NaCl 75  mL/hr at 02/16/20 0644  . levETIRAcetam 500 mg (02/16/20 1104)   PRN Meds:.acetaminophen **OR** acetaminophen, labetalol, ondansetron **OR** ondansetron (ZOFRAN) IV    Anti-infectives (From admission, onward)   None        Objective:   Vitals:   02/16/20 0636 02/16/20 0700 02/16/20 0800 02/16/20 0900  BP: (!) 150/78 (!) 147/67 (!) 158/68 (!) 154/74  Pulse: 75 70 71 71  Resp: 12 15 14  (!) 11  Temp: 98.3 F (36.8 C)     TempSrc: Oral     SpO2:  99% 99% 98%  Weight: 65.3 kg     Height: 5\' 11"  (1.803 m)       Wt Readings from Last 3 Encounters:  02/16/20 65.3 kg  12/08/19 70.3 kg  11/15/19 71.7 kg     Intake/Output Summary (Last 24 hours) at 02/16/2020 1329 Last data filed at 02/16/2020 0420 Gross per 24 hour  Intake --  Output 200 ml  Net -200 ml     Physical Exam  Gen:- Awake Alert,  In no apparent distress  HEENT:- [place].AT, No sclera icterus Neck-Supple Neck,No JVD,.  Lungs-  CTAB , fair symmetrical air movement CV- S1, S2 normal, regular  Abd-  +ve B.Sounds, Abd Soft, No tenderness,    Extremity/Skin:- No  edema, pedal pulses present  Psych-affect is appropriate, oriented x3 Neuro-generalized weakness, unsteady gait no new focal deficits, no tremors   Data Review:   Micro Results Recent Results (from the past 240 hour(s))  SARS Coronavirus 2 by RT PCR (hospital order, performed in [facility] hospital lab) Nasopharyngeal Nasopharyngeal Swab     Status: None   Collection Time: 02/15/20  9:07 PM   Specimen: Nasopharyngeal Swab  Result Value Ref Range Status   SARS Coronavirus 2 NEGATIVE NEGATIVE Final    Comment: (NOTE) SARS-CoV-2 target nucleic acids are NOT DETECTED.  The SARS-CoV-2 RNA is generally detectable in upper and lower respiratory specimens during the acute phase of infection. The lowest concentration of SARS-CoV-2 viral copies this assay can detect is 250 copies / mL. A negative result does not preclude SARS-CoV-2 infection and should not  be used as the sole basis for treatment or other patient management decisions.  A negative result may occur with improper specimen collection / handling, submission of specimen other than nasopharyngeal swab, presence of viral mutation(s) within the areas targeted by this assay, and inadequate number of viral copies (<250 copies / mL). A negative result must be combined with clinical observations, patient history, and epidemiological information.  Fact Sheet for Patients:   StrictlyIdeas.no  Fact Sheet for Healthcare Providers: BankingDealers.co.za  This test is not yet approved or  cleared by the [place] FDA and has been authorized for detection and/or diagnosis of SARS-CoV-2 by FDA under an Emergency Use Authorization (EUA).  This EUA will remain in effect (meaning this test  can be used) for the duration of the COVID-19 declaration under Section 564(b)(1) of the Act, 21 U.S.C. section 360bbb-3(b)(1), unless the authorization is terminated or revoked sooner.  Performed at [facility], [address], [place], [place] [address]     Radiology Reports CT HEAD WO CONTRAST  Result Date: 02/16/2020 CLINICAL DATA:  Follow-up subdural EXAM: CT HEAD WITHOUT CONTRAST TECHNIQUE: Contiguous axial images were obtained from the base of the skull through the vertex without intravenous contrast. COMPARISON:  February 15, 2020 FINDINGS: Brain: Revisualization of a LEFT para falcine subdural hematoma. It measures approximately 7 mm in thickness, similar in comparison to prior. There is trace subarachnoid hemorrhage overlying the LEFT frontal vertex, unchanged in comparison to prior. No midline shift. Unchanged size and configuration of the ventricular system. No new hemorrhage. Vascular: Vascular calcifications. Skull: Normal. Negative for fracture or focal lesion. Sinuses/Orbits: Status post cataract surgery. Other: None. IMPRESSION: Similar appearance of  LEFT parafalcine subdural hematoma and trace LEFT frontal subarachnoid hemorrhage. Electronically Signed   By: Valentino Saxon MD   On: 02/16/2020 09:43   CT Head Wo Contrast  Result Date: 02/15/2020 CLINICAL DATA:  Altered mental status after fall. EXAM: CT HEAD WITHOUT CONTRAST TECHNIQUE: Contiguous axial images were obtained from the base of the skull through the vertex without intravenous contrast. COMPARISON:  Dec 08, 2019. FINDINGS: Brain: Mild chronic ischemic white matter disease is noted. Small left parafalcine subdural hematoma is noted posteriorly. No mass effect or midline shift is noted. No acute infarction or mass lesion is noted. Ventricular size is within normal limits. Vascular: No hyperdense vessel or unexpected calcification. Skull: Normal. Negative for fracture or focal lesion. Sinuses/Orbits: No acute finding. Other: Small right frontal scalp hematoma is noted. IMPRESSION: Small left parafalcine subdural hematoma is noted posteriorly. Small right frontal scalp hematoma is noted. Critical Value/emergent results were called by telephone at the time of interpretation on 02/15/2020 at 8:31 pm to provider [facility] , who verbally acknowledged these results. Electronically Signed   By: Marijo Conception M.D.   On: 02/15/2020 20:32     CBC Recent Labs  Lab 02/15/20 2002  WBC 5.5  HGB 12.9*  HCT 37.7*  PLT 284  MCV 96.9  MCH 33.2  MCHC 34.2  RDW 12.0  LYMPHSABS 0.5*  MONOABS 0.6  EOSABS 0.1  BASOSABS 0.0    Chemistries  Recent Labs  Lab 02/15/20 2002 02/16/20 0208 02/16/20 0549  NA 122* 125* 128*  K 4.1 3.6 4.0  CL 87* 93* 95*  CO2 24 25 27   GLUCOSE 125* 106* 101*  BUN 21 17 14   CREATININE 1.18 0.87 0.85  CALCIUM 9.1 8.4* 8.9  MG 1.9  --   --   AST 19  --   --   ALT 9  --   --   ALKPHOS 56  --   --   BILITOT 0.8  --   --    ------------------------------------------------------------------------------------------------------------------ No results for  input(s): CHOL, HDL, LDLCALC, TRIG, CHOLHDL, LDLDIRECT in the last 72 hours.  No results found for: HGBA1C ------------------------------------------------------------------------------------------------------------------ Recent Labs    02/15/20 2002  TSH 3.583   ------------------------------------------------------------------------------------------------------------------ No results for input(s): VITAMINB12, FOLATE, FERRITIN, TIBC, IRON, RETICCTPCT in the last 72 hours.  Coagulation profile Recent Labs  Lab 02/15/20 2002  INR 1.0    No results for input(s): DDIMER in the last 72 hours.  Cardiac Enzymes No results for input(s): CKMB, TROPONINI, MYOGLOBIN in the last 168 hours.  Invalid input(s):  CK ------------------------------------------------------------------------------------------------------------------ No results found for: BNP   Roxan Hockey M.D on 02/16/2020 at 1:29 PM  Go to [email or web address] - for contact info  [facility] - Office  [phone]

## 2020-02-16 NOTE — Evaluation (Signed)
Physical Therapy Evaluation Patient Details Name: Luis Freeman MRN: [id number] DOB: 09/15/1932 Today's Date: 02/16/2020   History of Present Illness  Luis Freeman is a 86 y.o. male with medical history significant for dementia, progressive supranuclear palsy, gait abnormality.Patient was brought to the ED with reports of a fall today.  Return for evaluation, patient is confused, unable to give me history, history is obtained from patient daughter- Luis Freeman, who is present at bedside, she was not present when patient fell.  Patient spouse witnessed the fall.  Patient was standing with his spouse in the kitchen making a sandwich when suddenly he said he was very stiff and he could not move, he attempted to move and fell.  Falls in the past week.  He has a history of abnormal gait.  He ambulates with a walker at baseline.  As regards his dementia, he sometimes interacts with family, but daughter reports a delay in understanding when he is spoken to.  Otherwise he can carry out some activities of daily living.No vomiting, no loose stools.  He has maintained good oral intake.    Clinical Impression  Patient demonstrates slow labored movement for sitting up at bedside, has difficulty completing sit to stands due to BLE weakness, unsteady on feet and at high risk for falls, frequent bumping into nearby objects when using RW and limited secondary to fatigue.  Patient tolerated sitting up in chair after therapy with family member present.  Patient will benefit from continued physical therapy in hospital and recommended venue below to increase strength, balance, endurance for safe ADLs and gait.      Follow Up Recommendations SNF;Supervision for mobility/OOB;Supervision/Assistance - 24 hour    Equipment Recommendations  None recommended by PT    Recommendations for Other Services       Precautions / Restrictions Precautions Precautions: Fall Restrictions Weight Bearing Restrictions: No       Mobility  Bed Mobility Overal bed mobility: Needs Assistance Bed Mobility: Sit to Supine       Sit to supine: Min guard;Min assist   General bed mobility comments: increased time, labored movement  Transfers Overall transfer level: Needs assistance Equipment used: Rolling walker (2 wheeled) Transfers: Sit to/from [facility] Sit to Stand: Min assist;Mod assist Stand pivot transfers: Min assist       General transfer comment: unsteady on feet, repeated verbal/tactile cueing to follow instructions with fair carryover  Ambulation/Gait Ambulation/Gait assistance: Min assist;Mod assist Gait Distance (Feet): 50 Feet Assistive device: Rolling walker (2 wheeled) Gait Pattern/deviations: Decreased step length - right;Decreased step length - left;Decreased stride length Gait velocity: decreased   General Gait Details: slow labored unsteady cadence without loss of balance, requires repeated verbal/tactile cueing to follow directions, limited secondary to fatigue  Stairs            Wheelchair Mobility    Modified Rankin (Stroke Patients Only)       Balance Overall balance assessment: Needs assistance Sitting-balance support: Feet supported;No upper extremity supported Sitting balance-Leahy Scale: Fair Sitting balance - Comments: fair/good seated at EOB   Standing balance support: During functional activity;Bilateral upper extremity supported Standing balance-Leahy Scale: Fair Standing balance comment: using RW                             Pertinent Vitals/Pain Pain Assessment: No/denies pain    Home Living Family/patient expects to be discharged to:: Private residence Living Arrangements: Spouse/significant other Available Help  at Discharge: Family;Available PRN/intermittently Type of Home: House Home Access: Ramped entrance     Home Layout: Multi-level;Able to live on main level with bedroom/bathroom;Laundry or work area in  basement (Patient does not go to basement) Home Equipment: Environmental consultant - 2 wheels;Cane - single point;Shower seat - built in      Prior Function Level of Independence: Needs assistance   Gait / Transfers Assistance Needed: Supervised household ambulator using RW  ADL's / Homemaking Assistance Needed: home aides 25 hours/week, family members stays at night, patient's spouse unable to assist or supervise patient        Hand Dominance   Dominant Hand: Right    Extremity/Trunk Assessment   Upper Extremity Assessment Upper Extremity Assessment: Defer to OT evaluation    Lower Extremity Assessment Lower Extremity Assessment: Generalized weakness    Cervical / Trunk Assessment Cervical / Trunk Assessment: Kyphotic  Communication   Communication: HOH  Cognition Arousal/Alertness: Awake/alert Behavior During Therapy: WFL for tasks assessed/performed Overall Cognitive Status: History of cognitive impairments - at baseline                                        General Comments      Exercises     Assessment/Plan    PT Assessment Patient needs continued PT services  PT Problem List Decreased strength;Decreased activity tolerance;Decreased balance;Decreased mobility       PT Treatment Interventions Gait training;Stair training;Functional mobility training;Therapeutic activities;Therapeutic exercise;Patient/family education    PT Goals (Current goals can be found in the Care Plan section)  Acute Rehab PT Goals Patient Stated Goal: return home with family to assist PT Goal Formulation: With patient/family Time For Goal Achievement: 03/01/20 Potential to Achieve Goals: Good    Frequency Min 3X/week   Barriers to discharge        Co-evaluation               AM-PAC PT "6 Clicks" Mobility  Outcome Measure Help needed turning from your back to your side while in a flat bed without using bedrails?: None Help needed moving from lying on your back to  sitting on the side of a flat bed without using bedrails?: A Little Help needed moving to and from a bed to a chair (including a wheelchair)?: A Lot Help needed standing up from a chair using your arms (e.g., wheelchair or bedside chair)?: A Lot Help needed to walk in hospital room?: A Lot Help needed climbing 3-5 steps with a railing? : A Lot 6 Click Score: 15    End of Session   Activity Tolerance: Patient tolerated treatment well;Patient limited by fatigue Patient left: in chair;with call bell/phone within reach;with family/visitor present Nurse Communication: Mobility status PT Visit Diagnosis: Unsteadiness on feet (R26.81);Other abnormalities of gait and mobility (R26.89);Muscle weakness (generalized) (M62.81)    Time: 9030-0923 PT Time Calculation (min) (ACUTE ONLY): 37 min   Charges:   PT Evaluation $PT Eval Moderate Complexity: 1 Mod PT Treatments $Therapeutic Activity: 23-37 mins        9:38 AM, 02/16/20 Lonell Grandchild, MPT Physical Therapist with [facility] [phone] office [phone] mobile phone

## 2020-02-16 NOTE — TOC Initial Note (Signed)
Transition of Care [facility]) - Initial/Assessment Note    Patient Details  Name: Luis Freeman MRN: [id number] Date of Birth: 03-Mar-1933  Transition of Care [facility]) CM/SW Contact:    Salome Arnt, LCSW Phone Number: 02/16/2020, 10:59 AM  Clinical Narrative:  LCSW met with pt's daughter, [name] at bedside. Pt very hard of hearing and fell asleep during visit. [name] reports that pt lives with his wife and they have paid caregiver for 25 hours a week. [name] 4 daughters also rotate staying with pt. Pt has been in outpatient PT, OT and speech. Family assist with ADLs and walk with him when possible due to falls. Pt fell and admitted with subdural hematoma. PT evaluated pt and recommend SNF. LCSW discussed placement process, including insurance authorization. [name] requests placement in [place] or [place]. Will initiate bed search. LCSW initiated SNF authorization with [employer].                  Expected Discharge Plan: Skilled Nursing Facility Barriers to Discharge: Continued Medical Work up, Ship broker   Patient Goals and CMS Choice Patient states their goals for this hospitalization and ongoing recovery are:: SNF vs home with around the clock care CMS Medicare.gov Compare Post Acute Care list provided to:: Other (Comment Required) (Daughter- [name]) Choice offered to / list presented to : Adult Children  Expected Discharge Plan and Services Expected Discharge Plan: [place] In-house Referral: Clinical Social Work   Post Acute Care Choice: [place] Living arrangements for the past 2 months: [place] Expected Discharge Date: 02/16/20                                    Prior Living Arrangements/Services Living arrangements for the past 2 months: Single Family Home Lives with:: Spouse Patient language and need for interpreter reviewed:: Yes Do you feel safe going back to the place where you live?:  (Needs SNF)       Need for Family Participation in Patient Care: Yes (Comment) Care giver support system in place?: Yes (comment) Current home services: DME, Sitter (walker, cane) Criminal Activity/Legal Involvement Pertinent to Current Situation/Hospitalization: No - Comment as needed  Activities of Daily Living Home Assistive Devices/Equipment: Blood pressure cuff, Walker (specify type) (front wheel walker) ADL Screening (condition at time of admission) Patient's cognitive ability adequate to safely complete daily activities?: Yes Is the patient deaf or have difficulty hearing?: Yes Does the patient have difficulty seeing, even when wearing glasses/contacts?: No Does the patient have difficulty concentrating, remembering, or making decisions?: Yes Patient able to express need for assistance with ADLs?: No Does the patient have difficulty dressing or bathing?: Yes Independently performs ADLs?: No Communication: Independent Dressing (OT): Needs assistance Is this a change from baseline?: Pre-admission baseline Grooming: Needs assistance Is this a change from baseline?: Pre-admission baseline Feeding: Independent Bathing: Needs assistance Is this a change from baseline?: Pre-admission baseline Toileting: Needs assistance Is this a change from baseline?: Pre-admission baseline In/Out Bed: Needs assistance Is this a change from baseline?: Pre-admission baseline Walks in Home: Needs assistance Is this a change from baseline?: Pre-admission baseline Does the patient have difficulty walking or climbing stairs?: Yes Weakness of Legs: Both Weakness of Arms/Hands: Both  Permission Sought/Granted                  Emotional Assessment Appearance:: Appears stated age Attitude/Demeanor/Rapport: Unable to Assess Affect (typically observed):  Unable to Assess Orientation: : Oriented to Self, Oriented to Place Alcohol / Substance Use: Not Applicable Psych Involvement: No (comment)  Admission  diagnosis:  SDH (subdural hematoma) ([place]) [S06.5X9A] Patient Active Problem List   Diagnosis Date Noted  . SDH (subdural hematoma) ([place]) 02/15/2020  . PSP (progressive supranuclear palsy) ([place]) 11/15/2019  . Gait abnormality 11/15/2019  . Movement disorder 05/18/2019  . Dementia ([place]) 11/10/2018   PCP:  [name], MD Pharmacy:   [facility] [place] [facility] [place] [address] [place] [address] Phone: [phone] Fax: [phone]     Social Determinants of Health (SDOH) Interventions    Readmission Risk Interventions No flowsheet data found.

## 2020-02-16 NOTE — Plan of Care (Signed)
  Problem: SLP Dysphagia Goals Goal: Patient will utilize recommended strategies Description: Patient will utilize recommended strategies during swallow to increase swallowing safety with Flowsheets (Taken 02/16/2020 1136) Patient will utilize recommended strategies during swallow to increase swallowing safety with: mod assist

## 2020-02-16 NOTE — Plan of Care (Signed)
  Problem: Acute Rehab PT Goals(only PT should resolve) Goal: Pt Will Go Supine/Side To Sit Outcome: Progressing Flowsheets (Taken 02/16/2020 0941) Pt will go Supine/Side to Sit:  with min guard assist  with minimal assist Goal: Patient Will Transfer Sit To/From Stand Outcome: Progressing Flowsheets (Taken 02/16/2020 0941) Patient will transfer sit to/from stand:  with min guard assist  with minimal assist Goal: Pt Will Transfer Bed To Chair/Chair To Bed Outcome: Progressing Flowsheets (Taken 02/16/2020 0941) Pt will Transfer Bed to Chair/Chair to Bed:  with min assist  with mod assist Goal: Pt Will Ambulate Outcome: Progressing Flowsheets (Taken 02/16/2020 0941) Pt will Ambulate:  75 feet  with min guard assist  with rolling walker   9:54 AM, 02/16/20 Lonell Grandchild, MPT Physical Therapist with [facility] [phone] office [phone] mobile phone

## 2020-02-16 NOTE — ED Provider Notes (Signed)
Luis Freeman Provider Note   CSN: [id number] Arrival date & time: 02/15/20  1944     History Chief Complaint  Patient presents with  . Fall  . Altered Mental Status    Luis Freeman is a 86 y.o. male.  HPI    86 year old male comes in a chief complaint of fall and altered mental status. Patient was at his home with his wife.  She reports that suddenly patient became stiff and was unable to walk.  When he tried to move he was wobbly and ultimately fell down.  Patient has history of dementia, but able to identify his family members and hold a conversation.  He was confused after the fall and when EMS arrived.   Past Medical History:  Diagnosis Date  . Chronic low back pain   . Dementia ([place])   . Gait abnormality 11/15/2019  . Gait disorder   . Hypertension   . PSP (progressive supranuclear palsy) ([place]) 11/15/2019  . Vitamin B12 deficiency     Patient Active Problem List   Diagnosis Date Noted  . HTN (hypertension) 02/16/2020  . SDH (subdural hematoma) ([place]) 02/15/2020  . PSP (progressive supranuclear palsy) ([place]) 11/15/2019  . Gait abnormality 11/15/2019  . Movement disorder 05/18/2019  . Dementia ([place]) 11/10/2018    Past Surgical History:  Procedure Laterality Date  . CATARACT EXTRACTION, BILATERAL    . COLONOSCOPY    . COLONOSCOPY  02/04/2011   Procedure: COLONOSCOPY;  Surgeon: Daneil Dolin, MD;  Location: [facility];  Service: Endoscopy;  Laterality: N/A;       Family History  Problem Relation Age of Onset  . Diabetes Mother   . Cancer Father   . Cancer - Lung Brother     Social History   Tobacco Use  . Smoking status: Never Smoker  . Smokeless tobacco: Never Used  Vaping Use  . Vaping Use: Never used  Substance Use Topics  . Alcohol use: Not Currently    Alcohol/week: 0.0 standard drinks  . Drug use: No    Home Medications Prior to Admission medications   Medication Sig Start Date End Date Taking? Authorizing  Provider  amLODipine (NORVASC) 5 MG tablet Take 5 mg by mouth in the morning.    Yes [provider]  aspirin 81 MG tablet Take 81 mg by mouth in the morning.    Yes [provider]  benazepril (LOTENSIN) 40 MG tablet Take 40 mg by mouth in the morning.    Yes [provider]  carbidopa-levodopa (SINEMET IR) 25-100 MG tablet 1/2 tablet three times a day for 3 weeks, then take one tablet three times a day Patient taking differently: Take 1 tablet by mouth 3 (three) times daily.  11/15/19  Yes Kathrynn Ducking, MD  chlorthalidone (HYGROTON) 25 MG tablet Take 25 mg by mouth every Monday, Wednesday, and Friday.    Yes [provider]  cholecalciferol (VITAMIN D) 1000 units tablet Take 1,000 Units by mouth in the morning.    Yes [provider]  cyanocobalamin (,VITAMIN B-12,) 1000 MCG/ML injection Inject 1,000 mcg into the muscle every 30 (thirty) days.  08/02/15  Yes [provider]  hydrALAZINE (APRESOLINE) 25 MG tablet Take 25 mg by mouth 3 (three) times daily.  07/03/19  Yes [provider]  memantine (NAMENDA) 10 MG tablet Take 10 mg by mouth in the morning.  08/08/19  Yes [provider]  Polyvinyl Alcohol-Povidone PF (REFRESH) 1.4-0.6 %  SOLN Apply 1 drop to eye daily as needed (FOR DRY EYE RELIEF).    Yes [provider]    Allergies    Patient has no known allergies.  Review of Systems   Review of Systems  Constitutional: Positive for activity change.  Respiratory: Negative for shortness of breath.   Cardiovascular: Negative for chest pain.  Gastrointestinal: Negative for abdominal pain.  Allergic/Immunologic: Negative for immunocompromised state.  Neurological: Positive for weakness.  Hematological: Does not bruise/bleed easily.  All other systems reviewed and are negative.   Physical Exam Updated Vital Signs BP (!) 125/96   Pulse 65   Temp 98.3 F (36.8 C) (Oral)   Resp 13   Ht 5\' 11"  (1.803 m)    Wt 65.3 kg   SpO2 98%   BMI 20.08 kg/m   Physical Exam Vitals and nursing note reviewed.  Constitutional:      Appearance: He is well-developed.  HENT:     Head: Atraumatic.  Cardiovascular:     Rate and Rhythm: Normal rate.  Pulmonary:     Effort: Pulmonary effort is normal.  Musculoskeletal:     Cervical back: Neck supple.     Comments: Head to toe evaluation shows no hematoma, bleeding of the scalp, no facial abrasions, no spine step offs, crepitus of the chest or neck, no tenderness to palpation of the bilateral upper and lower extremities, no gross deformities, no chest tenderness, no pelvic pain.   Skin:    General: Skin is warm.  Neurological:     Mental Status: He is alert and oriented to person, place, and time.     ED Results / Procedures / Treatments   Labs (all labs ordered are listed, but only abnormal results are displayed) Labs Reviewed  COMPREHENSIVE METABOLIC PANEL - Abnormal; Notable for the following components:      Result Value   Sodium 122 (*)    Chloride 87 (*)    Glucose, Bld 125 (*)    GFR calc non Af Amer 56 (*)    All other components within normal limits  CBC WITH DIFFERENTIAL/PLATELET - Abnormal; Notable for the following components:   RBC 3.89 (*)    Hemoglobin 12.9 (*)    HCT 37.7 (*)    Lymphs Abs 0.5 (*)    All other components within normal limits  URINALYSIS, COMPLETE (UACMP) WITH MICROSCOPIC - Abnormal; Notable for the following components:   Ketones, ur 5 (*)    All other components within normal limits  OSMOLALITY - Abnormal; Notable for the following components:   Osmolality 261 (*)    All other components within normal limits  BASIC METABOLIC PANEL - Abnormal; Notable for the following components:   Sodium 125 (*)    Chloride 93 (*)    Glucose, Bld 106 (*)    Calcium 8.4 (*)    All other components within normal limits  BASIC METABOLIC PANEL - Abnormal; Notable for the following components:   Sodium 128 (*)    Chloride  95 (*)    Glucose, Bld 101 (*)    All other components within normal limits  CBG MONITORING, ED - Abnormal; Notable for the following components:   Glucose-Capillary 122 (*)    All other components within normal limits  SARS CORONAVIRUS 2 BY RT PCR (HOSPITAL ORDER, [place] [facility])  MRSA PCR SCREENING  PROTIME-INR  APTT  RAPID URINE DRUG SCREEN, HOSP PERFORMED  SODIUM, URINE, RANDOM  OSMOLALITY, URINE  TSH  MAGNESIUM    EKG EKG Interpretation  Date/Time:  Thursday February 15 2020 19:54:43 EDT Ventricular Rate:  100 PR Interval:    QRS Duration: 130 QT Interval:  366 QTC Calculation: 473 R Axis:   104 Text Interpretation: Atrial fibrillation Nonspecific intraventricular conduction delay ST elevation, consider inferior injury When compared with ECG of 12/08/2019. No significant change was found Confirmed by Delora Fuel ([id number]) on 02/16/2020 12:31:27 AM   Radiology CT HEAD WO CONTRAST  Result Date: 02/16/2020 CLINICAL DATA:  Follow-up subdural EXAM: CT HEAD WITHOUT CONTRAST TECHNIQUE: Contiguous axial images were obtained from the base of the skull through the vertex without intravenous contrast. COMPARISON:  February 15, 2020 FINDINGS: Brain: Revisualization of a LEFT para falcine subdural hematoma. It measures approximately 7 mm in thickness, similar in comparison to prior. There is trace subarachnoid hemorrhage overlying the LEFT frontal vertex, unchanged in comparison to prior. No midline shift. Unchanged size and configuration of the ventricular system. No new hemorrhage. Vascular: Vascular calcifications. Skull: Normal. Negative for fracture or focal lesion. Sinuses/Orbits: Status post cataract surgery. Other: None. IMPRESSION: Similar appearance of LEFT parafalcine subdural hematoma and trace LEFT frontal subarachnoid hemorrhage. Electronically Signed   By: Valentino Saxon MD   On: 02/16/2020 09:43   CT Head Wo Contrast  Result Date: 02/15/2020 CLINICAL DATA:   Altered mental status after fall. EXAM: CT HEAD WITHOUT CONTRAST TECHNIQUE: Contiguous axial images were obtained from the base of the skull through the vertex without intravenous contrast. COMPARISON:  Dec 08, 2019. FINDINGS: Brain: Mild chronic ischemic white matter disease is noted. Small left parafalcine subdural hematoma is noted posteriorly. No mass effect or midline shift is noted. No acute infarction or mass lesion is noted. Ventricular size is within normal limits. Vascular: No hyperdense vessel or unexpected calcification. Skull: Normal. Negative for fracture or focal lesion. Sinuses/Orbits: No acute finding. Other: Small right frontal scalp hematoma is noted. IMPRESSION: Small left parafalcine subdural hematoma is noted posteriorly. Small right frontal scalp hematoma is noted. Critical Value/emergent results were called by telephone at the time of interpretation on 02/15/2020 at 8:31 pm to provider [facility] , who verbally acknowledged these results. Electronically Signed   By: Marijo Conception M.D.   On: 02/15/2020 20:32    Procedures .Critical Care Performed by: Varney Biles, MD Authorized by: Varney Biles, MD   Critical care provider statement:    Critical care time (minutes):  32   Critical care was necessary to treat or prevent imminent or life-threatening deterioration of the following conditions:  CNS failure or compromise and metabolic crisis   Critical care was time spent personally by me on the following activities:  Discussions with consultants, evaluation of patient's response to treatment, examination of patient, ordering and performing treatments and interventions, ordering and review of laboratory studies, ordering and review of radiographic studies, pulse oximetry, re-evaluation of patient's condition, obtaining history from patient or surrogate and review of old charts   (including critical care time)  Medications Ordered in ED Medications  acetaminophen  (TYLENOL) tablet 650 mg (has no administration in time range)    Or  acetaminophen (TYLENOL) suppository 650 mg (has no administration in time range)  ondansetron (ZOFRAN) tablet 4 mg (has no administration in time range)    Or  ondansetron (ZOFRAN) injection 4 mg (has no administration in time range)  labetalol (NORMODYNE) injection 5 mg (has no administration in time range)  levETIRAcetam (KEPPRA) IVPB 500 mg/100 mL premix (500 mg Intravenous New Bag/Given 02/16/20  2117)  dextrose 5 %-0.9 % sodium chloride infusion ( Intravenous New Bag/Given 02/16/20 2303)  Chlorhexidine Gluconate Cloth 2 % PADS 6 each (6 each Topical Given 02/16/20 1105)  amLODipine (NORVASC) tablet 5 mg (5 mg Oral Not Given 02/16/20 1345)  benazepril (LOTENSIN) tablet 20 mg (20 mg Oral Not Given 02/16/20 1345)  hydrALAZINE (APRESOLINE) tablet 25 mg (25 mg Oral Given 02/16/20 2116)  sodium chloride 0.9 % bolus 500 mL (0 mLs Intravenous Stopped 02/16/20 0014)    ED Course  I have reviewed the triage vital signs and the nursing notes.  Pertinent labs & imaging results that were available during my care of the patient were reviewed by me and considered in my medical decision making (see chart for details).    MDM Rules/Calculators/A&P                           Pt comes in with cc of fall. CT head shows small SDH.  Spoke with neurosurgery.  They do not think patient needs to come to [facility].  They would want a repeat CAT scan.  Ultimately it appears that he had similar finding in his CT scan in May.  Patient's sodium is low.  That could be contributing to his mental status changes and also his fall.  We will admit him to the hospital for hyponatremia.  Final Clinical Impression(s) / ED Diagnoses Final diagnoses:  Acute hyponatremia  SDH (subdural hematoma) ([place])    Rx / DC Orders ED Discharge Orders    None       Varney Biles, MD 02/17/20 0001

## 2020-02-16 NOTE — TOC Progression Note (Signed)
Transition of Care [facility]) - Progression Note   Patient Details  Name: Luis Freeman MRN: [id number] Date of Birth: 07-06-1933  Transition of Care [facility]) CM/SW [place], LCSW Phone Number: 02/16/2020, 1:36 PM  Clinical Narrative: Clinicals faxed to [facility] (reference # [id number]). Awaiting insurance authorization. CSW spoke with Mardene Celeste with [facility] and was informed they could not offer the patient a bed at this time. Family to review other bed offers.  Expected Discharge Plan: [place] Barriers to Discharge: Continued Medical Work up, Ship broker  Expected Discharge Plan and Services Expected Discharge Plan: [place] In-house Referral: Clinical Social Work Post Acute Care Choice: [place] Living arrangements for the past 2 months: Single Family Home Expected Discharge Date: 02/16/20              Readmission Risk Interventions No flowsheet data found.

## 2020-02-16 NOTE — NC FL2 (Signed)
[place] LEVEL OF CARE SCREENING TOOL     IDENTIFICATION  Patient Name: Luis Freeman Birthdate: 1932/11/08 Sex: male Admission Date (Current Location): 02/15/2020  [facility] and [place] Number:  [employer] and Address:  [place] [address] [place]      Provider Number: [id number]  Attending Physician Name and Address:  Roxan Hockey, MD  Relative Name and Phone Number:       Current Level of Care: Hospital Recommended Level of Care: [place] Prior Approval Number:    Date Approved/Denied:   PASRR Number: [id number]  Discharge Plan: SNF    Current Diagnoses: Patient Active Problem List   Diagnosis Date Noted  . SDH (subdural hematoma) ([place]) 02/15/2020  . PSP (progressive supranuclear palsy) ([place]) 11/15/2019  . Gait abnormality 11/15/2019  . Movement disorder 05/18/2019  . Dementia ([place]) 11/10/2018    Orientation RESPIRATION BLADDER Height & Weight     Self, Place  Normal External catheter Weight: 143 lb 15.4 oz (65.3 kg) Height:  5\' 11"  (180.3 cm)  BEHAVIORAL SYMPTOMS/MOOD NEUROLOGICAL BOWEL NUTRITION STATUS      Incontinent Diet (Dysphagia 3 with thin liquids. See d/c summary for updates.)  AMBULATORY STATUS COMMUNICATION OF NEEDS Skin   Extensive Assist Verbally Normal                       Personal Care Assistance Level of Assistance  Bathing, Feeding, Dressing Bathing Assistance: Maximum assistance Feeding assistance: Limited assistance Dressing Assistance: Maximum assistance     Functional Limitations Info  Sight, Hearing, Speech Sight Info: Adequate Hearing Info: Impaired Speech Info: Adequate    SPECIAL CARE FACTORS FREQUENCY  PT (By licensed PT), OT (By licensed OT)     PT Frequency: daily              Contractures      Additional Factors Info  Code Status, Allergies   Allergies Info: No known allergies           Current Medications  (02/16/2020):  This is the current hospital active medication list Current Facility-Administered Medications  Medication Dose Route Frequency Provider Last Rate Last Admin  . acetaminophen (TYLENOL) tablet 650 mg  650 mg Oral Q6H PRN Emokpae, Ejiroghene E, MD       Or  . acetaminophen (TYLENOL) suppository 650 mg  650 mg Rectal Q6H PRN Emokpae, Ejiroghene E, MD      . Chlorhexidine Gluconate Cloth 2 % PADS 6 each  6 each Topical Daily Roxan Hockey, MD   6 each at 02/16/20 1105  . dextrose 5 %-0.9 % sodium chloride infusion   Intravenous Continuous Emokpae, Ejiroghene E, MD 75 mL/hr at 02/16/20 0644 New Bag at 02/16/20 0644  . labetalol (NORMODYNE) injection 5 mg  5 mg Intravenous Q2H PRN Emokpae, Ejiroghene E, MD      . levETIRAcetam (KEPPRA) IVPB 500 mg/100 mL premix  500 mg Intravenous Q12H Emokpae, Ejiroghene E, MD 400 mL/hr at 02/16/20 1104 500 mg at 02/16/20 1104  . ondansetron (ZOFRAN) tablet 4 mg  4 mg Oral Q6H PRN Emokpae, Ejiroghene E, MD       Or  . ondansetron (ZOFRAN) injection 4 mg  4 mg Intravenous Q6H PRN Emokpae, Ejiroghene E, MD         Discharge Medications: Please see discharge summary for a list of discharge medications.  Relevant Imaging Results:  Relevant Lab Results:   Additional Information SSN:  [id number]. Pt has had COVID vaccines per daughter. Fall risk.  Salome Arnt, LCSW

## 2020-02-17 DIAGNOSIS — Z801 Family history of malignant neoplasm of trachea, bronchus and lung: Secondary | ICD-10-CM | POA: Diagnosis not present

## 2020-02-17 DIAGNOSIS — Z7401 Bed confinement status: Secondary | ICD-10-CM | POA: Diagnosis not present

## 2020-02-17 DIAGNOSIS — W19XXXA Unspecified fall, initial encounter: Secondary | ICD-10-CM | POA: Diagnosis present

## 2020-02-17 DIAGNOSIS — F0390 Unspecified dementia without behavioral disturbance: Secondary | ICD-10-CM | POA: Diagnosis not present

## 2020-02-17 DIAGNOSIS — E871 Hypo-osmolality and hyponatremia: Secondary | ICD-10-CM | POA: Diagnosis not present

## 2020-02-17 DIAGNOSIS — Z833 Family history of diabetes mellitus: Secondary | ICD-10-CM | POA: Diagnosis not present

## 2020-02-17 DIAGNOSIS — Y92010 Kitchen of single-family (private) house as the place of occurrence of the external cause: Secondary | ICD-10-CM | POA: Diagnosis not present

## 2020-02-17 DIAGNOSIS — I10 Essential (primary) hypertension: Secondary | ICD-10-CM | POA: Diagnosis not present

## 2020-02-17 DIAGNOSIS — G231 Progressive supranuclear ophthalmoplegia [Steele-Richardson-Olszewski]: Secondary | ICD-10-CM | POA: Diagnosis not present

## 2020-02-17 DIAGNOSIS — S065X9A Traumatic subdural hemorrhage with loss of consciousness of unspecified duration, initial encounter: Secondary | ICD-10-CM | POA: Diagnosis not present

## 2020-02-17 DIAGNOSIS — R404 Transient alteration of awareness: Secondary | ICD-10-CM | POA: Diagnosis not present

## 2020-02-17 DIAGNOSIS — G9341 Metabolic encephalopathy: Secondary | ICD-10-CM | POA: Diagnosis not present

## 2020-02-17 DIAGNOSIS — R262 Difficulty in walking, not elsewhere classified: Secondary | ICD-10-CM | POA: Diagnosis present

## 2020-02-17 DIAGNOSIS — Z7982 Long term (current) use of aspirin: Secondary | ICD-10-CM | POA: Diagnosis not present

## 2020-02-17 DIAGNOSIS — Z20822 Contact with and (suspected) exposure to covid-19: Secondary | ICD-10-CM | POA: Diagnosis not present

## 2020-02-17 DIAGNOSIS — E538 Deficiency of other specified B group vitamins: Secondary | ICD-10-CM | POA: Diagnosis not present

## 2020-02-17 DIAGNOSIS — G259 Extrapyramidal and movement disorder, unspecified: Secondary | ICD-10-CM | POA: Diagnosis not present

## 2020-02-17 DIAGNOSIS — Z79899 Other long term (current) drug therapy: Secondary | ICD-10-CM | POA: Diagnosis not present

## 2020-02-17 MED ORDER — LEVETIRACETAM 500 MG PO TABS
500.0000 mg | ORAL_TABLET | Freq: Two times a day (BID) | ORAL | Status: DC
  Administered 2020-02-17 – 2020-02-19 (×5): 500 mg via ORAL
  Filled 2020-02-17 (×5): qty 1

## 2020-02-17 NOTE — Care Management Obs Status (Signed)
[place] NOTIFICATION   Patient Details  Name: Luis Freeman MRN: [id number] Date of Birth: 05-07-1933   Medicare Observation Status Notification Given:  Yes    Natasha Bence, LCSW 02/17/2020, 2:10 PM

## 2020-02-17 NOTE — Progress Notes (Signed)
Physical Therapy Treatment Patient Details Name: Luis Freeman MRN: [id number] DOB: [birth year] Today's Date: 02/17/2020    History of Present Illness Luis Freeman is a 86 y.o. male with medical history significant for dementia, progressive supranuclear palsy, gait abnormality.Patient was brought to the ED with reports of a fall today.  Return for evaluation, patient is confused, unable to give me history, history is obtained from patient daughter- [name], who is present at bedside, she was not present when patient fell.  Patient spouse witnessed the fall.  Patient was standing with his spouse in the kitchen making a sandwich when suddenly he said he was very stiff and he could not move, he attempted to move and fell.  Falls in the past week.  He has a history of abnormal gait.  He ambulates with a walker at baseline.  As regards his dementia, he sometimes interacts with family, but daughter reports a delay in understanding when he is spoken to.  Otherwise he can carry out some activities of daily living.No vomiting, no loose stools.  He has maintained good oral intake.    PT Comments    Patient demonstrates labored movement for sitting up at bedside, unsteady on feet and at severe risk for falls when making turns using RW requiring verbal/tactile cueing to avoid loss of balance due to not leaning on walker for support.  Patient tolerated sitting up in chair after therapy with his daughter present in room.  Patient will benefit from continued physical therapy in hospital and recommended venue below to increase strength, balance, endurance for safe ADLs and gait.    Follow Up Recommendations  SNF;Supervision for mobility/OOB;Supervision/Assistance - 24 hour     Equipment Recommendations  None recommended by PT    Recommendations for Other Services       Precautions / Restrictions Precautions Precautions: Fall    Mobility  Bed Mobility Overal bed mobility: Needs Assistance Bed  Mobility: Sit to Supine       Sit to supine: Min guard;Min assist   General bed mobility comments: increased time, labored movement  Transfers Overall transfer level: Needs assistance Equipment used: Rolling walker (2 wheeled) Transfers: Sit to/from [facility] Sit to Stand: Min assist Stand pivot transfers: Min assist       General transfer comment: unsteady on feet, repeated verbal/tactile cueing to follow instructions with fair carryover  Ambulation/Gait Ambulation/Gait assistance: Min assist;Mod assist Gait Distance (Feet): 40 Feet Assistive device: Rolling walker (2 wheeled) Gait Pattern/deviations: Decreased step length - right;Decreased step length - left;Decreased stride length Gait velocity: decreased   General Gait Details: slow labored unsteady cadence with near loss of balance when making turns due to fair/poor carryover using RW requiring verbal/tactile cueing to avoid falling   Stairs             Wheelchair Mobility    Modified Rankin (Stroke Patients Only)       Balance Overall balance assessment: Needs assistance Sitting-balance support: Feet supported;No upper extremity supported Sitting balance-Leahy Scale: Fair Sitting balance - Comments: fair/good seated at EOB   Standing balance support: During functional activity;Bilateral upper extremity supported Standing balance-Leahy Scale: Fair Standing balance comment: using RW                            Cognition Arousal/Alertness: Awake/alert Behavior During Therapy: WFL for tasks assessed/performed Overall Cognitive Status: History of cognitive impairments - at baseline  General Comments: requires occasional repeated verbal/tactile cueing with fair carryover      Exercises General Exercises - Lower Extremity Ankle Circles/Pumps: AROM;10 reps;Seated;Strengthening;Both Long Arc Quad: Seated;AROM;Strengthening;10  reps;Both Hip Flexion/Marching: Seated;AROM;Strengthening;Both;5 reps    General Comments        Pertinent Vitals/Pain Pain Assessment: No/denies pain    Home Living                      Prior Function            PT Goals (current goals can now be found in the care plan section) Acute Rehab PT Goals Patient Stated Goal: return home with family to assist PT Goal Formulation: With patient/family Time For Goal Achievement: 03/01/20 Potential to Achieve Goals: Good Progress towards PT goals: Progressing toward goals    Frequency    Min 3X/week      PT Plan Current plan remains appropriate    Co-evaluation              AM-PAC PT "6 Clicks" Mobility   Outcome Measure  Help needed turning from your back to your side while in a flat bed without using bedrails?: None Help needed moving from lying on your back to sitting on the side of a flat bed without using bedrails?: A Little Help needed moving to and from a bed to a chair (including a wheelchair)?: A Little Help needed standing up from a chair using your arms (e.g., wheelchair or bedside chair)?: A Little Help needed to walk in hospital room?: A Lot Help needed climbing 3-5 steps with a railing? : A Lot 6 Click Score: 17    End of Session   Activity Tolerance: Patient tolerated treatment well;Patient limited by fatigue Patient left: in chair;with call bell/phone within reach;with chair alarm set;with family/visitor present Nurse Communication: Mobility status PT Visit Diagnosis: Unsteadiness on feet (R26.81);Other abnormalities of gait and mobility (R26.89);Muscle weakness (generalized) (M62.81)     Time: 1696-7893 PT Time Calculation (min) (ACUTE ONLY): 23 min  Charges:  $Gait Training: 8-22 mins $Therapeutic Exercise: 8-22 mins                     12:57 PM, 02/17/20 Lonell Grandchild, MPT Physical Therapist with [facility] [phone] office [phone] mobile phone

## 2020-02-17 NOTE — Progress Notes (Signed)
Fed self all of supper.

## 2020-02-17 NOTE — Progress Notes (Signed)
Patient Demographics:    Luis Freeman, is a 86 y.o. male, DOB - 02/22/1933, OIZ:[id number]  Admit date - 02/15/2020   Admitting Physician Ejiroghene Arlyce Dice, MD  Outpatient Primary MD for the patient is [name], MD  LOS - 0   Chief Complaint  Patient presents with  . Fall  . Altered Mental Status        Subjective:    Luis Freeman today has no fevers, no emesis,  No chest pain,   --Gait concerns persist,  -Patient with poor safety awareness and high risk for falling -[name] at bedside  --  Assessment  & Plan :    Principal Problem:   SDH (subdural hematoma) (HCC) Active Problems:   PSP (progressive supranuclear palsy) (HCC)   Gait abnormality   Movement disorder   HTN (hypertension)  Brief Summary:- -86 year old with past medical history relevant for progressive supranuclear palsy with significant gait and speech problems, HTN and history of B12 deficiency admitted on 02/15/2020 after a fall with traumatic subdural hemorrhage -Repeat CT head on 02/16/2020 showed stable subdural hemorrhage on the left ---Gait concerns persist,  -Patient with poor safety awareness and high risk for falling  A/p 1)Status post fall with traumatic Lt SDH--  Repeat CT head on 02/16/20 stable from neurological standpoint -No additional new focal neurological deficits -Continue to monitor closely -Avoid antiplatelets and anticoagulants -Fall precautions ---Gait concerns persist,  -Patient with poor safety awareness and high risk for falling  2)Ambulatory dysfunction/unsteady gait/recurrent falls----PT eval appreciated recommends SNF rehab  PT evaluations performed. SNF Rehab recommended. SNF appropriate as  the 3 day period has been waived by the patient's insurance company) and is felt to need rehab services to restore this patient to their prior level of function to achieve safe transition back to  home care. This patient needs rehab services for at least 5 days per week and skilled nursing services daily to facilitate this transition. Rehab is being requested as the most appropriate d/c option for this patient and is NOT felt to be for custodial care as evidenced by previously independent with ADLs including not limited to grooming, toileting, cooking and other mobility related ADLs prior to admission --PTA patient lives with his elderly wife and with limited support from time to time from family members --Patient is now more unsteady more at risk of falling and requires SNF rehab to return to his pre- admission level of functioning ---Gait concerns persist,  -Patient with poor safety awareness and high risk for falling  3) progressive supranuclear palsy--- with significant gait, balance and ambulatory concerns as well as speech concerns --Continue Sinemet  4)HTN--- stopped chlorthalidone due to electrolyte concerns, continue amlodipine 5 mg daily and benazepril can be reduced to 20 mg daily -Continue hydralazine at 25 mg twice daily  5) B12 deficiency----patient gets monthly B12 shots  Disposition/Need for in-Hospital Stay- patient unable to be discharged at this time due to unsafe discharge plan, patient admitted with fall and closed head injury with traumatic scalp dural hemorrhage--- needs SNF rehab to avoid further falls--please see problem #2 above ---Gait concerns persist,  -Patient with poor safety awareness and high risk for falling  Status is: obs   Disposition: The patient is from: Home  Anticipated d/c is to: Home              Anticipated d/c date is: 1 day              Patient currently is medically stable to d/c. Barriers: Not Clinically Stable- - unsafe discharge plan, patient admitted with fall and closed head injury with traumatic scalp dural hemorrhage--- needs SNF rehab to avoid further falls--please see problem #2 above  Code Status : Full  Family  Communication:    (patient is alert, awake and coherent) --- --Discussed with Daughter [name] present at bedside  Consults  : NeuroSurgeon  DVT Prophylaxis  :    - SCDs  Lab Results  Component Value Date   PLT 284 02/15/2020    Inpatient Medications  Scheduled Meds: . amLODipine  5 mg Oral Daily  . benazepril  20 mg Oral Daily  . Chlorhexidine Gluconate Cloth  6 each Topical Daily  . hydrALAZINE  25 mg Oral BID  . levETIRAcetam  500 mg Oral BID   Continuous Infusions: . dextrose 5 % and 0.9% NaCl 75 mL/hr at 02/17/20 1158   PRN Meds:.acetaminophen **OR** acetaminophen, labetalol, ondansetron **OR** ondansetron (ZOFRAN) IV    Anti-infectives (From admission, onward)   None        Objective:   Vitals:   02/17/20 0045 02/17/20 0406 02/17/20 0658 02/17/20 0929  BP: (!) 151/90 (!) 145/80 (!) 133/86 (!) 154/81  Pulse: 58 65  67  Resp: 18 16  16   Temp:  97.8 F (36.6 C)  98.1 F (36.7 C)  TempSrc:  Oral  Oral  SpO2: 99% 100%  100%  Weight:      Height:        Wt Readings from Last 3 Encounters:  02/16/20 65.3 kg  12/08/19 70.3 kg  11/15/19 71.7 kg     Intake/Output Summary (Last 24 hours) at 02/17/2020 1248 Last data filed at 02/17/2020 0848 Gross per 24 hour  Intake 240 ml  Output 1300 ml  Net -1060 ml     Physical Exam  Gen:- Awake Alert,  In no apparent distress  HEENT:- [place].AT, No sclera icterus Ears-HOH Neck-Supple Neck,No JVD,.  Lungs-  CTAB , fair symmetrical air movement CV- S1, S2 normal, regular  Abd-  +ve B.Sounds, Abd Soft, No tenderness,    Extremity/Skin:- No  edema, pedal pulses present  Psych-affect is appropriate, oriented x3 Neuro-generalized weakness, unsteady gait no new focal deficits, no tremors   Data Review:   Micro Results Recent Results (from the past 240 hour(s))  SARS Coronavirus 2 by RT PCR (hospital order, performed in [facility] hospital lab) Nasopharyngeal Nasopharyngeal Swab     Status: None   Collection Time:  02/15/20  9:07 PM   Specimen: Nasopharyngeal Swab  Result Value Ref Range Status   SARS Coronavirus 2 NEGATIVE NEGATIVE Final    Comment: (NOTE) SARS-CoV-2 target nucleic acids are NOT DETECTED.  The SARS-CoV-2 RNA is generally detectable in upper and lower respiratory specimens during the acute phase of infection. The lowest concentration of SARS-CoV-2 viral copies this assay can detect is 250 copies / mL. A negative result does not preclude SARS-CoV-2 infection and should not be used as the sole basis for treatment or other patient management decisions.  A negative result may occur with improper specimen collection / handling, submission of specimen other than nasopharyngeal swab, presence of viral mutation(s) within the areas targeted by this assay, and inadequate number of viral copies (<250 copies /  mL). A negative result must be combined with clinical observations, patient history, and epidemiological information.  Fact Sheet for Patients:   StrictlyIdeas.no  Fact Sheet for Healthcare Providers: BankingDealers.co.za  This test is not yet approved or  cleared by the [place] FDA and has been authorized for detection and/or diagnosis of SARS-CoV-2 by FDA under an Emergency Use Authorization (EUA).  This EUA will remain in effect (meaning this test can be used) for the duration of the COVID-19 declaration under Section 564(b)(1) of the Act, 21 U.S.C. section 360bbb-3(b)(1), unless the authorization is terminated or revoked sooner.  Performed at [facility], [address] [place], [place] [address]   MRSA PCR Screening     Status: None   Collection Time: 02/16/20  6:20 AM   Specimen: Nasal Mucosa; Nasopharyngeal  Result Value Ref Range Status   MRSA by PCR NEGATIVE NEGATIVE Final    Comment:        The GeneXpert MRSA Assay (FDA approved for NASAL specimens only), is one component of a comprehensive MRSA colonization  surveillance program. It is not intended to diagnose MRSA infection nor to guide or monitor treatment for MRSA infections. Performed at [facility], [address] [place], [place] [address]     Radiology Reports CT HEAD WO CONTRAST  Result Date: 02/16/2020 CLINICAL DATA:  Follow-up subdural EXAM: CT HEAD WITHOUT CONTRAST TECHNIQUE: Contiguous axial images were obtained from the base of the skull through the vertex without intravenous contrast. COMPARISON:  February 15, 2020 FINDINGS: Brain: Revisualization of a LEFT para falcine subdural hematoma. It measures approximately 7 mm in thickness, similar in comparison to prior. There is trace subarachnoid hemorrhage overlying the LEFT frontal vertex, unchanged in comparison to prior. No midline shift. Unchanged size and configuration of the ventricular system. No new hemorrhage. Vascular: Vascular calcifications. Skull: Normal. Negative for fracture or focal lesion. Sinuses/Orbits: Status post cataract surgery. Other: None. IMPRESSION: Similar appearance of LEFT parafalcine subdural hematoma and trace LEFT frontal subarachnoid hemorrhage. Electronically Signed   By: Valentino Saxon MD   On: 02/16/2020 09:43   CT Head Wo Contrast  Result Date: 02/15/2020 CLINICAL DATA:  Altered mental status after fall. EXAM: CT HEAD WITHOUT CONTRAST TECHNIQUE: Contiguous axial images were obtained from the base of the skull through the vertex without intravenous contrast. COMPARISON:  Dec 08, 2019. FINDINGS: Brain: Mild chronic ischemic white matter disease is noted. Small left parafalcine subdural hematoma is noted posteriorly. No mass effect or midline shift is noted. No acute infarction or mass lesion is noted. Ventricular size is within normal limits. Vascular: No hyperdense vessel or unexpected calcification. Skull: Normal. Negative for fracture or focal lesion. Sinuses/Orbits: No acute finding. Other: Small right frontal scalp hematoma is noted. IMPRESSION: Small  left parafalcine subdural hematoma is noted posteriorly. Small right frontal scalp hematoma is noted. Critical Value/emergent results were called by telephone at the time of interpretation on 02/15/2020 at 8:31 pm to provider [facility] , who verbally acknowledged these results. Electronically Signed   By: Marijo Conception M.D.   On: 02/15/2020 20:32     CBC Recent Labs  Lab 02/15/20 2002  WBC 5.5  HGB 12.9*  HCT 37.7*  PLT 284  MCV 96.9  MCH 33.2  MCHC 34.2  RDW 12.0  LYMPHSABS 0.5*  MONOABS 0.6  EOSABS 0.1  BASOSABS 0.0    Chemistries  Recent Labs  Lab 02/15/20 2002 02/16/20 0208 02/16/20 0549  NA 122* 125* 128*  K 4.1 3.6 4.0  CL 87* 93* 95*  CO2 24 25 27   GLUCOSE 125* 106* 101*  BUN 21 17 14   CREATININE 1.18 0.87 0.85  CALCIUM 9.1 8.4* 8.9  MG 1.9  --   --   AST 19  --   --   ALT 9  --   --   ALKPHOS 56  --   --   BILITOT 0.8  --   --    ------------------------------------------------------------------------------------------------------------------ No results for input(s): CHOL, HDL, LDLCALC, TRIG, CHOLHDL, LDLDIRECT in the last 72 hours.  No results found for: HGBA1C ------------------------------------------------------------------------------------------------------------------ Recent Labs    02/15/20 2002  TSH 3.583   ------------------------------------------------------------------------------------------------------------------ No results for input(s): VITAMINB12, FOLATE, FERRITIN, TIBC, IRON, RETICCTPCT in the last 72 hours.  Coagulation profile Recent Labs  Lab 02/15/20 2002  INR 1.0    No results for input(s): DDIMER in the last 72 hours.  Cardiac Enzymes No results for input(s): CKMB, TROPONINI, MYOGLOBIN in the last 168 hours.  Invalid input(s): CK ------------------------------------------------------------------------------------------------------------------ No results found for: BNP   Roxan Hockey M.D on 02/17/2020 at  12:48 PM  Go to [email or web address] - for contact info  [facility] - Office  [phone]

## 2020-02-17 NOTE — Progress Notes (Addendum)
Alert and oriented x 4 and walked with walker and standby to bathroom.  Quick to try and get up without assistance, bed alarm set and daughter in room. Poor safety awareness.  Swallowed pills with no difficulty.

## 2020-02-18 LAB — CBC
HCT: 41.3 % (ref 39.0–52.0)
Hemoglobin: 14 g/dL (ref 13.0–17.0)
MCH: 33.8 pg (ref 26.0–34.0)
MCHC: 33.9 g/dL (ref 30.0–36.0)
MCV: 99.8 fL (ref 80.0–100.0)
Platelets: 229 10*3/uL (ref 150–400)
RBC: 4.14 MIL/uL — ABNORMAL LOW (ref 4.22–5.81)
RDW: 12 % (ref 11.5–15.5)
WBC: 5.9 10*3/uL (ref 4.0–10.5)
nRBC: 0 % (ref 0.0–0.2)

## 2020-02-18 LAB — BASIC METABOLIC PANEL
Anion gap: 10 (ref 5–15)
BUN: 10 mg/dL (ref 8–23)
CO2: 25 mmol/L (ref 22–32)
Calcium: 9 mg/dL (ref 8.9–10.3)
Chloride: 97 mmol/L — ABNORMAL LOW (ref 98–111)
Creatinine, Ser: 0.72 mg/dL (ref 0.61–1.24)
GFR calc Af Amer: >60 mL/min (ref >60)
GFR calc non Af Amer: >60 mL/min (ref >60)
Glucose, Bld: 102 mg/dL — ABNORMAL HIGH (ref 70–99)
Potassium: 3.5 mmol/L (ref 3.5–5.1)
Sodium: 132 mmol/L — ABNORMAL LOW (ref 135–145)

## 2020-02-18 MED ORDER — AMLODIPINE BESYLATE 5 MG PO TABS
10.0000 mg | ORAL_TABLET | Freq: Every day | ORAL | Status: DC
  Administered 2020-02-19: 10 mg via ORAL
  Filled 2020-02-18: qty 2

## 2020-02-18 MED ORDER — AMLODIPINE BESYLATE 5 MG PO TABS: 2.5000 mg | ORAL_TABLET | Freq: Once | ORAL | Status: DC

## 2020-02-18 NOTE — Progress Notes (Signed)
Patient Demographics:    Luis Freeman, is a 86 y.o. male, DOB - [birth year], YDX:[id number]  Admit date - 02/15/2020   Admitting Physician Titilayo Hagans Denton Brick, MD  Outpatient Primary MD for the patient is [name], MD  LOS - 1   Chief Complaint  Patient presents with  . Fall  . Altered Mental Status        Subjective:    Luis Freeman today has no fevers, no emesis,  No chest pain,   --Gait concerns persist,  -Patient with poor safety awareness and high risk for falling -[name] at bedside -Patient able to feed himself -Voiding okay and had BM  --  Assessment  & Plan :    Principal Problem:   SDH (subdural hematoma) (HCC) Active Problems:   PSP (progressive supranuclear palsy) (HCC)   Gait abnormality   Movement disorder   HTN (hypertension)  Brief Summary:- -86 year old with past medical history relevant for progressive supranuclear palsy with significant gait and speech problems, HTN and history of B12 deficiency admitted on 02/15/2020 after a fall with traumatic subdural hemorrhage -Repeat CT head on 02/16/2020 showed stable subdural hemorrhage on the left ---Gait concerns persist,  -Patient with poor safety awareness and high risk for falling  A/p 1)Status post fall with traumatic Lt SDH--  Repeat CT head on 02/16/20 stable from neurological standpoint -No additional new focal neurological deficits -Continue to monitor closely -Avoid antiplatelets and anticoagulants -Fall precautions ---Gait concerns persist,  -Patient with poor safety awareness and high risk for falling  2)Ambulatory dysfunction/unsteady gait/recurrent falls----PT eval appreciated recommends SNF rehab  PT evaluations performed. SNF Rehab recommended. SNF appropriate as  the 3 day period has been waived by the patient's insurance company) and is felt to need rehab services to restore this patient to their prior  level of function to achieve safe transition back to home care. This patient needs rehab services for at least 5 days per week and skilled nursing services daily to facilitate this transition. Rehab is being requested as the most appropriate d/c option for this patient and is NOT felt to be for custodial care as evidenced by previously independent with ADLs including not limited to grooming, toileting, cooking and other mobility related ADLs prior to admission --PTA patient lives with his elderly wife and with limited support from time to time from family members --Patient is now more unsteady more at risk of falling and requires SNF rehab to return to his pre- admission level of functioning ---Gait concerns persist,  -Patient with poor safety awareness and high risk for falling  3)Progressive supranuclear palsy--- with significant gait, balance and ambulatory concerns as well as speech concerns --Continue Sinemet  4)HTN--- stopped chlorthalidone due to electrolyte concerns, okay to increase amlodipine to 10 mg daily for better BP control given ICH -Continue benazepril -Continue hydralazine at 25 mg twice daily  5) B12 deficiency----patient gets monthly B12 shots  Disposition/Need for in-Hospital Stay- patient unable to be discharged at this time due to unsafe discharge plan, patient admitted with fall and closed head injury with traumatic scalp dural hemorrhage--- needs SNF rehab to avoid further falls--please see problem #2 above ---Gait concerns persist,  -Patient with poor safety awareness and high risk for falling  Status  is: obs   Disposition: The patient is from: Home              Anticipated d/c is to: Home              Anticipated d/c date is: 1 day              Patient currently is medically stable to d/c. Barriers: Not Clinically Stable- - unsafe discharge plan, patient admitted with fall and closed head injury with traumatic scalp dural hemorrhage--- needs SNF rehab to avoid  further falls--please see problem #2 above  Code Status : Full  Family Communication:    (patient is alert, awake and coherent) --- --Discussed with [name] including daughter [name]   Consults  : NeuroSurgeon  DVT Prophylaxis  :    - SCDs  Lab Results  Component Value Date   PLT 229 02/18/2020    Inpatient Medications  Scheduled Meds: . [START ON 02/19/2020] amLODipine  10 mg Oral Daily  . amLODipine  2.5 mg Oral Once  . benazepril  20 mg Oral Daily  . Chlorhexidine Gluconate Cloth  6 each Topical Daily  . hydrALAZINE  25 mg Oral BID  . levETIRAcetam  500 mg Oral BID   Continuous Infusions:  PRN Meds:.acetaminophen **OR** acetaminophen, labetalol, ondansetron **OR** ondansetron (ZOFRAN) IV    Anti-infectives (From admission, onward)   None        Objective:   Vitals:   02/17/20 1323 02/17/20 2038 02/18/20 0459 02/18/20 1427  BP: (!) 117/55 (!) 154/80 (!) 147/82 116/77  Pulse: 64 68 73 76  Resp: 16 17 16 16   Temp: 97.8 F (36.6 C) 98.5 F (36.9 C) 98.1 F (36.7 C) 98.4 F (36.9 C)  TempSrc: Oral  Oral Oral  SpO2: 97% 98% 97% 99%  Weight:      Height:        Wt Readings from Last 3 Encounters:  02/16/20 65.3 kg  12/08/19 70.3 kg  11/15/19 71.7 kg     Intake/Output Summary (Last 24 hours) at 02/18/2020 1649 Last data filed at 02/18/2020 0900 Gross per 24 hour  Intake 2843.71 ml  Output 3250 ml  Net -406.29 ml     Physical Exam  Gen:- Awake Alert,  In no apparent distress  HEENT:- [place].AT, No sclera icterus Ears-HOH Neck-Supple Neck,No JVD,.  Lungs-  CTAB , fair symmetrical air movement CV- S1, S2 normal, regular  Abd-  +ve B.Sounds, Abd Soft, No tenderness,    Extremity/Skin:- No  edema, pedal pulses present  Psych-affect is appropriate, oriented x3 Neuro-generalized weakness, unsteady gait , no new focal deficits, no tremors   Data Review:   Micro Results Recent Results (from the past 240 hour(s))  SARS Coronavirus 2 by RT PCR  (hospital order, performed in [facility] hospital lab) Nasopharyngeal Nasopharyngeal Swab     Status: None   Collection Time: 02/15/20  9:07 PM   Specimen: Nasopharyngeal Swab  Result Value Ref Range Status   SARS Coronavirus 2 NEGATIVE NEGATIVE Final    Comment: (NOTE) SARS-CoV-2 target nucleic acids are NOT DETECTED.  The SARS-CoV-2 RNA is generally detectable in upper and lower respiratory specimens during the acute phase of infection. The lowest concentration of SARS-CoV-2 viral copies this assay can detect is 250 copies / mL. A negative result does not preclude SARS-CoV-2 infection and should not be used as the sole basis for treatment or other patient management decisions.  A negative result may occur with improper specimen  collection / handling, submission of specimen other than nasopharyngeal swab, presence of viral mutation(s) within the areas targeted by this assay, and inadequate number of viral copies (<250 copies / mL). A negative result must be combined with clinical observations, patient history, and epidemiological information.  Fact Sheet for Patients:   StrictlyIdeas.no  Fact Sheet for Healthcare Providers: BankingDealers.co.za  This test is not yet approved or  cleared by the [place] FDA and has been authorized for detection and/or diagnosis of SARS-CoV-2 by FDA under an Emergency Use Authorization (EUA).  This EUA will remain in effect (meaning this test can be used) for the duration of the COVID-19 declaration under Section 564(b)(1) of the Act, 21 U.S.C. section 360bbb-3(b)(1), unless the authorization is terminated or revoked sooner.  Performed at [facility], [address] [place], [place] [address]   MRSA PCR Screening     Status: None   Collection Time: 02/16/20  6:20 AM   Specimen: Nasal Mucosa; Nasopharyngeal  Result Value Ref Range Status   MRSA by PCR NEGATIVE NEGATIVE Final    Comment:         The GeneXpert MRSA Assay (FDA approved for NASAL specimens only), is one component of a comprehensive MRSA colonization surveillance program. It is not intended to diagnose MRSA infection nor to guide or monitor treatment for MRSA infections. Performed at [facility], [address] [place], [place] [address]     Radiology Reports CT HEAD WO CONTRAST  Result Date: 02/16/2020 CLINICAL DATA:  Follow-up subdural EXAM: CT HEAD WITHOUT CONTRAST TECHNIQUE: Contiguous axial images were obtained from the base of the skull through the vertex without intravenous contrast. COMPARISON:  February 15, 2020 FINDINGS: Brain: Revisualization of a LEFT para falcine subdural hematoma. It measures approximately 7 mm in thickness, similar in comparison to prior. There is trace subarachnoid hemorrhage overlying the LEFT frontal vertex, unchanged in comparison to prior. No midline shift. Unchanged size and configuration of the ventricular system. No new hemorrhage. Vascular: Vascular calcifications. Skull: Normal. Negative for fracture or focal lesion. Sinuses/Orbits: Status post cataract surgery. Other: None. IMPRESSION: Similar appearance of LEFT parafalcine subdural hematoma and trace LEFT frontal subarachnoid hemorrhage. Electronically Signed   By: Valentino Saxon MD   On: 02/16/2020 09:43   CT Head Wo Contrast  Result Date: 02/15/2020 CLINICAL DATA:  Altered mental status after fall. EXAM: CT HEAD WITHOUT CONTRAST TECHNIQUE: Contiguous axial images were obtained from the base of the skull through the vertex without intravenous contrast. COMPARISON:  Dec 08, 2019. FINDINGS: Brain: Mild chronic ischemic white matter disease is noted. Small left parafalcine subdural hematoma is noted posteriorly. No mass effect or midline shift is noted. No acute infarction or mass lesion is noted. Ventricular size is within normal limits. Vascular: No hyperdense vessel or unexpected calcification. Skull: Normal. Negative for  fracture or focal lesion. Sinuses/Orbits: No acute finding. Other: Small right frontal scalp hematoma is noted. IMPRESSION: Small left parafalcine subdural hematoma is noted posteriorly. Small right frontal scalp hematoma is noted. Critical Value/emergent results were called by telephone at the time of interpretation on 02/15/2020 at 8:31 pm to provider [facility] , who verbally acknowledged these results. Electronically Signed   By: Marijo Conception M.D.   On: 02/15/2020 20:32     CBC Recent Labs  Lab 02/15/20 2002 02/18/20 0618  WBC 5.5 5.9  HGB 12.9* 14.0  HCT 37.7* 41.3  PLT 284 229  MCV 96.9 99.8  MCH 33.2 33.8  MCHC 34.2 33.9  RDW 12.0 12.0  LYMPHSABS 0.5*  --   MONOABS 0.6  --   EOSABS 0.1  --   BASOSABS 0.0  --     Chemistries  Recent Labs  Lab 02/15/20 2002 02/16/20 0208 02/16/20 0549 02/18/20 0618  NA 122* 125* 128* 132*  K 4.1 3.6 4.0 3.5  CL 87* 93* 95* 97*  CO2 24 25 27 25   GLUCOSE 125* 106* 101* 102*  BUN 21 17 14 10   CREATININE 1.18 0.87 0.85 0.72  CALCIUM 9.1 8.4* 8.9 9.0  MG 1.9  --   --   --   AST 19  --   --   --   ALT 9  --   --   --   ALKPHOS 56  --   --   --   BILITOT 0.8  --   --   --    ------------------------------------------------------------------------------------------------------------------ No results for input(s): CHOL, HDL, LDLCALC, TRIG, CHOLHDL, LDLDIRECT in the last 72 hours.  No results found for: HGBA1C ------------------------------------------------------------------------------------------------------------------ Recent Labs    02/15/20 2002  TSH 3.583   ------------------------------------------------------------------------------------------------------------------ No results for input(s): VITAMINB12, FOLATE, FERRITIN, TIBC, IRON, RETICCTPCT in the last 72 hours.  Coagulation profile Recent Labs  Lab 02/15/20 2002  INR 1.0    No results for input(s): DDIMER in the last 72 hours.  Cardiac Enzymes No  results for input(s): CKMB, TROPONINI, MYOGLOBIN in the last 168 hours.  Invalid input(s): CK ------------------------------------------------------------------------------------------------------------------ No results found for: BNP   Roxan Hockey M.D on 02/18/2020 at 4:49 PM  Go to [email or web address] - for contact info  [facility] - Office  [phone]

## 2020-02-19 MED ORDER — HYDRALAZINE HCL 25 MG PO TABS: 37.5000 mg | ORAL_TABLET | Freq: Two times a day (BID) | ORAL | 3 refills | Status: DC

## 2020-02-19 MED ORDER — BENAZEPRIL HCL 10 MG PO TABS
40.0000 mg | ORAL_TABLET | Freq: Every day | ORAL | Status: DC
  Administered 2020-02-19: 40 mg via ORAL
  Filled 2020-02-19: qty 4

## 2020-02-19 MED ORDER — LEVETIRACETAM 500 MG PO TABS: 500.0000 mg | ORAL_TABLET | Freq: Two times a day (BID) | ORAL | 0 refills | Status: DC

## 2020-02-19 MED ORDER — ACETAMINOPHEN 325 MG PO TABS: 650.0000 mg | ORAL_TABLET | Freq: Four times a day (QID) | ORAL | 0 refills | Status: AC | PRN

## 2020-02-19 MED ORDER — AMLODIPINE BESYLATE 10 MG PO TABS: 10.0000 mg | ORAL_TABLET | Freq: Every day | ORAL | 3 refills | Status: DC

## 2020-02-19 MED ORDER — BENAZEPRIL HCL 40 MG PO TABS: 40.0000 mg | ORAL_TABLET | Freq: Every morning | ORAL | 3 refills | Status: DC

## 2020-02-19 NOTE — Discharge Instructions (Signed)
1)Please stop chlorthalidone due to electrolyte concerns 2)Please continue benazepril 40 mg daily, amlodipine has been increased to 10 mg daily and hydralazine has been increased to 37.5 mg twice daily for better blood pressure control 3)Please repeat BMP test on Friday, 02/23/2020 4)Fall Precautions 5)-Avoid antiplatelets and anticoagulants, 6)Avoid ibuprofen/Advil/Aleve/Motrin/Goody Powders/Naproxen/BC powders/Meloxicam/Diclofenac/Indomethacin and other Nonsteroidal anti-inflammatory medications as these will make you more likely to bleed and can cause stomach ulcers, can also cause Kidney problems.

## 2020-02-19 NOTE — Discharge Summary (Addendum)
Luis Freeman, is a 86 y.o. male  DOB February 26, 1933  MRN [id number].  Admission date:  02/15/2020  Admitting Physician  Roxan Hockey, MD  Discharge Date:  02/19/2020   Primary MD  Luis Noble, MD  Recommendations for primary care physician for things to follow:   1)Please stop chlorthalidone due to electrolyte concerns 2)Please continue benazepril 40 mg daily, amlodipine has been increased to 10 mg daily and hydralazine has been increased to 37.5 mg twice daily for better blood pressure control 3)Please repeat BMP test on Friday, 02/23/2020 4)Fall Precautions 5)-Avoid antiplatelets and anticoagulants, 6)Avoid ibuprofen/Advil/Aleve/Motrin/Goody Powders/Naproxen/BC powders/Meloxicam/Diclofenac/Indomethacin and other Nonsteroidal anti-inflammatory medications as these will make you more likely to bleed and can cause stomach ulcers, can also cause Kidney problems.   Admission Diagnosis  SDH (subdural hematoma) ([place]) [S06.5X9A]  Discharge Diagnosis  SDH (subdural hematoma) ([place]) [S06.5X9A]    Principal Problem:   SDH (subdural hematoma) ([facility]) Active Problems:   PSP (progressive supranuclear palsy) ([facility])   Acute on Chronic HypoNatremia   Gait abnormality   Movement disorder   HTN (hypertension)      Past Medical History:  Diagnosis Date  . Chronic low back pain   . Dementia ([place])   . Gait abnormality 11/15/2019  . Gait disorder   . Hypertension   . PSP (progressive supranuclear palsy) ([place]) 11/15/2019  . Vitamin B12 deficiency     Past Surgical History:  Procedure Laterality Date  . CATARACT EXTRACTION, BILATERAL    . COLONOSCOPY    . COLONOSCOPY  02/04/2011   Procedure: COLONOSCOPY;  Surgeon: Daneil Dolin, MD;  Location: [facility];  Service: Endoscopy;  Laterality: N/A;      HPI  from the history and physical done on the day of admission:    Chief Complaint: Fall  HPI: Luis Freeman  is a 86 y.o. male with medical history significant for dementia, progressive supranuclear palsy, gait abnormality. Patient was brought to the ED with reports of a fall today.  Return for evaluation, patient is confused, unable to give me history, history is obtained from patient daughter- Luis Freeman, who is present at bedside, she was not present when patient fell.  Patient spouse witnessed the fall.  Patient was standing with his spouse in the kitchen making a sandwich when suddenly he said he was very stiff and he could not move, he attempted to move and fell.  Falls in the past week.  He has a history of abnormal gait.   He ambulates with a walker at baseline.  As regards his dementia, he sometimes interacts with family, but daughter reports a delay in understanding when he is spoken to.  Otherwise he can carry out some activities of daily living. No vomiting, no loose stools.  He has maintained good oral intake.  ED Course: Stable vitals.  Sodium 122.  Hemoglobin 12.9.  Platelets 284.  PT INR within normal limits.  Head CT-small left parafalcine subdural hematoma noted posteriorly.  Small right frontal scalp hematoma also noted. EDP  talked to neurosurgeon, Dr Marcello Moores.  Brief note in chart ( pls refer to note ) - admit patient here at [facility], no beds available at [facility].  Repeat CT in the morning.   Review of Systems: Unable to ascertain.  Patient is confused.   Hospital Course:     Brief Summary:- -86 year old with past medical history relevant for progressive supranuclear palsy with significant gait and speech problems, HTN and history of B12 deficiency admitted on 02/15/2020 after a fall with traumatic subdural hemorrhage -Repeat CT head on 02/16/2020 showed stable subdural hemorrhage on the left ---Gait concerns persist,  -Patient with poor safety awareness and high risk for falling  A/p 1)Status post fall with traumatic Lt SDH--  Repeat CT head on 02/16/20 stable from  neurological standpoint -No additional new focal neurological deficits -Avoid antiplatelets and anticoagulants -Fall precautions ---Gait concerns persist,  -Patient with poor safety awareness and high risk for falling  2)Ambulatory dysfunction/unsteady gait/recurrent falls----PT eval appreciated recommends SNF rehab,  --Patient is now more unsteady more at risk of falling and requires SNF rehab to return to his pre- admission level of functioning ---Gait concerns persist,  -Patient with poor safety awareness and high risk for falling -Patient and family are agreeable to SNF placement  3)Progressive supranuclear palsy--- with significant gait, balance and ambulatory concerns as well as speech concerns --Continue Sinemet  4)HTN--- stopped chlorthalidone due to electrolyte concerns, okay to increase amlodipine to 10 mg daily for better BP control given ICH -Continue benazepril 40 mg daily  -Increase hydralazine to 37.5 mg twice daily  5) B12 deficiency----continue monthly B12 shots  6)HypoNatremia--acute on chronic symptomatic hyponatremia----POA-- baseline sodium usually in the high 120s--most likely due to chronic chlorthalidone use.  On admission sodium was down to 122 which is lower than patient's baseline --I suspect that worsening hyponatremia contributed to fall and may have worsened gait disturbance --- With IV fluids and discontinuation of chlorthalidone sodium is now up to 132 (122>>>125>>128>>132) --Chlorthalidone has been discontinued completely -Patient is to avoid excessive free water -  Disposition discharge to SNF rehab   Disposition: The patient is from: Home  Anticipated d/c is to: SNF   Patient currently is medically stable to d/c.  Code Status : Full  Family Communication:    (patient is alert, awake and coherent) --- --Discussed with Luis Freeman x 2 including daughter [name]   Consults  :  NeuroSurgeon   Discharge Condition: stable  Follow UP   Contact information for after-discharge care    [facility] .   Service: Skilled Nursing Contact information: [address] [place] [phone]                  Consults obtained -   Diet and Activity recommendation:  As advised  Discharge Instructions    Discharge Instructions    Call MD for:  difficulty breathing, headache or visual disturbances   Complete by: As directed    Call MD for:  persistant dizziness or light-headedness   Complete by: As directed    Call MD for:  persistant nausea and vomiting   Complete by: As directed    Call MD for:  severe uncontrolled pain   Complete by: As directed    Call MD for:  temperature >100.4   Complete by: As directed    Diet - low sodium heart healthy   Complete by: As directed    Discharge instructions  Complete by: As directed    1)Please stop chlorthalidone due to electrolyte concerns 2)Please continue benazepril 40 mg daily, amlodipine has been increased to 10 mg daily and hydralazine has been increased to 37.5 mg twice daily for better blood pressure control 3)Please repeat BMP test on Friday, 02/23/2020 4)Fall Precautions 5)-Avoid antiplatelets and anticoagulants, 6)Avoid ibuprofen/Advil/Aleve/Motrin/Goody Powders/Naproxen/BC powders/Meloxicam/Diclofenac/Indomethacin and other Nonsteroidal anti-inflammatory medications as these will make you more likely to bleed and can cause stomach ulcers, can also cause Kidney problems.   Increase activity slowly   Complete by: As directed         Discharge Medications     Allergies as of 02/19/2020   No Known Allergies     Medication List    STOP taking these medications   aspirin 81 MG tablet   chlorthalidone 25 MG tablet Commonly known as: HYGROTON     TAKE these medications   acetaminophen 325 MG tablet Commonly known as:  TYLENOL Take 2 tablets (650 mg total) by mouth every 6 (six) hours as needed for mild pain (or Fever >/= 101).   amLODipine 10 MG tablet Commonly known as: NORVASC Take 1 tablet (10 mg total) by mouth daily. For BP Start taking on: February 20, 2020 What changed:   medication strength  how much to take  when to take this  additional instructions   benazepril 40 MG tablet Commonly known as: LOTENSIN Take 1 tablet (40 mg total) by mouth in the morning.   carbidopa-levodopa 25-100 MG tablet Commonly known as: SINEMET IR 1/2 tablet three times a day for 3 weeks, then take one tablet three times a day What changed:   how much to take  how to take this  when to take this  additional instructions   cholecalciferol 1000 units tablet Commonly known as: VITAMIN D Take 1,000 Units by mouth in the morning.   cyanocobalamin 1000 MCG/ML injection Commonly known as: (VITAMIN B-12) Inject 1,000 mcg into the muscle every 30 (thirty) days.   hydrALAZINE 25 MG tablet Commonly known as: APRESOLINE Take 1.5 tablets (37.5 mg total) by mouth 2 (two) times daily. What changed:   how much to take  when to take this   levETIRAcetam 500 MG tablet Commonly known as: KEPPRA Take 1 tablet (500 mg total) by mouth 2 (two) times daily. For seizure prophylaxis for 1 month Only   memantine 10 MG tablet Commonly known as: NAMENDA Take 10 mg by mouth in the morning.   Refresh 1.4-0.6 % Soln Generic drug: Polyvinyl Alcohol-Povidone PF Apply 1 drop to eye daily as needed (FOR DRY EYE RELIEF).       Major procedures and Radiology Reports - PLEASE review detailed and final reports for all details, in brief -   CT HEAD WO CONTRAST  Result Date: 02/16/2020 CLINICAL DATA:  Follow-up subdural EXAM: CT HEAD WITHOUT CONTRAST TECHNIQUE: Contiguous axial images were obtained from the base of the skull through the vertex without intravenous contrast. COMPARISON:  February 15, 2020 FINDINGS: Brain:  Revisualization of a LEFT para falcine subdural hematoma. It measures approximately 7 mm in thickness, similar in comparison to prior. There is trace subarachnoid hemorrhage overlying the LEFT frontal vertex, unchanged in comparison to prior. No midline shift. Unchanged size and configuration of the ventricular system. No new hemorrhage. Vascular: Vascular calcifications. Skull: Normal. Negative for fracture or focal lesion. Sinuses/Orbits: Status post cataract surgery. Other: None. IMPRESSION: Similar appearance of LEFT parafalcine subdural hematoma and trace LEFT frontal subarachnoid hemorrhage. Electronically Signed  By: Valentino Saxon MD   On: 02/16/2020 09:43   CT Head Wo Contrast  Result Date: 02/15/2020 CLINICAL DATA:  Altered mental status after fall. EXAM: CT HEAD WITHOUT CONTRAST TECHNIQUE: Contiguous axial images were obtained from the base of the skull through the vertex without intravenous contrast. COMPARISON:  Dec 08, 2019. FINDINGS: Brain: Mild chronic ischemic white matter disease is noted. Small left parafalcine subdural hematoma is noted posteriorly. No mass effect or midline shift is noted. No acute infarction or mass lesion is noted. Ventricular size is within normal limits. Vascular: No hyperdense vessel or unexpected calcification. Skull: Normal. Negative for fracture or focal lesion. Sinuses/Orbits: No acute finding. Other: Small right frontal scalp hematoma is noted. IMPRESSION: Small left parafalcine subdural hematoma is noted posteriorly. Small right frontal scalp hematoma is noted. Critical Value/emergent results were called by telephone at the time of interpretation on 02/15/2020 at 8:31 pm to provider [facility] , who verbally acknowledged these results. Electronically Signed   By: Marijo Conception M.D.   On: 02/15/2020 20:32    Micro Results  Recent Results (from the past 240 hour(s))  SARS Coronavirus 2 by RT PCR (hospital order, performed in [facility] hospital lab)  Nasopharyngeal Nasopharyngeal Swab     Status: None   Collection Time: 02/15/20  9:07 PM   Specimen: Nasopharyngeal Swab  Result Value Ref Range Status   SARS Coronavirus 2 NEGATIVE NEGATIVE Final    Comment: (NOTE) SARS-CoV-2 target nucleic acids are NOT DETECTED.  The SARS-CoV-2 RNA is generally detectable in upper and lower respiratory specimens during the acute phase of infection. The lowest concentration of SARS-CoV-2 viral copies this assay can detect is 250 copies / mL. A negative result does not preclude SARS-CoV-2 infection and should not be used as the sole basis for treatment or other patient management decisions.  A negative result may occur with improper specimen collection / handling, submission of specimen other than nasopharyngeal swab, presence of viral mutation(s) within the areas targeted by this assay, and inadequate number of viral copies (<250 copies / mL). A negative result must be combined with clinical observations, patient history, and epidemiological information.  Fact Sheet for Patients:   StrictlyIdeas.no  Fact Sheet for Healthcare Providers: BankingDealers.co.za  This test is not yet approved or  cleared by the [place] FDA and has been authorized for detection and/or diagnosis of SARS-CoV-2 by FDA under an Emergency Use Authorization (EUA).  This EUA will remain in effect (meaning this test can be used) for the duration of the COVID-19 declaration under Section 564(b)(1) of the Act, 21 U.S.C. section 360bbb-3(b)(1), unless the authorization is terminated or revoked sooner.  Performed at [facility], [address] [place], [place] [address]   MRSA PCR Screening     Status: None   Collection Time: 02/16/20  6:20 AM   Specimen: Nasal Mucosa; Nasopharyngeal  Result Value Ref Range Status   MRSA by PCR NEGATIVE NEGATIVE Final    Comment:        The GeneXpert MRSA Assay (FDA approved for NASAL  specimens only), is one component of a comprehensive MRSA colonization surveillance program. It is not intended to diagnose MRSA infection nor to guide or monitor treatment for MRSA infections. Performed at [facility], [address] [place], [place] [address]        Today   Subjective    Chris Cripps today has no new complaints -Daughter at bedside, patient is eating and drinking well  Patient has been seen and examined prior to discharge   Objective   Blood pressure (!) 145/68, pulse 70, temperature 98.2 F (36.8 C), temperature source Oral, resp. rate 18, height 5\' 11"  (1.803 m), weight 65.3 kg, SpO2 100 %.   Intake/Output Summary (Last 24 hours) at 02/19/2020 1234 Last data filed at 02/19/2020 0043 Gross per 24 hour  Intake --  Output 10400 ml  Net -10400 ml    Exam Gen:- Awake Alert,  In no apparent distress  HEENT:- [place].AT, No sclera icterus Ears-HOH Neck-Supple Neck,No JVD,.  Lungs-  CTAB , fair symmetrical air movement CV- S1, S2 normal, regular  Abd-  +ve B.Sounds, Abd Soft, No tenderness,    Extremity/Skin:- No  edema, pedal pulses present  Psych-affect is appropriate, oriented x3 Neuro-generalized weakness, unsteady gait , no new focal deficits,     Data Review   CBC w Diff:  Lab Results  Component Value Date   WBC 5.9 02/18/2020   HGB 14.0 02/18/2020   HCT 41.3 02/18/2020   PLT 229 02/18/2020   LYMPHOPCT 9 02/15/2020   MONOPCT 12 02/15/2020   EOSPCT 1 02/15/2020   BASOPCT 1 02/15/2020    CMP:  Lab Results  Component Value Date   NA 132 (L) 02/18/2020   NA 128 (L) 01/16/2019   K 3.5 02/18/2020   CL 97 (L) 02/18/2020   CO2 25 02/18/2020   BUN 10 02/18/2020   BUN 11 01/16/2019   CREATININE 0.72 02/18/2020   PROT 7.4 02/15/2020   PROT 7.6 01/16/2019   ALBUMIN 4.5 02/15/2020   ALBUMIN 4.9 (H) 01/16/2019   BILITOT 0.8 02/15/2020   BILITOT 0.5 01/16/2019   ALKPHOS 56 02/15/2020   AST 19 02/15/2020   ALT 9 02/15/2020   .  Total Discharge time is about 33 minutes  Roxan Hockey M.D on 02/19/2020 at 12:34 PM  Go to [email or web address] -  for contact info  [facility] - Office  [phone]

## 2020-02-19 NOTE — TOC Transition Note (Signed)
Transition of Care [facility]) - CM/SW Discharge Note   Patient Details  Name: KYLO GAVIN MRN: [id number] Date of Birth: 12/04/1932  Transition [facility] Care [facility] CM/SW Contact:  Natasha Bence, LCSW Phone Number: 02/19/2020, 1:05 PM   Clinical Narrative:    CSW contacted [facility] to inquire about bed availability. [facility] confirmed bed availably. CSW also confirmed ins auth. [place] number [id number]. CSW completed med necessity form and will call EMS for transport. Nurse to call report. TOC signing off.   Final next level of care: Skilled Nursing Facility Barriers to Discharge: Barriers Resolved   Patient Goals and CMS Choice Patient states their goals for this hospitalization and ongoing recovery are:: Rehab with SNF CMS Medicare.gov Compare Post Acute Care list provided to:: Patient Choice offered to / list presented to : Patient  Discharge Placement   Existing PASRR number confirmed : 02/19/20            Patient to be transferred to facility by: [facility] Name of family member notified: Chasten Blaze Patient and family notified of of transfer: 02/19/20  Discharge Plan and Services In-house Referral: Clinical Social Work   Post Acute Care Choice: [place]                               Social Determinants of Health (SDOH) Interventions     Readmission Risk Interventions No flowsheet data found.

## 2020-02-19 NOTE — Progress Notes (Signed)
Pt discharged to [facility] via strecther by [facility]. Most belongings in daughter's possession at discharge, pt had hearing aids in ears bilaterally, glasses in case on his T-shirt and was wearing his own clothes (Tshirt, underwear, shorts with black belt).

## 2020-02-19 NOTE — Care Management Important Message (Signed)
Important Message  Patient Details  Name: Luis Freeman MRN: [id number] Date of Birth: 16-Dec-1932   Medicare Important Message Given:  Yes     Tommy Medal 02/19/2020, 11:28 AM

## 2020-02-19 NOTE — Evaluation (Signed)
Occupational Therapy Evaluation Patient Details Name: Luis Freeman MRN: [id number] DOB: 03/16/1933 Today's Date: 02/19/2020    History of Present Illness 86 y.o. male presenting wit hED after fall and confusion. Family reporting several falls in past week. CT showing left parafalcine subdural hematoma and trace left frontal subarachnoid hemorrhage. PMH including dementia, progressive supranuclear palsy, and gait abnormality.    Clinical Impression   PTA, pt was living with his wife and performing BADLs and uses RW for mobility; has an aide present 5 hours each day for safety and assist with IADLs. Pt currently requiring Min Guard A for ADLs and Min Guard-Min A for functional mobility with RW. Pt presenting with decreased cognition with decreased orientation, processing, problem solving, and awareness. Pt would benefit from further acute OT to facilitate safe dc. Recommend dc to SNF for further OT to optimize safety, independence with ADLs, and return to PLOF.     Follow Up Recommendations  SNF;Supervision/Assistance - 24 hour (Could go home with HHOT if family can set up 24/7)    Equipment Recommendations  Other (comment) (Defer to next venue)    Recommendations for Other Services PT consult     Precautions / Restrictions Precautions Precautions: Fall      Mobility Bed Mobility Overal bed mobility: Needs Assistance Bed Mobility: Supine to Sit     Supine to sit: Min guard     General bed mobility comments: Min Guard A for safety. Increased time and cues for scooting towards EOB  Transfers Overall transfer level: Needs assistance Equipment used: Rolling walker (2 wheeled) Transfers: Sit to/from Stand Sit to Stand: Min guard;Min assist         General transfer comment: Min A for initital power up into standing. Min Guard A for safety with further sit<>Stands. Cues for upright posture upon first standing    Balance Overall balance assessment: Needs  assistance Sitting-balance support: No upper extremity supported;Feet supported Sitting balance-Leahy Scale: Good     Standing balance support: During functional activity;Bilateral upper extremity supported Standing balance-Leahy Scale: Poor Standing balance comment: Reliant on UE support                           ADL either performed or assessed with clinical judgement   ADL Overall ADL's : Needs assistance/impaired Eating/Feeding: Set up;Sitting Eating/Feeding Details (indicate cue type and reason): Pt eating breakfast once set in front of him. He was able to manage opening containers and arranging utensils Grooming: Set up;Supervision/safety;Sitting   Upper Body Bathing: Supervision/ safety;Set up;Sitting   Lower Body Bathing: Min guard;Sit to/from stand   Upper Body Dressing : Supervision/safety;Set up;Sitting Upper Body Dressing Details (indicate cue type and reason): Donning gown as jacket Lower Body Dressing: Min guard;Sit to/from stand Lower Body Dressing Details (indicate cue type and reason): Min Guard A for safety i nstanding. Pt able to use figure four method to adjust socks Toilet Transfer: Min guard;Ambulation;RW;Minimal assistance (simulated to recliner) Toilet Transfer Details (indicate cue type and reason): Min A to initiate power up into stanidng from EOB. Pt demonsatrating good control once in standing. Cues for upright posture.          Functional mobility during ADLs: Min guard;Rolling walker General ADL Comments: Pt presenting with confusion, weakness, and decreased balance     Vision         Perception     Praxis      Pertinent Vitals/Pain Pain Assessment: No/denies pain  Hand Dominance Right   Extremity/Trunk Assessment Upper Extremity Assessment Upper Extremity Assessment: Generalized weakness   Lower Extremity Assessment Lower Extremity Assessment: Defer to PT evaluation   Cervical / Trunk Assessment Cervical / Trunk  Assessment: Kyphotic   Communication Communication Communication: HOH   Cognition Arousal/Alertness: Awake/alert Behavior During Therapy: WFL for tasks assessed/performed Overall Cognitive Status: Impaired/Different from baseline Area of Impairment: Orientation;Attention;Following commands;Memory;Safety/judgement;Awareness;Problem solving                 Orientation Level: Disoriented to;Place;Time;Situation Current Attention Level: Sustained Memory: Decreased short-term memory Following Commands: Follows one step commands with increased time Safety/Judgement: Decreased awareness of safety;Decreased awareness of deficits Awareness: Intellectual Problem Solving: Slow processing;Requires verbal cues General Comments: Family reporting that pt has slight delay during conversation and will repeat what you say. Family reports that his processing speed is slower than normal. Pt requiring increased time throughout session for following commands.    General Comments  Daughter present throughout session. Providing education to daughter on modification to the environment to assist with cognition. Including familiar objects such as picture or a blanket from home - particularly when he goes ot SNF for rehab.    Exercises     Shoulder Instructions      Home Living Family/patient expects to be discharged to:: Private residence Living Arrangements: Spouse/significant other Available Help at Discharge: Family;Available PRN/intermittently Type of Home: House Home Access: Ramped entrance     Home Layout: Multi-level;Able to live on main level with bedroom/bathroom;Laundry or work area in basement (Patient does not go to basement)     Bathroom Shower/Tub: Occupational psychologist: Standard     Home Equipment: Environmental consultant - 2 wheels;Cane - single point;Shower seat - built in          Prior Functioning/Environment Level of Independence: Needs assistance  Gait / Transfers  Assistance Needed: Use of RW for mobility and supervision at home.  ADL's / Homemaking Assistance Needed: Pt performing BADLs. Family performs IADLs   Comments: home aide 25 hours/week, family members stays at night        OT Problem List: Decreased strength;Decreased range of motion;Decreased activity tolerance;Impaired balance (sitting and/or standing);Decreased knowledge of use of DME or AE;Decreased knowledge of precautions;Decreased safety awareness;Decreased cognition      OT Treatment/Interventions: Self-care/ADL training;Therapeutic exercise;DME and/or AE instruction;Energy conservation;Therapeutic activities;Patient/family education    OT Goals(Current goals can be found in the care plan section) Acute Rehab OT Goals Patient Stated Goal: Return home to his wife OT Goal Formulation: With patient/family Time For Goal Achievement: 03/04/20 Potential to Achieve Goals: Good  OT Frequency: Min 2X/week   Barriers to D/C:            Co-evaluation              AM-PAC OT "6 Clicks" Daily Activity     Outcome Measure Help from another person eating meals?: A Little Help from another person taking care of personal grooming?: A Little Help from another person toileting, which includes using toliet, bedpan, or urinal?: A Little Help from another person bathing (including washing, rinsing, drying)?: A Little Help from another person to put on and taking off regular upper body clothing?: A Little Help from another person to put on and taking off regular lower body clothing?: A Little 6 Click Score: 18   End of Session Equipment Utilized During Treatment: Gait belt;Rolling walker Nurse Communication: Mobility status  Activity Tolerance: Patient tolerated treatment well Patient left: in chair;with  call bell/phone within reach;with chair alarm set;with family/visitor present  OT Visit Diagnosis: Unsteadiness on feet (R26.81);Other abnormalities of gait and mobility  (R26.89);Muscle weakness (generalized) (M62.81);History of falling (Z91.81)                Time: 3291-9166 OT Time Calculation (min): 22 min Charges:  OT General Charges $OT Visit: 1 Visit OT Evaluation $OT Eval Moderate Complexity: [place], OTR/L Acute Rehab Pager: [phone] Office: [place] 02/19/2020, 9:33 AM

## 2020-02-21 DIAGNOSIS — E871 Hypo-osmolality and hyponatremia: Secondary | ICD-10-CM | POA: Diagnosis present

## 2020-02-22 DIAGNOSIS — S065X0D Traumatic subdural hemorrhage without loss of consciousness, subsequent encounter: Secondary | ICD-10-CM | POA: Diagnosis not present

## 2020-02-23 DIAGNOSIS — Z79899 Other long term (current) drug therapy: Secondary | ICD-10-CM | POA: Diagnosis not present

## 2020-02-23 DIAGNOSIS — I10 Essential (primary) hypertension: Secondary | ICD-10-CM | POA: Diagnosis not present

## 2020-02-23 DIAGNOSIS — E871 Hypo-osmolality and hyponatremia: Secondary | ICD-10-CM | POA: Diagnosis not present

## 2020-02-26 DIAGNOSIS — E538 Deficiency of other specified B group vitamins: Secondary | ICD-10-CM | POA: Diagnosis not present

## 2020-02-26 DIAGNOSIS — E871 Hypo-osmolality and hyponatremia: Secondary | ICD-10-CM | POA: Diagnosis not present

## 2020-02-26 DIAGNOSIS — F0280 Dementia in other diseases classified elsewhere without behavioral disturbance: Secondary | ICD-10-CM | POA: Diagnosis not present

## 2020-02-26 DIAGNOSIS — I10 Essential (primary) hypertension: Secondary | ICD-10-CM | POA: Diagnosis not present

## 2020-02-26 DIAGNOSIS — Z9181 History of falling: Secondary | ICD-10-CM | POA: Diagnosis not present

## 2020-02-26 DIAGNOSIS — G8929 Other chronic pain: Secondary | ICD-10-CM | POA: Diagnosis not present

## 2020-02-26 DIAGNOSIS — S065X0D Traumatic subdural hemorrhage without loss of consciousness, subsequent encounter: Secondary | ICD-10-CM | POA: Diagnosis not present

## 2020-02-26 DIAGNOSIS — M545 Low back pain: Secondary | ICD-10-CM | POA: Diagnosis not present

## 2020-02-26 DIAGNOSIS — G231 Progressive supranuclear ophthalmoplegia [Steele-Richardson-Olszewski]: Secondary | ICD-10-CM | POA: Diagnosis not present

## 2020-02-27 ENCOUNTER — Emergency Department (HOSPITAL_COMMUNITY): Payer: Medicare PPO

## 2020-02-27 ENCOUNTER — Encounter (HOSPITAL_COMMUNITY): Payer: Self-pay | Admitting: Emergency Medicine

## 2020-02-27 ENCOUNTER — Other Ambulatory Visit: Payer: Self-pay

## 2020-02-27 ENCOUNTER — Ambulatory Visit
Admission: EM | Admit: 2020-02-27 | Discharge: 2020-02-27 | Disposition: A | Discharge status: Home / Self Care | Payer: Medicare PPO

## 2020-02-27 ENCOUNTER — Encounter: Payer: Self-pay | Admitting: Emergency Medicine

## 2020-02-27 ENCOUNTER — Inpatient Hospital Stay (HOSPITAL_COMMUNITY)
Admission: EM | Admit: 2020-02-27 | Discharge: 2020-02-27 | DRG: 690 | Discharge status: Against Medical Advice | Payer: Medicare PPO | Attending: Internal Medicine | Admitting: Internal Medicine

## 2020-02-27 DIAGNOSIS — I2510 Atherosclerotic heart disease of native coronary artery without angina pectoris: Secondary | ICD-10-CM | POA: Diagnosis not present

## 2020-02-27 DIAGNOSIS — I6200 Nontraumatic subdural hemorrhage, unspecified: Secondary | ICD-10-CM | POA: Diagnosis not present

## 2020-02-27 DIAGNOSIS — E871 Hypo-osmolality and hyponatremia: Secondary | ICD-10-CM | POA: Diagnosis present

## 2020-02-27 DIAGNOSIS — Z79899 Other long term (current) drug therapy: Secondary | ICD-10-CM

## 2020-02-27 DIAGNOSIS — I10 Essential (primary) hypertension: Secondary | ICD-10-CM | POA: Diagnosis present

## 2020-02-27 DIAGNOSIS — R109 Unspecified abdominal pain: Secondary | ICD-10-CM | POA: Diagnosis not present

## 2020-02-27 DIAGNOSIS — R296 Repeated falls: Secondary | ICD-10-CM | POA: Diagnosis present

## 2020-02-27 DIAGNOSIS — N3001 Acute cystitis with hematuria: Secondary | ICD-10-CM

## 2020-02-27 DIAGNOSIS — F0390 Unspecified dementia without behavioral disturbance: Secondary | ICD-10-CM | POA: Diagnosis present

## 2020-02-27 DIAGNOSIS — Z20822 Contact with and (suspected) exposure to covid-19: Secondary | ICD-10-CM | POA: Diagnosis not present

## 2020-02-27 DIAGNOSIS — I619 Nontraumatic intracerebral hemorrhage, unspecified: Secondary | ICD-10-CM | POA: Diagnosis not present

## 2020-02-27 DIAGNOSIS — G259 Extrapyramidal and movement disorder, unspecified: Secondary | ICD-10-CM | POA: Diagnosis present

## 2020-02-27 DIAGNOSIS — G231 Progressive supranuclear ophthalmoplegia [Steele-Richardson-Olszewski]: Secondary | ICD-10-CM | POA: Diagnosis present

## 2020-02-27 DIAGNOSIS — G9341 Metabolic encephalopathy: Secondary | ICD-10-CM | POA: Diagnosis present

## 2020-02-27 DIAGNOSIS — R319 Hematuria, unspecified: Secondary | ICD-10-CM | POA: Diagnosis not present

## 2020-02-27 DIAGNOSIS — N3000 Acute cystitis without hematuria: Secondary | ICD-10-CM | POA: Diagnosis not present

## 2020-02-27 DIAGNOSIS — R531 Weakness: Secondary | ICD-10-CM | POA: Diagnosis not present

## 2020-02-27 LAB — DIFFERENTIAL
Abs Immature Granulocytes: 0.08 10*3/uL — ABNORMAL HIGH (ref 0.00–0.07)
Basophils Absolute: 0 10*3/uL (ref 0.0–0.1)
Basophils Relative: 0 %
Eosinophils Absolute: 0.5 10*3/uL (ref 0.0–0.5)
Eosinophils Relative: 4 %
Immature Granulocytes: 1 %
Lymphocytes Relative: 1 %
Lymphs Abs: 0.2 10*3/uL — ABNORMAL LOW (ref 0.7–4.0)
Monocytes Absolute: 1.1 10*3/uL — ABNORMAL HIGH (ref 0.1–1.0)
Monocytes Relative: 7 %
Neutro Abs: 12.8 10*3/uL — ABNORMAL HIGH (ref 1.7–7.7)
Neutrophils Relative %: 87 %

## 2020-02-27 LAB — BASIC METABOLIC PANEL
Anion gap: 9 (ref 5–15)
BUN: 15 mg/dL (ref 8–23)
CO2: 24 mmol/L (ref 22–32)
Calcium: 9.1 mg/dL (ref 8.9–10.3)
Chloride: 92 mmol/L — ABNORMAL LOW (ref 98–111)
Creatinine, Ser: 0.8 mg/dL (ref 0.61–1.24)
GFR calc Af Amer: >60 mL/min (ref >60)
GFR calc non Af Amer: >60 mL/min (ref >60)
Glucose, Bld: 108 mg/dL — ABNORMAL HIGH (ref 70–99)
Potassium: 4.6 mmol/L (ref 3.5–5.1)
Sodium: 125 mmol/L — ABNORMAL LOW (ref 135–145)

## 2020-02-27 LAB — URINALYSIS, ROUTINE W REFLEX MICROSCOPIC
Bilirubin Urine: NEGATIVE
Glucose, UA: NEGATIVE mg/dL
Ketones, ur: NEGATIVE mg/dL
Nitrite: POSITIVE — AB
Protein, ur: 100 mg/dL — AB
RBC / HPF: >50 RBC/hpf — ABNORMAL HIGH (ref 0–5)
Specific Gravity, Urine: 1.015 (ref 1.005–1.030)
WBC, UA: >50 WBC/hpf — ABNORMAL HIGH (ref 0–5)
pH: 7 (ref 5.0–8.0)

## 2020-02-27 LAB — CBC
HCT: 40.8 % (ref 39.0–52.0)
Hemoglobin: 13.9 g/dL (ref 13.0–17.0)
MCH: 33.6 pg (ref 26.0–34.0)
MCHC: 34.1 g/dL (ref 30.0–36.0)
MCV: 98.6 fL (ref 80.0–100.0)
Platelets: 255 10*3/uL (ref 150–400)
RBC: 4.14 MIL/uL — ABNORMAL LOW (ref 4.22–5.81)
RDW: 12.1 % (ref 11.5–15.5)
WBC: 15.1 10*3/uL — ABNORMAL HIGH (ref 4.0–10.5)
nRBC: 0 % (ref 0.0–0.2)

## 2020-02-27 LAB — LACTIC ACID, PLASMA: Lactic Acid, Venous: 1.4 mmol/L (ref 0.5–1.9)

## 2020-02-27 MED ORDER — SODIUM CHLORIDE 0.9 % IV BOLUS
1000.0000 mL | Freq: Once | INTRAVENOUS | Status: AC
  Administered 2020-02-27: 1000 mL via INTRAVENOUS

## 2020-02-27 MED ORDER — SODIUM CHLORIDE 0.9 % IV SOLN
1.0000 g | Freq: Once | INTRAVENOUS | Status: AC
  Administered 2020-02-27: 1 g via INTRAVENOUS
  Filled 2020-02-27: qty 10

## 2020-02-27 MED ORDER — CEPHALEXIN 500 MG PO CAPS
500.0000 mg | ORAL_CAPSULE | Freq: Two times a day (BID) | ORAL | 0 refills | Status: DC
Start: 2020-02-27 — End: 2020-03-31

## 2020-02-27 NOTE — ED Triage Notes (Signed)
Pt has been urinating blood since this morning.  Pt denies pain.  Family also reports pt was weaker than normal last night and today.  Family reports pt had a sodium level of 127 at his last doctor appt.

## 2020-02-27 NOTE — ED Triage Notes (Addendum)
Pt has been urinating blood since this morning.  Pt did report RT flank pain at one point, but then pt denied the pain when ask later.  Family also reports pt was weaker than normal last night and today.  Family reports pt had a sodium level of 127 at his last doctor appt.

## 2020-02-27 NOTE — Discharge Instructions (Signed)
Take the entire course of the antibiotic prescribed and make sure you are drinking plenty of fluids.  It is recommended that you consume a normal amount of salt rather than a low salt diet (but do not go overboard trying to eat especially salty foods or adding lots of salt at the table).  You will need a close recheck by your primary MD including a recheck of your sodium level within the next 2 to 3 days to ensure your sodium level is not continuing to fall.  Return here immediately for any worsening symptoms including fevers, vomiting, increased confusion or weakness.  Severe hyponatremia can result in a seizure or coma.  It is very important that you maintain close follow-up care with your primary MD regarding this condition.

## 2020-02-27 NOTE — ED Provider Notes (Addendum)
[facility] EMERGENCY DEPARTMENT Provider Note   CSN: [id number] Arrival date & time: 02/27/20  1245     History Chief Complaint  Patient presents with  . hyponatremia    Luis Freeman is a 86 y.o. male with a history of dementia, hypertension, progressive supranuclear palsy, history of significant hyponatremia and multiple recent falls with a sustained subdural hematoma which required admission on July 30, has since returned home, but with increasing confusion, drowsiness and woke today with gross hematuria.  He was discharged home with Keppra with plans to be on this medication for 30 days while his hematoma resolves, daughter is noted significant difference in his mentation since being on this medication.  He denies having any abdominal or flank pain, he does have a borderline temperature, was documented at 100.1 at the urgent care visit prior to being directed here.  Daughter states he has had poor p.o. intake since coming home from the nursing home.  He has had no cough or documented fevers at home, to her knowledge does not have a history of kidney stones.  He normally walks with a gait belt and assistance at home, but since yesterday he has had difficulty weightbearing and had several near falls even with assistance.  The history is provided by a relative (daughter at bedside).       Past Medical History:  Diagnosis Date  . Chronic low back pain   . Dementia ([place])   . Gait abnormality 11/15/2019  . Gait disorder   . Hypertension   . PSP (progressive supranuclear palsy) ([place]) 11/15/2019  . Vitamin B12 deficiency     Patient Active Problem List   Diagnosis Date Noted  . Acute on Chronic HypoNatremia 02/21/2020  . HTN (hypertension) 02/16/2020  . SDH (subdural hematoma) ([place]) 02/15/2020  . PSP (progressive supranuclear palsy) ([place]) 11/15/2019  . Gait abnormality 11/15/2019  . Movement disorder 05/18/2019  . Dementia ([place]) 11/10/2018    Past Surgical History:  Procedure  Laterality Date  . CATARACT EXTRACTION, BILATERAL    . COLONOSCOPY    . COLONOSCOPY  02/04/2011   Procedure: COLONOSCOPY;  Surgeon: Daneil Dolin, MD;  Location: [facility]ITE;  Service: Endoscopy;  Laterality: N/A;       Family History  Problem Relation Age of Onset  . Diabetes Mother   . Cancer Father   . Cancer - Lung Brother     Social History   Tobacco Use  . Smoking status: Never Smoker  . Smokeless tobacco: Never Used  Vaping Use  . Vaping Use: Never used  Substance Use Topics  . Alcohol use: Not Currently    Alcohol/week: 0.0 standard drinks  . Drug use: No    Home Medications Prior to Admission medications   Medication Sig Start Date End Date Taking? Authorizing Provider  acetaminophen (TYLENOL) 325 MG tablet Take 2 tablets (650 mg total) by mouth every 6 (six) hours as needed for mild pain (or Fever >/= 101). 02/19/20   Emokpae, Courage, MD  amLODipine (NORVASC) 10 MG tablet Take 1 tablet (10 mg total) by mouth daily. For BP 02/20/20   Emokpae, Courage, MD  benazepril (LOTENSIN) 40 MG tablet Take 1 tablet (40 mg total) by mouth in the morning. 02/19/20   Roxan Hockey, MD  carbidopa-levodopa (SINEMET IR) 25-100 MG tablet 1/2 tablet three times a day for 3 weeks, then take one tablet three times a day Patient taking differently: Take 1 tablet by mouth 3 (three) times daily.  11/15/19  Kathrynn Ducking, MD  cholecalciferol (VITAMIN D) 1000 units tablet Take 1,000 Units by mouth in the morning.     [provider]  cyanocobalamin (,VITAMIN B-12,) 1000 MCG/ML injection Inject 1,000 mcg into the muscle every 30 (thirty) days.  08/02/15   [provider]  hydrALAZINE (APRESOLINE) 25 MG tablet Take 1.5 tablets (37.5 mg total) by mouth 2 (two) times daily. 02/19/20   Roxan Hockey, MD  levETIRAcetam (KEPPRA) 500 MG tablet Take 1 tablet (500 mg total) by mouth 2 (two) times daily. For seizure prophylaxis for 1 month Only 02/19/20   Roxan Hockey, MD    memantine (NAMENDA) 10 MG tablet Take 10 mg by mouth in the morning.  08/08/19   [provider]  Polyvinyl Alcohol-Povidone PF (REFRESH) 1.4-0.6 % SOLN Apply 1 drop to eye daily as needed (FOR DRY EYE RELIEF).     [provider]    Allergies    Patient has no known allergies.  Review of Systems   Review of Systems  Constitutional: Positive for appetite change and fever.  HENT: Negative for congestion and sore throat.   Eyes: Negative.   Respiratory: Negative for chest tightness and shortness of breath.   Cardiovascular: Negative for chest pain.  Gastrointestinal: Negative for abdominal pain, nausea and vomiting.  Genitourinary: Positive for hematuria. Negative for flank pain and urgency.  Musculoskeletal: Negative for arthralgias, joint swelling and neck pain.  Skin: Negative.  Negative for rash and wound.  Neurological: Positive for weakness. Negative for dizziness, light-headedness, numbness and headaches.  Psychiatric/Behavioral: Negative.     Physical Exam Updated Vital Signs BP (!) 150/69 (BP Location: Left Arm)   Pulse 88   Temp 99.9 F (37.7 C) (Oral)   Resp 14   Ht 5\' 11"  (1.803 m)   Wt 67.1 kg   SpO2 96%   BMI 20.64 kg/m   Physical Exam Vitals and nursing note reviewed.  Constitutional:      Appearance: He is well-developed. He is not toxic-appearing.  HENT:     Head: Normocephalic and atraumatic.     Mouth/Throat:     Mouth: Mucous membranes are moist.  Eyes:     Conjunctiva/sclera: Conjunctivae normal.     Pupils: Pupils are equal, round, and reactive to light.  Cardiovascular:     Rate and Rhythm: Normal rate and regular rhythm.     Heart sounds: Normal heart sounds.  Pulmonary:     Effort: Pulmonary effort is normal.     Breath sounds: Normal breath sounds. No wheezing.  Abdominal:     General: Bowel sounds are normal.     Palpations: Abdomen is soft.     Tenderness: There is no abdominal tenderness. There is no right CVA  tenderness, left CVA tenderness or guarding.  Musculoskeletal:        General: Normal range of motion.     Cervical back: Normal range of motion.  Skin:    General: Skin is warm and dry.  Neurological:     Mental Status: He is lethargic and disoriented.     Comments: Cooperative.  Tends to repeat the sentence when conversation directed at him.      ED Results / Procedures / Treatments   Labs (all labs ordered are listed, but only abnormal results are displayed) Labs Reviewed  URINALYSIS, ROUTINE W REFLEX MICROSCOPIC - Abnormal; Notable for the following components:      Result Value   APPearance HAZY (*)    Hgb urine dipstick  LARGE (*)    Protein, ur 100 (*)    Nitrite POSITIVE (*)    Leukocytes,Ua MODERATE (*)    RBC / HPF >50 (*)    WBC, UA >50 (*)    Bacteria, UA RARE (*)    All other components within normal limits  BASIC METABOLIC PANEL - Abnormal; Notable for the following components:   Sodium 125 (*)    Chloride 92 (*)    Glucose, Bld 108 (*)    All other components within normal limits  CBC - Abnormal; Notable for the following components:   WBC 15.1 (*)    RBC 4.14 (*)    All other components within normal limits  DIFFERENTIAL - Abnormal; Notable for the following components:   Neutro Abs 12.8 (*)    Lymphs Abs 0.2 (*)    Monocytes Absolute 1.1 (*)    Abs Immature Granulocytes 0.08 (*)    All other components within normal limits  URINE CULTURE  SARS CORONAVIRUS 2 BY RT PCR (HOSPITAL ORDER, [facility])  LACTIC ACID, PLASMA    EKG None  Radiology DG ABD ACUTE 2+V W 1V CHEST  Result Date: 02/27/2020 CLINICAL DATA:  Pt has been urinating blood since this morning. Pt did report RT flank pain at one point, but then pt denied the pain when ask later. Family also reports pt was weaker than normal last night and today EXAM: DG ABDOMEN ACUTE W/ 1V CHEST COMPARISON:  None. FINDINGS: There is no bowel dilation to suggest obstruction. No  free air. Moderate increase colonic stool burden is noted. No evidence of renal or ureteral stones. There are scattered vascular calcifications. Soft tissues are otherwise unremarkable. Heart, mediastinum and hila are unremarkable. Lungs are clear. No convincing pleural effusion or pneumothorax. Skeletal structures are demineralized. No acute skeletal abnormality. IMPRESSION: 1. No acute findings. No evidence of bowel obstruction. No free air. 2. Moderate increase colonic stool burden. 3. No active cardiopulmonary disease. Electronically Signed   By: Lajean Manes M.D.   On: 02/27/2020 15:09    Procedures Procedures (including critical care time)  Medications Ordered in ED Medications  sodium chloride 0.9 % bolus 1,000 mL (has no administration in time range)  cefTRIAXone (ROCEPHIN) 1 g in sodium chloride 0.9 % 100 mL IVPB (has no administration in time range)    ED Course  I have reviewed the triage vital signs and the nursing notes.  Pertinent labs & imaging results that were available during my care of the patient were reviewed by me and considered in my medical decision making (see chart for details).  Clinical Course as of Feb 26 1514  Tue Feb 26, 4990  [id number] 86 year old male here from home history of dementia.  Status post subdural last month went to rehab now back at home.  Having increased confusion.  No hematuria.  Urinalysis looks infected.  Sodium also decreased again to 125.  Will likely need admission for antibiotics.  Getting reCT head   [MB]    Clinical Course User Index [MB] Hayden Rasmussen, MD   MDM Rules/Calculators/A&P                          Pt with acute uti, increased lethargy and confusion along with declining sodium level again, currently 125.  Dg states she has been limiting his fluid intake to 2L per day as recommended at last hospitalization for this.Pt started on rocephin for uti,  urine cx sent.    CT head imaging negative for any sign of expanding subdural.   Discussed with pt and dg at bedside - recommended admission for further IV abx and she and pt are agreeable.  Discussed with Dr. Denton Brick who accepts pt for admission.   Final Clinical Impression(s) / ED Diagnoses Final diagnoses:  Acute hyponatremia  Acute cystitis with hematuria    Rx / DC Orders ED Discharge Orders    None       Landis Martins 02/27/20 1639    Hayden Rasmussen, MD 02/27/20 1919  7:56 PM After patient was admitted to the hospitalist service and was evaluated by Dr. Rosalva Ferron, the patient and his caregiver daughter decided that he would be better cared for at home with close follow-up with the PCP.  I was asked to discharge the patient [place].  He was prescribed Keflex for his UTI.  Given strict return precautions and strongly recommended close follow-up with his PCP this week for recheck of his symptoms and his sodium level.    [name], PA-C 02/27/20 1958    Hayden Rasmussen, MD 02/28/20 [phone]

## 2020-02-27 NOTE — ED Notes (Signed)
Patient is being discharged from the Urgent Care and sent to the Emergency Department via pov. Per [name], patient is in need of higher level of care due to fever, hematuria and fatigue . Patient is aware and verbalizes understanding of plan of care.  Vitals:   02/27/20 1153  BP: (!) 152/72  Pulse: 86  Resp: 16  Temp: 100.1 F (37.8 C)  SpO2: 95%

## 2020-02-27 NOTE — ED Notes (Signed)
Pts family expressing wishes to leave AMA, MD made aware, PA in room w/pt speaking with family

## 2020-02-27 NOTE — Progress Notes (Addendum)
I was paged for admission, I saw patient with daughter at bedside.  Daughter requesting to be discharged from the ED, she feels that father would be better cared for at home.  She had many questions, I spent at least 30 minutes with patient and daughter at bedside.  She tells me after her last hospitalization 7/29 - 8/2, patient declined significantly.  She is afraid that if he is admitted this time, he would decline even further.  She tells me she wants her father to be ambulated more frequently in the hospital, also reports that her father was not promptly been seen when he needed attention while hospitalized.  Patient had a condom catheter, and a bedside urinal.  But she reports that patients beddings were still soiled and were not promptly changed. Patient was discharged to nursing home, but patient was at the nursing home for just 1 night, 1 of patient's daughter was with patient all night at the nursing home. Family felt that patient was not getting the care and attention patient needed.  Family is very concerned about falls, as patient falls quite frequently at home due to gait problems.  So family took patient home the next day from the nursing home.  Patient is currently being admitted for urinary tract infection, confusion, and hyponatremia.  I have told daughter that I cannot safely discharge patient from the ED, since he has not been hospitalized, I will talk to the ED provider to determine patient's disposition.  I explained reasons why patient is being admitted.  Daughter wants the option of discharging patient home with antibiotics, following up closely as outpatient.   I have talked to ED provider who will determine patient's disposition.  LOS- NO CHARGE.  Bing Neighbors, MD. [facility]. 02/27/20 10:45 PM

## 2020-02-28 ENCOUNTER — Telehealth: Payer: Self-pay | Admitting: Neurology

## 2020-02-28 NOTE — Telephone Encounter (Signed)
Spoke with daughter regarding recent hospitalization of patient. She states patient is having side effects from [place] (he was placed on this following his hospital admission in late July). She would like to know if patient should continue this medication. Please advise.

## 2020-02-28 NOTE — Telephone Encounter (Signed)
I called and talk with the wife.  The patient had a fall and sustained a small subdural hematoma around 08 Dec 2019.  He was placed on Keppra prophylactically to prevent seizures, he was to stay on the medication until the subdural resolved.  He was in the emergency room yesterday for a urinary tract infection, a CT scan of the brain was done and showed resolution of the subdural.   Patient is suffering from hyponatremia.  The patient however has never had a seizure.  If he is not tolerating the Keppra, I would go down the medication to 1/2 tablet twice daily for 1 week and then stop the medication.

## 2020-02-28 NOTE — Telephone Encounter (Signed)
Attempted or reach pt's daughter Luis Freeman was full and not accepting new messages at this time.

## 2020-02-29 DIAGNOSIS — F0280 Dementia in other diseases classified elsewhere without behavioral disturbance: Secondary | ICD-10-CM | POA: Diagnosis not present

## 2020-02-29 DIAGNOSIS — S065X0D Traumatic subdural hemorrhage without loss of consciousness, subsequent encounter: Secondary | ICD-10-CM | POA: Diagnosis not present

## 2020-02-29 DIAGNOSIS — M545 Low back pain: Secondary | ICD-10-CM | POA: Diagnosis not present

## 2020-02-29 DIAGNOSIS — E538 Deficiency of other specified B group vitamins: Secondary | ICD-10-CM | POA: Diagnosis not present

## 2020-02-29 DIAGNOSIS — I10 Essential (primary) hypertension: Secondary | ICD-10-CM | POA: Diagnosis not present

## 2020-02-29 DIAGNOSIS — E871 Hypo-osmolality and hyponatremia: Secondary | ICD-10-CM | POA: Diagnosis not present

## 2020-02-29 DIAGNOSIS — G231 Progressive supranuclear ophthalmoplegia [Steele-Richardson-Olszewski]: Secondary | ICD-10-CM | POA: Diagnosis not present

## 2020-02-29 DIAGNOSIS — Z9181 History of falling: Secondary | ICD-10-CM | POA: Diagnosis not present

## 2020-02-29 DIAGNOSIS — G8929 Other chronic pain: Secondary | ICD-10-CM | POA: Diagnosis not present

## 2020-02-29 LAB — URINE CULTURE: Culture: >=100000 — AB

## 2020-03-01 DIAGNOSIS — I10 Essential (primary) hypertension: Secondary | ICD-10-CM | POA: Diagnosis not present

## 2020-03-01 DIAGNOSIS — N390 Urinary tract infection, site not specified: Secondary | ICD-10-CM | POA: Diagnosis not present

## 2020-03-01 DIAGNOSIS — E871 Hypo-osmolality and hyponatremia: Secondary | ICD-10-CM | POA: Diagnosis not present

## 2020-03-01 DIAGNOSIS — S065X9A Traumatic subdural hemorrhage with loss of consciousness of unspecified duration, initial encounter: Secondary | ICD-10-CM | POA: Diagnosis not present

## 2020-03-03 ENCOUNTER — Telehealth: Payer: Self-pay | Admitting: Emergency Medicine

## 2020-03-03 NOTE — Telephone Encounter (Signed)
Post ED Visit - Positive Culture Follow-up  Culture report reviewed by antimicrobial stewardship pharmacist: [place] [place] Team []  Elenor Quinones, Pharm.D. []  Heide Guile, Pharm.D., BCPS AQ-ID []  Parks Neptune, Pharm.D., BCPS []  Alycia Rossetti, Pharm.D., BCPS []  [place], Pharm.D., BCPS, AAHIVP []  Legrand Como, Pharm.D., BCPS, AAHIVP []  Salome Arnt, PharmD, BCPS []  Johnnette Gourd, PharmD, BCPS []  Hughes Better, PharmD, BCPS [x]  Norina Buzzard, PharmD []  Laqueta Linden, PharmD, BCPS []  Albertina Parr, PharmD  [facility] []  Leodis Sias, PharmD []  Lindell Spar, PharmD []  Royetta Asal, PharmD []  Graylin Shiver, Rph []  Rema Fendt) Glennon Mac, PharmD []  Arlyn Dunning, PharmD []  Netta Cedars, PharmD []  Dia Sitter, PharmD []  Leone Haven, PharmD []  Gretta Arab, PharmD []  Theodis Shove, PharmD []  Peggyann Juba, PharmD []  Reuel Boom, PharmD   Positive urine culture Treated with Cephalexin, organism sensitive to the same and no further patient follow-up is required at this time.  Sandi Raveling Gammons 03/03/2020, 3:02 PM

## 2020-03-05 DIAGNOSIS — S065X0D Traumatic subdural hemorrhage without loss of consciousness, subsequent encounter: Secondary | ICD-10-CM | POA: Diagnosis not present

## 2020-03-05 DIAGNOSIS — G8929 Other chronic pain: Secondary | ICD-10-CM | POA: Diagnosis not present

## 2020-03-05 DIAGNOSIS — I10 Essential (primary) hypertension: Secondary | ICD-10-CM | POA: Diagnosis not present

## 2020-03-05 DIAGNOSIS — Z9181 History of falling: Secondary | ICD-10-CM | POA: Diagnosis not present

## 2020-03-05 DIAGNOSIS — E538 Deficiency of other specified B group vitamins: Secondary | ICD-10-CM | POA: Diagnosis not present

## 2020-03-05 DIAGNOSIS — G231 Progressive supranuclear ophthalmoplegia [Steele-Richardson-Olszewski]: Secondary | ICD-10-CM | POA: Diagnosis not present

## 2020-03-05 DIAGNOSIS — F0280 Dementia in other diseases classified elsewhere without behavioral disturbance: Secondary | ICD-10-CM | POA: Diagnosis not present

## 2020-03-05 DIAGNOSIS — E871 Hypo-osmolality and hyponatremia: Secondary | ICD-10-CM | POA: Diagnosis not present

## 2020-03-05 DIAGNOSIS — M545 Low back pain: Secondary | ICD-10-CM | POA: Diagnosis not present

## 2020-03-06 DIAGNOSIS — Z79899 Other long term (current) drug therapy: Secondary | ICD-10-CM | POA: Diagnosis not present

## 2020-03-06 DIAGNOSIS — G231 Progressive supranuclear ophthalmoplegia [Steele-Richardson-Olszewski]: Secondary | ICD-10-CM | POA: Diagnosis not present

## 2020-03-06 DIAGNOSIS — R4189 Other symptoms and signs involving cognitive functions and awareness: Secondary | ICD-10-CM | POA: Diagnosis not present

## 2020-03-06 DIAGNOSIS — N393 Stress incontinence (female) (male): Secondary | ICD-10-CM | POA: Diagnosis not present

## 2020-03-06 DIAGNOSIS — R1312 Dysphagia, oropharyngeal phase: Secondary | ICD-10-CM | POA: Diagnosis not present

## 2020-03-06 DIAGNOSIS — Z7189 Other specified counseling: Secondary | ICD-10-CM | POA: Diagnosis not present

## 2020-03-06 DIAGNOSIS — R471 Dysarthria and anarthria: Secondary | ICD-10-CM | POA: Diagnosis not present

## 2020-03-06 DIAGNOSIS — R3915 Urgency of urination: Secondary | ICD-10-CM | POA: Diagnosis not present

## 2020-03-06 DIAGNOSIS — R293 Abnormal posture: Secondary | ICD-10-CM | POA: Diagnosis not present

## 2020-03-06 DIAGNOSIS — R351 Nocturia: Secondary | ICD-10-CM | POA: Diagnosis not present

## 2020-03-06 DIAGNOSIS — Z789 Other specified health status: Secondary | ICD-10-CM | POA: Diagnosis not present

## 2020-03-07 DIAGNOSIS — E871 Hypo-osmolality and hyponatremia: Secondary | ICD-10-CM | POA: Diagnosis not present

## 2020-03-07 DIAGNOSIS — I10 Essential (primary) hypertension: Secondary | ICD-10-CM | POA: Diagnosis not present

## 2020-03-07 DIAGNOSIS — Z79899 Other long term (current) drug therapy: Secondary | ICD-10-CM | POA: Diagnosis not present

## 2020-03-11 DIAGNOSIS — D519 Vitamin B12 deficiency anemia, unspecified: Secondary | ICD-10-CM | POA: Diagnosis not present

## 2020-03-14 DIAGNOSIS — N3000 Acute cystitis without hematuria: Secondary | ICD-10-CM | POA: Diagnosis not present

## 2020-03-15 ENCOUNTER — Other Ambulatory Visit: Payer: Self-pay | Admitting: Neurology

## 2020-03-22 DIAGNOSIS — I10 Essential (primary) hypertension: Secondary | ICD-10-CM | POA: Diagnosis not present

## 2020-03-22 DIAGNOSIS — N400 Enlarged prostate without lower urinary tract symptoms: Secondary | ICD-10-CM | POA: Diagnosis not present

## 2020-03-22 DIAGNOSIS — G231 Progressive supranuclear ophthalmoplegia [Steele-Richardson-Olszewski]: Secondary | ICD-10-CM | POA: Diagnosis not present

## 2020-03-24 DIAGNOSIS — S065X0D Traumatic subdural hemorrhage without loss of consciousness, subsequent encounter: Secondary | ICD-10-CM | POA: Diagnosis not present

## 2020-03-27 DIAGNOSIS — R3915 Urgency of urination: Secondary | ICD-10-CM | POA: Diagnosis not present

## 2020-03-27 DIAGNOSIS — R351 Nocturia: Secondary | ICD-10-CM | POA: Diagnosis not present

## 2020-03-27 DIAGNOSIS — R350 Frequency of micturition: Secondary | ICD-10-CM | POA: Diagnosis not present

## 2020-03-31 MED ORDER — AMOXICILLIN-POT CLAVULANATE 875-125 MG PO TABS
1.0000 | ORAL_TABLET | Freq: Two times a day (BID) | ORAL | 0 refills | Status: AC
Start: 2020-03-31 — End: 2020-04-14

## 2020-03-31 NOTE — Telephone Encounter (Signed)
Returned call from patient's daughter regarding recent urine culture on 9/8. Patient is not having any fevers/chills but is having cloudy, malodorous urine. I sent in an rx for augmentin 875-125mg  BID x 14d per Ucx 9/8 growing pan-sensitive (except Bactrim) E Coli.

## 2020-04-02 ENCOUNTER — Encounter: Payer: Self-pay | Admitting: Neurology

## 2020-04-02 ENCOUNTER — Ambulatory Visit: Payer: Medicare PPO | Admitting: Neurology

## 2020-04-02 ENCOUNTER — Other Ambulatory Visit: Payer: Self-pay

## 2020-04-02 VITALS — BP 150/71 | HR 71 | Ht 70.0 in | Wt 152.0 lb

## 2020-04-02 DIAGNOSIS — G231 Progressive supranuclear ophthalmoplegia [Steele-Richardson-Olszewski]: Secondary | ICD-10-CM

## 2020-04-02 DIAGNOSIS — E871 Hypo-osmolality and hyponatremia: Secondary | ICD-10-CM

## 2020-04-02 DIAGNOSIS — R269 Unspecified abnormalities of gait and mobility: Secondary | ICD-10-CM | POA: Diagnosis not present

## 2020-04-02 NOTE — Progress Notes (Signed)
Reason for visit: PSP, gait disorder  Luis Freeman is an 86 y.o. 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  The patient underwent physical therapy for his walking in the spring 2021, but he continues to fall on a regular basis.  He has also had problems with chronic hyponatremia, he was taken off of his chlorthalidone but is continued to have some issues with this.  The patient has had multiple falls with head trauma, he developed a small parafalcine subdural hematoma noted on an ER visit on 08 Dec 2019.  His most recent CT of the brain done and August showed resolution of the small subdural.  The patient has had problems with urinary frequency, he may get up anywhere from 10-11 times at night to go the bathroom, he may fall at night.  The patient has had 3 recent urinary tract infections, he was seen by urology.  He was placed on Flomax but had what sounds like significant hypotension on the drug.  He had 2 episodes of falls on 4 September and on 7 September with standing.  He would space out and then start getting "bouncy" with the legs and then start collapsing.  Since being off of the Flomax, he has not had any further episodes.  The patient was placed on Keppra when in the hospital for the subdural, he currently is off of Keppra.  He has been seen through neurology at [facility], they agreed with the diagnosis of PSP and the patient is on Sinemet taking the 25/100 mg tablets, 1.5 tablets 3 times daily.  He sleeps a lot during the day as he does not rest well at night.  He may be somewhat fidgety at night, possibly with restless leg syndrome.  Past Medical History:  Diagnosis Date  . Chronic low back pain   . Dementia ([place])   . Gait abnormality 11/15/2019  . Gait disorder   . Hypertension   . PSP (progressive supranuclear palsy) ([place]) 11/15/2019  . Vitamin B12 deficiency      Past Surgical History:  Procedure Laterality Date  . CATARACT EXTRACTION, BILATERAL    . COLONOSCOPY    . COLONOSCOPY  02/04/2011   Procedure: COLONOSCOPY;  Surgeon: Daneil Dolin, MD;  [facility];  Service: Endoscopy;  Laterality: N/A;    Family History  Problem Relation Age of Onset  . Diabetes Mother   . Cancer Father   . Cancer - Lung Brother     Social history:  reports that he has never smoked. He has never used smokeless tobacco. He reports previous alcohol use. He reports that he does not use drugs.   No Known Allergies  Medications:  Prior to Admission medications   Medication Sig Start Date End Date Taking? Authorizing Provider  acetaminophen (TYLENOL) 325 MG tablet Take 2 tablets (650 mg total) by mouth every 6 (six) hours as needed for mild pain (or Fever >/= 101). 02/19/20  Yes Emokpae, Courage, MD  amLODipine (NORVASC) 10 MG tablet Take 1 tablet (10 mg total) by mouth daily. For BP 02/20/20  Yes Emokpae, Courage, MD  amoxicillin-clavulanate (AUGMENTIN) 875-125 MG tablet Take 1 tablet by mouth 2 (two) times daily for 14 days. 03/31/20 04/14/20 Yes Celene Squibb, MD  benazepril (LOTENSIN) 40 MG tablet Take 1 tablet (40 mg total) by mouth in the morning. 02/19/20  Yes Emokpae, Courage,  MD  carbidopa-levodopa (SINEMET IR) 25-100 MG tablet Take 1 tablet by mouth 3 (three) times daily. Patient taking differently: Take 1.5 tablets by mouth 3 (three) times daily.  03/18/20  Yes Kathrynn Ducking, MD  cholecalciferol (VITAMIN D) 1000 units tablet Take 1,000 Units by mouth in the morning.    Yes [provider]  cyanocobalamin (,VITAMIN B-12,) 1000 MCG/ML injection Inject 1,000 mcg into the muscle every 30 (thirty) days.  08/02/15  Yes [provider]  hydrALAZINE (APRESOLINE) 25 MG tablet Take 1.5 tablets (37.5 mg total) by mouth 2 (two) times daily. 02/19/20  Yes Emokpae, Courage, MD  memantine (NAMENDA) 10 MG tablet Take 10 mg by mouth in the  morning.  08/08/19  Yes [provider]  Polyvinyl Alcohol-Povidone PF (REFRESH) 1.4-0.6 % SOLN Apply 1 drop to eye daily as needed (FOR DRY EYE RELIEF).    Yes [provider]    ROS:  Out of a complete 14 system review of symptoms, the patient complains only of the following symptoms, and all other reviewed systems are negative.  Walking difficulty Falls Urinary frequency, bladder infection  Blood pressure (!) 150/71, pulse 71, height 5\' 10"  (1.778 m), weight 152 lb (68.9 kg).   Blood pressure, right arm, sitting is 148/60.  Blood pressure, right arm, standing is 144/64.  Physical Exam  General: The patient is alert and cooperative at the time of the examination.  Skin: No significant peripheral edema is noted.   Neurologic Exam  Mental status: The patient is alert and oriented x 3 at the time of the examination. The patient has apparent normal recent and remote memory, with an apparently normal attention span and concentration ability.   Cranial nerves: Facial symmetry is present. Speech is dysarthric, not aphasic. Extraocular movements are notable for some difficulty with tracking objects in the horizontal plane, the patient has severe restriction of vertical gaze. Visual fields are full.  Masking the face is seen.  Motor: The patient has good strength in all 4 extremities.  Sensory examination: Soft touch sensation is symmetric on the face, arms, and legs.  Coordination: The patient has the ability to perform finger-nose-finger and heel shin bilaterally, but he has significant apraxia with these tasks, particularly involving the left upper extremity.  Gait and station: The patient has the ability to stand up by pushing off on the chair, once up, he can walk with a walker with some shuffling steps.  Tandem gait is not attempted.  Romberg is negative.  Reflexes: Deep tendon reflexes are symmetric.   CT head 02/27/20:  IMPRESSION: No acute  abnormality.  Small left parafalcine subdural hematoma seen on the prior CT is no longer visualized.  * CT scan images were reviewed online. I agree with the written report.    Assessment/Plan:  1.  Progressive supranuclear palsy  2.  Urinary frequency, urinary tract infections  3.  Recent orthostasis on Flomax  4.  Chronic hyponatremia  The patient apparently has had some drop in blood pressure on Flomax, he has done better off the medication.  The patient does not have seizures, his urology doctor is considering the use of Myrbetriq in the future which may be reasonable.  The patient will continue the Sinemet taking 1.5 of the 25/100 mg tablets 3 times daily.  We will check blood work today to look at the sodium level again.  He will follow up here in 4 months.  Jill Alexanders MD 04/02/2020 12:04 PM  [facility] [address] [place], [place] [address]  Phone [phone] Fax [phone]

## 2020-04-03 LAB — COMPREHENSIVE METABOLIC PANEL
ALT: 7 IU/L (ref 0–44)
AST: 13 IU/L (ref 0–40)
Albumin/Globulin Ratio: 2 (ref 1.2–2.2)
Albumin: 4.5 g/dL (ref 3.6–4.6)
Alkaline Phosphatase: 86 IU/L (ref 44–121)
BUN/Creatinine Ratio: 18 (ref 10–24)
BUN: 16 mg/dL (ref 8–27)
Bilirubin Total: 0.3 mg/dL (ref 0.0–1.2)
CO2: 24 mmol/L (ref 20–29)
Calcium: 9.5 mg/dL (ref 8.6–10.2)
Chloride: 96 mmol/L (ref 96–106)
Creatinine, Ser: 0.87 mg/dL (ref 0.76–1.27)
GFR calc Af Amer: 92 mL/min/{1.73_m2} (ref >59)
GFR calc non Af Amer: 79 mL/min/{1.73_m2} (ref >59)
Globulin, Total: 2.3 g/dL (ref 1.5–4.5)
Glucose: 94 mg/dL (ref 65–99)
Potassium: 4.9 mmol/L (ref 3.5–5.2)
Sodium: 135 mmol/L (ref 134–144)
Total Protein: 6.8 g/dL (ref 6.0–8.5)

## 2020-04-17 DIAGNOSIS — Z79899 Other long term (current) drug therapy: Secondary | ICD-10-CM | POA: Diagnosis not present

## 2020-04-17 DIAGNOSIS — G231 Progressive supranuclear ophthalmoplegia [Steele-Richardson-Olszewski]: Secondary | ICD-10-CM | POA: Diagnosis not present

## 2020-04-17 DIAGNOSIS — I10 Essential (primary) hypertension: Secondary | ICD-10-CM | POA: Diagnosis not present

## 2020-04-18 DIAGNOSIS — R350 Frequency of micturition: Secondary | ICD-10-CM | POA: Diagnosis not present

## 2020-04-19 ENCOUNTER — Ambulatory Visit: Payer: Medicare PPO | Admitting: Urology

## 2020-04-23 DIAGNOSIS — S065X0D Traumatic subdural hemorrhage without loss of consciousness, subsequent encounter: Secondary | ICD-10-CM | POA: Diagnosis not present

## 2020-04-24 DIAGNOSIS — G231 Progressive supranuclear ophthalmoplegia [Steele-Richardson-Olszewski]: Secondary | ICD-10-CM | POA: Diagnosis not present

## 2020-04-24 DIAGNOSIS — E871 Hypo-osmolality and hyponatremia: Secondary | ICD-10-CM | POA: Diagnosis not present

## 2020-04-24 DIAGNOSIS — R350 Frequency of micturition: Secondary | ICD-10-CM | POA: Diagnosis not present

## 2020-04-25 DIAGNOSIS — Z9181 History of falling: Secondary | ICD-10-CM | POA: Diagnosis not present

## 2020-04-25 DIAGNOSIS — G20 Parkinson's disease: Secondary | ICD-10-CM | POA: Diagnosis not present

## 2020-04-25 DIAGNOSIS — R1312 Dysphagia, oropharyngeal phase: Secondary | ICD-10-CM | POA: Diagnosis not present

## 2020-04-25 DIAGNOSIS — E538 Deficiency of other specified B group vitamins: Secondary | ICD-10-CM | POA: Diagnosis not present

## 2020-04-25 DIAGNOSIS — F0280 Dementia in other diseases classified elsewhere without behavioral disturbance: Secondary | ICD-10-CM | POA: Diagnosis not present

## 2020-04-25 DIAGNOSIS — G231 Progressive supranuclear ophthalmoplegia [Steele-Richardson-Olszewski]: Secondary | ICD-10-CM | POA: Diagnosis not present

## 2020-04-25 DIAGNOSIS — I10 Essential (primary) hypertension: Secondary | ICD-10-CM | POA: Diagnosis not present

## 2020-04-25 DIAGNOSIS — M545 Low back pain, unspecified: Secondary | ICD-10-CM | POA: Diagnosis not present

## 2020-04-25 DIAGNOSIS — Z87440 Personal history of urinary (tract) infections: Secondary | ICD-10-CM | POA: Diagnosis not present

## 2020-04-30 DIAGNOSIS — G20 Parkinson's disease: Secondary | ICD-10-CM | POA: Diagnosis not present

## 2020-04-30 DIAGNOSIS — R1312 Dysphagia, oropharyngeal phase: Secondary | ICD-10-CM | POA: Diagnosis not present

## 2020-04-30 DIAGNOSIS — M545 Low back pain, unspecified: Secondary | ICD-10-CM | POA: Diagnosis not present

## 2020-04-30 DIAGNOSIS — E538 Deficiency of other specified B group vitamins: Secondary | ICD-10-CM | POA: Diagnosis not present

## 2020-04-30 DIAGNOSIS — I10 Essential (primary) hypertension: Secondary | ICD-10-CM | POA: Diagnosis not present

## 2020-04-30 DIAGNOSIS — G231 Progressive supranuclear ophthalmoplegia [Steele-Richardson-Olszewski]: Secondary | ICD-10-CM | POA: Diagnosis not present

## 2020-04-30 DIAGNOSIS — Z9181 History of falling: Secondary | ICD-10-CM | POA: Diagnosis not present

## 2020-04-30 DIAGNOSIS — Z87440 Personal history of urinary (tract) infections: Secondary | ICD-10-CM | POA: Diagnosis not present

## 2020-04-30 DIAGNOSIS — F0280 Dementia in other diseases classified elsewhere without behavioral disturbance: Secondary | ICD-10-CM | POA: Diagnosis not present

## 2020-05-01 DIAGNOSIS — Z23 Encounter for immunization: Secondary | ICD-10-CM | POA: Diagnosis not present

## 2020-05-03 DIAGNOSIS — M545 Low back pain, unspecified: Secondary | ICD-10-CM | POA: Diagnosis not present

## 2020-05-03 DIAGNOSIS — G20 Parkinson's disease: Secondary | ICD-10-CM | POA: Diagnosis not present

## 2020-05-03 DIAGNOSIS — R1312 Dysphagia, oropharyngeal phase: Secondary | ICD-10-CM | POA: Diagnosis not present

## 2020-05-03 DIAGNOSIS — Z9181 History of falling: Secondary | ICD-10-CM | POA: Diagnosis not present

## 2020-05-03 DIAGNOSIS — F0280 Dementia in other diseases classified elsewhere without behavioral disturbance: Secondary | ICD-10-CM | POA: Diagnosis not present

## 2020-05-03 DIAGNOSIS — G231 Progressive supranuclear ophthalmoplegia [Steele-Richardson-Olszewski]: Secondary | ICD-10-CM | POA: Diagnosis not present

## 2020-05-03 DIAGNOSIS — Z87440 Personal history of urinary (tract) infections: Secondary | ICD-10-CM | POA: Diagnosis not present

## 2020-05-03 DIAGNOSIS — I10 Essential (primary) hypertension: Secondary | ICD-10-CM | POA: Diagnosis not present

## 2020-05-03 DIAGNOSIS — E538 Deficiency of other specified B group vitamins: Secondary | ICD-10-CM | POA: Diagnosis not present

## 2020-05-06 DIAGNOSIS — R351 Nocturia: Secondary | ICD-10-CM | POA: Diagnosis not present

## 2020-05-06 DIAGNOSIS — R3915 Urgency of urination: Secondary | ICD-10-CM | POA: Diagnosis not present

## 2020-05-08 DIAGNOSIS — G231 Progressive supranuclear ophthalmoplegia [Steele-Richardson-Olszewski]: Secondary | ICD-10-CM | POA: Diagnosis not present

## 2020-05-08 DIAGNOSIS — E538 Deficiency of other specified B group vitamins: Secondary | ICD-10-CM | POA: Diagnosis not present

## 2020-05-08 DIAGNOSIS — M545 Low back pain, unspecified: Secondary | ICD-10-CM | POA: Diagnosis not present

## 2020-05-08 DIAGNOSIS — F0280 Dementia in other diseases classified elsewhere without behavioral disturbance: Secondary | ICD-10-CM | POA: Diagnosis not present

## 2020-05-08 DIAGNOSIS — Z9181 History of falling: Secondary | ICD-10-CM | POA: Diagnosis not present

## 2020-05-08 DIAGNOSIS — I10 Essential (primary) hypertension: Secondary | ICD-10-CM | POA: Diagnosis not present

## 2020-05-08 DIAGNOSIS — R1312 Dysphagia, oropharyngeal phase: Secondary | ICD-10-CM | POA: Diagnosis not present

## 2020-05-08 DIAGNOSIS — G20 Parkinson's disease: Secondary | ICD-10-CM | POA: Diagnosis not present

## 2020-05-08 DIAGNOSIS — Z87440 Personal history of urinary (tract) infections: Secondary | ICD-10-CM | POA: Diagnosis not present

## 2020-05-14 DIAGNOSIS — D519 Vitamin B12 deficiency anemia, unspecified: Secondary | ICD-10-CM | POA: Diagnosis not present

## 2020-05-16 ENCOUNTER — Other Ambulatory Visit: Payer: Self-pay | Admitting: Neurology

## 2020-05-16 DIAGNOSIS — F0280 Dementia in other diseases classified elsewhere without behavioral disturbance: Secondary | ICD-10-CM | POA: Diagnosis not present

## 2020-05-16 DIAGNOSIS — Z9181 History of falling: Secondary | ICD-10-CM | POA: Diagnosis not present

## 2020-05-16 DIAGNOSIS — G231 Progressive supranuclear ophthalmoplegia [Steele-Richardson-Olszewski]: Secondary | ICD-10-CM | POA: Diagnosis not present

## 2020-05-16 DIAGNOSIS — M545 Low back pain, unspecified: Secondary | ICD-10-CM | POA: Diagnosis not present

## 2020-05-16 DIAGNOSIS — R1312 Dysphagia, oropharyngeal phase: Secondary | ICD-10-CM | POA: Diagnosis not present

## 2020-05-16 DIAGNOSIS — G20 Parkinson's disease: Secondary | ICD-10-CM | POA: Diagnosis not present

## 2020-05-16 DIAGNOSIS — E538 Deficiency of other specified B group vitamins: Secondary | ICD-10-CM | POA: Diagnosis not present

## 2020-05-16 DIAGNOSIS — I10 Essential (primary) hypertension: Secondary | ICD-10-CM | POA: Diagnosis not present

## 2020-05-16 DIAGNOSIS — Z87440 Personal history of urinary (tract) infections: Secondary | ICD-10-CM | POA: Diagnosis not present

## 2020-05-16 NOTE — Telephone Encounter (Signed)
Patient is requesting refill of Carbidopa.

## 2020-05-21 DIAGNOSIS — I10 Essential (primary) hypertension: Secondary | ICD-10-CM | POA: Diagnosis not present

## 2020-05-21 DIAGNOSIS — E871 Hypo-osmolality and hyponatremia: Secondary | ICD-10-CM | POA: Diagnosis not present

## 2020-05-21 DIAGNOSIS — Z79899 Other long term (current) drug therapy: Secondary | ICD-10-CM | POA: Diagnosis not present

## 2020-05-22 DIAGNOSIS — E538 Deficiency of other specified B group vitamins: Secondary | ICD-10-CM | POA: Diagnosis not present

## 2020-05-22 DIAGNOSIS — Z9181 History of falling: Secondary | ICD-10-CM | POA: Diagnosis not present

## 2020-05-22 DIAGNOSIS — R1312 Dysphagia, oropharyngeal phase: Secondary | ICD-10-CM | POA: Diagnosis not present

## 2020-05-22 DIAGNOSIS — F0280 Dementia in other diseases classified elsewhere without behavioral disturbance: Secondary | ICD-10-CM | POA: Diagnosis not present

## 2020-05-22 DIAGNOSIS — G20 Parkinson's disease: Secondary | ICD-10-CM | POA: Diagnosis not present

## 2020-05-22 DIAGNOSIS — I10 Essential (primary) hypertension: Secondary | ICD-10-CM | POA: Diagnosis not present

## 2020-05-22 DIAGNOSIS — M545 Low back pain, unspecified: Secondary | ICD-10-CM | POA: Diagnosis not present

## 2020-05-22 DIAGNOSIS — Z87440 Personal history of urinary (tract) infections: Secondary | ICD-10-CM | POA: Diagnosis not present

## 2020-05-22 DIAGNOSIS — G231 Progressive supranuclear ophthalmoplegia [Steele-Richardson-Olszewski]: Secondary | ICD-10-CM | POA: Diagnosis not present

## 2020-05-24 DIAGNOSIS — S065X0D Traumatic subdural hemorrhage without loss of consciousness, subsequent encounter: Secondary | ICD-10-CM | POA: Diagnosis not present

## 2020-05-25 DIAGNOSIS — Z9181 History of falling: Secondary | ICD-10-CM | POA: Diagnosis not present

## 2020-05-25 DIAGNOSIS — R1312 Dysphagia, oropharyngeal phase: Secondary | ICD-10-CM | POA: Diagnosis not present

## 2020-05-25 DIAGNOSIS — I10 Essential (primary) hypertension: Secondary | ICD-10-CM | POA: Diagnosis not present

## 2020-05-25 DIAGNOSIS — G231 Progressive supranuclear ophthalmoplegia [Steele-Richardson-Olszewski]: Secondary | ICD-10-CM | POA: Diagnosis not present

## 2020-05-25 DIAGNOSIS — M545 Low back pain, unspecified: Secondary | ICD-10-CM | POA: Diagnosis not present

## 2020-05-25 DIAGNOSIS — Z87440 Personal history of urinary (tract) infections: Secondary | ICD-10-CM | POA: Diagnosis not present

## 2020-05-25 DIAGNOSIS — E538 Deficiency of other specified B group vitamins: Secondary | ICD-10-CM | POA: Diagnosis not present

## 2020-05-25 DIAGNOSIS — G20 Parkinson's disease: Secondary | ICD-10-CM | POA: Diagnosis not present

## 2020-05-25 DIAGNOSIS — F0280 Dementia in other diseases classified elsewhere without behavioral disturbance: Secondary | ICD-10-CM | POA: Diagnosis not present

## 2020-05-28 DIAGNOSIS — E871 Hypo-osmolality and hyponatremia: Secondary | ICD-10-CM | POA: Diagnosis not present

## 2020-05-28 DIAGNOSIS — I10 Essential (primary) hypertension: Secondary | ICD-10-CM | POA: Diagnosis not present

## 2020-06-04 DIAGNOSIS — I10 Essential (primary) hypertension: Secondary | ICD-10-CM | POA: Diagnosis not present

## 2020-06-04 DIAGNOSIS — R1312 Dysphagia, oropharyngeal phase: Secondary | ICD-10-CM | POA: Diagnosis not present

## 2020-06-04 DIAGNOSIS — M545 Low back pain, unspecified: Secondary | ICD-10-CM | POA: Diagnosis not present

## 2020-06-04 DIAGNOSIS — G20 Parkinson's disease: Secondary | ICD-10-CM | POA: Diagnosis not present

## 2020-06-04 DIAGNOSIS — Z9181 History of falling: Secondary | ICD-10-CM | POA: Diagnosis not present

## 2020-06-04 DIAGNOSIS — Z87440 Personal history of urinary (tract) infections: Secondary | ICD-10-CM | POA: Diagnosis not present

## 2020-06-04 DIAGNOSIS — F0280 Dementia in other diseases classified elsewhere without behavioral disturbance: Secondary | ICD-10-CM | POA: Diagnosis not present

## 2020-06-04 DIAGNOSIS — G231 Progressive supranuclear ophthalmoplegia [Steele-Richardson-Olszewski]: Secondary | ICD-10-CM | POA: Diagnosis not present

## 2020-06-04 DIAGNOSIS — E538 Deficiency of other specified B group vitamins: Secondary | ICD-10-CM | POA: Diagnosis not present

## 2020-06-17 DIAGNOSIS — H903 Sensorineural hearing loss, bilateral: Secondary | ICD-10-CM | POA: Diagnosis not present

## 2020-06-18 DIAGNOSIS — F0280 Dementia in other diseases classified elsewhere without behavioral disturbance: Secondary | ICD-10-CM | POA: Diagnosis not present

## 2020-06-18 DIAGNOSIS — Z87440 Personal history of urinary (tract) infections: Secondary | ICD-10-CM | POA: Diagnosis not present

## 2020-06-18 DIAGNOSIS — G231 Progressive supranuclear ophthalmoplegia [Steele-Richardson-Olszewski]: Secondary | ICD-10-CM | POA: Diagnosis not present

## 2020-06-18 DIAGNOSIS — G20 Parkinson's disease: Secondary | ICD-10-CM | POA: Diagnosis not present

## 2020-06-18 DIAGNOSIS — F5221 Male erectile disorder: Secondary | ICD-10-CM | POA: Diagnosis not present

## 2020-06-18 DIAGNOSIS — E538 Deficiency of other specified B group vitamins: Secondary | ICD-10-CM | POA: Diagnosis not present

## 2020-06-18 DIAGNOSIS — D519 Vitamin B12 deficiency anemia, unspecified: Secondary | ICD-10-CM | POA: Diagnosis not present

## 2020-06-18 DIAGNOSIS — R1312 Dysphagia, oropharyngeal phase: Secondary | ICD-10-CM | POA: Diagnosis not present

## 2020-06-18 DIAGNOSIS — M545 Low back pain, unspecified: Secondary | ICD-10-CM | POA: Diagnosis not present

## 2020-06-18 DIAGNOSIS — Z9181 History of falling: Secondary | ICD-10-CM | POA: Diagnosis not present

## 2020-06-18 DIAGNOSIS — R7989 Other specified abnormal findings of blood chemistry: Secondary | ICD-10-CM | POA: Diagnosis not present

## 2020-06-18 DIAGNOSIS — I10 Essential (primary) hypertension: Secondary | ICD-10-CM | POA: Diagnosis not present

## 2020-06-20 ENCOUNTER — Encounter (HOSPITAL_COMMUNITY): Payer: Self-pay | Admitting: Speech Pathology

## 2020-06-20 ENCOUNTER — Other Ambulatory Visit: Payer: Self-pay

## 2020-06-20 ENCOUNTER — Ambulatory Visit (HOSPITAL_COMMUNITY): Payer: Medicare PPO | Attending: Neurology | Admitting: Speech Pathology

## 2020-06-20 DIAGNOSIS — R41841 Cognitive communication deficit: Secondary | ICD-10-CM | POA: Insufficient documentation

## 2020-06-20 DIAGNOSIS — R471 Dysarthria and anarthria: Secondary | ICD-10-CM | POA: Diagnosis not present

## 2020-06-20 NOTE — Therapy (Signed)
[place], [place], [address] Phone: [phone]   Fax:  [phone]  Speech Language Pathology Evaluation  Patient Details  Name: Luis Freeman MRN: [id number] Date of Birth: [birth year] Referring Provider (SLP): Regis Bill, MD   Encounter Date: 06/20/2020   End of Session - 06/20/20 1224    Visit Number 1    Number of Visits 13    Date for SLP Re-Evaluation 08/08/20    Authorization Type [employer]   no deductible, $20 copay, OOP max $4000, no visit limit, auth required   SLP Start Time 0910    SLP Stop Time  1015    SLP Time Calculation (min) 65 min    Activity Tolerance Patient tolerated treatment well           Past Medical History:  Diagnosis Date  . Chronic low back pain   . Dementia ([place])   . Gait abnormality 11/15/2019  . Gait disorder   . Hypertension   . PSP (progressive supranuclear palsy) ([place]) 11/15/2019  . Vitamin B12 deficiency     Past Surgical History:  Procedure Laterality Date  . CATARACT EXTRACTION, BILATERAL    . COLONOSCOPY    . COLONOSCOPY  02/04/2011   Procedure: COLONOSCOPY;  Surgeon: Daneil Dolin, MD;  [facility];  Service: Endoscopy;  Laterality: N/A;    There were no vitals filed for this visit.   Subjective Assessment - 06/20/20 1211    Subjective "He starts to say something and doesn't finish it." -[name] wife, Luis Freeman    Patient is accompained by: Family member   wife Luis Freeman, daughter Luis Freeman   Currently in Pain? No/denies              SLP Evaluation [facility] - 06/20/20 1211      SLP Visit Information   SLP Received On 06/20/20    Referring Provider (SLP) Regis Bill, MD    Onset Date 09/29/2019    Medical Diagnosis Progressive Supranuclear Palsy (PSP)      Subjective   Patient/Family Stated Goal Improve communication      General Information   HPI Luis Freeman is an 86 y.o. male with a past medical history Cancer (CMS-HCC),  cataract surgery, and Hypertension who is referred by Dr. Regis Bill for evaluation of voice, speech, and cognitive screening secondary to diagnosis of progressive supranuclear palsy. Per patient's family, symptoms started 2 years ago. He came under the care of Dr. Regis Bill at [facility] neurology in January of 2021 and was diagnosed with PSP-CBD. Patient's family complains of difficulty understanding him. He denies any swallowing difficulty. Patient lives with his wife Luis Freeman and receives 24-hour care 7 days a week. Patient has experienced several falls in the past year, most recently on July 29 with head injury requiring ED visit. Mr. Luis Freeman developed a small parafalcine subdural hematoma noted on an ER visit on 08 Dec 2019.  His most recent CT of the brain done and August showed resolution of the small subdural. Pt is known to this SLP from previous treatment for PSP (10/04/19-11/22/2019).    Behavioral/Cognition Alert, cooperative    Mobility Status ambulatory      Balance Screen   Has the patient fallen in the past 6 months Yes    How many times? 5    Has the patient had a decrease in activity level because of a fear of falling?  Yes    Is the patient reluctant  to leave their home because of a fear of falling?  Yes      Prior Functional Status   Cognitive/Linguistic Baseline Baseline deficits    Baseline deficit details cognitive deficits in PSP    Type of Home House     Lives With Family    Available Support Family    Vocation Retired      Associate Professor   Overall Cognitive Status Impaired/Different from baseline    Area of Impairment Attention;Memory;Following commands    Current Attention Level Sustained    Memory Decreased short-term memory    Following Commands Follows one step commands inconsistently   needs extra time, gestural cues   Memory Impaired    Problem Solving Impaired    Executive Function Decision Making;Sequencing;Self Monitoring    Sequencing Impaired    Decision Making  Impaired    Self Monitoring Impaired    Behaviors Perseveration      Auditory Comprehension   Overall Auditory Comprehension Impaired    Yes/No Questions Impaired    Basic Immediate Environment Questions 75-100% accurate    Commands Impaired    One Step Basic Commands 50-74% accurate    Conversation Simple    Interfering Components Attention;Processing speed;Working Water engineer words;Visual/Gestural cues;Repetition;Extra processing time      Visual Recognition/Discrimination   Discrimination Within Function Limits      Reading Comprehension   Reading Status --   [facility] for short phrases, oral reading     Expression   Primary Mode of Expression Verbal      Verbal Expression   Overall Verbal Expression Impaired    Initiation Impaired    Automatic Speech Name;Social Response;Counting    Level of Generative/Spontaneous Verbalization Phrase    Repetition Impaired    Level of Impairment Word level    Naming No impairment    Pragmatics No impairment    Interfering Components Attention;Speech intelligibility    Effective Techniques Written cues   binary choice   Non-Verbal Means of Communication Not applicable      Written Expression   Dominant Hand Right    Written Expression Not tested      Oral Motor/Sensory Function   Overall Oral Motor/Sensory Function Impaired    Labial ROM Within Functional Limits    Labial Symmetry Within Functional Limits    Labial Strength Within Functional Limits    Labial Sensation Within Functional Limits    Labial Coordination Reduced    Lingual ROM Within Functional Limits    Lingual Symmetry Within Functional Limits    Lingual Strength Within Functional Limits    Lingual Coordination Reduced    Facial ROM Within Functional Limits    Facial Strength Within Functional Limits    Facial Sensation Within Functional Limits    Facial Coordination Reduced    Velum Within Functional Limits    Mandible Within  Functional Limits      Motor Speech   Overall Motor Speech Impaired    Respiration Impaired    Level of Impairment Word    Phonation Breathy;Low vocal intensity    Resonance Within functional limits    Articulation Impaired    Level of Impairment Word    Intelligibility Intelligibility reduced    Word 50-74% accurate    Phrase 50-74% accurate    Sentence 50-74% accurate    Conversation 50-74% accurate    Motor Planning Impaired    Motor Speech Errors Aware;Unaware    Effective Techniques Increased vocal intensity  Phonation Impaired    Volume Soft;Decibel Level   61 dB sentences   Pitch Appropriate             SLP Education - 06/20/20 1223    Education Details Plan for treatment via [place]!    Person(s) Educated Patient;Spouse;Child(ren)    Methods Explanation    Comprehension Verbalized understanding            SLP Short Term Goals - 06/20/20 1235      SLP SHORT TERM GOAL #1   Title Pt will coordinate vocal and articulatory subsystems in hierarchical speech tasks by producing sounds with intention with mod assistance.    Baseline max assist    Time 6    Period Weeks    Status New    Target Date 08/08/20      SLP SHORT TERM GOAL #2   Title Read phrases, sentences, and paragraphs with intention, yielding improved vocal quality, loudness, articulatory precision, and endurance while maintaining a minimum of 75 dB with min assistance.    Baseline 55-62 dB    Time 6    Period Weeks    Status New    Target Date 08/08/20      SLP SHORT TERM GOAL #3   Title Generalize intentional speech to cognitive-linguistic exercises and conversational speech with improved vocal quality, loudness, articulatory precision, and endurance while maintaining a minimum of 75 dB with min assistance.    Baseline max assist for ~58 dB    Time 6    Period Weeks    Status New    Target Date 08/08/20            SLP Long Term Goals - 06/20/20 1245      SLP LONG TERM GOAL #1    Title Pt will be able to communicate basic wants and needs with use of intentional speech 80% of the time with mi/mod cues from conversation partner.    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 Speech intelligibility is judged to be ~65% intelligible, but does fluctuate greatly. Pt also presents with moderate cognitive linguistic deficits characterized by impaired attention, memory, and thought organization negatively impacting independence with communication. Recommend SLP treatment to target dysarthria specifically, as this is Pt and family's primary goal at this time.   Speech Therapy Frequency 2x / week    Duration --   6 weeks   Treatment/Interventions Compensatory techniques;Functional tasks;Multimodal communcation approach;Cueing hierarchy;SLP instruction and feedback;Patient/family education;Compensatory strategies    Potential to Achieve Goals Fair    Potential Considerations Ability to learn/carryover information;Severity of impairments    SLP Home Exercise Plan Pt will complete HEP as assigned to facilitate carryover of treatment strategies and techniques in home environment with assist from caregivers    Consulted and Agree with Plan of Care Patient;Family member/caregiver    Family Member Consulted wife, daughter           Patient will benefit from skilled therapeutic intervention in order to improve the following deficits and impairments:   Cognitive communication deficit  Dysarthria and anarthria    Problem List Patient Active Problem List   Diagnosis Date Noted  . Acute  metabolic encephalopathy 49/96/9249  . Acute on Chronic HypoNatremia 02/21/2020  . HTN (hypertension) 02/16/2020  . SDH  (subdural hematoma) ([place]) 02/15/2020  . PSP (progressive supranuclear palsy) ([place]) 11/15/2019  . Gait abnormality 11/15/2019  . Movement disorder 05/18/2019  . Dementia [facility]) 11/10/2018   Thank you,  Genene Churn, [place]  [facility] 06/20/2020, 12:51 PM  [place] [address] [place], [place], [address] Phone: [phone]   Fax:  [phone]  Name: Luis Freeman MRN: [id number] Date of Birth: 12-Apr-1933

## 2020-06-23 DIAGNOSIS — S065X0D Traumatic subdural hemorrhage without loss of consciousness, subsequent encounter: Secondary | ICD-10-CM | POA: Diagnosis not present

## 2020-06-26 ENCOUNTER — Encounter (HOSPITAL_COMMUNITY): Payer: Self-pay | Admitting: Speech Pathology

## 2020-06-26 ENCOUNTER — Other Ambulatory Visit: Payer: Self-pay

## 2020-06-26 ENCOUNTER — Ambulatory Visit (HOSPITAL_COMMUNITY): Payer: Medicare PPO | Admitting: Speech Pathology

## 2020-06-26 DIAGNOSIS — R471 Dysarthria and anarthria: Secondary | ICD-10-CM

## 2020-06-26 DIAGNOSIS — R41841 Cognitive communication deficit: Secondary | ICD-10-CM | POA: Diagnosis not present

## 2020-06-26 NOTE — Therapy (Signed)
Luis Freeman, [place], [address] Phone: [phone]   Fax:  [phone]  Speech Language Pathology Treatment  Patient Details  Name: Luis Freeman MRN: [id number] Date of Birth: 01-14-1933 Referring Provider (SLP): Regis Bill, MD   Encounter Date: 06/26/2020   End of Session - 06/26/20 1150    Visit Number 2    Number of Visits 13    Date for SLP Re-Evaluation 08/08/20    Authorization Type [employer]   no deductible, $20 copay, OOP max $4000, no visit limit, auth required   SLP Start Time 1030    SLP Stop Time  1115    SLP Time Calculation (min) 45 min    Activity Tolerance Patient tolerated treatment well           Past Medical History:  Diagnosis Date  . Chronic low back pain   . Dementia ([place])   . Gait abnormality 11/15/2019  . Gait disorder   . Hypertension   . PSP (progressive supranuclear palsy) ([place]) 11/15/2019  . Vitamin B12 deficiency     Past Surgical History:  Procedure Laterality Date  . CATARACT EXTRACTION, BILATERAL    . COLONOSCOPY    . COLONOSCOPY  02/04/2011   Procedure: COLONOSCOPY;  Surgeon: Daneil Dolin, MD;  [facility];  Service: Endoscopy;  Laterality: N/A;    There were no vitals filed for this visit.   Subjective Assessment - 06/26/20 1142    Subjective "Good to see you."    Patient is accompained by: Family member    Currently in Pain? No/denies            ADULT SLP TREATMENT - 06/26/20 1144      General Information   Behavior/Cognition Alert;Cooperative;Pleasant mood    Patient Positioning Upright in chair    Oral care provided N/A    HPI Luis Freeman is an 86 y.o. male with a past medical history Cancer (CMS-HCC), cataract surgery, and Hypertension who is referred by Dr. Regis Bill for evaluation of voice, speech, and cognitive screening secondary to diagnosis of progressive supranuclear palsy. Per patient's family, symptoms started 2  years ago. He came under the care of Dr. Regis Bill at [facility] neurology in January of 2021 and was diagnosed with PSP-CBD. Patient's family complains of difficulty understanding him. He denies any swallowing difficulty. Patient lives with his wife Luis Freeman and receives 24-hour care 7 days a week. Patient has experienced several falls in the past year, most recently on July 29 with head injury requiring ED visit. Mr. Ludtke developed a small parafalcine subdural hematoma noted on an ER visit on 08 Dec 2019.  His most recent CT of the brain done and August showed resolution of the small subdural. Pt is known to this SLP from previous treatment for PSP (10/04/19-11/22/2019).      Treatment Provided   Treatment provided Cognitive-Linquistic      Pain Assessment   Pain Assessment No/denies pain      Cognitive-Linquistic Treatment   Treatment focused on Dysarthria;Patient/family/caregiver education;Voice    Skilled Treatment SPEAKOUT! lesson and pre-treatment video      Assessment / Recommendations / Plan   Plan Continue with current plan of care      Progression Toward Goals   Progression toward goals Progressing toward goals            SLP Education - 06/26/20 1145    Education Details workbook ordered in session, copied  first lesson for Pt to review at home    Person(s) Educated Patient;Spouse;Caregiver(s)   [name], caregiver   Methods Explanation;Handout    Comprehension Verbalized understanding;Need further instruction            SLP Short Term Goals - 06/26/20 1153      SLP SHORT TERM GOAL #1   Title Pt will coordinate vocal and articulatory subsystems in hierarchical speech tasks by producing sounds with intention with mod assistance.    Baseline max assist    Time 6    Period Weeks    Status On-going    Target Date 08/08/20      SLP SHORT TERM GOAL #2   Title Read phrases, sentences, and paragraphs with intention, yielding improved vocal quality, loudness, articulatory  precision, and endurance while maintaining a minimum of 75 dB with min assistance.    Baseline 55-62 dB    Time 6    Period Weeks    Status On-going    Target Date 08/08/20      SLP SHORT TERM GOAL #3   Title Generalize intentional speech to cognitive-linguistic exercises and conversational speech with improved vocal quality, loudness, articulatory precision, and endurance while maintaining a minimum of 75 dB with min assistance.    Baseline max assist for ~58 dB    Time 6    Period Weeks    Status On-going    Target Date 08/08/20            SLP Long Term Goals - 06/26/20 1153      SLP LONG TERM GOAL #1   Title Pt will be able to communicate basic wants and needs with use of intentional speech 80% of the time with mi/mod cues from conversation partner.    Baseline max assist    Time 6    Period Weeks    Status On-going            Plan - 06/26/20 1151    Clinical Impression Statement Pt accompanied to therapy by his wife and his caregiver, [name]. The Speakout! pre-treatment interview was completed this session as well as other baseline measures. We called to order the workbook at the end of the session. Baseline measures today: conversation 62 dB, paragraph reading 67 dB, sustained "ah" 70 dB. Pt increased to 76 dB for sustained "ah" after cues for loudness and intent; repeating phrases with 79 dB. Pt required max cues for completion of tasks as he demonstrates echolalia. He benefited from choral cues, however when SLP slowly fades voicing, Pt does the same. Use of written cues and repetition of tasks proved beneficial. Will complete Lesson 1 next session, however copy of this lesson was sent home with Pt to review. Caregiver, [name], agreeable to practice at home with Pt.    Speech Therapy Frequency 2x / week    Duration --   6 weeks   Treatment/Interventions Compensatory techniques;Functional tasks;Multimodal communcation approach;Cueing hierarchy;SLP instruction and  feedback;Patient/family education;Compensatory strategies    Potential to Achieve Goals Fair    Potential Considerations Ability to learn/carryover information;Severity of impairments    SLP Home Exercise Plan Pt will complete HEP as assigned to facilitate carryover of treatment strategies and techniques in home environment with assist from caregivers    Consulted and Agree with Plan of Care Patient;Family member/caregiver    Family Member Consulted wife, daughter           Patient will benefit from skilled therapeutic intervention in order to improve the following  deficits and impairments:   Dysarthria and anarthria    Problem List Patient Active Problem List   Diagnosis Date Noted  . Acute metabolic encephalopathy 12/54/8323  . Acute on Chronic HypoNatremia 02/21/2020  . HTN (hypertension) 02/16/2020  . SDH (subdural hematoma) ([place]) 02/15/2020  . PSP (progressive supranuclear palsy) ([place]) 11/15/2019  . Gait abnormality 11/15/2019  . Movement disorder 05/18/2019  . Dementia [facility]) 11/10/2018   Thank you,  Genene Churn, [place]  [facility] 06/26/2020, 11:54 AM  [place] [address] [place], [place], [address] Phone: [phone]   Fax:  [phone]   Name: ALDAHIR LITAKER MRN: [id number] Date of Birth: [birth year]

## 2020-06-27 DIAGNOSIS — E871 Hypo-osmolality and hyponatremia: Secondary | ICD-10-CM | POA: Diagnosis not present

## 2020-06-27 DIAGNOSIS — H43813 Vitreous degeneration, bilateral: Secondary | ICD-10-CM | POA: Diagnosis not present

## 2020-06-27 DIAGNOSIS — H0102B Squamous blepharitis left eye, upper and lower eyelids: Secondary | ICD-10-CM | POA: Diagnosis not present

## 2020-06-27 DIAGNOSIS — H0102A Squamous blepharitis right eye, upper and lower eyelids: Secondary | ICD-10-CM | POA: Diagnosis not present

## 2020-06-27 DIAGNOSIS — H26491 Other secondary cataract, right eye: Secondary | ICD-10-CM | POA: Diagnosis not present

## 2020-06-27 DIAGNOSIS — G231 Progressive supranuclear ophthalmoplegia [Steele-Richardson-Olszewski]: Secondary | ICD-10-CM | POA: Diagnosis not present

## 2020-06-27 DIAGNOSIS — H04123 Dry eye syndrome of bilateral lacrimal glands: Secondary | ICD-10-CM | POA: Diagnosis not present

## 2020-06-27 DIAGNOSIS — Z961 Presence of intraocular lens: Secondary | ICD-10-CM | POA: Diagnosis not present

## 2020-07-01 ENCOUNTER — Encounter (HOSPITAL_COMMUNITY): Payer: Self-pay | Admitting: Speech Pathology

## 2020-07-01 ENCOUNTER — Ambulatory Visit (HOSPITAL_COMMUNITY): Payer: Medicare PPO | Admitting: Speech Pathology

## 2020-07-01 ENCOUNTER — Other Ambulatory Visit: Payer: Self-pay

## 2020-07-01 DIAGNOSIS — R471 Dysarthria and anarthria: Secondary | ICD-10-CM

## 2020-07-01 DIAGNOSIS — R41841 Cognitive communication deficit: Secondary | ICD-10-CM | POA: Diagnosis not present

## 2020-07-01 NOTE — Therapy (Signed)
[place], [place], [address] Phone: [phone]   Fax:  [phone]  Speech Language Pathology Treatment  Patient Details  Name: Luis Freeman MRN: [id number] Date of Birth: [birth year] Referring Provider (SLP): Regis Bill, MD   Encounter Date: 07/01/2020   End of Session - 07/01/20 1437    Visit Number 3    Number of Visits 13    Date for SLP Re-Evaluation 08/08/20    Authorization Type [employer]   no deductible, $20 copay, OOP max $4000, no visit limit, auth required   SLP Start Time 1030    SLP Stop Time  1115    SLP Time Calculation (min) 45 min    Activity Tolerance Patient tolerated treatment well           Past Medical History:  Diagnosis Date  . Chronic low back pain   . Dementia ([place])   . Gait abnormality 11/15/2019  . Gait disorder   . Hypertension   . PSP (progressive supranuclear palsy) ([place]) 11/15/2019  . Vitamin B12 deficiency     Past Surgical History:  Procedure Laterality Date  . CATARACT EXTRACTION, BILATERAL    . COLONOSCOPY    . COLONOSCOPY  02/04/2011   Procedure: COLONOSCOPY;  Surgeon: Daneil Dolin, MD;  [facility];  Service: Endoscopy;  Laterality: N/A;    There were no vitals filed for this visit.   Subjective Assessment - 07/01/20 1040    Subjective "Good morning!"    Patient is accompained by: Family member    Currently in Pain? No/denies                 ADULT SLP TREATMENT - 07/01/20 1040      General Information   Behavior/Cognition Alert;Cooperative;Pleasant mood    Patient Positioning Upright in chair    Oral care provided N/A    HPI Luis Freeman is an 86 y.o. male with a past medical history Cancer (CMS-HCC), cataract surgery, and Hypertension who is referred by Dr. Regis Bill for evaluation of voice, speech, and cognitive screening secondary to diagnosis of progressive supranuclear palsy. Per patient's family, symptoms  started 2 years ago. He came under the care of Dr. Regis Bill at [facility] neurology in January of 2021 and was diagnosed with PSP-CBD. Patient's family complains of difficulty understanding him. He denies any swallowing difficulty. Patient lives with his wife [name] and receives 24-hour care 7 days a week. Patient has experienced several falls in the past year, most recently on July 29 with head injury requiring ED visit. Luis Freeman developed a small parafalcine subdural hematoma noted on an ER visit on 08 Dec 2019.  His most recent CT of the brain done and August showed resolution of the small subdural. Pt is known to this SLP from previous treatment for PSP (10/04/19-11/22/2019).      Treatment Provided   Treatment provided Cognitive-Linquistic      Pain Assessment   Pain Assessment No/denies pain      Cognitive-Linquistic Treatment   Treatment focused on Dysarthria;Patient/family/caregiver education;Voice    Skilled Treatment SPEAKOUT! lesson and pre-treatment video      Assessment / Recommendations / [place] with current plan of care      Progression Toward Goals   Progression toward goals Progressing toward goals            SLP Education - 07/01/20 1437    Education Details Completed Lesson  1 in session, continue with the same for homework    Person(s) Educated Patient;Spouse;Caregiver(s)    Methods Explanation;Handout    Comprehension Verbalized understanding            SLP Short Term Goals - 07/01/20 1440      SLP SHORT TERM GOAL #1   Title Pt will coordinate vocal and articulatory subsystems in hierarchical speech tasks by producing sounds with intention with mod assistance.    Baseline max assist    Time 6    Period Weeks    Status On-going    Target Date 08/08/20      SLP SHORT TERM GOAL #2   Title Read phrases, sentences, and paragraphs with intention, yielding improved vocal quality, loudness, articulatory precision, and endurance while maintaining a  minimum of 75 dB with min assistance.    Baseline 55-62 dB    Time 6    Period Weeks    Status On-going    Target Date 08/08/20      SLP SHORT TERM GOAL #3   Title Generalize intentional speech to cognitive-linguistic exercises and conversational speech with improved vocal quality, loudness, articulatory precision, and endurance while maintaining a minimum of 75 dB with min assistance.    Baseline max assist for ~58 dB    Time 6    Period Weeks    Status On-going    Target Date 08/08/20            SLP Long Term Goals - 07/01/20 1441      SLP LONG TERM GOAL #1   Title Pt will be able to communicate basic wants and needs with use of intentional speech 80% of the time with mi/mod cues from conversation partner.    Baseline max assist    Time 6    Period Weeks    Status On-going            Plan - 07/01/20 1440    Clinical Impression Statement Pt accompanied to therapy by his wife and his caregiver, [name]. They reported practicing with exercises over the weekend. SLP faciliated session through implementation of [place] [facility] the [facility]! workbook. Luis Freeman produced the following: conversation 62 dB, warm up 68 dB, /a/ for 5 seconds at 84 dB, vocal glides 82 dB, numerical sequences 63 dB, reading phrases 62 dB, and cognitive task 60 dB. He has not yet received his workbook, but the first lesson was copied for him to practice at home. He required mod/max cues for each task, however he did demonstrate some carryover from previous session and likely from home program with sustained "ah" task (increased from 61 dB to 84 dB). He has a difficult time completing oral reading tasks without SLP model in order to increase intent. Plan to complete lesson 2 next session.   Speech Therapy Frequency 2x / week    Duration --   6 weeks   Treatment/Interventions Compensatory techniques;Functional tasks;Multimodal communcation approach;Cueing hierarchy;SLP instruction and feedback;Patient/family  education;Compensatory strategies    Potential to Achieve Goals Fair    Potential Considerations Ability to learn/carryover information;Severity of impairments    SLP Home Exercise Plan Pt will complete HEP as assigned to facilitate carryover of treatment strategies and techniques in home environment with assist from caregivers    Consulted and Agree with Plan of Care Patient;Family member/caregiver    Family Member Consulted wife, daughter           Patient will benefit from skilled therapeutic intervention in order to  improve the following deficits and impairments:   Dysarthria and anarthria    Problem List Patient Active Problem List   Diagnosis Date Noted  . Acute metabolic encephalopathy 82/02/1387  . Acute on Chronic HypoNatremia 02/21/2020  . HTN (hypertension) 02/16/2020  . SDH (subdural hematoma) ([place]) 02/15/2020  . PSP (progressive supranuclear palsy) ([place]) 11/15/2019  . Gait abnormality 11/15/2019  . Movement disorder 05/18/2019  . Dementia [facility]) 11/10/2018   Thank you,  Genene Churn, [place]  [facility] 07/01/2020, 2:41 PM  [place] [address] [place], [place], [address] Phone: [phone]   Fax:  [phone]   Name: HANS RUSHER MRN: [id number] Date of Birth: 06-16-1933

## 2020-07-02 DIAGNOSIS — G231 Progressive supranuclear ophthalmoplegia [Steele-Richardson-Olszewski]: Secondary | ICD-10-CM | POA: Diagnosis not present

## 2020-07-02 DIAGNOSIS — R1312 Dysphagia, oropharyngeal phase: Secondary | ICD-10-CM | POA: Diagnosis not present

## 2020-07-02 DIAGNOSIS — R1310 Dysphagia, unspecified: Secondary | ICD-10-CM | POA: Diagnosis not present

## 2020-07-02 DIAGNOSIS — R490 Dysphonia: Secondary | ICD-10-CM | POA: Diagnosis not present

## 2020-07-03 ENCOUNTER — Ambulatory Visit (HOSPITAL_COMMUNITY): Payer: Medicare PPO | Admitting: Speech Pathology

## 2020-07-03 ENCOUNTER — Other Ambulatory Visit: Payer: Self-pay

## 2020-07-03 ENCOUNTER — Encounter (HOSPITAL_COMMUNITY): Payer: Self-pay | Admitting: Speech Pathology

## 2020-07-03 DIAGNOSIS — R41841 Cognitive communication deficit: Secondary | ICD-10-CM | POA: Diagnosis not present

## 2020-07-03 DIAGNOSIS — R471 Dysarthria and anarthria: Secondary | ICD-10-CM | POA: Diagnosis not present

## 2020-07-03 NOTE — Therapy (Signed)
[place], [place], [address] Phone: [phone]   Fax:  [phone]  Speech Language Pathology Treatment  Patient Details  Name: Luis Freeman MRN: [id number] Date of Birth: 1932/11/16 Referring Provider (SLP): Regis Bill, MD   Encounter Date: 07/03/2020   End of Session - 07/03/20 1136    Visit Number 4    Number of Visits 13    Date for SLP Re-Evaluation 08/08/20    Authorization Type [employer] Medicare   no deductible, $20 copay, OOP max $4000, no visit limit, auth required   SLP Start Time 1030    SLP Stop Time  1115    SLP Time Calculation (min) 45 min    Activity Tolerance Patient tolerated treatment well           Past Medical History:  Diagnosis Date  . Chronic low back pain   . Dementia ([place])   . Gait abnormality 11/15/2019  . Gait disorder   . Hypertension   . PSP (progressive supranuclear palsy) ([place]) 11/15/2019  . Vitamin B12 deficiency     Past Surgical History:  Procedure Laterality Date  . CATARACT EXTRACTION, BILATERAL    . COLONOSCOPY    . COLONOSCOPY  02/04/2011   Procedure: COLONOSCOPY;  Surgeon: Daneil Dolin, MD;  [facility];  Service: Endoscopy;  Laterality: N/A;    There were no vitals filed for this visit.   Subjective Assessment - 07/03/20 1047    Subjective "Hello!"    Patient is accompained by: Family member    Currently in Pain? No/denies            ADULT SLP TREATMENT - 07/03/20 1118      General Information   Behavior/Cognition Alert;Cooperative;Pleasant mood    Patient Positioning Upright in chair    Oral care provided N/A    HPI Luis Freeman is an 86 y.o. male with a past medical history Cancer (CMS-HCC), cataract surgery, and Hypertension who is referred by Dr. Regis Bill for evaluation of voice, speech, and cognitive screening secondary to diagnosis of progressive supranuclear palsy. Per patient's family, symptoms started 2 years  ago. He came under the care of Dr. Regis Bill at [facility] neurology in January of 2021 and was diagnosed with PSP-CBD. Patient's family complains of difficulty understanding him. He denies any swallowing difficulty. Patient lives with his wife Luis Freeman and receives 24-hour care 7 days a week. Patient has experienced several falls in the past year, most recently on July 29 with head injury requiring ED visit. Mr. Costilla developed a small parafalcine subdural hematoma noted on an ER visit on 08 Dec 2019.  His most recent CT of the brain done and August showed resolution of the small subdural. Pt is known to this SLP from previous treatment for PSP (10/04/19-11/22/2019).      Treatment Provided   Treatment provided Cognitive-Linquistic      Pain Assessment   Pain Assessment No/denies pain      Cognitive-Linquistic Treatment   Treatment focused on Dysarthria;Patient/family/caregiver education;Voice    Skilled Treatment Speakout! treatment protocol, cycles of SLP guided warm up, sustained phonation, glides, counting, reading, and cognitive speech tasks. SLP provided feedback and cues to maximize accuracy/effort.      Assessment / Recommendations / Plan   Plan Continue with current plan of care            SLP Education - 07/03/20 1136    Education Details workbook arrived, HEP  to complete lessons 2 and 3    Person(s) Educated Patient;Spouse;Caregiver(s)    Methods Explanation;Handout    Comprehension Verbalized understanding            SLP Short Term Goals - 07/03/20 1141      SLP SHORT TERM GOAL #1   Title Pt will coordinate vocal and articulatory subsystems in hierarchical speech tasks by producing sounds with intention with mod assistance.    Baseline max assist    Time 6    Period Weeks    Status On-going    Target Date 08/08/20      SLP SHORT TERM GOAL #2   Title Read phrases, sentences, and paragraphs with intention, yielding improved vocal quality, loudness, articulatory precision,  and endurance while maintaining a minimum of 75 dB with min assistance.    Baseline 55-62 dB    Time 6    Period Weeks    Status On-going    Target Date 08/08/20      SLP SHORT TERM GOAL #3   Title Generalize intentional speech to cognitive-linguistic exercises and conversational speech with improved vocal quality, loudness, articulatory precision, and endurance while maintaining a minimum of 75 dB with min assistance.    Baseline max assist for ~58 dB    Time 6    Period Weeks    Status On-going    Target Date 08/08/20            SLP Long Term Goals - 07/03/20 1141      SLP LONG TERM GOAL #1   Title Pt will be able to communicate basic wants and needs with use of intentional speech 80% of the time with mi/mod cues from conversation partner.    Baseline max assist    Time 6    Period Weeks    Status On-going            Plan - 07/03/20 1137    Clinical Impression Statement Pt accompanied to therapy by his wife and his caregiver, [name]. They received their workbook in the mail yesterday. SLP faciliated session through implementation of Lesson 2 of the [facility]! workbook. Mr. Robarts produced the following: conversation 62 dB, warm up 72 dB, /a/ for ~8 seconds at 85 dB, vocal glides 84 dB, numerical sequences 68 dB, reading phrases 64 dB, and cognitive task 66 dB. He required mod/max cues for each task, however he did demonstrate some carryover from previous session and likely from home program with sustained "ah" task (increased to 85 dB with longer duration). He has a difficult time completing oral reading tasks without SLP model in order to increase intent. Plan to complete lesson 3 next session. He had FEES yesterday in [facility] with recommendation for regular textures and thin liquids, noted impulsivity with solids.    Speech Therapy Frequency 2x / week    Duration --   6 weeks   Treatment/Interventions Compensatory techniques;Functional tasks;Multimodal communcation  approach;Cueing hierarchy;SLP instruction and feedback;Patient/family education;Compensatory strategies    Potential to Achieve Goals Fair    Potential Considerations Ability to learn/carryover information;Severity of impairments    SLP Home Exercise Plan Pt will complete HEP as assigned to facilitate carryover of treatment strategies and techniques in home environment with assist from caregivers    Consulted and Agree with Plan of Care Patient;Family member/caregiver    Family Member Consulted wife, daughter           Patient will benefit from skilled therapeutic intervention in order to improve the  following deficits and impairments:   Dysarthria and anarthria    Problem List Patient Active Problem List   Diagnosis Date Noted  . Acute metabolic encephalopathy 80/69/9967  . Acute on Chronic HypoNatremia 02/21/2020  . HTN (hypertension) 02/16/2020  . SDH (subdural hematoma) ([place]) 02/15/2020  . PSP (progressive supranuclear palsy) ([place]) 11/15/2019  . Gait abnormality 11/15/2019  . Movement disorder 05/18/2019  . Dementia [facility]) 11/10/2018   Thank you,  Genene Churn, [place]  [facility] 07/03/2020, 11:42 AM  [place] [address] [place], [place], [address] Phone: [phone]   Fax:  [phone]   Name: NICOLAAS SAVO MRN: [id number] Date of Birth: 1933/04/28

## 2020-07-08 ENCOUNTER — Encounter (HOSPITAL_COMMUNITY): Payer: Self-pay | Admitting: Speech Pathology

## 2020-07-08 ENCOUNTER — Ambulatory Visit (HOSPITAL_COMMUNITY): Payer: Medicare PPO | Admitting: Speech Pathology

## 2020-07-08 ENCOUNTER — Other Ambulatory Visit: Payer: Self-pay

## 2020-07-08 DIAGNOSIS — R41841 Cognitive communication deficit: Secondary | ICD-10-CM | POA: Diagnosis not present

## 2020-07-08 DIAGNOSIS — R471 Dysarthria and anarthria: Secondary | ICD-10-CM

## 2020-07-08 NOTE — Therapy (Signed)
[facility] [place], [place], [address] Phone: [phone]   Fax:  [phone]  Speech Language Pathology Treatment  Patient Details  Name: LEVAR FAYSON MRN: [id number] Date of Birth: 1933/01/18 Referring Provider (SLP): Regis Bill, MD   Encounter Date: 07/08/2020   End of Session - 07/08/20 1124    Visit Number 5    Number of Visits 13    Date for SLP Re-Evaluation 08/08/20    Authorization Type [employer]   no deductible, $20 copay, OOP max $4000, no visit limit, auth required   SLP Start Time 1030    SLP Stop Time  1115    SLP Time Calculation (min) 45 min    Activity Tolerance Patient tolerated treatment well           Past Medical History:  Diagnosis Date  . Chronic low back pain   . Dementia ([place])   . Gait abnormality 11/15/2019  . Gait disorder   . Hypertension   . PSP (progressive supranuclear palsy) ([place]) 11/15/2019  . Vitamin B12 deficiency     Past Surgical History:  Procedure Laterality Date  . CATARACT EXTRACTION, BILATERAL    . COLONOSCOPY    . COLONOSCOPY  02/04/2011   Procedure: COLONOSCOPY;  Surgeon: Daneil Dolin, MD;  [facility];  Service: Endoscopy;  Laterality: N/A;    There were no vitals filed for this visit.   Subjective Assessment - 07/08/20 1052    Subjective "Good morning Tannen Vandezande!"    Patient is accompained by: Family member    Currently in Pain? No/denies            ADULT SLP TREATMENT - 07/08/20 1052      General Information   Behavior/Cognition Alert;Cooperative;Pleasant mood    Patient Positioning Upright in chair    Oral care provided N/A    HPI Swade A. Im is an 86 y.o. male with a past medical history Cancer (CMS-HCC), cataract surgery, and Hypertension who is referred by Dr. Regis Bill for evaluation of voice, speech, and cognitive screening secondary to diagnosis of progressive supranuclear palsy. Per patient's family, symptoms  started 2 years ago. He came under the care of Dr. Regis Bill at [facility] neurology in January of 2021 and was diagnosed with PSP-CBD. Patient's family complains of difficulty understanding him. He denies any swallowing difficulty. Patient lives with his wife [name] and receives 24-hour care 7 days a week. Patient has experienced several falls in the past year, most recently on July 29 with head injury requiring ED visit. Mr. Tuckerman developed a small parafalcine subdural hematoma noted on an ER visit on 08 Dec 2019.  His most recent CT of the brain done and August showed resolution of the small subdural. Pt is known to this SLP from previous treatment for PSP (10/04/19-11/22/2019).      Treatment Provided   Treatment provided Cognitive-Linquistic      Pain Assessment   Pain Assessment No/denies pain      Cognitive-Linquistic Treatment   Treatment focused on Dysarthria;Patient/family/caregiver education;Voice    Skilled Treatment Speakout! treatment protocol, cycles of SLP guided warm up, sustained phonation, glides, counting, reading, and cognitive speech tasks. SLP provided feedback and cues to maximize accuracy/effort.      Assessment / Recommendations / Plan   Plan Continue with current plan of care      Progression Toward Goals   Progression toward goals Progressing toward goals  SLP Education - 07/08/20 1123    Education Details Lessons 4 and 5 over the next week    Person(s) Educated Patient;Spouse;Caregiver(s)    Methods Explanation    Comprehension Verbalized understanding            SLP Short Term Goals - 07/08/20 1124      SLP SHORT TERM GOAL #1   Title Pt will coordinate vocal and articulatory subsystems in hierarchical speech tasks by producing sounds with intention with mod assistance.    Baseline max assist    Time 6    Period Weeks    Status On-going    Target Date 08/08/20      SLP SHORT TERM GOAL #2   Title Read phrases, sentences, and paragraphs with  intention, yielding improved vocal quality, loudness, articulatory precision, and endurance while maintaining a minimum of 75 dB with min assistance.    Baseline 55-62 dB    Time 6    Period Weeks    Status On-going    Target Date 08/08/20      SLP SHORT TERM GOAL #3   Title Generalize intentional speech to cognitive-linguistic exercises and conversational speech with improved vocal quality, loudness, articulatory precision, and endurance while maintaining a minimum of 75 dB with min assistance.    Baseline max assist for ~58 dB    Time 6    Period Weeks    Status On-going    Target Date 08/08/20            SLP Long Term Goals - 07/08/20 1124      SLP LONG TERM GOAL #1   Title Pt will be able to communicate basic wants and needs with use of intentional speech 80% of the time with mi/mod cues from conversation partner.    Baseline max assist    Time 6    Period Weeks    Status On-going            Plan - 07/08/20 1124    Clinical Impression Statement Pt accompanied to therapy by his wife and his caregiver, [name]. He completed his exercises with assist from family and use of notebook. SLP faciliated session through implementation of [place] 3 of the [facility]! workbook. Mr. Compston produced the following: conversation 68 dB, warm up 76 dB, /a/ for ~8-10 seconds at 85 dB, vocal glides 84 dB, numerical sequences 70 dB, reading phrases 68 dB, and cognitive task 68 dB. He required mod/max cues for each task, however he did demonstrate some carryover from previous session and likely from home program. He has a difficult time completing oral reading tasks without SLP model in order to increase intent. Pt with improved loudness in all categories today.   Speech Therapy Frequency 2x / week    Duration --   6 weeks   Treatment/Interventions Compensatory techniques;Functional tasks;Multimodal communcation approach;Cueing hierarchy;SLP instruction and feedback;Patient/family education;Compensatory  strategies    Potential to Achieve Goals Fair    Potential Considerations Ability to learn/carryover information;Severity of impairments    SLP Home Exercise Plan Pt will complete HEP as assigned to facilitate carryover of treatment strategies and techniques in home environment with assist from caregivers    Consulted and Agree with Plan of Care Patient;Family member/caregiver    Family Member Consulted wife, daughter           Patient will benefit from skilled therapeutic intervention in order to improve the following deficits and impairments:   Dysarthria and anarthria  Cognitive communication deficit  Problem List Patient Active Problem List   Diagnosis Date Noted  . Acute metabolic encephalopathy 03/02/4817  . Acute on Chronic HypoNatremia 02/21/2020  . HTN (hypertension) 02/16/2020  . SDH (subdural hematoma) ([place]) 02/15/2020  . PSP (progressive supranuclear palsy) ([place]) 11/15/2019  . Gait abnormality 11/15/2019  . Movement disorder 05/18/2019  . Dementia [facility]) 11/10/2018   Thank you,  Genene Churn, [place]  [facility] 07/08/2020, 11:25 AM  [place] [address] [place], [place], [address] Phone: [phone]   Fax:  [phone]   Name: BEAUX WEDEMEYER MRN: [id number] Date of Birth: 12-20-1932

## 2020-07-16 ENCOUNTER — Other Ambulatory Visit: Payer: Self-pay

## 2020-07-16 ENCOUNTER — Encounter (HOSPITAL_COMMUNITY): Payer: Self-pay | Admitting: Speech Pathology

## 2020-07-16 ENCOUNTER — Ambulatory Visit (HOSPITAL_COMMUNITY): Payer: Medicare PPO | Admitting: Speech Pathology

## 2020-07-16 DIAGNOSIS — R471 Dysarthria and anarthria: Secondary | ICD-10-CM

## 2020-07-16 DIAGNOSIS — R41841 Cognitive communication deficit: Secondary | ICD-10-CM | POA: Diagnosis not present

## 2020-07-16 NOTE — Therapy (Signed)
[facility] [address] [place], [place], [address] Phone: [phone]   Fax:  [phone]  Speech Language Pathology Treatment  Patient Details  Name: Luis Freeman MRN: [id number] Date of Birth: [birth year] Referring Provider (SLP): Morene Rankins, MD   Encounter Date: 07/16/2020   End of Session - 07/16/20 1640    Visit Number 6    Number of Visits 13    Date for SLP Re-Evaluation 08/08/20    Authorization Type [employer]   no deductible, $20 copay, OOP max $4000, no visit limit, auth required   SLP Start Time 1525    SLP Stop Time  1610    SLP Time Calculation (min) 45 min    Activity Tolerance Patient tolerated treatment well           Past Medical History:  Diagnosis Date  . Chronic low back pain   . Dementia ([facility])   . Gait abnormality 11/15/2019  . Gait disorder   . Hypertension   . 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 Vitamin B12 deficiency     Past Surgical History:  Procedure Laterality Date  . CATARACT EXTRACTION, BILATERAL    . COLONOSCOPY    . COLONOSCOPY  02/04/2011   Procedure: COLONOSCOPY;  Surgeon: Corbin Ade, MD;  [facility];  Service: Endoscopy;  Laterality: N/A;    There were no vitals filed for this visit.   Subjective Assessment - 07/16/20 1638    Subjective "Have a happy new year."    Patient is accompained by: Family member    Currently in Pain? No/denies            ADULT SLP TREATMENT - 07/16/20 1639      General Information   Behavior/Cognition Alert;Cooperative;Pleasant mood    Patient Positioning Upright in chair    Oral care provided N/A    HPI Luis Freeman is an 86 y.o. male with a past medical history Cancer (CMS-HCC), cataract surgery, and Hypertension who is referred by Dr. Morene Rankins for evaluation of voice, speech, and cognitive screening secondary to diagnosis of progressive supranuclear palsy. Per patient's family, symptoms  started 2 years ago. He came under the care of Dr. Morene Rankins at [facility] neurology in January of 2021 and was diagnosed with PSP-CBD. Patient's family complains of difficulty understanding him. He denies any swallowing difficulty. Patient lives with his wife Luis Freeman and receives 24-hour care 7 days a week. Patient has experienced several falls in the past year, most recently on July 29 with head injury requiring ED visit. Mr. Fugate developed a small parafalcine subdural hematoma noted on an ER visit on 08 Dec 2019.  His most recent CT of the brain done and August showed resolution of the small subdural. Pt is known to this SLP from previous treatment for PSP (10/04/19-11/22/2019).      Treatment Provided   Treatment provided Cognitive-Linquistic      Pain Assessment   Pain Assessment No/denies pain      Cognitive-Linquistic Treatment   Treatment focused on Dysarthria;Patient/family/caregiver education;Voice    Skilled Treatment Speakout! treatment protocol, cycles of SLP guided warm up, sustained phonation, glides, counting, reading, and cognitive speech tasks. SLP provided feedback and cues to maximize accuracy/effort.      Assessment / Recommendations / Plan   Plan Continue with current plan of care      Progression Toward Goals   Progression toward goals Progressing toward goals  SLP Education - 07/16/20 1639    Education Details Continue with Lesson 6 at home    Person(s) Educated Patient;Spouse;Child(ren)    Methods Explanation;Handout    Comprehension Verbalized understanding            SLP Short Term Goals - 07/16/20 1641      SLP SHORT TERM GOAL #1   Title Pt will coordinate vocal and articulatory subsystems in hierarchical speech tasks by producing sounds with intention with mod assistance.    Baseline max assist    Time 6    Period Weeks    Status On-going    Target Date 08/08/20      SLP SHORT TERM GOAL #2   Title Read phrases, sentences, and paragraphs  with intention, yielding improved vocal quality, loudness, articulatory precision, and endurance while maintaining a minimum of 75 dB with min assistance.    Baseline 55-62 dB    Time 6    Period Weeks    Status On-going    Target Date 08/08/20      SLP SHORT TERM GOAL #3   Title Generalize intentional speech to cognitive-linguistic exercises and conversational speech with improved vocal quality, loudness, articulatory precision, and endurance while maintaining a minimum of 75 dB with min assistance.    Baseline max assist for ~58 dB    Time 6    Period Weeks    Status On-going    Target Date 08/08/20            SLP Long Term Goals - 07/16/20 1641      SLP LONG TERM GOAL #1   Title Pt will be able to communicate basic wants and needs with use of intentional speech 80% of the time with mi/mod cues from conversation partner.    Baseline max assist    Time 6    Period Weeks    Status On-going            Plan - 07/16/20 1640    Clinical Impression Statement Pt accompanied to therapy by his wife and his daughter. He completed his exercises with assist from family and use of notebook. SLP faciliated session through implementation of [place] [facility] of the [facility]! workbook. Mr. Klutz produced the following: conversation 68 dB, warm up 72 dB, /a/ for ~8-10 seconds at 85 dB, vocal glides 84 dB, numerical sequences 71 dB, reading phrases 68 dB, and cognitive task 68 dB. He required mod cues for task completion and mod/max cues to increase loudness. Family reports subjective improvement in speech intelligibility at home. Continue plan of care.   Speech Therapy Frequency 2x / week    Duration --   6 weeks   Treatment/Interventions Compensatory techniques;Functional tasks;Multimodal communcation approach;Cueing hierarchy;SLP instruction and feedback;Patient/family education;Compensatory strategies    Potential to Achieve Goals Fair    Potential Considerations Ability to learn/carryover  information;Severity of impairments    SLP Home Exercise Plan Pt will complete HEP as assigned to facilitate carryover of treatment strategies and techniques in home environment with assist from caregivers    Consulted and Agree with Plan of Care Patient;Family member/caregiver    Family Member Consulted wife, daughter           Patient will benefit from skilled therapeutic intervention in order to improve the following deficits and impairments:   Dysarthria and anarthria    Problem List Patient Active Problem List   Diagnosis Date Noted  . Acute metabolic encephalopathy 123XX123  . Acute on Chronic HypoNatremia 02/21/2020  .  HTN (hypertension) 02/16/2020  . SDH (subdural hematoma) ([place]) 02/15/2020  . PSP (progressive supranuclear palsy) ([place]) 11/15/2019  . Gait abnormality 11/15/2019  . Movement disorder 05/18/2019  . Dementia [facility]) 11/10/2018   Thank you,  Genene Churn, [place]  [facility] 07/16/2020, 4:41 PM  [place] [address] [place], [place], [address] Phone: [phone]   Fax:  [phone]   Name: RAEVON SUDBECK MRN: [id number] Date of Birth: [birth year]

## 2020-07-18 ENCOUNTER — Encounter (HOSPITAL_COMMUNITY): Payer: Self-pay | Admitting: Speech Pathology

## 2020-07-18 ENCOUNTER — Ambulatory Visit (HOSPITAL_COMMUNITY): Payer: Medicare PPO | Admitting: Speech Pathology

## 2020-07-18 ENCOUNTER — Other Ambulatory Visit: Payer: Self-pay

## 2020-07-18 DIAGNOSIS — R471 Dysarthria and anarthria: Secondary | ICD-10-CM | POA: Diagnosis not present

## 2020-07-18 DIAGNOSIS — D519 Vitamin B12 deficiency anemia, unspecified: Secondary | ICD-10-CM | POA: Diagnosis not present

## 2020-07-18 DIAGNOSIS — R41841 Cognitive communication deficit: Secondary | ICD-10-CM | POA: Diagnosis not present

## 2020-07-18 NOTE — Therapy (Signed)
[place] [facility] [address] [place], [place], [address] Phone: [phone]   Fax:  [phone]  Speech Language Pathology Treatment  Patient Details  Name: Luis Freeman MRN: [id number] Date of Birth: 04/01/1933 Referring Provider (SLP): Morene Rankins, MD   Encounter Date: 07/18/2020   End of Session - 07/18/20 1117    Visit Number 7    Number of Visits 13    Date for SLP Re-Evaluation 08/08/20    Authorization Type [employer] Medicare   no deductible, $20 copay, OOP max $4000, no visit limit, auth required   SLP Start Time 1036    SLP Stop Time  1123    SLP Time Calculation (min) 47 min    Activity Tolerance Patient tolerated treatment well           Past Medical History:  Diagnosis Date  . Chronic low back pain   . Dementia ([facility])   . Gait abnormality 11/15/2019  . Gait disorder   . Hypertension   . 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 Vitamin B12 deficiency     Past Surgical History:  Procedure Laterality Date  . CATARACT EXTRACTION, BILATERAL    . COLONOSCOPY    . COLONOSCOPY  02/04/2011   Procedure: COLONOSCOPY;  Surgeon: Corbin Ade, MD;  [facility];  Service: Endoscopy;  Laterality: N/A;    There were no vitals filed for this visit.   Subjective Assessment - 07/18/20 1115    Subjective "Good morning."    Patient is accompained by: --   caregiver, Luis Freeman   Currently in Pain? No/denies                 ADULT SLP TREATMENT - 07/18/20 1116      General Information   Behavior/Cognition Alert;Cooperative;Pleasant mood    Patient Positioning Upright in chair    Oral care provided N/A    HPI Luis Freeman is an 86 y.o. male with a past medical history Cancer (CMS-HCC), cataract surgery, and Hypertension who is referred by Dr. Morene Rankins for evaluation of voice, speech, and cognitive screening secondary to diagnosis of progressive supranuclear palsy. Per patient's family,  symptoms started 2 years ago. He came under the care of Dr. Morene Rankins at [facility] neurology in January of 2021 and was diagnosed with PSP-CBD. Patient's family complains of difficulty understanding him. He denies any swallowing difficulty. Patient lives with Luis Freeman and receives 24-hour care 7 days a week. Patient has experienced several falls in the past year, most recently on July 29 with head injury requiring ED visit. Luis Freeman developed a small parafalcine subdural hematoma noted on an ER visit on 08 Dec 2019.  Luis most recent CT of the brain done and August showed resolution of the small subdural. Pt is known to this SLP from previous treatment for PSP (10/04/19-11/22/2019).      Treatment Provided   Treatment provided Cognitive-Linquistic      Pain Assessment   Pain Assessment No/denies pain      Cognitive-Linquistic Treatment   Treatment focused on Dysarthria;Patient/family/caregiver education;Voice    Skilled Treatment Speakout! treatment protocol, cycles of SLP guided warm up, sustained phonation, glides, counting, reading, and cognitive speech tasks. SLP provided feedback and cues to maximize accuracy/effort.      Assessment / Recommendations / Plan   Plan Continue with current plan of care      Progression Toward Goals   Progression toward goals Progressing toward goals  SLP Short Term Goals - 07/18/20 1123      SLP SHORT TERM GOAL #1   Title Pt will coordinate vocal and articulatory subsystems in hierarchical speech tasks by producing sounds with intention with mod assistance.    Baseline max assist    Time 6    Period Weeks    Status On-going    Target Date 08/08/20      SLP SHORT TERM GOAL #2   Title Read phrases, sentences, and paragraphs with intention, yielding improved vocal quality, loudness, articulatory precision, and endurance while maintaining a minimum of 75 dB with min assistance.    Baseline 55-62 dB    Time 6    Period Weeks     Status On-going    Target Date 08/08/20      SLP SHORT TERM GOAL #3   Title Generalize intentional speech to cognitive-linguistic exercises and conversational speech with improved vocal quality, loudness, articulatory precision, and endurance while maintaining a minimum of 75 dB with min assistance.    Baseline max assist for ~58 dB    Time 6    Period Weeks    Status On-going    Target Date 08/08/20            SLP Long Term Goals - 07/18/20 1123      SLP LONG TERM GOAL #1   Title Pt will be able to communicate basic wants and needs with use of intentional speech 80% of the time with mi/mod cues from conversation partner.    Baseline max assist    Time 6    Period Weeks    Status On-going            Plan - 07/18/20 1123    Clinical Impression Statement Pt accompanied to therapy by Luis caregiver, Luis Freeman. Luis wife stayed home today. He completed Luis exercises with assist from family and use of notebook. SLP faciliated session through implementation of [place] 7 of the [facility]! workbook. Luis Freeman produced the following: conversation 68 dB, warm up 74 dB, /a/ for ~8-10 seconds at 86 dB, vocal glides 84 dB, numerical sequences 74 dB, reading phrases 68 dB, and cognitive task 68 dB. He required mod cues for task completion and mod/max cues to increase loudness. Vocal intensity decreases significantly during oral reading tasks and he requires cues to increase. Luis Freeman reports that he initiates dialogue at home when she arrives and that she understands 98% of what he says. Pt may not be able to complete the more complex oral reading and cognitive tasks in each lesson, so Pt/caregiver were encouraged to modify as needed. He has a "communication template" we created during previous treatment, and this would be good practice for him to read aloud. Continue plan of care.    Speech Therapy Frequency 2x / week    Duration --   6 weeks   Treatment/Interventions Compensatory techniques;Functional  tasks;Multimodal communcation approach;Cueing hierarchy;SLP instruction and feedback;Patient/family education;Compensatory strategies    Potential to Achieve Goals Fair    Potential Considerations Ability to learn/carryover information;Severity of impairments    SLP Home Exercise Plan Pt will complete HEP as assigned to facilitate carryover of treatment strategies and techniques in home environment with assist from caregivers    Consulted and Agree with Plan of Care Patient;Family member/caregiver    Family Member Consulted wife, daughter           Patient will benefit from skilled therapeutic intervention in order to improve the following deficits and impairments:  Dysarthria and anarthria    Problem List Patient Active Problem List   Diagnosis Date Noted  . Acute metabolic encephalopathy 123XX123  . Acute on Chronic HypoNatremia 02/21/2020  . HTN (hypertension) 02/16/2020  . SDH (subdural hematoma) ([place]) 02/15/2020  . PSP (progressive supranuclear palsy) ([place]) 11/15/2019  . Gait abnormality 11/15/2019  . Movement disorder 05/18/2019  . Dementia [facility]) 11/10/2018   Thank you,  Genene Churn, [place]  [facility] 07/18/2020, 11:23 AM  [place] [address] [place], [place], [address] Phone: [phone]   Fax:  [phone]   Name: JABRON SALEEM MRN: [id number] Date of Birth: [birth year]

## 2020-07-22 ENCOUNTER — Encounter (HOSPITAL_COMMUNITY): Payer: Self-pay | Admitting: Speech Pathology

## 2020-07-22 ENCOUNTER — Other Ambulatory Visit: Payer: Self-pay

## 2020-07-22 ENCOUNTER — Ambulatory Visit (HOSPITAL_COMMUNITY): Payer: Medicare PPO | Attending: Neurology | Admitting: Speech Pathology

## 2020-07-22 DIAGNOSIS — R471 Dysarthria and anarthria: Secondary | ICD-10-CM | POA: Diagnosis not present

## 2020-07-22 NOTE — Therapy (Signed)
[facility] [address] [place], [place], [address] Phone: [phone]   Fax:  [phone]  Speech Language Pathology Treatment  Patient Details  Name: Luis Freeman MRN: [id number] Date of Birth: May 10, 1933 Referring Provider (SLP): Morene Rankins, MD   Encounter Date: 07/22/2020   End of Session - 07/22/20 1259    Visit Number 8    Number of Visits 13    Date for SLP Re-Evaluation 08/08/20    Authorization Type [employer]   no deductible, $20 copay, OOP max $4000, no visit limit, auth required   SLP Start Time 0955    SLP Stop Time  1033    SLP Time Calculation (min) 38 min    Activity Tolerance Patient tolerated treatment well           Past Medical History:  Diagnosis Date  . Chronic low back pain   . Dementia (HCC)   . Gait abnormality 11/15/2019  . Gait disorder   . Hypertension   . 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 Vitamin B12 deficiency     Past Surgical History:  Procedure Laterality Date  . CATARACT EXTRACTION, BILATERAL    . COLONOSCOPY    . COLONOSCOPY  02/04/2011   Procedure: COLONOSCOPY;  Surgeon: Corbin Ade, MD;  [facility];  Service: Endoscopy;  Laterality: N/A;    There were no vitals filed for this visit.   Subjective Assessment - 07/22/20 1017    Subjective "Happy New Year!"    Patient is accompained by: Family member    Currently in Pain? No/denies                 ADULT SLP TREATMENT - 07/22/20 1020      General Information   Behavior/Cognition Alert;Cooperative;Pleasant mood    Patient Positioning Upright in chair    Oral care provided N/A    HPI Luis A. Luis Freeman is an 87 y.o. male with a past medical history Cancer (CMS-HCC), cataract surgery, and Hypertension who is referred by Dr. Morene Rankins for evaluation of voice, speech, and cognitive screening secondary to diagnosis of progressive supranuclear palsy. Per patient's family, symptoms  started 2 years ago. He came under the care of Dr. Morene Rankins at [facility] neurology in January of 2021 and was diagnosed with PSP-CBD. Patient's family complains of difficulty understanding him. He denies any swallowing difficulty. Patient lives with his wife Luis Freeman and receives 24-hour care 7 days a week. Patient has experienced several falls in the past year, most recently on July 29 with head injury requiring ED visit. Mr. [place] developed a small parafalcine subdural hematoma noted on an ER visit on 08 Dec 2019.  His most recent CT of the brain done and August showed resolution of the small subdural. Pt is known to this SLP from previous treatment for PSP (10/04/19-11/22/2019).      Treatment Provided   Treatment provided Cognitive-Linquistic      Pain Assessment   Pain Assessment No/denies pain      Cognitive-Linquistic Treatment   Treatment focused on Dysarthria;Patient/family/caregiver education;Voice    Skilled Treatment Speakout! treatment protocol, cycles of SLP guided warm up, sustained phonation, glides, counting, reading, and cognitive speech tasks. SLP provided feedback and cues to maximize accuracy/effort.      Assessment / Recommendations / Plan   Plan Continue with current plan of care      Progression Toward Goals   Progression toward goals Progressing toward goals  SLP Education - 07/22/20 1259    Education Details Stick with lessons 1-6 at home    Person(s) Educated Patient;Caregiver(s)    Methods Explanation    Comprehension Verbalized understanding            SLP Short Term Goals - 07/22/20 1311      SLP SHORT TERM GOAL #1   Title Pt will coordinate vocal and articulatory subsystems in hierarchical speech tasks by producing sounds with intention with mod assistance.    Baseline max assist    Time 6    Period Weeks    Status On-going    Target Date 08/08/20      SLP SHORT TERM GOAL #2   Title Read phrases, sentences, and paragraphs with intention,  yielding improved vocal quality, loudness, articulatory precision, and endurance while maintaining a minimum of 75 dB with min assistance.    Baseline 55-62 dB    Time 6    Period Weeks    Status On-going    Target Date 08/08/20      SLP SHORT TERM GOAL #3   Title Generalize intentional speech to cognitive-linguistic exercises and conversational speech with improved vocal quality, loudness, articulatory precision, and endurance while maintaining a minimum of 75 dB with min assistance.    Baseline max assist for ~58 dB    Time 6    Period Weeks    Status On-going    Target Date 08/08/20            SLP Long Term Goals - 07/22/20 1311      SLP LONG TERM GOAL #1   Title Pt will be able to communicate basic wants and needs with use of intentional speech 80% of the time with mi/mod cues from conversation partner.    Baseline max assist    Time 6    Period Weeks    Status On-going            Plan - 07/22/20 1300    Clinical Impression Statement Pt accompanied to therapy by his caregiver, Luis Freeman and daughter. His wife stayed home today. He completed his exercises with assist from family and use of notebook. SLP faciliated session through implementation of [place] [employer] of the [facility]! workbook. Mr. Coopersmith produced the following: conversation 66 dB, warm up 74 dB, /a/ for ~8-10 seconds at 80 dB, vocal glides 80 dB with no change in intonation, numerical sequences 73 dB, reading phrases 68 dB, and cognitive task 66 dB. He required min cues for task completion and mod/max cues to increase loudness. Vocal intensity decreases significantly during oral reading tasks and he requires cues to increase. Pt with increased loudness when SLP moved across the room and his vocal intensity remained (slightly) when moved back in closer proximity. Continue plan of care.    Speech Therapy Frequency 2x / week    Duration --   6 weeks   Treatment/Interventions Compensatory techniques;Functional tasks;Multimodal  communcation approach;Cueing hierarchy;SLP instruction and feedback;Patient/family education;Compensatory strategies    Potential to Achieve Goals Fair    Potential Considerations Ability to learn/carryover information;Severity of impairments    SLP Home Exercise Plan Pt will complete HEP as assigned to facilitate carryover of treatment strategies and techniques in home environment with assist from caregivers    Consulted and Agree with Plan of Care Patient;Family member/caregiver    Family Member Consulted wife, daughter           Patient will benefit from skilled therapeutic intervention in order to improve  the following deficits and impairments:   Dysarthria and anarthria    Problem List Patient Active Problem List   Diagnosis Date Noted  . Acute metabolic encephalopathy 123XX123  . Acute on Chronic HypoNatremia 02/21/2020  . HTN (hypertension) 02/16/2020  . SDH (subdural hematoma) ([place]) 02/15/2020  . PSP (progressive supranuclear palsy) ([place]) 11/15/2019  . Gait abnormality 11/15/2019  . Movement disorder 05/18/2019  . Dementia [facility]) 11/10/2018   Thank you,  Genene Churn, [place] 07/22/2020, 1:12 PM  [facility] [address] [place], [place], [address] Phone: [phone]   Fax:  [phone]   Name: SAYVION DEMA MRN: [id number] Date of Birth: [birth year]

## 2020-07-24 ENCOUNTER — Ambulatory Visit (HOSPITAL_COMMUNITY): Payer: Medicare PPO | Admitting: Speech Pathology

## 2020-07-24 ENCOUNTER — Other Ambulatory Visit: Payer: Self-pay

## 2020-07-24 ENCOUNTER — Encounter (HOSPITAL_COMMUNITY): Payer: Self-pay | Admitting: Speech Pathology

## 2020-07-24 DIAGNOSIS — S065X0D Traumatic subdural hemorrhage without loss of consciousness, subsequent encounter: Secondary | ICD-10-CM | POA: Diagnosis not present

## 2020-07-24 DIAGNOSIS — R471 Dysarthria and anarthria: Secondary | ICD-10-CM | POA: Diagnosis not present

## 2020-07-24 NOTE — Therapy (Signed)
[facility] [address] [place], [place], [address] Phone: [phone]   Fax:  [phone]  Speech Language Pathology Treatment  Patient Details  Name: Luis Freeman MRN: [id number] Date of Birth: [birth year] Referring Provider (SLP): Morene Rankins, MD   Encounter Date: 07/24/2020   End of Session - 07/24/20 1026    Visit Number 9    Number of Visits 13    Date for SLP Re-Evaluation 08/08/20    Authorization Type [employer]   no deductible, $20 copay, OOP max $4000, no visit limit, auth required   SLP Start Time 0947    SLP Stop Time  1030    SLP Time Calculation (min) 43 min    Activity Tolerance Patient tolerated treatment well           Past Medical History:  Diagnosis Date  . Chronic low back pain   . Dementia (HCC)   . Gait abnormality 11/15/2019  . Gait disorder   . Hypertension   . 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 Vitamin B12 deficiency     Past Surgical History:  Procedure Laterality Date  . CATARACT EXTRACTION, BILATERAL    . COLONOSCOPY    . COLONOSCOPY  02/04/2011   Procedure: COLONOSCOPY;  Surgeon: Corbin Ade, MD;  [facility];  Service: Endoscopy;  Laterality: N/A;    There were no vitals filed for this visit.   Subjective Assessment - 07/24/20 1007    Subjective "Luis Freeman"    Patient is accompained by: Family member    Currently in Pain? No/denies            ADULT SLP TREATMENT - 07/24/20 1025      General Information   Behavior/Cognition Alert;Cooperative;Pleasant mood    Patient Positioning Upright in chair    Oral care provided N/A    HPI Luis Freeman is an 87 y.o. male with a past medical history Cancer (CMS-HCC), cataract surgery, and Hypertension who is referred by Dr. Morene Rankins for evaluation of voice, speech, and cognitive screening secondary to diagnosis of progressive supranuclear palsy. Per patient's family, symptoms started 2 years ago. He  came under the care of Dr. Morene Rankins at [facility] neurology in January of 2021 and was diagnosed with PSP-CBD. Patient's family complains of difficulty understanding him. He denies any swallowing difficulty. Patient lives with his wife Luis Freeman and receives 24-hour care 7 days a week. Patient has experienced several falls in the past year, most recently on July 29 with head injury requiring ED visit. Luis Freeman developed a small parafalcine subdural hematoma noted on an ER visit on 08 Dec 2019.  His most recent CT of the brain done and August showed resolution of the small subdural. Pt is known to this SLP from previous treatment for PSP (10/04/19-11/22/2019).      Treatment Provided   Treatment provided Cognitive-Linquistic      Pain Assessment   Pain Assessment No/denies pain      Cognitive-Linquistic Treatment   Treatment focused on Dysarthria;Patient/family/caregiver education;Voice    Skilled Treatment Speakout! treatment protocol, cycles of SLP guided warm up, sustained phonation, glides, counting, reading, and cognitive speech tasks. SLP provided feedback and cues to maximize accuracy/effort.      Assessment / Recommendations / Plan   Plan Continue with current plan of care      Progression Toward Goals   Progression toward goals Progressing toward goals  SLP Short Term Goals - 07/24/20 1029      SLP SHORT TERM GOAL #1   Title Pt will coordinate vocal and articulatory subsystems in hierarchical speech tasks by producing sounds with intention with mod assistance.    Baseline max assist    Time 6    Period Weeks    Status On-going    Target Date 08/08/20      SLP SHORT TERM GOAL #2   Title Read phrases, sentences, and paragraphs with intention, yielding improved vocal quality, loudness, articulatory precision, and endurance while maintaining a minimum of 75 dB with min assistance.    Baseline 55-62 dB    Time 6    Period Weeks    Status On-going    Target Date  08/08/20      SLP SHORT TERM GOAL #3   Title Generalize intentional speech to cognitive-linguistic exercises and conversational speech with improved vocal quality, loudness, articulatory precision, and endurance while maintaining a minimum of 75 dB with min assistance.    Baseline max assist for ~58 dB    Time 6    Period Weeks    Status On-going    Target Date 08/08/20            SLP Long Term Goals - 07/24/20 1030      SLP LONG TERM GOAL #1   Title Pt will be able to communicate basic wants and needs with use of intentional speech 80% of the time with mi/mod cues from conversation partner.    Baseline max assist    Time 6    Period Weeks    Status On-going            Plan - 07/24/20 1029    Clinical Impression Statement Pt accompanied to therapy by his caregiver, Luis Freeman and his wife. He completed his exercises with assist from family and use of notebook. SLP faciliated session through implementation of [place] [employer] of the [facility]! workbook. Luis Freeman produced the following: conversation 66-70 dB, warm up 72 dB, /a/ for ~8-10 seconds at 80-85 dB, vocal glides 80 dB with no change in intonation, numerical sequences 72 dB, reading phrases 68 dB, and cognitive task 68 dB. He required min cues for task completion and mod/max cues to increase loudness. Pt was able to provide accurate and creative responses during cognitive communication tasks ("V is for variety"). Family indicates that he is more intelligible at home, especially when he speaks in shorter phrases. Continue plan of care.    Speech Therapy Frequency 2x / week    Duration --   6 weeks   Treatment/Interventions Compensatory techniques;Functional tasks;Multimodal communcation approach;Cueing hierarchy;SLP instruction and feedback;Patient/family education;Compensatory strategies    Potential to Achieve Goals Fair    Potential Considerations Ability to learn/carryover information;Severity of impairments    SLP Home Exercise Plan  Pt will complete HEP as assigned to facilitate carryover of treatment strategies and techniques in home environment with assist from caregivers    Consulted and Agree with Plan of Care Patient;Family member/caregiver    Family Member Consulted wife, daughter           Patient will benefit from skilled therapeutic intervention in order to improve the following deficits and impairments:   Dysarthria and anarthria    Problem List Patient Active Problem List   Diagnosis Date Noted  . Acute metabolic encephalopathy 123XX123  . Acute on Chronic HypoNatremia 02/21/2020  . HTN (hypertension) 02/16/2020  . SDH (subdural hematoma) ([place]) 02/15/2020  .  PSP (progressive supranuclear palsy) ([place]) 11/15/2019  . Gait abnormality 11/15/2019  . Movement disorder 05/18/2019  . Dementia [facility]) 11/10/2018   Thank you,  Genene Churn, [place]  [facility] 07/24/2020, 10:33 AM  [facility] [address] [place], [place], [address] Phone: [phone]   Fax:  [phone]   Name: Luis Freeman MRN: [id number] Date of Birth: 1932/08/22

## 2020-07-29 ENCOUNTER — Ambulatory Visit (HOSPITAL_COMMUNITY): Payer: Medicare PPO | Admitting: Speech Pathology

## 2020-07-29 DIAGNOSIS — Z9181 History of falling: Secondary | ICD-10-CM | POA: Diagnosis not present

## 2020-07-29 DIAGNOSIS — R4189 Other symptoms and signs involving cognitive functions and awareness: Secondary | ICD-10-CM | POA: Diagnosis not present

## 2020-07-29 DIAGNOSIS — G231 Progressive supranuclear ophthalmoplegia [Steele-Richardson-Olszewski]: Secondary | ICD-10-CM | POA: Diagnosis not present

## 2020-07-29 DIAGNOSIS — R1312 Dysphagia, oropharyngeal phase: Secondary | ICD-10-CM | POA: Diagnosis not present

## 2020-07-31 ENCOUNTER — Other Ambulatory Visit: Payer: Self-pay

## 2020-07-31 ENCOUNTER — Ambulatory Visit (HOSPITAL_COMMUNITY): Payer: Medicare PPO | Admitting: Speech Pathology

## 2020-07-31 ENCOUNTER — Encounter (HOSPITAL_COMMUNITY): Payer: Self-pay | Admitting: Speech Pathology

## 2020-07-31 DIAGNOSIS — R471 Dysarthria and anarthria: Secondary | ICD-10-CM | POA: Diagnosis not present

## 2020-07-31 DIAGNOSIS — E871 Hypo-osmolality and hyponatremia: Secondary | ICD-10-CM | POA: Diagnosis not present

## 2020-07-31 DIAGNOSIS — Z79899 Other long term (current) drug therapy: Secondary | ICD-10-CM | POA: Diagnosis not present

## 2020-07-31 DIAGNOSIS — G231 Progressive supranuclear ophthalmoplegia [Steele-Richardson-Olszewski]: Secondary | ICD-10-CM | POA: Diagnosis not present

## 2020-07-31 NOTE — Therapy (Signed)
Luis Freeman, [place], [address] Phone: [phone]   Fax:  [phone]  Speech Language Pathology Treatment  Patient Details  Name: Luis Freeman MRN: [id number] Date of Birth: [birth year] Referring Provider (SLP): Regis Bill, MD   Encounter Date: 07/31/2020   End of Session - 07/31/20 1015    Visit Number 10    Number of Visits 13    Date for SLP Re-Evaluation 08/08/20    Authorization Type [employer] Medicare   no deductible, $20 copay, OOP max $4000, no visit limit, auth required   SLP Start Time 0946    SLP Stop Time  1030    SLP Time Calculation (min) 44 min    Activity Tolerance Patient tolerated treatment well           Past Medical History:  Diagnosis Date  . Chronic low back pain   . Dementia ([place])   . Gait abnormality 11/15/2019  . Gait disorder   . Hypertension   . PSP (progressive supranuclear palsy) ([place]) 11/15/2019  . Vitamin B12 deficiency     Past Surgical History:  Procedure Laterality Date  . CATARACT EXTRACTION, BILATERAL    . COLONOSCOPY    . COLONOSCOPY  02/04/2011   Procedure: COLONOSCOPY;  Surgeon: Daneil Dolin, MD;  [facility];  Service: Endoscopy;  Laterality: N/A;    There were no vitals filed for this visit.   Subjective Assessment - 07/31/20 1003    Subjective "It was right down the party lines."    Patient is accompained by: Family member    Currently in Pain? No/denies            ADULT SLP TREATMENT - 07/31/20 1005      General Information   Behavior/Cognition Alert;Cooperative;Pleasant mood    Patient Positioning Upright in chair    Oral care provided N/A    HPI Marguis A. Freeman is an 87 y.o. male with a past medical history Cancer (CMS-HCC), cataract surgery, and Hypertension who is referred by Dr. Regis Bill for evaluation of voice, speech, and cognitive screening secondary to diagnosis of progressive supranuclear palsy. Per patient's family,  symptoms started 2 years ago. He came under the care of Dr. Regis Bill at [facility] neurology in January of 2021 and was diagnosed with PSP-CBD. Patient's family complains of difficulty understanding him. He denies any swallowing difficulty. Patient lives with his wife Luis Freeman and receives 24-hour care 7 days a week. Patient has experienced several falls in the past year, most recently on July 29 with head injury requiring ED visit. Mr. Wicke developed a small parafalcine subdural hematoma noted on an ER visit on 08 Dec 2019.  His most recent CT of the brain done and August showed resolution of the small subdural. Pt is known to this SLP from previous treatment for PSP (10/04/19-11/22/2019).      Treatment Provided   Treatment provided Cognitive-Linquistic      Pain Assessment   Pain Assessment No/denies pain      Cognitive-Linquistic Treatment   Treatment focused on Dysarthria;Patient/family/caregiver education;Voice    Skilled Treatment Speakout! treatment protocol, cycles of SLP guided warm up, sustained phonation, glides, counting, reading, and cognitive speech tasks. SLP provided feedback and cues to maximize accuracy/effort.      Assessment / Recommendations / Plan   Plan Continue with current plan of care      Progression Toward Goals   Progression toward goals Progressing toward goals  SLP Short Term Goals - 07/31/20 1015      SLP SHORT TERM GOAL #1   Title Pt will coordinate vocal and articulatory subsystems in hierarchical speech tasks by producing sounds with intention with mod assistance.    Baseline max assist    Time 6    Period Weeks    Status On-going    Target Date 08/08/20      SLP SHORT TERM GOAL #2   Title Read phrases, sentences, and paragraphs with intention, yielding improved vocal quality, loudness, articulatory precision, and endurance while maintaining a minimum of 75 dB with min assistance.    Baseline 55-62 dB    Time 6    Period Weeks     Status On-going    Target Date 08/08/20      SLP SHORT TERM GOAL #3   Title Generalize intentional speech to cognitive-linguistic exercises and conversational speech with improved vocal quality, loudness, articulatory precision, and endurance while maintaining a minimum of 75 dB with min assistance.    Baseline max assist for ~58 dB    Time 6    Period Weeks    Status On-going    Target Date 08/08/20            SLP Long Term Goals - 07/31/20 1017      SLP LONG TERM GOAL #1   Title Pt will be able to communicate basic wants and needs with use of intentional speech 80% of the time with mi/mod cues from conversation partner.    Baseline max assist    Time 6    Period Weeks    Status On-going            Plan - 07/31/20 1015    Clinical Impression Statement Pt accompanied to therapy by his caregiver, [name] and his wife. He completed his exercises with assist from family and use of notebook. SLP faciliated session through implementation of [place] 9 of the [facility]! workbook. Mr. Vantassell produced the following: conversation 66-70 dB, warm up 72 dB, /a/ for ~8-10 seconds at 80-85 dB, vocal glides 80 dB with no change in intonation, numerical sequences 72 dB, reading phrases 65 dB, and cognitive task 63 dB. SLP provided an additional carrier phrase (Give me the ____), during cognitive communication task of adding colored shapes to a puzzle. He required mod cues state a complete sentence with the task. Family was encouraged to continue with this activity at home. Continue plan of care.    Speech Therapy Frequency 2x / week    Duration --   6 weeks   Treatment/Interventions Compensatory techniques;Functional tasks;Multimodal communcation approach;Cueing hierarchy;SLP instruction and feedback;Patient/family education;Compensatory strategies    Potential to Achieve Goals Fair    Potential Considerations Ability to learn/carryover information;Severity of impairments    SLP Home Exercise Plan Pt  will complete HEP as assigned to facilitate carryover of treatment strategies and techniques in home environment with assist from caregivers    Consulted and Agree with Plan of Care Patient;Family member/caregiver    Family Member Consulted wife, daughter           Patient will benefit from skilled therapeutic intervention in order to improve the following deficits and impairments:   Dysarthria and anarthria    Problem List Patient Active Problem List   Diagnosis Date Noted  . Acute metabolic encephalopathy 123XX123  . Acute on Chronic HypoNatremia 02/21/2020  . HTN (hypertension) 02/16/2020  . SDH (subdural hematoma) ([place]) 02/15/2020  . PSP (progressive supranuclear palsy) (  [place]) 11/15/2019  . Gait abnormality 11/15/2019  . Movement disorder 05/18/2019  . Dementia [facility]) 11/10/2018   Thank you,  Genene Churn, [place]  [facility] 07/31/2020, 10:38 AM  [facility] [address] [place], [place], [address] Phone: [phone]   Fax:  [phone]   Name: WYNSTON ROMEY MRN: [id number] Date of Birth: 1932-10-10

## 2020-08-02 ENCOUNTER — Telehealth (HOSPITAL_COMMUNITY): Payer: Self-pay | Admitting: Speech Pathology

## 2020-08-02 NOTE — Telephone Encounter (Signed)
S/w to call [phone] to check if we are open or closed on Monday  08/05/20 due to winter weather.

## 2020-08-05 ENCOUNTER — Ambulatory Visit (HOSPITAL_COMMUNITY): Payer: Medicare PPO | Admitting: Speech Pathology

## 2020-08-07 ENCOUNTER — Ambulatory Visit (HOSPITAL_COMMUNITY): Payer: Medicare PPO | Admitting: Speech Pathology

## 2020-08-07 DIAGNOSIS — I10 Essential (primary) hypertension: Secondary | ICD-10-CM | POA: Diagnosis not present

## 2020-08-07 DIAGNOSIS — G231 Progressive supranuclear ophthalmoplegia [Steele-Richardson-Olszewski]: Secondary | ICD-10-CM | POA: Diagnosis not present

## 2020-08-07 DIAGNOSIS — G4733 Obstructive sleep apnea (adult) (pediatric): Secondary | ICD-10-CM | POA: Diagnosis not present

## 2020-08-07 DIAGNOSIS — E871 Hypo-osmolality and hyponatremia: Secondary | ICD-10-CM | POA: Diagnosis not present

## 2020-08-12 ENCOUNTER — Other Ambulatory Visit: Payer: Self-pay

## 2020-08-12 ENCOUNTER — Ambulatory Visit (HOSPITAL_COMMUNITY): Payer: Medicare PPO | Admitting: Speech Pathology

## 2020-08-12 ENCOUNTER — Encounter (HOSPITAL_COMMUNITY): Payer: Self-pay | Admitting: Speech Pathology

## 2020-08-12 DIAGNOSIS — R471 Dysarthria and anarthria: Secondary | ICD-10-CM | POA: Diagnosis not present

## 2020-08-12 NOTE — Therapy (Signed)
[facility], [place], [address] Phone: [phone]   Fax:  [phone]  Speech Language Pathology Treatment  Patient Details  Name: Luis Freeman MRN: [id number] Date of Birth: 13-Oct-1932 Referring Provider (SLP): Regis Bill, MD   Encounter Date: 08/12/2020   End of Session - 08/12/20 1454    Visit Number 11    Number of Visits 13    Date for SLP Re-Evaluation 08/08/20    Authorization Type [employer]   no deductible, $20 copay, OOP max $4000, no visit limit, auth required   SLP Start Time [phone]    SLP Stop Time  1030    SLP Time Calculation (min) 38 min    Activity Tolerance Patient tolerated treatment well           Past Medical History:  Diagnosis Date  . Chronic low back pain   . Dementia ([place])   . Gait abnormality 11/15/2019  . Gait disorder   . Hypertension   . PSP (progressive supranuclear palsy) ([place]) 11/15/2019  . Vitamin B12 deficiency     Past Surgical History:  Procedure Laterality Date  . CATARACT EXTRACTION, BILATERAL    . COLONOSCOPY    . COLONOSCOPY  02/04/2011   Procedure: COLONOSCOPY;  Surgeon: Daneil Dolin, MD;  [facility];  Service: Endoscopy;  Laterality: N/A;    There were no vitals filed for this visit.   Subjective Assessment - 08/12/20 1012    Subjective "Good morning!"    Patient is accompained by: Family member    Currently in Pain? No/denies             ADULT SLP TREATMENT - 08/12/20 0001      General Information   Behavior/Cognition Alert;Cooperative;Pleasant mood    Patient Positioning Upright in chair    Oral care provided N/A    HPI Luis Freeman is an 87 y.o. male with a past medical history Cancer (CMS-HCC), cataract surgery, and Hypertension who is referred by Dr. Regis Bill for evaluation of voice, speech, and cognitive screening secondary to diagnosis of progressive supranuclear palsy. Per patient's family, symptoms started 2  years ago. He came under the care of Dr. Regis Bill at [facility] neurology in January of 2021 and was diagnosed with PSP-CBD. Patient's family complains of difficulty understanding him. He denies any swallowing difficulty. Patient lives with his wife Luis Freeman and receives 24-hour care 7 days a week. Patient has experienced several falls in the past year, most recently on July 29 with head injury requiring ED visit. [name] developed a small parafalcine subdural hematoma noted on an ER visit on 08 Dec 2019.  His most recent CT of the brain done and August showed resolution of the small subdural. Pt is known to this SLP from previous treatment for PSP (10/04/19-11/22/2019).      Treatment Provided   Treatment provided Cognitive-Linquistic      Pain Assessment   Pain Assessment No/denies pain      Cognitive-Linquistic Treatment   Treatment focused on Dysarthria;Patient/family/caregiver education;Voice    Skilled Treatment Speakout! treatment protocol, cycles of SLP guided warm up, sustained phonation, glides, counting, reading, and cognitive speech tasks. SLP provided feedback and cues to maximize accuracy/effort.      Assessment / Recommendations / Plan   Plan Continue with current plan of care              SLP Short Term Goals - 08/12/20 1454  SLP SHORT TERM GOAL #1   Title Pt will coordinate vocal and articulatory subsystems in hierarchical speech tasks by producing sounds with intention with mod assistance.    Baseline max assist    Time 6    Period Weeks    Status On-going    Target Date 08/08/20      SLP SHORT TERM GOAL #2   Title Read phrases, sentences, and paragraphs with intention, yielding improved vocal quality, loudness, articulatory precision, and endurance while maintaining a minimum of 75 dB with min assistance.    Baseline 55-62 dB    Time 6    Period Weeks    Status On-going    Target Date 08/08/20      SLP SHORT TERM GOAL #3   Title Generalize intentional speech  to cognitive-linguistic exercises and conversational speech with improved vocal quality, loudness, articulatory precision, and endurance while maintaining a minimum of 75 dB with min assistance.    Baseline max assist for ~58 dB    Time 6    Period Weeks    Status On-going    Target Date 08/08/20            SLP Long Term Goals - 08/12/20 1454      SLP LONG TERM GOAL #1   Title Pt will be able to communicate basic wants and needs with use of intentional speech 80% of the time with mi/mod cues from conversation partner.    Baseline max assist    Time 6    Period Weeks    Status On-going            Plan - 08/12/20 1454    Clinical Impression Statement Pt accompanied to therapy by his caregiver, Luis Freeman and his wife. He had more difficulty completing exercises this date and family reports that he seemed tired at home as well.  SLP faciliated session through implementation of Lesson 8 of the [employer]! workbook. [name] produced the following: conversation 64 dB, warm up 68 dB, /a/ for ~8-10 seconds at 80-85 dB, vocal glides 78 dB with no change in intonation, numerical sequences 69 dB, reading phrases 68 dB, and cognitive task 63 dB. SLP provided an additional carrier phrase of "I see a ___" to facilitate sentence productions. Pt required more cues this date overall. Continue plan of care.    Speech Therapy Frequency 2x / week    Duration --   6 weeks   Treatment/Interventions Compensatory techniques;Functional tasks;Multimodal communcation approach;Cueing hierarchy;SLP instruction and feedback;Patient/family education;Compensatory strategies    Potential to Achieve Goals Fair    Potential Considerations Ability to learn/carryover information;Severity of impairments    SLP Home Exercise Plan Pt will complete HEP as assigned to facilitate carryover of treatment strategies and techniques in home environment with assist from caregivers    Consulted and Agree with Plan of Care Patient;Family  member/caregiver    Family Member Consulted wife, daughter           Patient will benefit from skilled therapeutic intervention in order to improve the following deficits and impairments:   Dysarthria and anarthria    Problem List Patient Active Problem List   Diagnosis Date Noted  . Acute metabolic encephalopathy 02/27/2020  . Acute on Chronic HypoNatremia 02/21/2020  . HTN (hypertension) 02/16/2020  . SDH (subdural hematoma) (HCC) 02/15/2020  . 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 Gait abnormality 11/15/2019  . Movement disorder 05/18/2019  . Dementia (HCC) 11/10/2018   Thank you,  Havery Moros,  CCC-SLP [phone]  Luis Freeman 08/12/2020, 2:55 PM  [facility] [address] [place], [place], [address] Phone: [phone]   Fax:  [phone]   Name: Luis Freeman MRN: [id number] Date of Birth: Apr 01, 1933

## 2020-08-15 ENCOUNTER — Encounter (HOSPITAL_COMMUNITY): Payer: Self-pay | Admitting: Speech Pathology

## 2020-08-15 ENCOUNTER — Ambulatory Visit (HOSPITAL_COMMUNITY): Payer: Medicare PPO | Admitting: Speech Pathology

## 2020-08-15 ENCOUNTER — Other Ambulatory Visit: Payer: Self-pay

## 2020-08-15 DIAGNOSIS — R471 Dysarthria and anarthria: Secondary | ICD-10-CM | POA: Diagnosis not present

## 2020-08-15 NOTE — Therapy (Addendum)
[facility] [place], [place], [address] Phone: [phone]   Fax:  [phone]  Speech Language Pathology Treatment  Patient Details  Name: Luis Freeman MRN: [id number] Date of Birth: 1933-04-17 Referring Provider (SLP): Regis Bill, MD   Encounter Date: 08/15/2020   End of Session - 08/15/20 1412    Visit Number 12    Number of Visits 13    Date for SLP Re-Evaluation 08/22/20    Authorization Type [employer]   no deductible, $20 copay, OOP max $4000, no visit limit, auth required   SLP Start Time 0948    SLP Stop Time  1030    SLP Time Calculation (min) 42 min    Activity Tolerance Patient tolerated treatment well           Past Medical History:  Diagnosis Date  . Chronic low back pain   . Dementia ([place])   . Gait abnormality 11/15/2019  . Gait disorder   . Hypertension   . PSP (progressive supranuclear palsy) ([place]) 11/15/2019  . Vitamin B12 deficiency     Past Surgical History:  Procedure Laterality Date  . CATARACT EXTRACTION, BILATERAL    . COLONOSCOPY    . COLONOSCOPY  02/04/2011   Procedure: COLONOSCOPY;  Surgeon: Daneil Dolin, MD;  [facility];  Service: Endoscopy;  Laterality: N/A;    There were no vitals filed for this visit.   Subjective Assessment - 08/15/20 1006    Subjective "I hope you've had a good day."    Patient is accompained by: Family member    Currently in Pain? No/denies            ADULT SLP TREATMENT - 08/15/20 1035      General Information   Behavior/Cognition Alert;Cooperative;Pleasant mood    Patient Positioning Upright in chair    Oral care provided N/A    HPI Luis Freeman is an 87 y.o. male with a past medical history Cancer (CMS-HCC), cataract surgery, and Hypertension who is referred by Dr. Regis Bill for evaluation of voice, speech, and cognitive screening secondary to diagnosis of progressive supranuclear palsy. Per patient's family,  symptoms started 2 years ago. He came under the care of Dr. Regis Bill at [facility] neurology in January of 2021 and was diagnosed with PSP-CBD. Patient's family complains of difficulty understanding him. He denies any swallowing difficulty. Patient lives with his wife Luis Freeman and receives 24-hour care 7 days a week. Patient has experienced several falls in the past year, most recently on July 29 with head injury requiring ED visit. [name] developed a small parafalcine subdural hematoma noted on an ER visit on 08 Dec 2019.  His most recent CT of the brain done and August showed resolution of the small subdural. Pt is known to this SLP from previous treatment for PSP (10/04/19-11/22/2019).      Treatment Provided   Treatment provided Cognitive-Linquistic      Pain Assessment   Pain Assessment No/denies pain      Cognitive-Linquistic Treatment   Skilled Treatment Speakout! treatment protocol, cycles of SLP guided warm up, sustained phonation, glides, counting, reading, and cognitive speech tasks. SLP provided feedback and cues to maximize accuracy/effort.      Assessment / Recommendations / Plan   Plan Continue with current plan of care              SLP Short Term Goals - 08/15/20 1413      SLP  SHORT TERM GOAL #1   Title Pt will coordinate vocal and articulatory subsystems in hierarchical speech tasks by producing sounds with intention with mod assistance.    Baseline max assist    Time 6    Period Weeks    Status On-going    Target Date 08/29/20      SLP SHORT TERM GOAL #2   Title Read phrases, sentences, and paragraphs with intention, yielding improved vocal quality, loudness, articulatory precision, and endurance while maintaining a minimum of 75 dB with min assistance.    Baseline 55-62 dB    Time 6    Period Weeks    Status On-going    Target Date 08/29/20      SLP SHORT TERM GOAL #3   Title Generalize intentional speech to cognitive-linguistic exercises and conversational  speech with improved vocal quality, loudness, articulatory precision, and endurance while maintaining a minimum of 75 dB with min assistance.    Baseline max assist for ~58 dB    Time 6    Period Weeks    Status On-going    Target Date 08/29/20            SLP Long Term Goals - 08/15/20 1413      SLP LONG TERM GOAL #1   Title Pt will be able to communicate basic wants and needs with use of intentional speech 80% of the time with mi/mod cues from conversation partner.    Baseline max assist    Time 6    Period Weeks    Status On-going    Target Date 08/29/20            Plan - 08/15/20 1413    Clinical Impression Statement Pt accompanied to therapy by his caregiver, Luis Freeman and his wife. Pt with improved attention and alertness this date. Luis Freeman reported that he seemed a little "slower" on Monday and Tuesday this week, but better now. She and his wife also reported that meals have been taking longer and that he seems confused switching from fork, to spoon, to cup. SLP suggested that they provide meals in separate portions so that he is first eating spoonable items and then items to eat with a fork. They worried that he might be getting dehydrated and wondered if his Biotene mouthwash may make him less thirsty. They were experimenting with reducing its use. SLP faciliated session through implementation of [place] 7 of the [facility]! workbook. [name] produced the following: conversation 64 dB, warm up 68 dB, /a/ for ~8-10 seconds at 80-85 dB, vocal glides- eliminated due to difficulty and lack of success, numerical sequences 69 dB, and repeating phrases 68 dB. SLP provided an additional carrier phrase of "I see a ___" to facilitate sentence productions. Pt required more cues this date overall. Continue plan of care for one more session and discharge to home program.   Speech Therapy Frequency 2x / week    Duration --   6 weeks   Treatment/Interventions Compensatory techniques;Functional  tasks;Multimodal communcation approach;Cueing hierarchy;SLP instruction and feedback;Patient/family education;Compensatory strategies    Potential to Achieve Goals Fair    Potential Considerations Ability to learn/carryover information;Severity of impairments    SLP Home Exercise Plan Pt will complete HEP as assigned to facilitate carryover of treatment strategies and techniques in home environment with assist from caregivers    Consulted and Agree with Plan of Care Patient;Family member/caregiver    Family Member Consulted wife, daughter  Patient will benefit from skilled therapeutic intervention in order to improve the following deficits and impairments:   Dysarthria and anarthria    Problem List Patient Active Problem List   Diagnosis Date Noted  . Acute metabolic encephalopathy 00/45/9977  . Acute on Chronic HypoNatremia 02/21/2020  . HTN (hypertension) 02/16/2020  . SDH (subdural hematoma) ([place]) 02/15/2020  . PSP (progressive supranuclear palsy) ([place]) 11/15/2019  . Gait abnormality 11/15/2019  . Movement disorder 05/18/2019  . Dementia [facility]) 11/10/2018   Thank you,  Genene Churn, [place]  [facility] 08/15/2020, 2:53 PM  [facility] [address] [place], [place], [address] Phone: [phone]   Fax:  [phone]   Name: DAKWAN PRIDGEN MRN: [id number] Date of Birth: [birth year]

## 2020-08-19 ENCOUNTER — Encounter: Payer: Self-pay | Admitting: Neurology

## 2020-08-19 ENCOUNTER — Ambulatory Visit: Payer: Medicare PPO | Admitting: Neurology

## 2020-08-19 VITALS — BP 139/74 | Ht 71.5 in | Wt 149.6 lb

## 2020-08-19 DIAGNOSIS — G479 Sleep disorder, unspecified: Secondary | ICD-10-CM | POA: Diagnosis not present

## 2020-08-19 DIAGNOSIS — G231 Progressive supranuclear ophthalmoplegia [Steele-Richardson-Olszewski]: Secondary | ICD-10-CM

## 2020-08-19 MED ORDER — CARBIDOPA-LEVODOPA 25-100 MG PO TABS: 1.5000 | ORAL_TABLET | Freq: Three times a day (TID) | ORAL | 1 refills | Status: DC

## 2020-08-19 NOTE — Progress Notes (Signed)
Reason for visit: Progressive supranuclear palsy, dementia  Luis Freeman is an 87 y.o. male  History of present illness:  Luis Freeman is an 87 year old right-handed white male with a history of progressive supranuclear palsy associated with a gait disorder and multiple falls.  He also has a significant issue with dementia and severe apraxias.  He now has 24/7 supervision, he uses a walker to get around and he has not had any falls recently.  He apparently has had observed sleep apnea and has been set up for a sleep study but the family has not heard anything about scheduling yet.  The continues to take Namenda, and an attempted taper from this medication resulted in worsening cognitive functioning.  The patient is engaging in speech therapy evaluation with some benefit.  He denies any swallowing problems.  There have been no hallucinations.  The family believes that he has gained some improvement in his ability to walk with the use of Sinemet.  He returns for further evaluation.  Past Medical History:  Diagnosis Date  . Chronic low back pain   . Dementia ([place])   . Gait abnormality 11/15/2019  . Gait disorder   . Hypertension   . PSP (progressive supranuclear palsy) ([place]) 11/15/2019  . Vitamin B12 deficiency     Past Surgical History:  Procedure Laterality Date  . CATARACT EXTRACTION, BILATERAL    . COLONOSCOPY    . COLONOSCOPY  02/04/2011   Procedure: COLONOSCOPY;  Surgeon: Daneil Dolin, MD;  [facility];  Service: Endoscopy;  Laterality: N/A;    Family History  Problem Relation Age of Onset  . Diabetes Mother   . Cancer Father   . Cancer - Lung Brother     Social history:  reports that he has never smoked. He has never used smokeless tobacco. He reports previous alcohol use. He reports that he does not use drugs.   No Known Allergies  Medications:  Prior to Admission medications   Medication Sig Start Date End Date Taking? Authorizing Provider  amLODipine  (NORVASC) 10 MG tablet Take 1 tablet (10 mg total) by mouth daily. For BP 02/20/20  Yes Emokpae, Courage, MD  benazepril (LOTENSIN) 40 MG tablet Take 1 tablet (40 mg total) by mouth in the morning. 02/19/20  Yes Emokpae, Courage, MD  carbidopa-levodopa (SINEMET IR) 25-100 MG tablet Take 1.5 tablets by mouth 3 (three) times daily. 05/16/20  Yes Kathrynn Ducking, MD  cholecalciferol (VITAMIN D) 1000 units tablet Take 1,000 Units by mouth in the morning.    Yes [provider]  cyanocobalamin (,VITAMIN B-12,) 1000 MCG/ML injection Inject 1,000 mcg into the muscle every 30 (thirty) days.  08/02/15  Yes [provider]  hydrALAZINE (APRESOLINE) 25 MG tablet Take 1.5 tablets (37.5 mg total) by mouth 2 (two) times daily. 02/19/20  Yes Emokpae, Courage, MD  memantine (NAMENDA) 10 MG tablet Take 10 mg by mouth in the morning.  08/08/19  Yes [provider]  Polyvinyl Alcohol-Povidone PF 1.4-0.6 % SOLN Apply 1 drop to eye daily as needed (FOR DRY EYE RELIEF).    Yes [provider]  Vibegron (GEMTESA) 75 MG TABS Take by mouth.   Yes [provider]  acetaminophen (TYLENOL) 325 MG tablet Take 2 tablets (650 mg total) by mouth every 6 (six) hours as needed for mild pain (or Fever >/= 101). 02/19/20   Denton Brick, Courage, MD    ROS:  Out of a complete 14 system review of  symptoms, the patient complains only of the following symptoms, and all other reviewed systems are negative.  Urinary frequency Memory problems Walking difficulty  Blood pressure 139/74, height 5' 11.5" (1.816 m), weight 149 lb 9.6 oz (67.9 kg).  Physical Exam  General: The patient is alert and cooperative at the time of the examination.  Skin: No significant peripheral edema is noted.   Neurologic Exam  Mental status: The patient is alert and cooperative at the time of examination, he is minimally verbal.   Cranial nerves: Facial symmetry is present. Speech is normal, no aphasia or dysarthria is  noted. Extraocular movements are notable for severe impairment of vertical gaze and to some extent inferior gaze as well.  He is able to generate horizontal gaze. Visual fields are full.  Masking of the face is seen.  Motor: The patient has good strength in all 4 extremities.  Sensory examination: Soft touch sensation is symmetric on the face, arms, and legs.  Coordination: The patient has good finger-nose-finger and heel-to-shin bilaterally, but the patient has severe apraxia with use of all 4 extremities.  Gait and station: The patient requires some assistance with standing.  Once up, he can walk with a walker with fairly good stability.  Reflexes: Deep tendon reflexes are symmetric.   Assessment/Plan:  1.  Progressive supranuclear palsy  2.  Dementia  Fortunately, the patient is not having any hallucinations or agitation.  He has had observed sleep apnea, he will be set up for a sleep study.  The patient has urinary frequency at night, he has to get up every hour to have to urinate, he does have a urinal at bedside.  He will be maintained on the current dose of the Sinemet and follow-up here in 5 months.  Jill Alexanders MD 08/19/2020 12:23 PM  [facility] [address] [place], [place] [address]  Phone [phone] Fax [phone]

## 2020-08-22 ENCOUNTER — Other Ambulatory Visit: Payer: Self-pay

## 2020-08-22 ENCOUNTER — Ambulatory Visit (HOSPITAL_COMMUNITY): Payer: Medicare PPO | Attending: Neurology | Admitting: Speech Pathology

## 2020-08-22 ENCOUNTER — Encounter (HOSPITAL_COMMUNITY): Payer: Self-pay | Admitting: Speech Pathology

## 2020-08-22 DIAGNOSIS — R471 Dysarthria and anarthria: Secondary | ICD-10-CM | POA: Insufficient documentation

## 2020-08-22 NOTE — Therapy (Signed)
[place], [place], [address] Phone: [phone]   Fax:  [phone]  Speech Language Pathology Treatment  Patient Details  Name: Luis Freeman MRN: [id number] Date of Birth: October 16, 1932 Referring Provider (SLP): Regis Bill, MD   Encounter Date: 08/22/2020   End of Session - 08/22/20 1145    Visit Number 13    Number of Visits 14    Date for SLP Re-Evaluation 10/10/20   refresher scheduled for 10/03/20   Authorization Type [employer]   no deductible, $20 copay, OOP max $4000, no visit limit, auth required   SLP Start Time 0945    SLP Stop Time  1030    SLP Time Calculation (min) 45 min    Activity Tolerance Patient tolerated treatment well           Past Medical History:  Diagnosis Date  . Chronic low back pain   . Dementia ([place])   . Gait abnormality 11/15/2019  . Gait disorder   . Hypertension   . PSP (progressive supranuclear palsy) ([place]) 11/15/2019  . Vitamin B12 deficiency     Past Surgical History:  Procedure Laterality Date  . CATARACT EXTRACTION, BILATERAL    . COLONOSCOPY    . COLONOSCOPY  02/04/2011   Procedure: COLONOSCOPY;  Surgeon: Daneil Dolin, MD;  [facility];  Service: Endoscopy;  Laterality: N/A;    There were no vitals filed for this visit.   Subjective Assessment - 08/22/20 0949    Subjective "Good morning."    Patient is accompained by: Family member    Currently in Pain? No/denies             ADULT SLP TREATMENT - 08/22/20 0001      General Information   Behavior/Cognition Alert;Cooperative;Pleasant mood    Patient Positioning Upright in chair    Oral care provided N/A    HPI Luis Freeman is an 87 y.o. male with a past medical history Cancer (CMS-HCC), cataract surgery, and Hypertension who is referred by Dr. Regis Bill for evaluation of voice, speech, and cognitive screening secondary to diagnosis of progressive supranuclear palsy. Per  patient's family, symptoms started 2 years ago. He came under the care of Dr. Regis Bill at [facility] neurology in January of 2021 and was diagnosed with PSP-CBD. Patient's family complains of difficulty understanding him. He denies any swallowing difficulty. Patient lives with his wife Luis Freeman and receives 24-hour care 7 days a week. Patient has experienced several falls in the past year, most recently on July 29 with head injury requiring ED visit. Luis Freeman developed a small parafalcine subdural hematoma noted on an ER visit on 08 Dec 2019.  His most recent CT of the brain done and August showed resolution of the small subdural. Pt is known to this SLP from previous treatment for PSP (10/04/19-11/22/2019).      Treatment Provided   Treatment provided Cognitive-Linquistic      Pain Assessment   Pain Assessment No/denies pain      Cognitive-Linquistic Treatment   Treatment focused on Dysarthria;Patient/family/caregiver education;Voice    Skilled Treatment Speakout! treatment protocol, cycles of SLP guided warm up, sustained phonation, glides, counting, reading, and cognitive speech tasks. SLP provided feedback and cues to maximize accuracy/effort. Exit interview completed and plan for f/u for refresher in 6 weeks.      Assessment / Recommendations / Plan   Plan Goals updated   f/u in 6 weeks for a refresher  SLP Education - 08/22/20 1139    Education Details Pt to return in 6 weeks for a refresher; continue with [place]! protocol at home 2x/day in modified format as discussed with family    Person(s) Educated Patient;Spouse;Caregiver(s)    Methods Explanation    Comprehension Verbalized understanding            SLP Short Term Goals - 08/22/20 1154      SLP SHORT TERM GOAL #1   Title Pt will coordinate vocal and articulatory subsystems in hierarchical speech tasks by producing sounds with intention with mod assistance.    Baseline max assist    Time 6    Period Weeks    Status  Partially Met    Target Date 08/29/20      SLP SHORT TERM GOAL #2   Title Read phrases, sentences, and paragraphs with intention, yielding improved vocal quality, loudness, articulatory precision, and endurance while maintaining a minimum of 75 dB with min assistance.    Baseline 55-62 dB    Time 6    Period Weeks    Status Not Met    Target Date 08/29/20      SLP SHORT TERM GOAL #3   Title Generalize intentional speech to cognitive-linguistic exercises and conversational speech with improved vocal quality, loudness, articulatory precision, and endurance while maintaining a minimum of 75 dB with min assistance.    Baseline max assist for ~58 dB    Time 6    Period Weeks    Status Not Met    Target Date 08/29/20      SLP SHORT TERM GOAL #4   Title Pt will participate in refresher [place]! session in 6 weeks prior to d/c for 6 months    Time 6    Period Weeks    Status New    Target Date 10/10/20            SLP Long Term Goals - 08/22/20 1156      SLP LONG TERM GOAL #1   Title Pt will be able to communicate basic wants and needs with use of intentional speech 80% of the time with mi/mod cues from conversation partner.    Baseline max assist    Time 6    Period Weeks    Status Partially Met            Plan - 08/22/20 1154    Clinical Impression Statement Pt accompanied to therapy by his caregiver, Luis Freeman and his wife. Pt with reduced attention and alertness this date. Exit interview completed this date and unfortunately, this wasn't a "good" day and speech intelligibility and his ability to answer questions was more impaired than usual. Each value lists initial and current: duration of /a/ 3, now 8 seconds, /a/ dB 61, now 84 dB, reading dB 67, now 68 dB, and conversation 62 dB, now 65 dB. Pt with good progress in sustained phonation of /a/ and increased vocal intensity, however limited progress noted in reading tasks and conversation. Suspect this is largely due to cognitive  deficits and reduced attention. Subjectively, family has noted improved speech intelligibility at home. Pt requires extra time to process and verbally express responses. Recommend family to continue with lessons 1-6 at home 1-2x per day going forward and attend one follow up with me in 6 weeks. Pt and family in agreement with plan of care. Plan for refresher 10/03/2020.    Speech Therapy Frequency 1x /week    Duration One additional visit  follow up in 6 weeks and then discharge   Treatment/Interventions Compensatory techniques;Functional tasks;Multimodal communcation approach;Cueing hierarchy;SLP instruction and feedback;Patient/family education;Compensatory strategies    Potential to Achieve Goals Fair    Potential Considerations Ability to learn/carryover information;Severity of impairments    SLP Home Exercise Plan Pt will complete HEP as assigned to facilitate carryover of treatment strategies and techniques in home environment with assist from caregivers    Consulted and Agree with Plan of Care Patient;Family member/caregiver    Family Member Consulted wife, daughter           Patient will benefit from skilled therapeutic intervention in order to improve the following deficits and impairments:   Dysarthria and anarthria    Problem List Patient Active Problem List   Diagnosis Date Noted  . Acute metabolic encephalopathy 50/38/8828  . Acute on Chronic HypoNatremia 02/21/2020  . HTN (hypertension) 02/16/2020  . SDH (subdural hematoma) ([place]) 02/15/2020  . PSP (progressive supranuclear palsy) ([place]) 11/15/2019  . Gait abnormality 11/15/2019  . Movement disorder 05/18/2019  . Dementia [facility]) 11/10/2018   Thank you,  Genene Churn, [facility] 08/22/2020, 11:57 AM  [place] [address] [place], [place], [address] Phone: [phone]   Fax:  [phone]   Name: Luis Freeman MRN: [id number] Date of Birth:  02-Jul-1933

## 2020-08-24 DIAGNOSIS — S065X0D Traumatic subdural hemorrhage without loss of consciousness, subsequent encounter: Secondary | ICD-10-CM | POA: Diagnosis not present

## 2020-08-26 ENCOUNTER — Encounter (HOSPITAL_COMMUNITY): Payer: Medicare PPO | Admitting: Speech Pathology

## 2020-08-27 DIAGNOSIS — D519 Vitamin B12 deficiency anemia, unspecified: Secondary | ICD-10-CM | POA: Diagnosis not present

## 2020-08-30 DIAGNOSIS — N3000 Acute cystitis without hematuria: Secondary | ICD-10-CM | POA: Diagnosis not present

## 2020-09-12 ENCOUNTER — Other Ambulatory Visit: Payer: Self-pay

## 2020-09-12 ENCOUNTER — Encounter: Payer: Self-pay | Admitting: Neurology

## 2020-09-12 ENCOUNTER — Ambulatory Visit: Payer: Medicare PPO | Admitting: Neurology

## 2020-09-12 VITALS — BP 116/68 | HR 96 | Ht 71.5 in | Wt 151.0 lb

## 2020-09-12 DIAGNOSIS — R269 Unspecified abnormalities of gait and mobility: Secondary | ICD-10-CM

## 2020-09-12 DIAGNOSIS — Z9181 History of falling: Secondary | ICD-10-CM

## 2020-09-12 DIAGNOSIS — R471 Dysarthria and anarthria: Secondary | ICD-10-CM | POA: Diagnosis not present

## 2020-09-12 DIAGNOSIS — G231 Progressive supranuclear ophthalmoplegia [Steele-Richardson-Olszewski]: Secondary | ICD-10-CM

## 2020-09-12 DIAGNOSIS — R351 Nocturia: Secondary | ICD-10-CM | POA: Diagnosis not present

## 2020-09-12 NOTE — Patient Instructions (Signed)

## 2020-09-12 NOTE — Progress Notes (Signed)
SLEEP MEDICINE CLINIC    Provider:  Larey Seat, MD  Primary Care Physician:  [name], MD [address] [place] [address]     Referring Provider: Dr Jannifer Franklin, MD at [facility]    Primary neurologist Dr. Lindwood Qua at [facility] Complaint according to patient   Patient presents with:    . New Patient (Initial Visit)           HISTORY OF PRESENT ILLNESS:  Luis Freeman is a 87 y.o. White or Caucasian male patient of Dr Jannifer Franklin and is also seen by [facility] department of Neurology. Movement Disorder Specialist. She established the diagnosis as Progressive Supranuclear Palsy. He was diagnosed by Dr. Jannifer Franklin as having dementia, he is incontinent and in diapers, he has great difficulties speaking and walking, he fell frequently due to PSP.  He is at an unusual age to manifest with this. Palliative care has been considered.  Pt with daughters, rm 21. Pt lives home and has around the clock care. They have witnessed apnea, states he snores. He wakes up frequently to void during the night which has improved slightly with medications but still very tired during the day and naps throughout the day. They just want to evaluate OSA a concern and if so best treatment  I see him for a sleep consultation-  09/12/2020 . Chief concern according to patient : daughters want a sleep evaluation after speech and swallowing evaluation at [facility]- found normal swallowing and considered him a candidate to be evluated for apnea. Daughter made a video recording of his sleep breathing- clearly central apnea, no snoring , just gasping after a long pause. Not a obstructive apnea.    Luis Freeman has a past medical history of Chronic low back pain, Dementia ([place]), Gait abnormality (11/15/2019), Gait disorder, Hypertension, PSP (progressive supranuclear palsy) ([place]) (11/15/2019), and Vitamin B12 deficiency..     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     Sleep habits : The patient's dinner time is between 5-6 PM. The patient goes to bed at 8-9.30 PM , his routine requires assistance and is long- he is in bed by 10- Pm and sleeps with gasping sounds, but doesn'Freeman wake up - he has   bathroom breaks, uses a urinal many times at home. Every 2 hours or less- very frequent.  The preferred sleep position is supine , with the support of 1 pillow, hospital bed- elevated-  or restored in AM, with symptoms such as dry mouth. Naps are taken very frequently, in his recliner -  lasting from 2 hours on after breakfast and after lunch.    .  Review of Systems: Out of a complete 14 system review, the patient complains of only the following symptoms, and all other reviewed systems are negative.:  Fatigue, sleepiness , snoring, fragmented sleep, central apneas, nocturia.    How likely are you to doze in the following situations: 0 = not likely, 1 = slight chance, 2 = moderate chance, 3 = high chance   Sitting and Reading? Watching Television? Sitting inactive in a public place (theater or meeting)? As a passenger in a car for an hour without a break? Lying down in the afternoon when circumstances permit? Sitting and talking to  someone? Sitting quietly after lunch without alcohol? In a car, while stopped for a few minutes in traffic?   Total = 18-20/ 24 points   FSS endorsed at ?/ 63 points.   Social History   Socioeconomic History  . Marital status: Married    Spouse name: Not on file  . Number of children: Not on file  . Years of education: Not on file  . Highest education level: Not on file  Occupational History  . Not on file  Tobacco Use  . Smoking status: Never Smoker  . Smokeless tobacco: Never Used  Vaping Use  . Vaping Use: Never used  Substance and Sexual Activity  . Alcohol use: Not Currently     Alcohol/week: 0.0 standard drinks  . Drug use: No  . Sexual activity: Not on file  Other Topics Concern  . Not on file  Social History Narrative   Lives with wife and caregivers 24/7   Right handed   Drinks 1-2 cups caffeine daily   Social Determinants of Health   Financial Resource Strain: Not on file  Food Insecurity: Not on file  Transportation Needs: Not on file  Physical Activity: Not on file  Stress: Not on file  Social Connections: Not on file    Family History  Problem Relation Age of Onset  . Diabetes Mother   . Cancer Father   . Cancer - Lung Brother     Past Medical History:  Diagnosis Date  . Chronic low back pain   . Dementia ([place])   . Gait abnormality 11/15/2019  . Gait disorder   . Hypertension   . PSP (progressive supranuclear palsy) ([place]) 11/15/2019  . Vitamin B12 deficiency     Past Surgical History:  Procedure Laterality Date  . CATARACT EXTRACTION, BILATERAL    . COLONOSCOPY    . COLONOSCOPY  02/04/2011   Procedure: COLONOSCOPY;  Surgeon: Daneil Dolin, MD;  Location[facility];  Service: Endoscopy;  Laterality: N/A;     Current Outpatient Medications on File Prior to Visit  Medication Sig Dispense Refill  . acetaminophen (TYLENOL) 325 MG tablet Take 2 tablets (650 mg total) by mouth every 6 (six) hours as needed for mild pain (or Fever >/= 101). 12 tablet 0  . amLODipine (NORVASC) 10 MG tablet Take 1 tablet (10 mg total) by mouth daily. For BP 30 tablet 3  . benazepril (LOTENSIN) 40 MG tablet Take 1 tablet (40 mg total) by mouth in the morning. 30 tablet 3  . carbidopa-levodopa (SINEMET IR) 25-100 MG tablet Take 1.5 tablets by mouth 3 (three) times daily. 405 tablet 1  . cholecalciferol (VITAMIN D) 1000 units tablet Take 1,000 Units by mouth in the morning.     . cyanocobalamin (,VITAMIN B-12,) 1000 MCG/ML injection Inject 1,000 mcg into the muscle every 30 (thirty) days.   1  . hydrALAZINE (APRESOLINE) 25 MG tablet Take 1.5 tablets  (37.5 mg total) by mouth 2 (two) times daily. 90 tablet 3  . memantine (NAMENDA) 10 MG tablet Take 10 mg by mouth daily with lunch.    . Polyvinyl Alcohol-Povidone PF 1.4-0.6 % SOLN Apply 1 drop to eye daily as needed (FOR DRY EYE RELIEF).     . Vibegron (GEMTESA) 75 MG TABS Take by mouth.    Marland Kitchen MYRBETRIQ 50 MG TB24 tablet Take 50 mg by mouth daily.     No current facility-administered medications on file prior to visit.    No Known Allergies  Physical  exam:  Today's Vitals   09/12/20 1121  BP: 116/68  Pulse: 96  Weight: 151 lb (68.5 kg)  Height: 5' 11.5" (1.816 m)   Body mass index is 20.77 kg/m.   Wt Readings from Last 3 Encounters:  09/12/20 151 lb (68.5 kg)  08/19/20 149 lb 9.6 oz (67.9 kg)  04/02/20 152 lb (68.9 kg)     Ht Readings from Last 3 Encounters:  09/12/20 5' 11.5" (1.816 m)  08/19/20 5' 11.5" (1.816 m)  04/02/20 5\' 10"  (1.778 m)      General: The patient is awake, his facial expression is worried, he closes his eyes when asked to open them, his speech is dysarthric, he doesn'Freeman cough, appears not in acute distress. The patient is well groomed. supple. Mallampati 2,  neck circumference: 16 inches . Nasal airflow patent.  Dentures, hearing aids in place.  Cardiovascular:  Regular rate and cardiac rhythm by pulse,  without distended neck veins. Respiratory: Lungs are clear to auscultation.  Skin:  Without evidence of ankle edema, or rash. Trunk: The patient's posture is erect.   Neurologic exam : The patient is awake and alert, oriented to place and time.   Memory subjective described as intact.  Attention span & concentration ability appears normal.  Speech is fluent,  without  dysarthria, dysphonia or aphasia.  Mood and affect are appropriate.   Cranial nerves: no loss of smell or taste reported  Pupils are equal and briskly reactive to light. Funduscopic exam impossible - he clamps his eyes shut.  Extraocular movements in vertical and horizontal planes  were severely restricted. Visual field evaluation  by finger perimetry was not possible  Hearing was intact to tuning fors with hearing aids.   Facial sensation intact to fine touch.  Facial motor ; masked.  Neck ROM : rotation, tilt and flexion extension were restrcited.    Motor exam:  Symmetric bulk, tone and ROM.   Normal tone without cog wheeling, symmetric grip strength .   Sensory:   pinprick and vibration were  normal.  Proprioception tested in the upper extremities was impaired    Coordination: Rapid alternating movements in the fingers/hands were of severely reduced speed.        After spending a total time of 50  minutes face to face and additional time for physical and neurologic examination, review of laboratory studies,  personal review of imaging studies, reports and results of other testing and review of referral information / records as far as provided in visit, I have established the following assessments:  1) Luis Freeman presents here with his 2 daughters, he lives in his private home with his wife and has around-the-clock care.  He has significantly fragmented sleep due to frequent bathroom breaks, he needs to urinate about every 9220 minutes which awakens him from his sleep.  At home he has a hospital bed and and he can use a urinal which has limited the diameter at the duration he has to be awake, but his sleep seems to be much less restful.  He is excessively sleepy in daytime and when he is at rest and in a comfortable position he will fall asleep.  Question after meals this is almost always a 2-hour nap.  He had a full speech and swallowing eval and swallowing evaluation with the [facility] specialist and his swallowing capacity is intact. His doctors have mentioned that he seems to be excessively daytime sleepy and I have witnessed apnea.  I was able today  to watch a video recording and the patient actually does not snore he only gasps in the moment he wakes up or starts to  breathe again after a long pause sometimes 40 seconds.   These pauses are not preceded by crescendo snoring as I would expect for for obstructive sleep.    My Plan is to proceed with:  1) he can only be tested by HST, has a home hospital bed. We will meet again only if there is a treatable sleep disorder .    I would like to thank dr Jannifer Franklin and [facility] -  for allowing me to meet with and to take care of this pleasant patient.    Electronically signed by: Larey Seat, MD 09/12/2020 11:41 AM  [facility] Board certified by The [employer] of Sleep Medicine and Diplomate of the [employer] of Sleep Medicine. Board certified In Neurology through the [place], Fellow of the [employer] of Neurology. Medical Director of [facility].

## 2020-09-13 DIAGNOSIS — E871 Hypo-osmolality and hyponatremia: Secondary | ICD-10-CM | POA: Diagnosis not present

## 2020-09-13 DIAGNOSIS — I10 Essential (primary) hypertension: Secondary | ICD-10-CM | POA: Diagnosis not present

## 2020-09-13 DIAGNOSIS — Z79899 Other long term (current) drug therapy: Secondary | ICD-10-CM | POA: Diagnosis not present

## 2020-09-13 DIAGNOSIS — G231 Progressive supranuclear ophthalmoplegia [Steele-Richardson-Olszewski]: Secondary | ICD-10-CM | POA: Diagnosis not present

## 2020-09-16 DIAGNOSIS — H16223 Keratoconjunctivitis sicca, not specified as Sjogren's, bilateral: Secondary | ICD-10-CM | POA: Diagnosis not present

## 2020-09-18 ENCOUNTER — Telehealth: Payer: Self-pay

## 2020-09-18 NOTE — Telephone Encounter (Signed)
LVM for pt to call me back to schedule sleep study  

## 2020-09-20 DIAGNOSIS — E871 Hypo-osmolality and hyponatremia: Secondary | ICD-10-CM | POA: Diagnosis not present

## 2020-09-20 DIAGNOSIS — I10 Essential (primary) hypertension: Secondary | ICD-10-CM | POA: Diagnosis not present

## 2020-09-20 DIAGNOSIS — G231 Progressive supranuclear ophthalmoplegia [Steele-Richardson-Olszewski]: Secondary | ICD-10-CM | POA: Diagnosis not present

## 2020-09-21 DIAGNOSIS — S065X0D Traumatic subdural hemorrhage without loss of consciousness, subsequent encounter: Secondary | ICD-10-CM | POA: Diagnosis not present

## 2020-10-03 ENCOUNTER — Ambulatory Visit (HOSPITAL_COMMUNITY): Payer: Medicare PPO | Admitting: Speech Pathology

## 2020-10-03 DIAGNOSIS — R3915 Urgency of urination: Secondary | ICD-10-CM | POA: Diagnosis not present

## 2020-10-04 ENCOUNTER — Encounter (HOSPITAL_COMMUNITY): Payer: Self-pay | Admitting: Emergency Medicine

## 2020-10-04 ENCOUNTER — Ambulatory Visit (INDEPENDENT_AMBULATORY_CARE_PROVIDER_SITE_OTHER): Payer: Medicare PPO

## 2020-10-04 ENCOUNTER — Emergency Department (HOSPITAL_COMMUNITY): Payer: Medicare PPO

## 2020-10-04 ENCOUNTER — Ambulatory Visit
Admission: EM | Admit: 2020-10-04 | Discharge: 2020-10-04 | Disposition: A | Discharge status: Home / Self Care | Payer: Medicare PPO | Attending: Emergency Medicine | Admitting: Emergency Medicine

## 2020-10-04 ENCOUNTER — Other Ambulatory Visit: Payer: Self-pay

## 2020-10-04 ENCOUNTER — Inpatient Hospital Stay (HOSPITAL_COMMUNITY)
Admission: EM | Admit: 2020-10-04 | Discharge: 2020-10-11 | DRG: 640 | Disposition: A | Discharge status: Skilled Nursing Facility | Payer: Medicare PPO | Source: Ambulatory Visit | Attending: Family Medicine | Admitting: Family Medicine

## 2020-10-04 DIAGNOSIS — R140 Abdominal distension (gaseous): Secondary | ICD-10-CM

## 2020-10-04 DIAGNOSIS — J90 Pleural effusion, not elsewhere classified: Secondary | ICD-10-CM

## 2020-10-04 DIAGNOSIS — N319 Neuromuscular dysfunction of bladder, unspecified: Secondary | ICD-10-CM | POA: Diagnosis not present

## 2020-10-04 DIAGNOSIS — G301 Alzheimer's disease with late onset: Secondary | ICD-10-CM

## 2020-10-04 DIAGNOSIS — G231 Progressive supranuclear ophthalmoplegia [Steele-Richardson-Olszewski]: Secondary | ICD-10-CM | POA: Diagnosis present

## 2020-10-04 DIAGNOSIS — R160 Hepatomegaly, not elsewhere classified: Secondary | ICD-10-CM

## 2020-10-04 DIAGNOSIS — K5981 Ogilvie syndrome: Secondary | ICD-10-CM | POA: Diagnosis present

## 2020-10-04 DIAGNOSIS — K5900 Constipation, unspecified: Secondary | ICD-10-CM | POA: Diagnosis not present

## 2020-10-04 DIAGNOSIS — R2681 Unsteadiness on feet: Secondary | ICD-10-CM | POA: Diagnosis present

## 2020-10-04 DIAGNOSIS — R933 Abnormal findings on diagnostic imaging of other parts of digestive tract: Secondary | ICD-10-CM

## 2020-10-04 DIAGNOSIS — K592 Neurogenic bowel, not elsewhere classified: Secondary | ICD-10-CM | POA: Diagnosis present

## 2020-10-04 DIAGNOSIS — N401 Enlarged prostate with lower urinary tract symptoms: Secondary | ICD-10-CM | POA: Diagnosis present

## 2020-10-04 DIAGNOSIS — F0280 Dementia in other diseases classified elsewhere without behavioral disturbance: Secondary | ICD-10-CM | POA: Diagnosis present

## 2020-10-04 DIAGNOSIS — K56699 Other intestinal obstruction unspecified as to partial versus complete obstruction: Secondary | ICD-10-CM | POA: Diagnosis not present

## 2020-10-04 DIAGNOSIS — N3281 Overactive bladder: Secondary | ICD-10-CM | POA: Diagnosis present

## 2020-10-04 DIAGNOSIS — Z9181 History of falling: Secondary | ICD-10-CM

## 2020-10-04 DIAGNOSIS — Z9889 Other specified postprocedural states: Secondary | ICD-10-CM | POA: Diagnosis not present

## 2020-10-04 DIAGNOSIS — F0390 Unspecified dementia without behavioral disturbance: Secondary | ICD-10-CM | POA: Diagnosis not present

## 2020-10-04 DIAGNOSIS — Z833 Family history of diabetes mellitus: Secondary | ICD-10-CM

## 2020-10-04 DIAGNOSIS — E538 Deficiency of other specified B group vitamins: Secondary | ICD-10-CM | POA: Diagnosis present

## 2020-10-04 DIAGNOSIS — R269 Unspecified abnormalities of gait and mobility: Secondary | ICD-10-CM | POA: Diagnosis not present

## 2020-10-04 DIAGNOSIS — R338 Other retention of urine: Secondary | ICD-10-CM | POA: Diagnosis present

## 2020-10-04 DIAGNOSIS — K769 Liver disease, unspecified: Secondary | ICD-10-CM | POA: Diagnosis not present

## 2020-10-04 DIAGNOSIS — R296 Repeated falls: Secondary | ICD-10-CM | POA: Diagnosis present

## 2020-10-04 DIAGNOSIS — Z20822 Contact with and (suspected) exposure to covid-19: Secondary | ICD-10-CM | POA: Diagnosis present

## 2020-10-04 DIAGNOSIS — Z7189 Other specified counseling: Secondary | ICD-10-CM | POA: Diagnosis not present

## 2020-10-04 DIAGNOSIS — C787 Secondary malignant neoplasm of liver and intrahepatic bile duct: Secondary | ICD-10-CM | POA: Diagnosis present

## 2020-10-04 DIAGNOSIS — Z66 Do not resuscitate: Secondary | ICD-10-CM | POA: Diagnosis present

## 2020-10-04 DIAGNOSIS — J948 Other specified pleural conditions: Secondary | ICD-10-CM | POA: Diagnosis not present

## 2020-10-04 DIAGNOSIS — E871 Hypo-osmolality and hyponatremia: Secondary | ICD-10-CM | POA: Diagnosis not present

## 2020-10-04 DIAGNOSIS — G20 Parkinson's disease: Secondary | ICD-10-CM | POA: Diagnosis present

## 2020-10-04 DIAGNOSIS — Z85820 Personal history of malignant melanoma of skin: Secondary | ICD-10-CM

## 2020-10-04 DIAGNOSIS — G259 Extrapyramidal and movement disorder, unspecified: Secondary | ICD-10-CM | POA: Diagnosis present

## 2020-10-04 DIAGNOSIS — R935 Abnormal findings on diagnostic imaging of other abdominal regions, including retroperitoneum: Secondary | ICD-10-CM

## 2020-10-04 DIAGNOSIS — Z801 Family history of malignant neoplasm of trachea, bronchus and lung: Secondary | ICD-10-CM

## 2020-10-04 DIAGNOSIS — D72829 Elevated white blood cell count, unspecified: Secondary | ICD-10-CM | POA: Diagnosis present

## 2020-10-04 DIAGNOSIS — I10 Essential (primary) hypertension: Secondary | ICD-10-CM | POA: Diagnosis present

## 2020-10-04 DIAGNOSIS — Z515 Encounter for palliative care: Secondary | ICD-10-CM | POA: Diagnosis not present

## 2020-10-04 DIAGNOSIS — R531 Weakness: Secondary | ICD-10-CM | POA: Diagnosis not present

## 2020-10-04 DIAGNOSIS — K5909 Other constipation: Secondary | ICD-10-CM | POA: Diagnosis present

## 2020-10-04 DIAGNOSIS — J9811 Atelectasis: Secondary | ICD-10-CM | POA: Diagnosis not present

## 2020-10-04 DIAGNOSIS — G9341 Metabolic encephalopathy: Secondary | ICD-10-CM | POA: Diagnosis present

## 2020-10-04 DIAGNOSIS — R339 Retention of urine, unspecified: Secondary | ICD-10-CM | POA: Diagnosis not present

## 2020-10-04 DIAGNOSIS — Z79899 Other long term (current) drug therapy: Secondary | ICD-10-CM

## 2020-10-04 DIAGNOSIS — R4182 Altered mental status, unspecified: Secondary | ICD-10-CM | POA: Diagnosis not present

## 2020-10-04 DIAGNOSIS — K7290 Hepatic failure, unspecified without coma: Secondary | ICD-10-CM | POA: Diagnosis not present

## 2020-10-04 DIAGNOSIS — K7689 Other specified diseases of liver: Secondary | ICD-10-CM | POA: Diagnosis not present

## 2020-10-04 LAB — COMPREHENSIVE METABOLIC PANEL
ALT: 8 U/L (ref 0–44)
AST: 29 U/L (ref 15–41)
Albumin: 3.5 g/dL (ref 3.5–5.0)
Alkaline Phosphatase: 134 U/L — ABNORMAL HIGH (ref 38–126)
Anion gap: 9 (ref 5–15)
BUN: 18 mg/dL (ref 8–23)
CO2: 22 mmol/L (ref 22–32)
Calcium: 8.5 mg/dL — ABNORMAL LOW (ref 8.9–10.3)
Chloride: 90 mmol/L — ABNORMAL LOW (ref 98–111)
Creatinine, Ser: 0.77 mg/dL (ref 0.61–1.24)
GFR, Estimated: >60 mL/min (ref >60)
Glucose, Bld: 104 mg/dL — ABNORMAL HIGH (ref 70–99)
Potassium: 4.2 mmol/L (ref 3.5–5.1)
Sodium: 121 mmol/L — ABNORMAL LOW (ref 135–145)
Total Bilirubin: 0.6 mg/dL (ref 0.3–1.2)
Total Protein: 7.1 g/dL (ref 6.5–8.1)

## 2020-10-04 LAB — CBC WITH DIFFERENTIAL/PLATELET
Abs Immature Granulocytes: 0.15 10*3/uL — ABNORMAL HIGH (ref 0.00–0.07)
Basophils Absolute: 0.1 10*3/uL (ref 0.0–0.1)
Basophils Relative: 0 %
Eosinophils Absolute: 0 10*3/uL (ref 0.0–0.5)
Eosinophils Relative: 0 %
HCT: 38.8 % — ABNORMAL LOW (ref 39.0–52.0)
Hemoglobin: 12.9 g/dL — ABNORMAL LOW (ref 13.0–17.0)
Immature Granulocytes: 1 %
Lymphocytes Relative: 4 %
Lymphs Abs: 0.4 10*3/uL — ABNORMAL LOW (ref 0.7–4.0)
MCH: 31.9 pg (ref 26.0–34.0)
MCHC: 33.2 g/dL (ref 30.0–36.0)
MCV: 96 fL (ref 80.0–100.0)
Monocytes Absolute: 1.1 10*3/uL — ABNORMAL HIGH (ref 0.1–1.0)
Monocytes Relative: 9 %
Neutro Abs: 10.3 10*3/uL — ABNORMAL HIGH (ref 1.7–7.7)
Neutrophils Relative %: 86 %
Platelets: 324 10*3/uL (ref 150–400)
RBC: 4.04 MIL/uL — ABNORMAL LOW (ref 4.22–5.81)
RDW: 12.7 % (ref 11.5–15.5)
WBC: 12 10*3/uL — ABNORMAL HIGH (ref 4.0–10.5)
nRBC: 0 % (ref 0.0–0.2)

## 2020-10-04 LAB — URINALYSIS, ROUTINE W REFLEX MICROSCOPIC
Bacteria, UA: NONE SEEN
Bilirubin Urine: NEGATIVE
Glucose, UA: NEGATIVE mg/dL
Hgb urine dipstick: NEGATIVE
Ketones, ur: 5 mg/dL — AB
Leukocytes,Ua: NEGATIVE
Nitrite: POSITIVE — AB
Protein, ur: NEGATIVE mg/dL
Specific Gravity, Urine: 1.013 (ref 1.005–1.030)
pH: 6 (ref 5.0–8.0)

## 2020-10-04 LAB — LACTIC ACID, PLASMA: Lactic Acid, Venous: 1.4 mmol/L (ref 0.5–1.9)

## 2020-10-04 LAB — SODIUM, URINE, RANDOM: Sodium, Ur: 17 mmol/L

## 2020-10-04 LAB — CK: Total CK: 46 U/L — ABNORMAL LOW (ref 49–397)

## 2020-10-04 LAB — BRAIN NATRIURETIC PEPTIDE: B Natriuretic Peptide: 93 pg/mL (ref 0.0–100.0)

## 2020-10-04 MED ORDER — HYDRALAZINE HCL 25 MG PO TABS: 37.5000 mg | ORAL_TABLET | Freq: Two times a day (BID) | ORAL | Status: DC

## 2020-10-04 MED ORDER — MEMANTINE HCL 10 MG PO TABS
10.0000 mg | ORAL_TABLET | Freq: Every day | ORAL | Status: DC
  Administered 2020-10-05 – 2020-10-11 (×6): 10 mg via ORAL
  Filled 2020-10-04 (×7): qty 1

## 2020-10-04 MED ORDER — BISACODYL 10 MG RE SUPP: 10.0000 mg | Freq: Two times a day (BID) | RECTAL | Status: DC

## 2020-10-04 MED ORDER — IOHEXOL 300 MG/ML  SOLN
100.0000 mL | Freq: Once | INTRAMUSCULAR | Status: AC | PRN
  Administered 2020-10-04: 100 mL via INTRAVENOUS

## 2020-10-04 MED ORDER — CARBIDOPA-LEVODOPA 25-100 MG PO TABS
1.5000 | ORAL_TABLET | Freq: Three times a day (TID) | ORAL | Status: DC
  Administered 2020-10-05 – 2020-10-11 (×17): 1.5 via ORAL
  Filled 2020-10-04 (×19): qty 2

## 2020-10-04 MED ORDER — POLYETHYLENE GLYCOL 3350 17 G PO PACK
17.0000 g | PACK | Freq: Two times a day (BID) | ORAL | Status: DC
  Administered 2020-10-05 (×2): 17 g via ORAL
  Filled 2020-10-04 (×2): qty 1

## 2020-10-04 MED ORDER — VITAMIN D 25 MCG (1000 UNIT) PO TABS
1000.0000 [IU] | ORAL_TABLET | Freq: Every morning | ORAL | Status: DC
  Administered 2020-10-05 – 2020-10-11 (×6): 1000 [IU] via ORAL
  Filled 2020-10-04 (×7): qty 1

## 2020-10-04 MED ORDER — BISACODYL 5 MG PO TBEC: 10.0000 mg | DELAYED_RELEASE_TABLET | Freq: Two times a day (BID) | ORAL | Status: DC

## 2020-10-04 MED ORDER — BENAZEPRIL HCL 10 MG PO TABS
40.0000 mg | ORAL_TABLET | Freq: Every morning | ORAL | Status: DC
  Administered 2020-10-05 – 2020-10-11 (×6): 40 mg via ORAL
  Filled 2020-10-04 (×7): qty 4

## 2020-10-04 MED ORDER — HYDRALAZINE HCL 25 MG PO TABS
25.0000 mg | ORAL_TABLET | Freq: Three times a day (TID) | ORAL | Status: DC
  Administered 2020-10-05 – 2020-10-11 (×18): 25 mg via ORAL
  Filled 2020-10-04 (×20): qty 1

## 2020-10-04 MED ORDER — BISACODYL 5 MG PO TBEC
5.0000 mg | DELAYED_RELEASE_TABLET | Freq: Two times a day (BID) | ORAL | Status: AC
  Administered 2020-10-05 (×3): 5 mg via ORAL
  Filled 2020-10-04 (×3): qty 1

## 2020-10-04 MED ORDER — SODIUM CHLORIDE 0.9 % IV BOLUS
1000.0000 mL | Freq: Once | INTRAVENOUS | Status: AC
  Administered 2020-10-04: 1000 mL via INTRAVENOUS

## 2020-10-04 MED ORDER — HEPARIN SODIUM (PORCINE) 5000 UNIT/ML IJ SOLN
5000.0000 [IU] | Freq: Three times a day (TID) | INTRAMUSCULAR | Status: DC
  Administered 2020-10-05 – 2020-10-07 (×7): 5000 [IU] via SUBCUTANEOUS
  Filled 2020-10-04 (×9): qty 1

## 2020-10-04 MED ORDER — BISACODYL 10 MG RE SUPP
10.0000 mg | Freq: Every day | RECTAL | Status: DC
  Administered 2020-10-05 – 2020-10-09 (×2): 10 mg via RECTAL
  Filled 2020-10-04 (×3): qty 1

## 2020-10-04 MED ORDER — FLEET ENEMA 7-19 GM/118ML RE ENEM: 1.0000 | ENEMA | Freq: Once | RECTAL | Status: DC

## 2020-10-04 MED ORDER — AMLODIPINE BESYLATE 5 MG PO TABS
10.0000 mg | ORAL_TABLET | Freq: Every day | ORAL | Status: DC
  Administered 2020-10-05 – 2020-10-10 (×7): 10 mg via ORAL
  Filled 2020-10-04 (×7): qty 2

## 2020-10-04 NOTE — Progress Notes (Signed)
[facility] night shift.  The patient reported to the staff that he is taking hydralazine 25 mg p.o. 3 times daily instead of 37.5 mg p.o. twice daily.  His medication profile and inpatient hydralazine order were updated.  Tennis Must, MD.

## 2020-10-04 NOTE — H&P (Addendum)
[facility] H&P   Patient Demographics:    Luis Freeman, is a 87 y.o. male  MRN: [id number]   DOB - May 01, 1933  Admit Date - 10/04/2020  Outpatient Primary MD for the patient is [name], MD  Referring MD/NP/PA: Dr Sabra Heck  Patient coming from: Home  Chief Complaint  Patient presents with  . Weakness      HPI:    Luis Freeman  is a 87 y.o. male, with medical history significant fordementia, progressive supranuclear palsy, gait abnormality.  History of fall in the past with subdural hematoma, neurogenic bladder, hypertension, B12 deficiency. -Patient was brought to ED by his daughter, due to concerns of difficulty passing urine, and severe constipation, patient is total care currently, he is having 24-hour with her family or paid caregiver, patient with progressive weakness over last few days, has been less interactive as well, with difficulty passing urine, and no good bowel movement for few days, he saw his urologist yesterday, for dark-colored urine, family were told no UTI, no acute findings, per report patient mental status has decreased, he is not as responsive as usual, patient with dementia, cannot provide any history, it was provided by daughter. - in ED patient had sodium of 121, usually it is in the upper 120s, CT abdomen pelvis significant for significant stool burden, and abnormal liver findings suspicious for malignancy versus abscess, [facility] hospitalist consulted to admit.    Review of systems:    With advanced dementia, unable to provide any review of systems   With Past History of the following :    Past Medical History:  Diagnosis Date  . Chronic low back pain   . Dementia ([place])   . Gait abnormality 11/15/2019  . Gait disorder   . Hypertension   . PSP (progressive supranuclear palsy) ([place]) 11/15/2019  . Vitamin B12 deficiency       Past Surgical History:   Procedure Laterality Date  . CATARACT EXTRACTION, BILATERAL    . COLONOSCOPY    . COLONOSCOPY  02/04/2011   Procedure: COLONOSCOPY;  Surgeon: Daneil Dolin, MD;  [facility];  Service: Endoscopy;  Laterality: N/A;      Social History:     Social History   Tobacco Use  . Smoking status: Never Smoker  . Smokeless tobacco: Never Used  Substance Use Topics  . Alcohol use: Not Currently    Alcohol/week: 0.0 standard drinks     Lives -is at home with his wife  Mobility -he has 24-hour home care    Family History :     Family History  Problem Relation Age of Onset  . Diabetes Mother   . Cancer Father   . Cancer - Lung Brother      Home Medications:   Prior to Admission medications   Medication Sig Start Date End Date Taking? Authorizing Provider  acetaminophen (TYLENOL) 325 MG tablet  Take 2 tablets (650 mg total) by mouth every 6 (six) hours as needed for mild pain (or Fever >/= 101). 02/19/20  Yes Emokpae, Courage, MD  amLODipine (NORVASC) 10 MG tablet Take 1 tablet (10 mg total) by mouth daily. For BP 02/20/20  Yes Emokpae, Courage, MD  benazepril (LOTENSIN) 40 MG tablet Take 1 tablet (40 mg total) by mouth in the morning. 02/19/20  Yes Emokpae, Courage, MD  carbidopa-levodopa (SINEMET IR) 25-100 MG tablet Take 1.5 tablets by mouth 3 (three) times daily. 08/19/20  Yes Kathrynn Ducking, MD  cholecalciferol (VITAMIN D) 1000 units tablet Take 1,000 Units by mouth in the morning.    Yes [provider]  cyanocobalamin (,VITAMIN B-12,) 1000 MCG/ML injection Inject 1,000 mcg into the muscle every 30 (thirty) days.  08/02/15  Yes [provider]  hydrALAZINE (APRESOLINE) 25 MG tablet Take 1.5 tablets (37.5 mg total) by mouth 2 (two) times daily. 02/19/20  Yes Emokpae, Courage, MD  memantine (NAMENDA) 10 MG tablet Take 10 mg by mouth daily with lunch. 08/08/19  Yes [provider]  MYRBETRIQ 50 MG TB24 tablet Take 50 mg by mouth daily. 05/06/20  Yes  [provider]  phenazopyridine (PYRIDIUM) 95 MG tablet Take 95 mg by mouth 3 (three) times daily as needed for pain.   Yes [provider]  Polyvinyl Alcohol-Povidone PF 1.4-0.6 % SOLN Apply 1 drop to eye daily as needed (FOR DRY EYE RELIEF).    Yes [provider]  simethicone (MYLICON) 354 MG chewable tablet Chew 125 mg by mouth every 6 (six) hours as needed for flatulence.   Yes [provider]  amLODipine (NORVASC) 5 MG tablet Take 5 mg by mouth daily. Patient not taking: Reported on 10/04/2020 09/19/20   [provider]  Vibegron (GEMTESA) 75 MG TABS Take by mouth. Patient not taking: No sig reported    [provider]     Allergies:    No Known Allergies   Physical Exam:   Vitals  Blood pressure (!) 151/76, pulse 95, temperature 98.3 F (36.8 C), resp. rate 19, height 5' 11.5" (1.816 m), weight 68.5 kg, SpO2 94 %.   1. General pale, elderly male, laying in bed  2.  Is lethargic, opens her eyes to verbal stimuli, minimally verbal    3. No F.N deficits, ALL C.Nerves Intact, all generalized weakness with increased rigidity   4. Ears and Eyes appear Normal, Conjunctivae clear, PERRLA. Moist Oral Mucosa.  5. Supple Neck, No JVD, No cervical lymphadenopathy appriciated, No Carotid Bruits.  Trace edema  6. Symmetrical Chest wall movement, Minister air entry at the bases, no wheezing  7. RRR, No Gallops, Rubs or Murmurs, No Parasternal Heave.  8. Positive Bowel Sounds, abdomen  is mildly distended, nontender, no ascites, he has suprapubic fullness .  9.  No Cyanosis, Normal Skin Turgor, No Skin Rash or Bruise.  10. Good muscle tone,  joints appear normal , no effusions, Normal ROM.  11. No Palpable Lymph Nodes in Neck or Axillae    Data Review:    CBC Recent Labs  Lab 10/04/20 2017  WBC 12.0*  HGB 12.9*  HCT 38.8*  PLT 324  MCV 96.0  MCH 31.9  MCHC 33.2  RDW 12.7  LYMPHSABS 0.4*  MONOABS 1.1*  EOSABS 0.0   BASOSABS 0.1   ------------------------------------------------------------------------------------------------------------------  Chemistries  Recent Labs  Lab 10/04/20 2017  NA 121*  K 4.2  CL 90*  CO2 22  GLUCOSE 104*  BUN 18  CREATININE 0.77  CALCIUM 8.5*  AST 29  ALT 8  ALKPHOS 134*  BILITOT 0.6   ------------------------------------------------------------------------------------------------------------------ estimated creatinine clearance is 65.4 mL/min (by C-G formula based on SCr of 0.77 mg/dL). ------------------------------------------------------------------------------------------------------------------ No results for input(s): TSH, T4TOTAL, T3FREE, THYROIDAB in the last 72 hours.  Invalid input(s): FREET3  Coagulation profile No results for input(s): INR, PROTIME in the last 168 hours. ------------------------------------------------------------------------------------------------------------------- No results for input(s): DDIMER in the last 72 hours. -------------------------------------------------------------------------------------------------------------------  Cardiac Enzymes No results for input(s): CKMB, TROPONINI, MYOGLOBIN in the last 168 hours.  Invalid input(s): CK ------------------------------------------------------------------------------------------------------------------ No results found for: BNP   ---------------------------------------------------------------------------------------------------------------  Urinalysis    Component Value Date/Time   COLORURINE YELLOW 02/27/2020 1255   APPEARANCEUR HAZY (A) 02/27/2020 1255   LABSPEC 1.015 02/27/2020 1255   PHURINE 7.0 02/27/2020 1255   GLUCOSEU NEGATIVE 02/27/2020 1255   HGBUR LARGE (A) 02/27/2020 1255   BILIRUBINUR NEGATIVE 02/27/2020 1255   KETONESUR NEGATIVE 02/27/2020 1255   PROTEINUR 100 (A) 02/27/2020 1255   NITRITE POSITIVE (A) 02/27/2020 1255   LEUKOCYTESUR MODERATE  (A) 02/27/2020 1255    ----------------------------------------------------------------------------------------------------------------   Imaging Results:    DG Abd 1 View  Result Date: 10/04/2020 CLINICAL DATA:  Lack of bowel movement EXAM: ABDOMEN - 1 VIEW COMPARISON:  02/27/2020 FINDINGS: Prominent stool throughout the colon favors constipation. Thoracic and lumbar spondylosis. Likely chronic suspected lumbar compression fractures at L2, L4, and L5. Poor definition of the left hemidiaphragm, cannot exclude left basilar airspace opacity, consider chest radiography for further workup. Questionable blunting of the right lateral costophrenic angle. IMPRESSION: 1. Prominent stool throughout the colon favors constipation. 2. Poor definition of the left hemidiaphragm, cannot exclude left basilar airspace opacity, consider chest radiography for further workup. 3. Possible small right pleural effusion. 4. Likely chronic lumbar compression fractures at L2, L4, and L5. Electronically Signed   By: Van Clines M.D.   On: 10/04/2020 17:48   DG Chest Port 1 View  Result Date: 10/04/2020 CLINICAL DATA:  Weakness EXAM: PORTABLE CHEST 1 VIEW COMPARISON:  10/20/2015 FINDINGS: Bilateral layering pleural effusions. Bilateral lower lobe airspace opacities. Heart is normal size. Aortic atherosclerosis. No acute bony abnormality. IMPRESSION: Bilateral layering effusions with bibasilar atelectasis or infiltrates. Electronically Signed   By: Rolm Baptise M.D.   On: 10/04/2020 19:45    My personal review of EKG: Rhythm NSR, Rate  82 /min, QTc 470   Assessment & Plan:    Active Problems:   Dementia ([place])   Movement disorder   Gait disorder   HTN (hypertension)   Acute on Chronic HypoNatremia    Hyponatremia -Patient with chronic hyponatremia, but this is worse than his baseline, acute on chronic -This appears to be euvolemic to mildly hypervolemic hyponatremia especially with trace edema and bilateral  pleural effusion. -We will check serum osmolality, urine osmolality, BMP, and urine sodium -Received IV NS in ED, will hold, no will keep on fluid restriction 1800 cc/day.  Constipation -CT abdomen pelvis significant stool burden, will start on good bowel regimen.  Urinary difficulty -Check bladder scan -Check UA, reportedly was negative at urologist yesterday -History of overactive bladder, used to be Gemtesa, recently changed back to Myrbetriq  Addendum: Bladder scan showing 600 cc, so will insert Foley catheter, daughter reports significant orthostasis due to Flomax almost resembling stroke, discussed Cardura with her, she would like to review it first, so for now we will not start any medications pending further discussion with her.  Abormal finding in  liver on CT abdomen pelvis -CT abdomen pelvis with an area suspicious for malignancy versus abscess with central necrosis, will consult GI for further evaluation.  Bilateral pleural effusion -Most likely due to volume overload.  Hypertension -Continue with home medications  Progressive supranuclear palsy -Continue Sinemet -Consult PT/OT  Change/unsteady gait/ Restive weakness- We will consult PT -Has 24-hour home care at home  History of dementia -Continue with Namenda -With history of B12 deficiency, will check B12 in a.m.  DVT Prophylaxis Heparin   AM Labs Ordered, also please review Full Orders  Family Communication: Admission, patients condition and plan of care including tests being ordered have been discussed with the patient and daughter at bedside who indicate understanding and agree with the plan and Code Status.  Code Status DNR friend by daughter at bedside  Likely DC to  home  Condition GUARDED    Consults called: none    Admission status:   inpatient  Time spent in minutes : 60 minutes   Phillips Climes M.D on 10/04/2020 at 10:07 PM   [facility] - Office  [phone]

## 2020-10-04 NOTE — Discharge Instructions (Signed)
X-rays concerning for pleural effusion and possible left basilar airspace opacity.  Unable to perform chest x-ray in office given patient limited mobility.  Recommending further evaluation and management in the ED.

## 2020-10-04 NOTE — ED Triage Notes (Signed)
Pt from home with daughter, daughter states that he hasn't had a BM since Sunday and decreased urinary output. [name] daughter states pt has also been much weaker than normal. Pt has not been able to lay down flat for a few days.  Pt was seen at [facility] today and was sent over here due to fluid being on his lungs on his xray.

## 2020-10-04 NOTE — ED Triage Notes (Signed)
Pt presents with family members with abdominal distension and no BM sense Sunday. Takes miralax 3 days a week .

## 2020-10-04 NOTE — ED Notes (Signed)
Patient has male wick in place at this time.

## 2020-10-04 NOTE — H&P (Incomplete)
[facility] H&P   Patient Demographics:    Luis Freeman, is a 87 y.o. male  MRN: [id number]   DOB - 1933-06-13  Admit Date - 10/04/2020  Outpatient Primary MD for the patient is [name], MD  Referring MD/NP/PA: Dr Sabra Heck  Patient coming from: Home  Chief Complaint  Patient presents with  . Weakness      HPI:    Luis Freeman  is a 87 y.o. male, with medical history significant fordementia, progressive supranuclear palsy, gait abnormality.    Review of systems:    In addition to the HPI above, **** No Fever-chills, No Headache, No changes with Vision or hearing, No problems swallowing food or Liquids, No Chest pain, Cough or Shortness of Breath, No Abdominal pain, No Nausea or Vommitting, Bowel movements are regular, No Blood in stool or Urine, No dysuria, No new skin rashes or bruises, No new joints pains-aches,  No new weakness, tingling, numbness in any extremity, No recent weight gain or loss, No polyuria, polydypsia or polyphagia, No significant Mental Stressors.  A full 10 point Review of Systems was done, except as stated above, all other Review of Systems were negative.   With Past History of the following :    Past Medical History:  Diagnosis Date  . Chronic low back pain   . Dementia ([place])   . Gait abnormality 11/15/2019  . Gait disorder   . Hypertension   . PSP (progressive supranuclear palsy) ([place]) 11/15/2019  . Vitamin B12 deficiency       Past Surgical History:  Procedure Laterality Date  . CATARACT EXTRACTION, BILATERAL    . COLONOSCOPY    . COLONOSCOPY  02/04/2011   Procedure: COLONOSCOPY;  Surgeon: Daneil Dolin, MD;  [facility];  Service: Endoscopy;  Laterality: N/A;      Social History:     Social History   Tobacco Use  . Smoking status: Never Smoker  . Smokeless tobacco: Never Used  Substance Use Topics  . Alcohol  use: Not Currently    Alcohol/week: 0.0 standard drinks     Lives -   Mobility -   ********   Family History :     Family History  Problem Relation Age of Onset  . Diabetes Mother   . Cancer Father   . Cancer - Lung Brother    ********   Home Medications:   Prior to Admission medications   Medication Sig Start Date End Date Taking? Authorizing Provider  acetaminophen (TYLENOL) 325 MG tablet Take 2 tablets (650 mg total) by mouth every 6 (six) hours as needed for mild pain (or Fever >/= 101). 02/19/20  Yes Emokpae, Courage, MD  amLODipine (NORVASC) 10 MG tablet Take 1 tablet (10 mg total) by mouth daily. For BP 02/20/20  Yes Emokpae, Courage, MD  benazepril (LOTENSIN) 40 MG tablet Take 1 tablet (40 mg total) by  mouth in the morning. 02/19/20  Yes Emokpae, Courage, MD  carbidopa-levodopa (SINEMET IR) 25-100 MG tablet Take 1.5 tablets by mouth 3 (three) times daily. 08/19/20  Yes Kathrynn Ducking, MD  cholecalciferol (VITAMIN D) 1000 units tablet Take 1,000 Units by mouth in the morning.    Yes [provider]  cyanocobalamin (,VITAMIN B-12,) 1000 MCG/ML injection Inject 1,000 mcg into the muscle every 30 (thirty) days.  08/02/15  Yes [provider]  hydrALAZINE (APRESOLINE) 25 MG tablet Take 1.5 tablets (37.5 mg total) by mouth 2 (two) times daily. 02/19/20  Yes Emokpae, Courage, MD  memantine (NAMENDA) 10 MG tablet Take 10 mg by mouth daily with lunch. 08/08/19  Yes [provider]  MYRBETRIQ 50 MG TB24 tablet Take 50 mg by mouth daily. 05/06/20  Yes [provider]  phenazopyridine (PYRIDIUM) 95 MG tablet Take 95 mg by mouth 3 (three) times daily as needed for pain.   Yes [provider]  Polyvinyl Alcohol-Povidone PF 1.4-0.6 % SOLN Apply 1 drop to eye daily as needed (FOR DRY EYE RELIEF).    Yes [provider]  simethicone (MYLICON) 193 MG chewable tablet Chew 125 mg by mouth every 6 (six) hours as needed for flatulence.   Yes  [provider]  amLODipine (NORVASC) 5 MG tablet Take 5 mg by mouth daily. Patient not taking: Reported on 10/04/2020 09/19/20   [provider]  Vibegron (GEMTESA) 75 MG TABS Take by mouth. Patient not taking: No sig reported    [provider]     Allergies:    No Known Allergies   Physical Exam:   Vitals  Blood pressure (!) 151/76, pulse 95, temperature 98.3 F (36.8 C), resp. rate 19, height 5' 11.5" (1.816 m), weight 68.5 kg, SpO2 94 %.   1. General ******* lying in bed in NAD,  ***********  2. Normal affect and insight, Not Suicidal or Homicidal, Awake Alert, Oriented X 3.  3. No F.N deficits, ALL C.Nerves Intact, Strength 5/5 all 4 extremities, Sensation intact all 4 extremities, Plantars down going.  4. Ears and Eyes appear Normal, Conjunctivae clear, PERRLA. Moist Oral Mucosa.  5. Supple Neck, No JVD, No cervical lymphadenopathy appriciated, No Carotid Bruits.  6. Symmetrical Chest wall movement, Good air movement bilaterally, CTAB.  7. RRR, No Gallops, Rubs or Murmurs, No Parasternal Heave.  8. Positive Bowel Sounds, Abdomen Soft, No tenderness, No organomegaly appriciated,No rebound -guarding or rigidity.  9.  No Cyanosis, Normal Skin Turgor, No Skin Rash or Bruise.  10. Good muscle tone,  joints appear normal , no effusions, Normal ROM.  11. No Palpable Lymph Nodes in Neck or Axillae  **************   Data Review:    CBC Recent Labs  Lab 10/04/20 2017  WBC 12.0*  HGB 12.9*  HCT 38.8*  PLT 324  MCV 96.0  MCH 31.9  MCHC 33.2  RDW 12.7  LYMPHSABS 0.4*  MONOABS 1.1*  EOSABS 0.0  BASOSABS 0.1   ------------------------------------------------------------------------------------------------------------------  Chemistries  Recent Labs  Lab 10/04/20 2017  NA 121*  K 4.2  CL 90*  CO2 22  GLUCOSE 104*  BUN 18  CREATININE 0.77  CALCIUM 8.5*  AST 29  ALT 8  ALKPHOS 134*  BILITOT 0.6    ------------------------------------------------------------------------------------------------------------------ estimated creatinine clearance is 65.4 mL/min (by C-G formula based on SCr of 0.77 mg/dL). ------------------------------------------------------------------------------------------------------------------ No results for input(s): TSH, T4TOTAL, T3FREE, THYROIDAB in the last 72 hours.  Invalid input(s): FREET3  Coagulation profile No results for  input(s): INR, PROTIME in the last 168 hours. ------------------------------------------------------------------------------------------------------------------- No results for input(s): DDIMER in the last 72 hours. -------------------------------------------------------------------------------------------------------------------  Cardiac Enzymes No results for input(s): CKMB, TROPONINI, MYOGLOBIN in the last 168 hours.  Invalid input(s): CK ------------------------------------------------------------------------------------------------------------------ No results found for: BNP   ---------------------------------------------------------------------------------------------------------------  Urinalysis    Component Value Date/Time   COLORURINE YELLOW 02/27/2020 1255   APPEARANCEUR HAZY (A) 02/27/2020 1255   LABSPEC 1.015 02/27/2020 1255   PHURINE 7.0 02/27/2020 1255   GLUCOSEU NEGATIVE 02/27/2020 1255   HGBUR LARGE (A) 02/27/2020 1255   BILIRUBINUR NEGATIVE 02/27/2020 1255   KETONESUR NEGATIVE 02/27/2020 1255   PROTEINUR 100 (A) 02/27/2020 1255   NITRITE POSITIVE (A) 02/27/2020 1255   LEUKOCYTESUR MODERATE (A) 02/27/2020 1255    ----------------------------------------------------------------------------------------------------------------   Imaging Results:    DG Abd 1 View  Result Date: 10/04/2020 CLINICAL DATA:  Lack of bowel movement EXAM: ABDOMEN - 1 VIEW COMPARISON:  02/27/2020 FINDINGS: Prominent stool  throughout the colon favors constipation. Thoracic and lumbar spondylosis. Likely chronic suspected lumbar compression fractures at L2, L4, and L5. Poor definition of the left hemidiaphragm, cannot exclude left basilar airspace opacity, consider chest radiography for further workup. Questionable blunting of the right lateral costophrenic angle. IMPRESSION: 1. Prominent stool throughout the colon favors constipation. 2. Poor definition of the left hemidiaphragm, cannot exclude left basilar airspace opacity, consider chest radiography for further workup. 3. Possible small right pleural effusion. 4. Likely chronic lumbar compression fractures at L2, L4, and L5. Electronically Signed   By: Van Clines M.D.   On: 10/04/2020 17:48   DG Chest Port 1 View  Result Date: 10/04/2020 CLINICAL DATA:  Weakness EXAM: PORTABLE CHEST 1 VIEW COMPARISON:  10/20/2015 FINDINGS: Bilateral layering pleural effusions. Bilateral lower lobe airspace opacities. Heart is normal size. Aortic atherosclerosis. No acute bony abnormality. IMPRESSION: Bilateral layering effusions with bibasilar atelectasis or infiltrates. Electronically Signed   By: Rolm Baptise M.D.   On: 10/04/2020 19:45    My personal review of EKG: Rhythm NSR, Rate  ** /min, QTc *** , no Acute ST changes   Assessment & Plan:    Active Problems:   Dementia ([place])   Movement disorder   Gait disorder   HTN (hypertension)   Acute on Chronic HypoNatremia    Hyponatremia -     DVT Prophylaxis Heparin -  Lovenox - SCDs ***  AM Labs Ordered, also please review Full Orders  Family Communication: Admission, patients condition and plan of care including tests being ordered have been discussed with the patient and **** who indicate understanding and agree with the plan and Code Status.  Code Status ***  Likely DC to  ***  Condition GUARDED  *******  Consults called: ***    Admission status: ***    Time spent in minutes : Phillips Climes M.D on 10/04/2020 at 9:33 PM   [facility] - Office  [phone]

## 2020-10-04 NOTE — ED Provider Notes (Signed)
[facility] EMERGENCY DEPARTMENT Provider Note   CSN: [id number] Arrival date & time: 10/04/20  1851     History Chief Complaint  Patient presents with  . Weakness    Luis Freeman is a 87 y.o. male.  HPI   This patient is an 87 year old male, he is accompanied by his daughter who is the primary historian is the patient has progressive supranuclear palsy, history of hypertension, there is some dementia.  He is usually ambulatory but with assistance at home, he has 24-hour around-the-clock care with either family or paid caregivers and it was noticed over the last several days that he has had progressive abdominal distention as he has had a lack of a bowel movement in approximately 4 days, prior to that they were very small, he has had darkened urine and a urine sample that was brought to the urologist yesterday revealed that he had no UTI just concentrated urine.  He has been drinking fluids, he has been eating but not as much but his mental status has been decreased, he is not as responsive as usual, in the past he has had this with urinary tract infections, he is unable to give any information thus a level 5 caveat applies.  Caregiver reports no fevers, no diarrhea, no swelling of the legs, he went to the urgent care prior to coming here because of the abdominal distention and a KUB showed stool burden, possible small right pleural effusion and he was redirected to the emergency department  Past Medical History:  Diagnosis Date  . Chronic low back pain   . Dementia ([place])   . Gait abnormality 11/15/2019  . Gait disorder   . Hypertension   . PSP (progressive supranuclear palsy) ([place]) 11/15/2019  . Vitamin B12 deficiency     Patient Active Problem List   Diagnosis Date Noted  . Nocturia more than twice per night 09/12/2020  . At high risk for injury related to fall 09/12/2020  . Dysarthria and anarthria 09/12/2020  . Acute metabolic encephalopathy 98/92/1194  . Acute on Chronic  HypoNatremia 02/21/2020  . HTN (hypertension) 02/16/2020  . SDH (subdural hematoma) ([place]) 02/15/2020  . PSP (progressive supranuclear palsy) ([place]) 11/15/2019  . Gait disorder 11/15/2019  . Movement disorder 05/18/2019  . Dementia ([place]) 11/10/2018    Past Surgical History:  Procedure Laterality Date  . CATARACT EXTRACTION, BILATERAL    . COLONOSCOPY    . COLONOSCOPY  02/04/2011   Procedure: COLONOSCOPY;  Surgeon: Daneil Dolin, MD;  Location: [facility];  Service: Endoscopy;  Laterality: N/A;       Family History  Problem Relation Age of Onset  . Diabetes Mother   . Cancer Father   . Cancer - Lung Brother     Social History   Tobacco Use  . Smoking status: Never Smoker  . Smokeless tobacco: Never Used  Vaping Use  . Vaping Use: Never used  Substance Use Topics  . Alcohol use: Not Currently    Alcohol/week: 0.0 standard drinks  . Drug use: No    Home Medications Prior to Admission medications   Medication Sig Start Date End Date Taking? Authorizing Provider  acetaminophen (TYLENOL) 325 MG tablet Take 2 tablets (650 mg total) by mouth every 6 (six) hours as needed for mild pain (or Fever >/= 101). 02/19/20  Yes Emokpae, Courage, MD  amLODipine (NORVASC) 10 MG tablet Take 1 tablet (10 mg total) by mouth daily. For BP 02/20/20  Yes Roxan Hockey, MD  benazepril (LOTENSIN) 40 MG tablet Take 1 tablet (40 mg total) by mouth in the morning. 02/19/20  Yes Emokpae, Courage, MD  carbidopa-levodopa (SINEMET IR) 25-100 MG tablet Take 1.5 tablets by mouth 3 (three) times daily. 08/19/20  Yes Kathrynn Ducking, MD  cholecalciferol (VITAMIN D) 1000 units tablet Take 1,000 Units by mouth in the morning.    Yes [provider]  cyanocobalamin (,VITAMIN B-12,) 1000 MCG/ML injection Inject 1,000 mcg into the muscle every 30 (thirty) days.  08/02/15  Yes [provider]  hydrALAZINE (APRESOLINE) 25 MG tablet Take 1.5 tablets (37.5 mg total) by mouth 2 (two) times daily.  02/19/20  Yes Emokpae, Courage, MD  memantine (NAMENDA) 10 MG tablet Take 10 mg by mouth daily with lunch. 08/08/19  Yes [provider]  MYRBETRIQ 50 MG TB24 tablet Take 50 mg by mouth daily. 05/06/20  Yes [provider]  phenazopyridine (PYRIDIUM) 95 MG tablet Take 95 mg by mouth 3 (three) times daily as needed for pain.   Yes [provider]  Polyvinyl Alcohol-Povidone PF 1.4-0.6 % SOLN Apply 1 drop to eye daily as needed (FOR DRY EYE RELIEF).    Yes [provider]  simethicone (MYLICON) 474 MG chewable tablet Chew 125 mg by mouth every 6 (six) hours as needed for flatulence.   Yes [provider]  amLODipine (NORVASC) 5 MG tablet Take 5 mg by mouth daily. Patient not taking: Reported on 10/04/2020 09/19/20   [provider]  Vibegron (GEMTESA) 75 MG TABS Take by mouth. Patient not taking: No sig reported    [provider]    Allergies    Patient has no known allergies.  Review of Systems   Review of Systems  Unable to perform ROS: Mental status change    Physical Exam Updated Vital Signs BP (!) 151/76   Pulse 95   Temp 98.3 F (36.8 C)   Resp 19   Ht 1.816 m (5' 11.5")   Wt 68.5 kg   SpO2 94%   BMI 20.77 kg/m   Physical Exam Vitals and nursing note reviewed.  Constitutional:      General: He is not in acute distress.    Appearance: He is well-developed.  HENT:     Head: Normocephalic and atraumatic.     Mouth/Throat:     Pharynx: No oropharyngeal exudate.  Eyes:     General: No scleral icterus.       Right eye: No discharge.        Left eye: No discharge.     Conjunctiva/sclera: Conjunctivae normal.     Pupils: Pupils are equal, round, and reactive to light.  Neck:     Thyroid: No thyromegaly.     Vascular: No JVD.  Cardiovascular:     Rate and Rhythm: Normal rate and regular rhythm.     Heart sounds: Normal heart sounds. No murmur heard. No friction rub. No gallop.   Pulmonary:     Effort:  Pulmonary effort is normal. No respiratory distress.     Breath sounds: Normal breath sounds. No wheezing or rales.  Abdominal:     General: Bowel sounds are normal. There is distension.     Palpations: Abdomen is soft. There is no mass.     Tenderness: There is no abdominal tenderness.     Comments: No focal abdominal tenderness or masses but there is distention with diffuse tympanic sounds to percussion  Musculoskeletal:        General: No  tenderness. Normal range of motion.     Cervical back: Normal range of motion and neck supple.     Right lower leg: No edema.     Left lower leg: No edema.  Lymphadenopathy:     Cervical: No cervical adenopathy.  Skin:    General: Skin is warm and dry.     Findings: No erythema or rash.  Neurological:     Mental Status: He is alert.     Coordination: Coordination normal.     Comments: The patient is unable to speak other than mumbling and trying to repeat what is said, the daughter states this is baseline but he is speaking slower.  He seems to move all 4 extremities but extremely slow and rigid, this is also baseline.  He appears generally diffusely weak.  Psychiatric:        Behavior: Behavior normal.     ED Results / Procedures / Treatments   Labs (all labs ordered are listed, but only abnormal results are displayed) Labs Reviewed  CBC WITH DIFFERENTIAL/PLATELET - Abnormal; Notable for the following components:      Result Value   WBC 12.0 (*)    RBC 4.04 (*)    Hemoglobin 12.9 (*)    HCT 38.8 (*)    Neutro Abs 10.3 (*)    Lymphs Abs 0.4 (*)    Monocytes Absolute 1.1 (*)    Abs Immature Granulocytes 0.15 (*)    All other components within normal limits  COMPREHENSIVE METABOLIC PANEL - Abnormal; Notable for the following components:   Sodium 121 (*)    Chloride 90 (*)    Glucose, Bld 104 (*)    Calcium 8.5 (*)    Alkaline Phosphatase 134 (*)    All other components within normal limits  CK - Abnormal; Notable for the following  components:   Total CK 46 (*)    All other components within normal limits  URINE CULTURE  LACTIC ACID, PLASMA  URINALYSIS, ROUTINE W REFLEX MICROSCOPIC  SODIUM, URINE, RANDOM  OSMOLALITY, URINE  OSMOLALITY    EKG None  Radiology DG Abd 1 View  Result Date: 10/04/2020 CLINICAL DATA:  Lack of bowel movement EXAM: ABDOMEN - 1 VIEW COMPARISON:  02/27/2020 FINDINGS: Prominent stool throughout the colon favors constipation. Thoracic and lumbar spondylosis. Likely chronic suspected lumbar compression fractures at L2, L4, and L5. Poor definition of the left hemidiaphragm, cannot exclude left basilar airspace opacity, consider chest radiography for further workup. Questionable blunting of the right lateral costophrenic angle. IMPRESSION: 1. Prominent stool throughout the colon favors constipation. 2. Poor definition of the left hemidiaphragm, cannot exclude left basilar airspace opacity, consider chest radiography for further workup. 3. Possible small right pleural effusion. 4. Likely chronic lumbar compression fractures at L2, L4, and L5. Electronically Signed   By: Van Clines M.D.   On: 10/04/2020 17:48   DG Chest Port 1 View  Result Date: 10/04/2020 CLINICAL DATA:  Weakness EXAM: PORTABLE CHEST 1 VIEW COMPARISON:  10/20/2015 FINDINGS: Bilateral layering pleural effusions. Bilateral lower lobe airspace opacities. Heart is normal size. Aortic atherosclerosis. No acute bony abnormality. IMPRESSION: Bilateral layering effusions with bibasilar atelectasis or infiltrates. Electronically Signed   By: Rolm Baptise M.D.   On: 10/04/2020 19:45    Procedures .Critical Care Performed by: Noemi Chapel, MD Authorized by: Noemi Chapel, MD   Critical care provider statement:    Critical care time (minutes):  35   Critical care time was exclusive of:  Separately billable  procedures and treating other patients and teaching time   Critical care was necessary to treat or prevent imminent or  life-threatening deterioration of the following conditions:  Endocrine crisis   Critical care was time spent personally by me on the following activities:  Blood draw for specimens, development of treatment plan with patient or surrogate, discussions with consultants, evaluation of patient's response to treatment, examination of patient, obtaining history from patient or surrogate, ordering and performing treatments and interventions, ordering and review of laboratory studies, ordering and review of radiographic studies, pulse oximetry, re-evaluation of patient's condition and review of old charts     Medications Ordered in ED Medications  iohexol (OMNIPAQUE) 300 MG/ML solution 100 mL (has no administration in time range)  sodium chloride 0.9 % bolus 1,000 mL (0 mLs Intravenous Stopped 10/04/20 2059)    ED Course  I have reviewed the triage vital signs and the nursing notes.  Pertinent labs & imaging results that were available during my care of the patient were reviewed by me and considered in my medical decision making (see chart for details).    MDM Rules/Calculators/A&P                          The patient has had altered mental status of unclear etiology, he could very well be constipated but this could also be bowel obstruction, family reports that he does appear to be passing some gas but not stools.  At this time will obtain CT scan of the brain without contrast, CT scan of the abdomen and pelvis with contrast, IV fluids and check metabolic panel for signs of dehydration or rhabdomyolysis.  Labs show that the patient has a slight leukocytosis, no significant anemia, he has severe hyponatremia at 120 which is likely the cause of his generalized weakness.  CT scan of the abdomen and pelvis is pending, vital signs reflect hypertension, he is not hypotensive or tachycardic.  I discussed his care with the hospitalist to admit the patient to the hospital for this progressive  hyponatremia  Final Clinical Impression(s) / ED Diagnoses Final diagnoses:  Hyponatremia  Constipation, unspecified constipation type      Noemi Chapel, MD 10/04/20 2123

## 2020-10-04 NOTE — ED Provider Notes (Signed)
[facility]   [id number] 10/04/20 Arrival Time: 0867  CC: Constipation  SUBJECTIVE: HPI obtained from family Luis Freeman is a 87 y.o. male who presents with complaint of constipation and abdominal distention x 2-3 days.  Report decreased activity.  Patient denies pain.  Has tried miralax without relief. Denies similar symptoms in the past.  Last BM 3 days. Reports decreased urine output and change in color.    Reports fall in bathroom on 08/29/20  Urine without UTI per urologist.    Denies fever, chills, appetite changes, weight changes, nausea, vomiting, chest pain, SOB, diarrhea, hematochezia, melena, dysuria, difficulty urinating, increased frequency or urgency, flank pain, loss of bowel or bladder function.    No LMP for male patient.  ROS: As per HPI.  All other pertinent ROS negative.     Past Medical History:  Diagnosis Date  . Chronic low back pain   . Dementia ([place])   . Gait abnormality 11/15/2019  . Gait disorder   . Hypertension   . PSP (progressive supranuclear palsy) ([place]) 11/15/2019  . Vitamin B12 deficiency    Past Surgical History:  Procedure Laterality Date  . CATARACT EXTRACTION, BILATERAL    . COLONOSCOPY    . COLONOSCOPY  02/04/2011   Procedure: COLONOSCOPY;  Surgeon: Daneil Dolin, MD;  Location: [facility];  Service: Endoscopy;  Laterality: N/A;   No Known Allergies No current facility-administered medications on file prior to encounter.   Current Outpatient Medications on File Prior to Encounter  Medication Sig Dispense Refill  . acetaminophen (TYLENOL) 325 MG tablet Take 2 tablets (650 mg total) by mouth every 6 (six) hours as needed for mild pain (or Fever >/= 101). 12 tablet 0  . amLODipine (NORVASC) 10 MG tablet Take 1 tablet (10 mg total) by mouth daily. For BP 30 tablet 3  . benazepril (LOTENSIN) 40 MG tablet Take 1 tablet (40 mg total) by mouth in the morning. 30 tablet 3  . carbidopa-levodopa (SINEMET IR) 25-100 MG tablet Take  1.5 tablets by mouth 3 (three) times daily. 405 tablet 1  . cholecalciferol (VITAMIN D) 1000 units tablet Take 1,000 Units by mouth in the morning.     . cyanocobalamin (,VITAMIN B-12,) 1000 MCG/ML injection Inject 1,000 mcg into the muscle every 30 (thirty) days.   1  . hydrALAZINE (APRESOLINE) 25 MG tablet Take 1.5 tablets (37.5 mg total) by mouth 2 (two) times daily. 90 tablet 3  . memantine (NAMENDA) 10 MG tablet Take 10 mg by mouth daily with lunch.    Marland Kitchen MYRBETRIQ 50 MG TB24 tablet Take 50 mg by mouth daily.    . Polyvinyl Alcohol-Povidone PF 1.4-0.6 % SOLN Apply 1 drop to eye daily as needed (FOR DRY EYE RELIEF).     . Vibegron (GEMTESA) 75 MG TABS Take by mouth.     Social History   Socioeconomic History  . Marital status: Married    Spouse name: Not on file  . Number of children: Not on file  . Years of education: Not on file  . Highest education level: Not on file  Occupational History  . Not on file  Tobacco Use  . Smoking status: Never Smoker  . Smokeless tobacco: Never Used  Vaping Use  . Vaping Use: Never used  Substance and Sexual Activity  . Alcohol use: Not Currently    Alcohol/week: 0.0 standard drinks  . Drug use: No  . Sexual activity: Not on file  Other Topics Concern  .  Not on file  Social History Narrative   Lives with wife and caregivers 24/7   Right handed   Drinks 1-2 cups caffeine daily   Social Determinants of Health   Financial Resource Strain: Not on file  Food Insecurity: Not on file  Transportation Needs: Not on file  Physical Activity: Not on file  Stress: Not on file  Social Connections: Not on file  Intimate Partner Violence: Not on file   Family History  Problem Relation Age of Onset  . Diabetes Mother   . Cancer Father   . Cancer - Lung Brother      OBJECTIVE:  Vitals:   10/04/20 1840  BP: 121/85  Pulse: 87  Resp: 20  Temp: (!) 97.5 F (36.4 C)  SpO2: 93%    General appearance: nonverbal; sitting in wheelchair;  staring off HEENT: NCAT.   Lungs: clear to auscultation bilaterally without adventitious breath sounds Heart: regular rate and rhythm.   Abdomen: soft, distended; decreased active bowel sounds; non-tender to light and deep palpation Back: no CVA tenderness; mild erythema over mid back over vertebral processes; small 0.25 cm ulcer to superior gluteal cleft Skin: warm and dry Neurologic: sitting in wheelchair Psychological: nonverbal  DIAGNOSTIC STUDIES:  DG Abd 1 View  Result Date: 10/04/2020 CLINICAL DATA:  Lack of bowel movement EXAM: ABDOMEN - 1 VIEW COMPARISON:  02/27/2020 FINDINGS: Prominent stool throughout the colon favors constipation. Thoracic and lumbar spondylosis. Likely chronic suspected lumbar compression fractures at L2, L4, and L5. Poor definition of the left hemidiaphragm, cannot exclude left basilar airspace opacity, consider chest radiography for further workup. Questionable blunting of the right lateral costophrenic angle. IMPRESSION: 1. Prominent stool throughout the colon favors constipation. 2. Poor definition of the left hemidiaphragm, cannot exclude left basilar airspace opacity, consider chest radiography for further workup. 3. Possible small right pleural effusion. 4. Likely chronic lumbar compression fractures at L2, L4, and L5. Electronically Signed   By: Van Clines M.D.   On: 10/04/2020 17:48    I have reviewed the x-rays myself and the radiologist interpretation. I am in agreement with the radiologist interpretation.     ASSESSMENT & PLAN:  1. Constipation, unspecified constipation type   2. Abdominal distention   3. Abnormal x-ray of abdomen     X-rays concerning for pleural effusion and possible left basilar airspace opacity.  Unable to perform chest x-ray in office given patient limited mobility.  Recommending further evaluation and management in the ED.    Discussed ulcer management with frequent position changes and applying neosporin.  Encouraged  family to follow up with PCP for recheck.     Lestine Box, PA-C 10/04/20 3734

## 2020-10-05 DIAGNOSIS — R933 Abnormal findings on diagnostic imaging of other parts of digestive tract: Secondary | ICD-10-CM

## 2020-10-05 DIAGNOSIS — I10 Essential (primary) hypertension: Secondary | ICD-10-CM | POA: Diagnosis not present

## 2020-10-05 DIAGNOSIS — E871 Hypo-osmolality and hyponatremia: Secondary | ICD-10-CM | POA: Diagnosis not present

## 2020-10-05 DIAGNOSIS — K5900 Constipation, unspecified: Secondary | ICD-10-CM | POA: Diagnosis not present

## 2020-10-05 DIAGNOSIS — G301 Alzheimer's disease with late onset: Secondary | ICD-10-CM | POA: Diagnosis not present

## 2020-10-05 DIAGNOSIS — R269 Unspecified abnormalities of gait and mobility: Secondary | ICD-10-CM | POA: Diagnosis not present

## 2020-10-05 LAB — COMPREHENSIVE METABOLIC PANEL
ALT: 7 U/L (ref 0–44)
AST: 21 U/L (ref 15–41)
Albumin: 2.9 g/dL — ABNORMAL LOW (ref 3.5–5.0)
Alkaline Phosphatase: 113 U/L (ref 38–126)
Anion gap: 9 (ref 5–15)
BUN: 14 mg/dL (ref 8–23)
CO2: 20 mmol/L — ABNORMAL LOW (ref 22–32)
Calcium: 8.4 mg/dL — ABNORMAL LOW (ref 8.9–10.3)
Chloride: 96 mmol/L — ABNORMAL LOW (ref 98–111)
Creatinine, Ser: 0.61 mg/dL (ref 0.61–1.24)
GFR, Estimated: >60 mL/min (ref >60)
Glucose, Bld: 93 mg/dL (ref 70–99)
Potassium: 3.7 mmol/L (ref 3.5–5.1)
Sodium: 125 mmol/L — ABNORMAL LOW (ref 135–145)
Total Bilirubin: 0.6 mg/dL (ref 0.3–1.2)
Total Protein: 6 g/dL — ABNORMAL LOW (ref 6.5–8.1)

## 2020-10-05 LAB — CBC
HCT: 35.6 % — ABNORMAL LOW (ref 39.0–52.0)
Hemoglobin: 11.6 g/dL — ABNORMAL LOW (ref 13.0–17.0)
MCH: 31.6 pg (ref 26.0–34.0)
MCHC: 32.6 g/dL (ref 30.0–36.0)
MCV: 97 fL (ref 80.0–100.0)
Platelets: 293 10*3/uL (ref 150–400)
RBC: 3.67 MIL/uL — ABNORMAL LOW (ref 4.22–5.81)
RDW: 12.6 % (ref 11.5–15.5)
WBC: 11 10*3/uL — ABNORMAL HIGH (ref 4.0–10.5)
nRBC: 0 % (ref 0.0–0.2)

## 2020-10-05 LAB — OSMOLALITY: Osmolality: 262 mOsm/kg — ABNORMAL LOW (ref 275–295)

## 2020-10-05 LAB — OSMOLALITY, URINE: Osmolality, Ur: 209 mOsm/kg — ABNORMAL LOW (ref 300–900)

## 2020-10-05 LAB — VITAMIN B12: Vitamin B-12: 4163 pg/mL — ABNORMAL HIGH (ref 180–914)

## 2020-10-05 LAB — SARS CORONAVIRUS 2 (TAT 6-24 HRS): SARS Coronavirus 2: NEGATIVE

## 2020-10-05 MED ORDER — PEG 3350-KCL-NA BICARB-NACL 420 G PO SOLR
4000.0000 mL | Freq: Once | ORAL | Status: AC
  Administered 2020-10-05: 4000 mL via ORAL

## 2020-10-05 MED ORDER — PEG 3350-KCL-NA BICARB-NACL 420 G PO SOLR: 4000.0000 mL | Freq: Once | ORAL | Status: DC

## 2020-10-05 MED ORDER — ONDANSETRON HCL 4 MG/2ML IJ SOLN
4.0000 mg | Freq: Four times a day (QID) | INTRAMUSCULAR | Status: DC | PRN
  Administered 2020-10-05 – 2020-10-06 (×2): 4 mg via INTRAVENOUS
  Filled 2020-10-05 (×3): qty 2

## 2020-10-05 NOTE — Consult Note (Signed)
Referring Provider: No ref. provider found Primary Care Physician:  [name], MD Primary Gastroenterologist:  Dr. Gala Romney  Reason for Consultation: Liver mass; constipation  HPI: 87 year old gentleman with dementia, progressive supranuclear palsy associated gait abnormality admitted to the hospital with weakness, worsening constipation and acute on chronic hyponatremia.   Abdomen was found be somewhat distended on presentation.  CT of the abdomen demonstrated marked stool in the more proximal colon with relative paucity in the sigmoid and rectum.  Large irregular mass in the liver suspicious for either necrotic tumor or abscess.  It is notable his LFTs are normal.  No fever or significant white count. Patient has had significant decline in mental status over the past 1 year.  He requires 24 7 care at home.  History of subdural hematoma.   Takes as needed MiraLAX and fiber supplements at home.  May go 2 to 3 days without a bowel movement does not have any rectal bleeding. Was found to have urinary retention this admission and now has a Foley.  Negative screening colonoscopy 10 years ago. Patient has not had any nausea or vomiting.  His appetite has been described as being very good.  He is on a regular diet now.  Moreover, his daughter, Luis Freeman, tells me patient had a speech swallowing evaluation within the last 6 months and patient was found to have no significant abnormalities.  Past Medical History:  Diagnosis Date  . Chronic low back pain   . Dementia ([place])   . Gait abnormality 11/15/2019  . Gait disorder   . Hypertension   . PSP (progressive supranuclear palsy) ([place]) 11/15/2019  . Vitamin B12 deficiency     Past Surgical History:  Procedure Laterality Date  . CATARACT EXTRACTION, BILATERAL    . COLONOSCOPY    . COLONOSCOPY  02/04/2011   Procedure: COLONOSCOPY;  Surgeon: Daneil Dolin, MD;  Location: [facility];  Service: Endoscopy;  Laterality: N/A;    Prior to  Admission medications   Medication Sig Start Date End Date Taking? Authorizing Provider  acetaminophen (TYLENOL) 325 MG tablet Take 2 tablets (650 mg total) by mouth every 6 (six) hours as needed for mild pain (or Fever >/= 101). 02/19/20  Yes Emokpae, Courage, MD  amLODipine (NORVASC) 10 MG tablet Take 1 tablet (10 mg total) by mouth daily. For BP 02/20/20  Yes Emokpae, Courage, MD  benazepril (LOTENSIN) 40 MG tablet Take 1 tablet (40 mg total) by mouth in the morning. 02/19/20  Yes Emokpae, Courage, MD  carbidopa-levodopa (SINEMET IR) 25-100 MG tablet Take 1.5 tablets by mouth 3 (three) times daily. 08/19/20  Yes Kathrynn Ducking, MD  cholecalciferol (VITAMIN D) 1000 units tablet Take 1,000 Units by mouth in the morning.    Yes [provider]  cyanocobalamin (,VITAMIN B-12,) 1000 MCG/ML injection Inject 1,000 mcg into the muscle every 30 (thirty) days.  08/02/15  Yes [provider]  hydrALAZINE (APRESOLINE) 25 MG tablet Take 1.5 tablets (37.5 mg total) by mouth 2 (two) times daily. 02/19/20  Yes Emokpae, Courage, MD  memantine (NAMENDA) 10 MG tablet Take 10 mg by mouth daily with lunch. 08/08/19  Yes [provider]  MYRBETRIQ 50 MG TB24 tablet Take 50 mg by mouth daily. 05/06/20  Yes [provider]  phenazopyridine (PYRIDIUM) 95 MG tablet Take 95 mg by mouth 3 (three) times daily as needed for pain.   Yes [provider]  Polyvinyl Alcohol-Povidone PF 1.4-0.6 % SOLN Apply 1 drop to  eye daily as needed (FOR DRY EYE RELIEF).    Yes [provider]  simethicone (MYLICON) 834 MG chewable tablet Chew 125 mg by mouth every 6 (six) hours as needed for flatulence.   Yes [provider]  amLODipine (NORVASC) 5 MG tablet Take 5 mg by mouth daily. Patient not taking: Reported on 10/04/2020 09/19/20   [provider]  Vibegron (GEMTESA) 75 MG TABS Take by mouth. Patient not taking: No sig reported    [provider]    Current  Facility-Administered Medications  Medication Dose Route Frequency Provider Last Rate Last Admin  . amLODipine (NORVASC) tablet 10 mg  10 mg Oral Daily Elgergawy, Silver Huguenin, MD   10 mg at 10/05/20 0018  . benazepril (LOTENSIN) tablet 40 mg  40 mg Oral q AM Elgergawy, Silver Huguenin, MD   40 mg at 10/05/20 0923  . bisacodyl (DULCOLAX) EC tablet 5 mg  5 mg Oral BID Elgergawy, Silver Huguenin, MD   5 mg at 10/05/20 0924  . bisacodyl (DULCOLAX) suppository 10 mg  10 mg Rectal Q1200 Elgergawy, Silver Huguenin, MD      . carbidopa-levodopa (SINEMET IR) 25-100 MG per tablet immediate release 1.5 tablet  1.5 tablet Oral TID Elgergawy, Silver Huguenin, MD   1.5 tablet at 10/05/20 0924  . cholecalciferol (VITAMIN D3) tablet 1,000 Units  1,000 Units Oral q AM Elgergawy, Silver Huguenin, MD   1,000 Units at 10/05/20 0924  . heparin injection 5,000 Units  5,000 Units Subcutaneous Q8H Elgergawy, Silver Huguenin, MD   5,000 Units at 10/05/20 0618  . hydrALAZINE (APRESOLINE) tablet 25 mg  25 mg Oral TID Reubin Milan, MD   25 mg at 10/05/20 [phone]  . memantine (NAMENDA) tablet 10 mg  10 mg Oral Q lunch Elgergawy, Dawood S, MD      . polyethylene glycol (MIRALAX / GLYCOLAX) packet 17 g  17 g Oral BID Elgergawy, Silver Huguenin, MD   17 g at 10/05/20 0924    Allergies as of 10/04/2020  . (No Known Allergies)    Family History  Problem Relation Age of Onset  . Diabetes Mother   . Cancer Father   . Cancer - Lung Brother     Social History   Socioeconomic History  . Marital status: Married    Spouse name: Not on file  . Number of children: Not on file  . Years of education: Not on file  . Highest education level: Not on file  Occupational History  . Not on file  Tobacco Use  . Smoking status: Never Smoker  . Smokeless tobacco: Never Used  Vaping Use  . Vaping Use: Never used  Substance and Sexual Activity  . Alcohol use: Not Currently    Alcohol/week: 0.0 standard drinks  . Drug use: No  . Sexual activity: Not on file  Other Topics  Concern  . Not on file  Social History Narrative   Lives with wife and caregivers 24/7   Right handed   Drinks 1-2 cups caffeine daily   Social Determinants of Health   Financial Resource Strain: Not on file  Food Insecurity: Not on file  Transportation Needs: Not on file  Physical Activity: Not on file  Stress: Not on file  Social Connections: Not on file  Intimate Partner Violence: Not on file    Review of Systems:  As in history of present illness  Physical Exam: Vital signs in last 24 hours: Temp:  [97.5 F (36.4 C)-98.4 F (  36.9 C)] 98.4 F (36.9 C) (03/19 0542) Pulse Rate:  [79-102] 91 (03/19 0926) Resp:  [15-20] 18 (03/19 0542) BP: (121-151)/(64-96) 130/74 (03/19 0926) SpO2:  [92 %-99 %] 92 % (03/19 0542) Weight:  [68.5 kg] 68.5 kg (03/18 2314) Last BM Date: 09/29/20 General:   .  Awake.  Parkinsonian-like facies.  Answers yes and no to simple questions. Eyes:  Sclera clear, no icterus.   Conjunctiva pink. Lungs:  Clear throughout to auscultation.   No wheezes, crackles, or rhonchi. No acute distress. Heart:  Regular rate and rhythm; no murmurs, clicks, rubs,  or gallops. Abdomen: Full.  Positive bowel sounds he has mild diffuse upper abdominal tenderness without discrete mass or organomegaly appreciated.  Rectal: Good sphincter tone no mass in rectal vault.  No stool in the rectal vault..   Intake/Output from previous day: 03/18 0701 - 03/19 0700 In: 1000 [IV Piggyback:1000] Out: 800 [Urine:800] Intake/Output this shift: No intake/output data recorded.  Lab Results: Recent Labs    10/04/20 2017 10/05/20 0535  WBC 12.0* 11.0*  HGB 12.9* 11.6*  HCT 38.8* 35.6*  PLT 324 293   BMET Recent Labs    10/04/20 2017 10/05/20 0535  NA 121* 125*  K 4.2 3.7  CL 90* 96*  CO2 22 20*  GLUCOSE 104* 93  BUN 18 14  CREATININE 0.77 0.61  CALCIUM 8.5* 8.4*   LFT Recent Labs    10/05/20 0535  PROT 6.0*  ALBUMIN 2.9*  AST 21  ALT 7  ALKPHOS 113   BILITOT 0.6   PT/INR No results for input(s): LABPROT, INR in the last 72 hours. Hepatitis Panel No results for input(s): HEPBSAG, HCVAB, HEPAIGM, HEPBIGM in the last 72 hours. C-Diff No results for input(s): CDIFFTOX in the last 72 hours.  Studies/Results: DG Abd 1 View  Result Date: 10/04/2020 CLINICAL DATA:  Lack of bowel movement EXAM: ABDOMEN - 1 VIEW COMPARISON:  02/27/2020 FINDINGS: Prominent stool throughout the colon favors constipation. Thoracic and lumbar spondylosis. Likely chronic suspected lumbar compression fractures at L2, L4, and L5. Poor definition of the left hemidiaphragm, cannot exclude left basilar airspace opacity, consider chest radiography for further workup. Questionable blunting of the right lateral costophrenic angle. IMPRESSION: 1. Prominent stool throughout the colon favors constipation. 2. Poor definition of the left hemidiaphragm, cannot exclude left basilar airspace opacity, consider chest radiography for further workup. 3. Possible small right pleural effusion. 4. Likely chronic lumbar compression fractures at L2, L4, and L5. Electronically Signed   By: Van Clines M.D.   On: 10/04/2020 17:48   CT Head Wo Contrast  Result Date: 10/04/2020 CLINICAL DATA:  Abdominal distension with altered mental status EXAM: CT HEAD WITHOUT CONTRAST TECHNIQUE: Contiguous axial images were obtained from the base of the skull through the vertex without intravenous contrast. COMPARISON:  CT head 02/27/2020 FINDINGS: Brain: Periventricular white matter and corona radiata hypodensities favor chronic ischemic microvascular white matter disease. Otherwise, the brainstem, cerebellum, cerebral peduncles, thalamus, basal ganglia, basilar cisterns, and ventricular system appear within normal limits. No intracranial hemorrhage, mass lesion, or acute CVA. Vascular: There is atherosclerotic calcification of the cavernous carotid arteries bilaterally. Skull: Unremarkable Sinuses/Orbits:  Mild chronic ethmoid sinusitis. Other: No supplemental non-categorized findings. IMPRESSION: 1. No acute intracranial findings. 2. Periventricular white matter and corona radiata hypodensities favor chronic ischemic microvascular white matter disease. 3. Mild chronic ethmoid sinusitis. Electronically Signed   By: Van Clines M.D.   On: 10/04/2020 22:08   CT ABDOMEN PELVIS W CONTRAST  Result  Date: 10/04/2020 CLINICAL DATA:  Abdominal distention and altered mental status, constipation. EXAM: CT ABDOMEN AND PELVIS WITH CONTRAST TECHNIQUE: Multidetector CT imaging of the abdomen and pelvis was performed using the standard protocol following bolus administration of intravenous contrast. CONTRAST:  178mL OMNIPAQUE IOHEXOL 300 MG/ML  SOLN COMPARISON:  Abdomen radiograph 02/27/2020 FINDINGS: Despite efforts by the technologist and patient, motion artifact is present on today's exam and could not be eliminated. This reduces exam sensitivity and specificity. Lower chest: Large bilateral pleural effusions with associated passive atelectasis. These effusions are nonspecific for transudative or exudative etiology. Left anterior descending and right coronary artery atherosclerosis along with descending thoracic aortic atherosclerosis. Small pericardial effusion. Hepatobiliary: Masslike process with marginal enhancement and central hypoenhancement/central necrosis centered in the left hepatic lobe, measuring 14.2 by 10.3 by 9.9 cm on image 51 series 5. Possibilities include malignancy or abscess. Linear lucency inferiorly in segment 3 of the liver could be from a prominent intrahepatic bile duct or possibly portal vein thrombosis of the associated portal vein tributary. The main portal vein is patent. Gallbladder grossly unremarkable, somewhat blurred by motion artifact. Pancreas: Unremarkable Spleen: Unremarkable. Adrenals/Urinary Tract: Both adrenal glands appear normal. Simple appearing cysts of the right kidney.  Urinary bladder unremarkable. Stomach/Bowel: Prominent stool throughout much of the colon, but not in the sigmoid colon or rectum. It is difficult to identify the site of transition due to the motion artifact in today's exam. However, it is probably just distal to the splenic flexure. Borderline rectal wall thickening, without surrounding adenopathy. Vascular/Lymphatic: Aortoiliac atherosclerotic vascular disease. Reproductive: Mild prostatomegaly. Other: Mild diffuse subcutaneous and mesenteric edema. Musculoskeletal: Remote compression fractures at L2, L4, and L5. Lumbar spondylosis and degenerative disc disease causing multilevel impingement. IMPRESSION: 1. Masslike process with marginal enhancement and central necrosis centered in the left hepatic lobe, measuring 14.2 by 10.3 by 9.9 cm (volume = 760 cm^3). Possibilities include malignancy or abscess. Correlate with 2. Linear lucency inferiorly in segment 3 of the liver could be from a dilated intrahepatic bile duct or possibly portal vein thrombosis of the associated portal vein tributary. 3. Large bilateral pleural effusions with associated passive atelectasis. These effusions are nonspecific for transudative or exudative etiology. 4. Prominent stool throughout much of the colon, but not in the sigmoid colon or rectum. Borderline rectal wall thickening, without surrounding adenopathy. 5. Other imaging findings of potential clinical significance: Coronary atherosclerosis. Small pericardial effusion. Remote compression fractures at L2, L4, and L5. Lumbar spondylosis and degenerative disc disease causing multilevel impingement. Mild prostatomegaly. 6. Despite efforts by the technologist and patient, motion artifact is present on today's exam and could not be eliminated. This reduces exam sensitivity and specificity. 7. Aortic atherosclerosis. Aortic Atherosclerosis (ICD10-I70.0). Electronically Signed   By: Van Clines M.D.   On: 10/04/2020 22:05   DG  Chest Port 1 View  Result Date: 10/04/2020 CLINICAL DATA:  Weakness EXAM: PORTABLE CHEST 1 VIEW COMPARISON:  10/20/2015 FINDINGS: Bilateral layering pleural effusions. Bilateral lower lobe airspace opacities. Heart is normal size. Aortic atherosclerosis. No acute bony abnormality. IMPRESSION: Bilateral layering effusions with bibasilar atelectasis or infiltrates. Electronically Signed   By: Rolm Baptise M.D.   On: 10/04/2020 19:45   Impression: 87 year old gentleman with history of dementia, subdural hematoma and progressive supranuclear palsy admitted to the hospital with progressive weakness acute on chronic hyponatremia, liver lesion and progressing constipation.  I personally reviewed the CT images with Dr. Janeece Fitting.  CT was of suboptimal quality secondary to movement.  However, it clearly does  show a very large stool colon burden with relative paucity of stool in the sigmoid and rectum.  No mechanical obstruction seen. Liver lesion concerning for neoplasm although an abscess also remains  in the differential.  He has a significant constipation.  More likely part and parcel of a neurogenic bowel and bladder related to CNS disease.  Recommendations:  We will pursue a polyethylene glycol purge this afternoon -discussed with family and nursing staff.  Schedules bowel regimen after he is cleaned out perhaps utilizing Linzess  Would opt to send the patient to interventional radiology the first of the week for sampling of the liver lesion.  Dr. Janeece Fitting feels its location would be technically very amenable to sampling.  A diagnostic colonoscopy would be a consideration here but I would favor pursuing the liver lesion first.  I have discussed my impression and recommendations at length with Luis Freeman, daughter, at the bedside.  Family is interested in determining the etiology of the liver lesion to facilitate decision making regarding care.  Further recommendations to  follow.      Notice:  This dictation was prepared with Dragon dictation along with smaller phrase technology. Any transcriptional errors that result from this process are unintentional and may not be corrected upon review.

## 2020-10-05 NOTE — Progress Notes (Signed)
RN informed by [employer] monitoring of Bundle branch block and ? PAC's non sustained. Unable to assess patient for symptoms, patient has dementia and non verbal. Patient on SR upon review, VS baseline. MD made aware.

## 2020-10-05 NOTE — Progress Notes (Signed)
PROGRESS NOTE   Luis Freeman  NWG:[id number] DOB: 08-23-1932 DOA: 10/04/2020 PCP: [name], MD   Chief Complaint  Patient presents with  . Weakness   Level of care: Telemetry  Brief Admission History:  87 y.o. male, with medical history significant fordementia, progressive supranuclear palsy, gait abnormality.  History of fall in the past with subdural hematoma, neurogenic bladder, hypertension, B12 deficiency. Patient was brought to ED by his daughter, due to concerns of difficulty passing urine, and severe constipation, patient is total care currently, he is having 24-hour with her family or paid caregiver, patient with progressive weakness over last few days, has been less interactive as well, with difficulty passing urine, and no good bowel movement for few days, he saw his urologist yesterday, for dark-colored urine, family were told no UTI, no acute findings, per report patient mental status has decreased, he is not as responsive as usual, patient with dementia, cannot provide any history, it was provided by daughter. - in ED patient had sodium of 121, usually it is in the upper 120s, CT abdomen pelvis significant for significant stool burden, and abnormal liver findings suspicious for malignancy versus abscess, [facility] hospitalist consulted to admit.  Assessment & Plan:   Active Problems:   Dementia ([place])   Movement disorder   Gait disorder   HTN (hypertension)   Acute on Chronic HypoNatremia  Hyponatremia - concerning for SIADH - Improving - Continue fluid restriction  Bilateral large pleural effusion  - Discussed with family - Family to decide if we would pursue thoracentesis and fluid testing - If they decide to proceed could ask Dr. Thornton Papas to perform on Monday  Newly found Liver Lesion  - Concerning for malignancy versus infection - Appreciate GI consultation, plan to pursue IR guided biopsy - Given his extreme frailty and advanced age, I have asked for a palliative  care consultation  DNR  - Documents in chart, continue DNR order in hospital  Dementia / Parkinson's  - stable, resumed home meds  Severe Constipation  - Appreciate GI consult and orders for miralax clean out  Acute urinary retention  - Given severe frailty and family against starting Flomax would keep foley in for now   DVT prophylaxis: heparin Code Status: DNR  Family Communication: 2 daughters at bedside Disposition: TBD Status is: Inpatient  Remains inpatient appropriate because:Ongoing diagnostic testing needed not appropriate for outpatient work up, IV treatments appropriate due to intensity of illness or inability to take PO and Inpatient level of care appropriate due to severity of illness   Dispo: The patient is from: Home              Anticipated d/c is to: Home              Patient currently is not medically stable to d/c.   Difficult to place patient No   Consultants:   GI hepatology  Procedures:   *  Antimicrobials:  n/a  Subjective: Unable to determine due to chronic condition  Objective: Vitals:   10/05/20 0140 10/05/20 0542 10/05/20 0926 10/05/20 1414  BP: 129/80 133/68 130/74 135/68  Pulse: 90 91 91 88  Resp: 18 18    Temp: 97.9 F (36.6 C) 98.4 F (36.9 C)  98.4 F (36.9 C)  TempSrc: Oral   Oral  SpO2: 94% 92%  95%  Weight:      Height:        Intake/Output Summary (Last 24 hours) at 10/05/2020 1451 Last data filed at  10/05/2020 1440 Gross per 24 hour  Intake 1000 ml  Output 1800 ml  Net -800 ml   Filed Weights   10/04/20 1907 10/04/20 2314  Weight: 68.5 kg 68.5 kg   Examination:  General exam: extremely frail, elderly, mostly nonverbal male, with warm hands, soft grip, Appears calm and comfortable  Respiratory system: diminished breath sounds in both bases. Respiratory effort normal. Cardiovascular system: normal S1 & S2 heard. No JVD, murmurs, rubs, gallops or clicks. No pedal edema. Gastrointestinal system: Abdomen is  nondistended, soft and nontender. No organomegaly or masses felt. Normal bowel sounds heard. Central nervous system: Alert. No focal neurological deficits. Extremities: warm with good peripheral pulses palpated. Skin: No gross rashes, lesions or ulcers Psychiatry: Judgement and insight unable to determine. Mood & affect seems appropriate but difficult to determine.   Data Reviewed: I have personally reviewed following labs and imaging studies  CBC: Recent Labs  Lab 10/04/20 2017 10/05/20 0535  WBC 12.0* 11.0*  NEUTROABS 10.3*  --   HGB 12.9* 11.6*  HCT 38.8* 35.6*  MCV 96.0 97.0  PLT 324 527    Basic Metabolic Panel: Recent Labs  Lab 10/04/20 2017 10/05/20 0535  NA 121* 125*  K 4.2 3.7  CL 90* 96*  CO2 22 20*  GLUCOSE 104* 93  BUN 18 14  CREATININE 0.77 0.61  CALCIUM 8.5* 8.4*    GFR: Estimated Creatinine Clearance: 65.4 mL/min (by C-G formula based on SCr of 0.61 mg/dL).  Liver Function Tests: Recent Labs  Lab 10/04/20 2017 10/05/20 0535  AST 29 21  ALT 8 7  ALKPHOS 134* 113  BILITOT 0.6 0.6  PROT 7.1 6.0*  ALBUMIN 3.5 2.9*    CBG: No results for input(s): GLUCAP in the last 168 hours.  No results found for this or any previous visit (from the past 240 hour(s)).   Radiology Studies: DG Abd 1 View  Result Date: 10/04/2020 CLINICAL DATA:  Lack of bowel movement EXAM: ABDOMEN - 1 VIEW COMPARISON:  02/27/2020 FINDINGS: Prominent stool throughout the colon favors constipation. Thoracic and lumbar spondylosis. Likely chronic suspected lumbar compression fractures at L2, L4, and L5. Poor definition of the left hemidiaphragm, cannot exclude left basilar airspace opacity, consider chest radiography for further workup. Questionable blunting of the right lateral costophrenic angle. IMPRESSION: 1. Prominent stool throughout the colon favors constipation. 2. Poor definition of the left hemidiaphragm, cannot exclude left basilar airspace opacity, consider chest  radiography for further workup. 3. Possible small right pleural effusion. 4. Likely chronic lumbar compression fractures at L2, L4, and L5. Electronically Signed   By: Van Clines M.D.   On: 10/04/2020 17:48   CT Head Wo Contrast  Result Date: 10/04/2020 CLINICAL DATA:  Abdominal distension with altered mental status EXAM: CT HEAD WITHOUT CONTRAST TECHNIQUE: Contiguous axial images were obtained from the base of the skull through the vertex without intravenous contrast. COMPARISON:  CT head 02/27/2020 FINDINGS: Brain: Periventricular white matter and corona radiata hypodensities favor chronic ischemic microvascular white matter disease. Otherwise, the brainstem, cerebellum, cerebral peduncles, thalamus, basal ganglia, basilar cisterns, and ventricular system appear within normal limits. No intracranial hemorrhage, mass lesion, or acute CVA. Vascular: There is atherosclerotic calcification of the cavernous carotid arteries bilaterally. Skull: Unremarkable Sinuses/Orbits: Mild chronic ethmoid sinusitis. Other: No supplemental non-categorized findings. IMPRESSION: 1. No acute intracranial findings. 2. Periventricular white matter and corona radiata hypodensities favor chronic ischemic microvascular white matter disease. 3. Mild chronic ethmoid sinusitis. Electronically Signed   By: Van Clines  M.D.   On: 10/04/2020 22:08   CT ABDOMEN PELVIS W CONTRAST  Result Date: 10/04/2020 CLINICAL DATA:  Abdominal distention and altered mental status, constipation. EXAM: CT ABDOMEN AND PELVIS WITH CONTRAST TECHNIQUE: Multidetector CT imaging of the abdomen and pelvis was performed using the standard protocol following bolus administration of intravenous contrast. CONTRAST:  128mL OMNIPAQUE IOHEXOL 300 MG/ML  SOLN COMPARISON:  Abdomen radiograph 02/27/2020 FINDINGS: Despite efforts by the technologist and patient, motion artifact is present on today's exam and could not be eliminated. This reduces exam  sensitivity and specificity. Lower chest: Large bilateral pleural effusions with associated passive atelectasis. These effusions are nonspecific for transudative or exudative etiology. Left anterior descending and right coronary artery atherosclerosis along with descending thoracic aortic atherosclerosis. Small pericardial effusion. Hepatobiliary: Masslike process with marginal enhancement and central hypoenhancement/central necrosis centered in the left hepatic lobe, measuring 14.2 by 10.3 by 9.9 cm on image 51 series 5. Possibilities include malignancy or abscess. Linear lucency inferiorly in segment 3 of the liver could be from a prominent intrahepatic bile duct or possibly portal vein thrombosis of the associated portal vein tributary. The main portal vein is patent. Gallbladder grossly unremarkable, somewhat blurred by motion artifact. Pancreas: Unremarkable Spleen: Unremarkable. Adrenals/Urinary Tract: Both adrenal glands appear normal. Simple appearing cysts of the right kidney. Urinary bladder unremarkable. Stomach/Bowel: Prominent stool throughout much of the colon, but not in the sigmoid colon or rectum. It is difficult to identify the site of transition due to the motion artifact in today's exam. However, it is probably just distal to the splenic flexure. Borderline rectal wall thickening, without surrounding adenopathy. Vascular/Lymphatic: Aortoiliac atherosclerotic vascular disease. Reproductive: Mild prostatomegaly. Other: Mild diffuse subcutaneous and mesenteric edema. Musculoskeletal: Remote compression fractures at L2, L4, and L5. Lumbar spondylosis and degenerative disc disease causing multilevel impingement. IMPRESSION: 1. Masslike process with marginal enhancement and central necrosis centered in the left hepatic lobe, measuring 14.2 by 10.3 by 9.9 cm (volume = 760 cm^3). Possibilities include malignancy or abscess. Correlate with 2. Linear lucency inferiorly in segment 3 of the liver could be  from a dilated intrahepatic bile duct or possibly portal vein thrombosis of the associated portal vein tributary. 3. Large bilateral pleural effusions with associated passive atelectasis. These effusions are nonspecific for transudative or exudative etiology. 4. Prominent stool throughout much of the colon, but not in the sigmoid colon or rectum. Borderline rectal wall thickening, without surrounding adenopathy. 5. Other imaging findings of potential clinical significance: Coronary atherosclerosis. Small pericardial effusion. Remote compression fractures at L2, L4, and L5. Lumbar spondylosis and degenerative disc disease causing multilevel impingement. Mild prostatomegaly. 6. Despite efforts by the technologist and patient, motion artifact is present on today's exam and could not be eliminated. This reduces exam sensitivity and specificity. 7. Aortic atherosclerosis. Aortic Atherosclerosis (ICD10-I70.0). Electronically Signed   By: Van Clines M.D.   On: 10/04/2020 22:05   DG Chest Port 1 View  Result Date: 10/04/2020 CLINICAL DATA:  Weakness EXAM: PORTABLE CHEST 1 VIEW COMPARISON:  10/20/2015 FINDINGS: Bilateral layering pleural effusions. Bilateral lower lobe airspace opacities. Heart is normal size. Aortic atherosclerosis. No acute bony abnormality. IMPRESSION: Bilateral layering effusions with bibasilar atelectasis or infiltrates. Electronically Signed   By: Rolm Baptise M.D.   On: 10/04/2020 19:45    Scheduled Meds: . amLODipine  10 mg Oral Daily  . benazepril  40 mg Oral q AM  . bisacodyl  5 mg Oral BID  . bisacodyl  10 mg Rectal Q1200  . carbidopa-levodopa  1.5 tablet Oral TID  . cholecalciferol  1,000 Units Oral q AM  . heparin  5,000 Units Subcutaneous Q8H  . hydrALAZINE  25 mg Oral TID  . memantine  10 mg Oral Q lunch   Continuous Infusions:   LOS: 1 day   Time spent: 38 mins   Annahi Short Wynetta Emery, MD How to contact the [facility] Attending or Consulting provider [place] or covering  provider during after hours [place], for this patient?  1. Check the care team in [facility] and look for a) attending/consulting [facility] provider listed and b) the [facility] team listed 2. Log into [email or web address] and use [place] universal password to access. If you do not have the password, please contact the hospital operator. 3. Locate the [facility] provider you are looking for under [facility] and page to a number that you can be directly reached. 4. If you still have difficulty reaching the provider, please page the [facility] (Director on Call) for the Hospitalists listed on amion for assistance.  10/05/2020, 2:51 PM

## 2020-10-06 ENCOUNTER — Encounter: Payer: Self-pay | Admitting: Neurology

## 2020-10-06 DIAGNOSIS — R933 Abnormal findings on diagnostic imaging of other parts of digestive tract: Secondary | ICD-10-CM | POA: Diagnosis not present

## 2020-10-06 DIAGNOSIS — E871 Hypo-osmolality and hyponatremia: Secondary | ICD-10-CM | POA: Diagnosis not present

## 2020-10-06 DIAGNOSIS — K5900 Constipation, unspecified: Secondary | ICD-10-CM | POA: Diagnosis not present

## 2020-10-06 DIAGNOSIS — I10 Essential (primary) hypertension: Secondary | ICD-10-CM | POA: Diagnosis not present

## 2020-10-06 DIAGNOSIS — G301 Alzheimer's disease with late onset: Secondary | ICD-10-CM | POA: Diagnosis not present

## 2020-10-06 DIAGNOSIS — R269 Unspecified abnormalities of gait and mobility: Secondary | ICD-10-CM | POA: Diagnosis not present

## 2020-10-06 LAB — CBC WITH DIFFERENTIAL/PLATELET
Abs Immature Granulocytes: 0.17 10*3/uL — ABNORMAL HIGH (ref 0.00–0.07)
Basophils Absolute: 0 10*3/uL (ref 0.0–0.1)
Basophils Relative: 0 %
Eosinophils Absolute: 0 10*3/uL (ref 0.0–0.5)
Eosinophils Relative: 0 %
HCT: 37.6 % — ABNORMAL LOW (ref 39.0–52.0)
Hemoglobin: 12.3 g/dL — ABNORMAL LOW (ref 13.0–17.0)
Immature Granulocytes: 1 %
Lymphocytes Relative: 3 %
Lymphs Abs: 0.4 10*3/uL — ABNORMAL LOW (ref 0.7–4.0)
MCH: 31.5 pg (ref 26.0–34.0)
MCHC: 32.7 g/dL (ref 30.0–36.0)
MCV: 96.2 fL (ref 80.0–100.0)
Monocytes Absolute: 0.9 10*3/uL (ref 0.1–1.0)
Monocytes Relative: 7 %
Neutro Abs: 12.4 10*3/uL — ABNORMAL HIGH (ref 1.7–7.7)
Neutrophils Relative %: 89 %
Platelets: 356 10*3/uL (ref 150–400)
RBC: 3.91 MIL/uL — ABNORMAL LOW (ref 4.22–5.81)
RDW: 12.7 % (ref 11.5–15.5)
WBC: 13.9 10*3/uL — ABNORMAL HIGH (ref 4.0–10.5)
nRBC: 0 % (ref 0.0–0.2)

## 2020-10-06 LAB — BASIC METABOLIC PANEL
Anion gap: 10 (ref 5–15)
BUN: 17 mg/dL (ref 8–23)
CO2: 21 mmol/L — ABNORMAL LOW (ref 22–32)
Calcium: 8.6 mg/dL — ABNORMAL LOW (ref 8.9–10.3)
Chloride: 96 mmol/L — ABNORMAL LOW (ref 98–111)
Creatinine, Ser: 0.68 mg/dL (ref 0.61–1.24)
GFR, Estimated: >60 mL/min (ref >60)
Glucose, Bld: 102 mg/dL — ABNORMAL HIGH (ref 70–99)
Potassium: 4.3 mmol/L (ref 3.5–5.1)
Sodium: 127 mmol/L — ABNORMAL LOW (ref 135–145)

## 2020-10-06 LAB — URINE CULTURE: Culture: NO GROWTH

## 2020-10-06 LAB — MAGNESIUM: Magnesium: 2 mg/dL (ref 1.7–2.4)

## 2020-10-06 LAB — GLUCOSE, CAPILLARY: Glucose-Capillary: 106 mg/dL — ABNORMAL HIGH (ref 70–99)

## 2020-10-06 MED ORDER — SORBITOL 70 % SOLN: 960.0000 mL | TOPICAL_OIL | Freq: Once | ORAL | Status: DC

## 2020-10-06 MED ORDER — CHLORHEXIDINE GLUCONATE CLOTH 2 % EX PADS
6.0000 | MEDICATED_PAD | Freq: Every day | CUTANEOUS | Status: DC
  Administered 2020-10-06 – 2020-10-11 (×5): 6 via TOPICAL

## 2020-10-06 NOTE — Progress Notes (Addendum)
Per family mbr at bedside patient has had several bouts of nausea resulting in vomiting. Per prior MD verbal request GI bowel prep will continue to be held at this time. II will continue to monitor patient.

## 2020-10-06 NOTE — Progress Notes (Signed)
PROGRESS NOTE   Luis MEININGER  QIW:[id number] DOB: 05-07-1933 DOA: 10/04/2020 PCP: [name], MD   Chief Complaint  Patient presents with  . Weakness   Level of care: Telemetry  Brief Admission History:  87 y.o. male, with medical history significant fordementia, progressive supranuclear palsy, gait abnormality.  History of fall in the past with subdural hematoma, neurogenic bladder, hypertension, B12 deficiency. Patient was brought to ED by his daughter, due to concerns of difficulty passing urine, and severe constipation, patient is total care currently, he is having 24-hour with her family or paid caregiver, patient with progressive weakness over last few days, has been less interactive as well, with difficulty passing urine, and no good bowel movement for few days, he saw his urologist yesterday, for dark-colored urine, family were told no UTI, no acute findings, per report patient mental status has decreased, he is not as responsive as usual, patient with dementia, cannot provide any history, it was provided by daughter. - in ED patient had sodium of 121, usually it is in the upper 120s, CT abdomen pelvis significant for significant stool burden, and abnormal liver findings suspicious for malignancy versus abscess, [facility] hospitalist consulted to admit.  Assessment & Plan:   Active Problems:   Dementia ([place])   Movement disorder   Gait disorder   HTN (hypertension)   Acute on Chronic HypoNatremia  Hyponatremia - concerning for SIADH - Improving - Continue fluid restriction  Bilateral large pleural effusion  - Discussed with family - Family to decide if we would pursue thoracentesis and fluid testing - If they decide to proceed could ask Dr. Thornton Papas to perform on Monday  Newly found Liver Lesion  - Concerning for malignancy versus infection - Appreciate GI consultation, plan to pursue IR guided biopsy - Given his extreme frailty and advanced age, I have asked for a palliative  care consultation  DNR  - Documents in chart, continue DNR order in hospital  Dementia / Parkinson's  - stable, resumed home meds  Severe Constipation  - Appreciate GI consult and orders for gastrograffin enema on 3/21  Acute urinary retention  - Given severe frailty and family against starting Flomax would keep foley in for now   DVT prophylaxis: heparin Code Status: DNR  Family Communication: daughter at bedside Disposition: TBD Status is: Inpatient  Remains inpatient appropriate because:Ongoing diagnostic testing needed not appropriate for outpatient work up, IV treatments appropriate due to intensity of illness or inability to take PO and Inpatient level of care appropriate due to severity of illness  Dispo: The patient is from: Home              Anticipated d/c is to: Home              Patient currently is not medically stable to d/c.   Difficult to place patient No   Consultants:   GI hepatology  Procedures:   *  Antimicrobials:  n/a  Subjective: Unable to determine due to chronic condition  Objective: Vitals:   10/05/20 1414 10/05/20 1739 10/05/20 2018 10/06/20 0516  BP: 135/68 (!) 148/76 (!) 141/90 (!) 158/68  Pulse: 88 79 96 91  Resp:      Temp: 98.4 F (36.9 C)  98.1 F (36.7 C) 98.6 F (37 C)  TempSrc: Oral  Oral Oral  SpO2: 95%  90% 100%  Weight:      Height:        Intake/Output Summary (Last 24 hours) at 10/06/2020 1243 Last data  filed at 10/05/2020 2220 Gross per 24 hour  Intake --  Output 1300 ml  Net -1300 ml   Filed Weights   10/04/20 1907 10/04/20 2314  Weight: 68.5 kg 68.5 kg   Examination:  General exam: extremely frail, elderly, mostly nonverbal male, with warm hands, soft grip, Appears calm and comfortable  Respiratory system: diminished breath sounds in both bases. Respiratory effort normal. Cardiovascular system: normal S1 & S2 heard. No JVD, murmurs, rubs, gallops or clicks. No pedal edema. Gastrointestinal system:  Abdomen is distended, soft. No organomegaly or masses felt. Normal bowel sounds heard. Central nervous system: somnolent, No focal neurological deficits. Extremities: warm with good peripheral pulses palpated. Skin: No gross rashes, lesions or ulcers Psychiatry: Judgement and insight unable to determine. Mood & affect seems appropriate but difficult to determine.   Data Reviewed: I have personally reviewed following labs and imaging studies  CBC: Recent Labs  Lab 10/04/20 2017 10/05/20 0535 10/06/20 0451  WBC 12.0* 11.0* 13.9*  NEUTROABS 10.3*  --  12.4*  HGB 12.9* 11.6* 12.3*  HCT 38.8* 35.6* 37.6*  MCV 96.0 97.0 96.2  PLT 324 293 376    Basic Metabolic Panel: Recent Labs  Lab 10/04/20 2017 10/05/20 0535 10/06/20 0451  NA 121* 125* 127*  K 4.2 3.7 4.3  CL 90* 96* 96*  CO2 22 20* 21*  GLUCOSE 104* 93 102*  BUN 18 14 17   CREATININE 0.77 0.61 0.68  CALCIUM 8.5* 8.4* 8.6*  MG  --   --  2.0    GFR: Estimated Creatinine Clearance: 65.4 mL/min (by C-G formula based on SCr of 0.68 mg/dL).  Liver Function Tests: Recent Labs  Lab 10/04/20 2017 10/05/20 0535  AST 29 21  ALT 8 7  ALKPHOS 134* 113  BILITOT 0.6 0.6  PROT 7.1 6.0*  ALBUMIN 3.5 2.9*    CBG: Recent Labs  Lab 10/06/20 0738  GLUCAP 106*    Recent Results (from the past 240 hour(s))  SARS CORONAVIRUS 2 (TAT 6-24 HRS) Nasopharyngeal Nasopharyngeal Swab     Status: None   Collection Time: 10/04/20 10:00 PM   Specimen: Nasopharyngeal Swab  Result Value Ref Range Status   SARS Coronavirus 2 NEGATIVE NEGATIVE Final    Comment: (NOTE) SARS-CoV-2 target nucleic acids are NOT DETECTED.  The SARS-CoV-2 RNA is generally detectable in upper and lower respiratory specimens during the acute phase of infection. Negative results do not preclude SARS-CoV-2 infection, do not rule out co-infections with other pathogens, and should not be used as the sole basis for treatment or other patient management  decisions. Negative results must be combined with clinical observations, patient history, and epidemiological information. The expected result is Negative.  Fact Sheet for Patients: SugarRoll.be  Fact Sheet for Healthcare Providers: https://www.woods-mathews.com/  This test is not yet approved or cleared by the [place] FDA and  has been authorized for detection and/or diagnosis of SARS-CoV-2 by FDA under an Emergency Use Authorization (EUA). This EUA will remain  in effect (meaning this test can be used) for the duration of the COVID-19 declaration under Se ction 564(b)(1) of the Act, 21 U.S.C. section 360bbb-3(b)(1), unless the authorization is terminated or revoked sooner.  Performed at [facility], [place] [address] [place], [place] [address]   Urine Culture     Status: None   Collection Time: 10/04/20 10:41 PM   Specimen: Urine, Clean Catch  Result Value Ref Range Status   Specimen Description   Final    URINE,  CLEAN CATCH Performed at [facility], [address] [place], [place] [address]    Special Requests   Final    NONE Performed at [facility], [address], [place], [place] [address]    Culture   Final    NO GROWTH Performed at [facility], [place] [address] [place], [place] [address]    Report Status 10/06/2020 FINAL  Final     Radiology Studies: DG Abd 1 View  Result Date: 10/04/2020 CLINICAL DATA:  Lack of bowel movement EXAM: ABDOMEN - 1 VIEW COMPARISON:  02/27/2020 FINDINGS: Prominent stool throughout the colon favors constipation. Thoracic and lumbar spondylosis. Likely chronic suspected lumbar compression fractures at L2, L4, and L5. Poor definition of the left hemidiaphragm, cannot exclude left basilar airspace opacity, consider chest radiography for further workup. Questionable blunting of the right lateral costophrenic angle. IMPRESSION: 1. Prominent stool throughout the colon favors  constipation. 2. Poor definition of the left hemidiaphragm, cannot exclude left basilar airspace opacity, consider chest radiography for further workup. 3. Possible small right pleural effusion. 4. Likely chronic lumbar compression fractures at L2, L4, and L5. Electronically Signed   By: Van Clines M.D.   On: 10/04/2020 17:48   CT Head Wo Contrast  Result Date: 10/04/2020 CLINICAL DATA:  Abdominal distension with altered mental status EXAM: CT HEAD WITHOUT CONTRAST TECHNIQUE: Contiguous axial images were obtained from the base of the skull through the vertex without intravenous contrast. COMPARISON:  CT head 02/27/2020 FINDINGS: Brain: Periventricular white matter and corona radiata hypodensities favor chronic ischemic microvascular white matter disease. Otherwise, the brainstem, cerebellum, cerebral peduncles, thalamus, basal ganglia, basilar cisterns, and ventricular system appear within normal limits. No intracranial hemorrhage, mass lesion, or acute CVA. Vascular: There is atherosclerotic calcification of the cavernous carotid arteries bilaterally. Skull: Unremarkable Sinuses/Orbits: Mild chronic ethmoid sinusitis. Other: No supplemental non-categorized findings. IMPRESSION: 1. No acute intracranial findings. 2. Periventricular white matter and corona radiata hypodensities favor chronic ischemic microvascular white matter disease. 3. Mild chronic ethmoid sinusitis. Electronically Signed   By: Van Clines M.D.   On: 10/04/2020 22:08   CT ABDOMEN PELVIS W CONTRAST  Result Date: 10/04/2020 CLINICAL DATA:  Abdominal distention and altered mental status, constipation. EXAM: CT ABDOMEN AND PELVIS WITH CONTRAST TECHNIQUE: Multidetector CT imaging of the abdomen and pelvis was performed using the standard protocol following bolus administration of intravenous contrast. CONTRAST:  151mL OMNIPAQUE IOHEXOL 300 MG/ML  SOLN COMPARISON:  Abdomen radiograph 02/27/2020 FINDINGS: Despite efforts by the  technologist and patient, motion artifact is present on today's exam and could not be eliminated. This reduces exam sensitivity and specificity. Lower chest: Large bilateral pleural effusions with associated passive atelectasis. These effusions are nonspecific for transudative or exudative etiology. Left anterior descending and right coronary artery atherosclerosis along with descending thoracic aortic atherosclerosis. Small pericardial effusion. Hepatobiliary: Masslike process with marginal enhancement and central hypoenhancement/central necrosis centered in the left hepatic lobe, measuring 14.2 by 10.3 by 9.9 cm on image 51 series 5. Possibilities include malignancy or abscess. Linear lucency inferiorly in segment 3 of the liver could be from a prominent intrahepatic bile duct or possibly portal vein thrombosis of the associated portal vein tributary. The main portal vein is patent. Gallbladder grossly unremarkable, somewhat blurred by motion artifact. Pancreas: Unremarkable Spleen: Unremarkable. Adrenals/Urinary Tract: Both adrenal glands appear normal. Simple appearing cysts of the right kidney. Urinary bladder unremarkable. Stomach/Bowel: Prominent stool throughout much of the colon, but not in the sigmoid colon or rectum. It is  difficult to identify the site of transition due to the motion artifact in today's exam. However, it is probably just distal to the splenic flexure. Borderline rectal wall thickening, without surrounding adenopathy. Vascular/Lymphatic: Aortoiliac atherosclerotic vascular disease. Reproductive: Mild prostatomegaly. Other: Mild diffuse subcutaneous and mesenteric edema. Musculoskeletal: Remote compression fractures at L2, L4, and L5. Lumbar spondylosis and degenerative disc disease causing multilevel impingement. IMPRESSION: 1. Masslike process with marginal enhancement and central necrosis centered in the left hepatic lobe, measuring 14.2 by 10.3 by 9.9 cm (volume = 760 cm^3).  Possibilities include malignancy or abscess. Correlate with 2. Linear lucency inferiorly in segment 3 of the liver could be from a dilated intrahepatic bile duct or possibly portal vein thrombosis of the associated portal vein tributary. 3. Large bilateral pleural effusions with associated passive atelectasis. These effusions are nonspecific for transudative or exudative etiology. 4. Prominent stool throughout much of the colon, but not in the sigmoid colon or rectum. Borderline rectal wall thickening, without surrounding adenopathy. 5. Other imaging findings of potential clinical significance: Coronary atherosclerosis. Small pericardial effusion. Remote compression fractures at L2, L4, and L5. Lumbar spondylosis and degenerative disc disease causing multilevel impingement. Mild prostatomegaly. 6. Despite efforts by the technologist and patient, motion artifact is present on today's exam and could not be eliminated. This reduces exam sensitivity and specificity. 7. Aortic atherosclerosis. Aortic Atherosclerosis (ICD10-I70.0). Electronically Signed   By: Van Clines M.D.   On: 10/04/2020 22:05   DG Chest Port 1 View  Result Date: 10/04/2020 CLINICAL DATA:  Weakness EXAM: PORTABLE CHEST 1 VIEW COMPARISON:  10/20/2015 FINDINGS: Bilateral layering pleural effusions. Bilateral lower lobe airspace opacities. Heart is normal size. Aortic atherosclerosis. No acute bony abnormality. IMPRESSION: Bilateral layering effusions with bibasilar atelectasis or infiltrates. Electronically Signed   By: Rolm Baptise M.D.   On: 10/04/2020 19:45    Scheduled Meds: . amLODipine  10 mg Oral Daily  . benazepril  40 mg Oral q AM  . bisacodyl  10 mg Rectal Q1200  . carbidopa-levodopa  1.5 tablet Oral TID  . Chlorhexidine Gluconate Cloth  6 each Topical Daily  . cholecalciferol  1,000 Units Oral q AM  . heparin  5,000 Units Subcutaneous Q8H  . hydrALAZINE  25 mg Oral TID  . memantine  10 mg Oral Q lunch   Continuous  Infusions:   LOS: 2 days   Time spent: 35 mins   Jovany Disano Wynetta Emery, MD How to contact the [facility] Attending or Consulting provider [place] or covering provider during after hours [place], for this patient?  1. Check the care team in [facility] and look for a) attending/consulting [facility] provider listed and b) the [facility] team listed 2. Log into [email or web address] and use [place]'s universal password to access. If you do not have the password, please contact the hospital operator. 3. Locate the [facility] provider you are looking for under [facility] and page to a number that you can be directly reached. 4. If you still have difficulty reaching the provider, please page the [facility] (Director on Call) for the Hospitalists listed on amion for assistance.  10/06/2020, 12:43 PM

## 2020-10-06 NOTE — Progress Notes (Signed)
On assessment patients abdomen found to be quite distended. He is also having periods of dry heaving resulting in some regurgitation to his mouth. I paged the provider via Amion and informed of the above issues. Order given for Zofran, RN also instructed to stop GI prep till 0000 if the above scenario persists. I will continue to monitor patient as the shift ensues

## 2020-10-06 NOTE — Plan of Care (Signed)
  Problem: Education: Goal: Knowledge of General Education information will improve Description Including pain rating scale, medication(s)/side effects and non-pharmacologic comfort measures Outcome: Progressing   Problem: Health Behavior/Discharge Planning: Goal: Ability to manage health-related needs will improve Outcome: Progressing   

## 2020-10-06 NOTE — Progress Notes (Signed)
Discussed progress over the past 24 hours with Ilene Qua, patient's daughter and nursing staff at the bedside. Attempted GoLYTELY purge yesterday did not work.  Patient vomited.  Did not really get keep any down.  No BM past 24 hours.  Sodium 127 this a.m.  Of note, Dr. Willey Blade communicated with me last evening  -  patient sodium was 135 on 2/26 and 134 on 1/13.  Ms. Ilene Qua endorses the observation at home the patient's decline in mental status and bowel function started at about the same time a week ago.  Vital signs in last 24 hours: Temp:  [98.1 F (36.7 C)-98.6 F (37 C)] 98.6 F (37 C) (03/20 0516) Pulse Rate:  [79-96] 91 (03/20 0516) BP: (135-158)/(68-90) 158/68 (03/20 0516) SpO2:  [90 %-100 %] 100 % (03/20 0516) Last BM Date: 09/29/20 General:   Appears to be resting comfortably.  He is awake but no verbalization.  Daughter at the bedside. Abdomen: Significant abdominal distention.  Bowel sounds present.  Mild diffuse tenderness.   Extremities:  Without clubbing or edema.    Intake/Output from previous day: 03/19 0701 - 03/20 0700 In: -  Out: 1300 [Urine:1300] Intake/Output this shift: No intake/output data recorded.  Lab Results: Recent Labs    10/04/20 2017 10/05/20 0535 10/06/20 0451  WBC 12.0* 11.0* 13.9*  HGB 12.9* 11.6* 12.3*  HCT 38.8* 35.6* 37.6*  PLT 324 293 356   BMET Recent Labs    10/04/20 2017 10/05/20 0535 10/06/20 0451  NA 121* 125* 127*  K 4.2 3.7 4.3  CL 90* 96* 96*  CO2 22 20* 21*  GLUCOSE 104* 93 102*  BUN 18 14 17   CREATININE 0.77 0.61 0.68  CALCIUM 8.5* 8.4* 8.6*   LFT Recent Labs    10/05/20 0535  PROT 6.0*  ALBUMIN 2.9*  AST 21  ALT 7  ALKPHOS 113  BILITOT 0.6   PT/INR No results for input(s): LABPROT, INR in the last 72 hours. Hepatitis Panel No results for input(s): HEPBSAG, HCVAB, HEPAIGM, HEPBIGM in the last 72 hours. C-Diff No results for input(s): CDIFFTOX in the last 72  hours.  Studies/Results: DG Abd 1 View  Result Date: 10/04/2020 CLINICAL DATA:  Lack of bowel movement EXAM: ABDOMEN - 1 VIEW COMPARISON:  02/27/2020 FINDINGS: Prominent stool throughout the colon favors constipation. Thoracic and lumbar spondylosis. Likely chronic suspected lumbar compression fractures at L2, L4, and L5. Poor definition of the left hemidiaphragm, cannot exclude left basilar airspace opacity, consider chest radiography for further workup. Questionable blunting of the right lateral costophrenic angle. IMPRESSION: 1. Prominent stool throughout the colon favors constipation. 2. Poor definition of the left hemidiaphragm, cannot exclude left basilar airspace opacity, consider chest radiography for further workup. 3. Possible small right pleural effusion. 4. Likely chronic lumbar compression fractures at L2, L4, and L5. Electronically Signed   By: Van Clines M.D.   On: 10/04/2020 17:48   CT Head Wo Contrast  Result Date: 10/04/2020 CLINICAL DATA:  Abdominal distension with altered mental status EXAM: CT HEAD WITHOUT CONTRAST TECHNIQUE: Contiguous axial images were obtained from the base of the skull through the vertex without intravenous contrast. COMPARISON:  CT head 02/27/2020 FINDINGS: Brain: Periventricular white matter and corona radiata hypodensities favor chronic ischemic microvascular white matter disease. Otherwise, the brainstem, cerebellum, cerebral peduncles, thalamus, basal ganglia, basilar cisterns, and ventricular system appear within normal limits. No intracranial hemorrhage, mass lesion, or acute CVA. Vascular: There is atherosclerotic calcification of the cavernous carotid arteries bilaterally. Skull:  Unremarkable Sinuses/Orbits: Mild chronic ethmoid sinusitis. Other: No supplemental non-categorized findings. IMPRESSION: 1. No acute intracranial findings. 2. Periventricular white matter and corona radiata hypodensities favor chronic ischemic microvascular white matter  disease. 3. Mild chronic ethmoid sinusitis. Electronically Signed   By: Van Clines M.D.   On: 10/04/2020 22:08   CT ABDOMEN PELVIS W CONTRAST  Result Date: 10/04/2020 CLINICAL DATA:  Abdominal distention and altered mental status, constipation. EXAM: CT ABDOMEN AND PELVIS WITH CONTRAST TECHNIQUE: Multidetector CT imaging of the abdomen and pelvis was performed using the standard protocol following bolus administration of intravenous contrast. CONTRAST:  170mL OMNIPAQUE IOHEXOL 300 MG/ML  SOLN COMPARISON:  Abdomen radiograph 02/27/2020 FINDINGS: Despite efforts by the technologist and patient, motion artifact is present on today's exam and could not be eliminated. This reduces exam sensitivity and specificity. Lower chest: Large bilateral pleural effusions with associated passive atelectasis. These effusions are nonspecific for transudative or exudative etiology. Left anterior descending and right coronary artery atherosclerosis along with descending thoracic aortic atherosclerosis. Small pericardial effusion. Hepatobiliary: Masslike process with marginal enhancement and central hypoenhancement/central necrosis centered in the left hepatic lobe, measuring 14.2 by 10.3 by 9.9 cm on image 51 series 5. Possibilities include malignancy or abscess. Linear lucency inferiorly in segment 3 of the liver could be from a prominent intrahepatic bile duct or possibly portal vein thrombosis of the associated portal vein tributary. The main portal vein is patent. Gallbladder grossly unremarkable, somewhat blurred by motion artifact. Pancreas: Unremarkable Spleen: Unremarkable. Adrenals/Urinary Tract: Both adrenal glands appear normal. Simple appearing cysts of the right kidney. Urinary bladder unremarkable. Stomach/Bowel: Prominent stool throughout much of the colon, but not in the sigmoid colon or rectum. It is difficult to identify the site of transition due to the motion artifact in today's exam. However, it is  probably just distal to the splenic flexure. Borderline rectal wall thickening, without surrounding adenopathy. Vascular/Lymphatic: Aortoiliac atherosclerotic vascular disease. Reproductive: Mild prostatomegaly. Other: Mild diffuse subcutaneous and mesenteric edema. Musculoskeletal: Remote compression fractures at L2, L4, and L5. Lumbar spondylosis and degenerative disc disease causing multilevel impingement. IMPRESSION: 1. Masslike process with marginal enhancement and central necrosis centered in the left hepatic lobe, measuring 14.2 by 10.3 by 9.9 cm (volume = 760 cm^3). Possibilities include malignancy or abscess. Correlate with 2. Linear lucency inferiorly in segment 3 of the liver could be from a dilated intrahepatic bile duct or possibly portal vein thrombosis of the associated portal vein tributary. 3. Large bilateral pleural effusions with associated passive atelectasis. These effusions are nonspecific for transudative or exudative etiology. 4. Prominent stool throughout much of the colon, but not in the sigmoid colon or rectum. Borderline rectal wall thickening, without surrounding adenopathy. 5. Other imaging findings of potential clinical significance: Coronary atherosclerosis. Small pericardial effusion. Remote compression fractures at L2, L4, and L5. Lumbar spondylosis and degenerative disc disease causing multilevel impingement. Mild prostatomegaly. 6. Despite efforts by the technologist and patient, motion artifact is present on today's exam and could not be eliminated. This reduces exam sensitivity and specificity. 7. Aortic atherosclerosis. Aortic Atherosclerosis (ICD10-I70.0). Electronically Signed   By: Van Clines M.D.   On: 10/04/2020 22:05   DG Chest Port 1 View  Result Date: 10/04/2020 CLINICAL DATA:  Weakness EXAM: PORTABLE CHEST 1 VIEW COMPARISON:  10/20/2015 FINDINGS: Bilateral layering pleural effusions. Bilateral lower lobe airspace opacities. Heart is normal size. Aortic  atherosclerosis. No acute bony abnormality. IMPRESSION: Bilateral layering effusions with bibasilar atelectasis or infiltrates. Electronically Signed   By: Rolm Baptise  M.D.   On: 10/04/2020 19:45    Impression: 87 year old gentleman admitted to the hospital with progressive decline in mental status, history of progressive supranuclear palsy, subdural hematoma, hyponatremia and now fairly acute onset of bowel and urinary bladder dysfunction. No obstruction apparent on CT.  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   Of note, serum sodiums have looked pretty good in the outpatient setting over the past couple of months as noted above. Attempted colonic purge yesterday did not work.  Hepatic lesion needs identification  Recommendations:  Abdomen decompression prior to asking IR to sample liver lesion.  Lets go ahead and get a Gastrografin enema tomorrow morning for both therapeutic and diagnostic purpose (ideally, contrast will emulsify stool and get it moving and further document patency of the lower GI tract).  Of course, continue to strive for normalization of serum sodium.  Realistically, plan for IR consultation/liver lesion sampling on 3/22.  I have discussed the plan with patient's daughter, Lendon Ka, and Dr. Wynetta Emery.

## 2020-10-06 NOTE — Progress Notes (Signed)
Done

## 2020-10-07 ENCOUNTER — Inpatient Hospital Stay (HOSPITAL_COMMUNITY): Payer: Medicare PPO

## 2020-10-07 ENCOUNTER — Encounter (HOSPITAL_COMMUNITY): Payer: Self-pay | Admitting: Internal Medicine

## 2020-10-07 DIAGNOSIS — Z7189 Other specified counseling: Secondary | ICD-10-CM

## 2020-10-07 DIAGNOSIS — G231 Progressive supranuclear ophthalmoplegia [Steele-Richardson-Olszewski]: Secondary | ICD-10-CM

## 2020-10-07 DIAGNOSIS — K5900 Constipation, unspecified: Secondary | ICD-10-CM | POA: Diagnosis not present

## 2020-10-07 DIAGNOSIS — I10 Essential (primary) hypertension: Secondary | ICD-10-CM | POA: Diagnosis not present

## 2020-10-07 DIAGNOSIS — F0390 Unspecified dementia without behavioral disturbance: Secondary | ICD-10-CM | POA: Diagnosis not present

## 2020-10-07 DIAGNOSIS — R269 Unspecified abnormalities of gait and mobility: Secondary | ICD-10-CM | POA: Diagnosis not present

## 2020-10-07 DIAGNOSIS — E871 Hypo-osmolality and hyponatremia: Secondary | ICD-10-CM | POA: Diagnosis not present

## 2020-10-07 DIAGNOSIS — J90 Pleural effusion, not elsewhere classified: Secondary | ICD-10-CM | POA: Diagnosis not present

## 2020-10-07 DIAGNOSIS — Z515 Encounter for palliative care: Secondary | ICD-10-CM

## 2020-10-07 DIAGNOSIS — G301 Alzheimer's disease with late onset: Secondary | ICD-10-CM | POA: Diagnosis not present

## 2020-10-07 LAB — BASIC METABOLIC PANEL
Anion gap: 9 (ref 5–15)
BUN: 19 mg/dL (ref 8–23)
CO2: 21 mmol/L — ABNORMAL LOW (ref 22–32)
Calcium: 8.5 mg/dL — ABNORMAL LOW (ref 8.9–10.3)
Chloride: 97 mmol/L — ABNORMAL LOW (ref 98–111)
Creatinine, Ser: 0.74 mg/dL (ref 0.61–1.24)
GFR, Estimated: >60 mL/min (ref >60)
Glucose, Bld: 89 mg/dL (ref 70–99)
Potassium: 4.3 mmol/L (ref 3.5–5.1)
Sodium: 127 mmol/L — ABNORMAL LOW (ref 135–145)

## 2020-10-07 LAB — CBC WITH DIFFERENTIAL/PLATELET
Abs Immature Granulocytes: 0.16 10*3/uL — ABNORMAL HIGH (ref 0.00–0.07)
Basophils Absolute: 0 10*3/uL (ref 0.0–0.1)
Basophils Relative: 0 %
Eosinophils Absolute: 0.1 10*3/uL (ref 0.0–0.5)
Eosinophils Relative: 0 %
HCT: 34.7 % — ABNORMAL LOW (ref 39.0–52.0)
Hemoglobin: 11.1 g/dL — ABNORMAL LOW (ref 13.0–17.0)
Immature Granulocytes: 1 %
Lymphocytes Relative: 6 %
Lymphs Abs: 0.6 10*3/uL — ABNORMAL LOW (ref 0.7–4.0)
MCH: 31 pg (ref 26.0–34.0)
MCHC: 32 g/dL (ref 30.0–36.0)
MCV: 96.9 fL (ref 80.0–100.0)
Monocytes Absolute: 1 10*3/uL (ref 0.1–1.0)
Monocytes Relative: 9 %
Neutro Abs: 9.3 10*3/uL — ABNORMAL HIGH (ref 1.7–7.7)
Neutrophils Relative %: 84 %
Platelets: 339 10*3/uL (ref 150–400)
RBC: 3.58 MIL/uL — ABNORMAL LOW (ref 4.22–5.81)
RDW: 12.7 % (ref 11.5–15.5)
WBC: 11.2 10*3/uL — ABNORMAL HIGH (ref 4.0–10.5)
nRBC: 0 % (ref 0.0–0.2)

## 2020-10-07 LAB — BODY FLUID CELL COUNT WITH DIFFERENTIAL
Eos, Fluid: 0 %
Lymphs, Fluid: 19 %
Monocyte-Macrophage-Serous Fluid: 25 % — ABNORMAL LOW (ref 50–90)
Neutrophil Count, Fluid: 56 % — ABNORMAL HIGH (ref 0–25)
Other Cells, Fluid: REACTIVE %
Total Nucleated Cell Count, Fluid: 646 cu mm (ref 0–1000)

## 2020-10-07 LAB — MAGNESIUM: Magnesium: 2 mg/dL (ref 1.7–2.4)

## 2020-10-07 LAB — PROTEIN, PLEURAL OR PERITONEAL FLUID: Total protein, fluid: 3.8 g/dL

## 2020-10-07 LAB — GRAM STAIN: Gram Stain: NONE SEEN

## 2020-10-07 LAB — GLUCOSE, PLEURAL OR PERITONEAL FLUID: Glucose, Fluid: 80 mg/dL

## 2020-10-07 LAB — ALBUMIN, PLEURAL OR PERITONEAL FLUID: Albumin, Fluid: 2.2 g/dL

## 2020-10-07 MED ORDER — DIATRIZOATE MEGLUMINE & SODIUM 66-10 % PO SOLN
ORAL | Status: AC
  Filled 2020-10-07: qty 150

## 2020-10-07 MED ORDER — LINACLOTIDE 145 MCG PO CAPS
290.0000 ug | ORAL_CAPSULE | Freq: Every day | ORAL | Status: DC
  Administered 2020-10-07 – 2020-10-11 (×4): 290 ug via ORAL
  Filled 2020-10-07 (×4): qty 2

## 2020-10-07 MED ORDER — ACETAMINOPHEN 650 MG RE SUPP: 650.0000 mg | Freq: Four times a day (QID) | RECTAL | Status: DC | PRN

## 2020-10-07 MED ORDER — ACETAMINOPHEN 325 MG PO TABS
650.0000 mg | ORAL_TABLET | Freq: Four times a day (QID) | ORAL | Status: DC | PRN
  Administered 2020-10-07 – 2020-10-08 (×2): 650 mg via ORAL
  Filled 2020-10-07 (×2): qty 2

## 2020-10-07 MED ORDER — DIATRIZOATE MEGLUMINE & SODIUM 66-10 % PO SOLN
1350.0000 mL | Freq: Once | ORAL | Status: AC
  Administered 2020-10-07: 1350 mL

## 2020-10-07 MED ORDER — HEPARIN SODIUM (PORCINE) 5000 UNIT/ML IJ SOLN
5000.0000 [IU] | Freq: Three times a day (TID) | INTRAMUSCULAR | Status: DC
  Administered 2020-10-09 – 2020-10-11 (×8): 5000 [IU] via SUBCUTANEOUS
  Filled 2020-10-07 (×8): qty 1

## 2020-10-07 MED ORDER — ACETAMINOPHEN 325 MG PO TABS: 650.0000 mg | ORAL_TABLET | Freq: Four times a day (QID) | ORAL | Status: DC | PRN

## 2020-10-07 NOTE — Progress Notes (Signed)
PROGRESS NOTE   Luis Freeman  YQM:[id number] DOB: 1932/08/04 DOA: 10/04/2020 PCP: [name], MD   Chief Complaint  Patient presents with  . Weakness   Level of care: Telemetry  Brief Admission History:  87 y.o. male, with medical history significant fordementia, progressive supranuclear palsy, gait abnormality.  History of fall in the past with subdural hematoma, neurogenic bladder, hypertension, B12 deficiency. Patient was brought to ED by his daughter, due to concerns of difficulty passing urine, and severe constipation, patient is total care currently, he is having 24-hour with her family or paid caregiver, patient with progressive weakness over last few days, has been less interactive as well, with difficulty passing urine, and no good bowel movement for few days, he saw his urologist yesterday, for dark-colored urine, family were told no UTI, no acute findings, per report patient mental status has decreased, he is not as responsive as usual, patient with dementia, cannot provide any history, it was provided by daughter. - in ED patient had sodium of 121, usually it is in the upper 120s, CT abdomen pelvis significant for significant stool burden, and abnormal liver findings suspicious for malignancy versus abscess, [facility] hospitalist consulted to admit.  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 - Improving - Continue fluid restriction - Follow BMP  Bilateral large pleural effusion  - Discussed with family - s/p thoracentesis 10/07/20 with 1.5 L removed from right side  Newly found Liver Lesion  - Concerning for malignancy versus infection - Appreciate GI consultation, plan to pursue IR guided biopsy on 3/22 if able - Given his extreme frailty and advanced age, I have asked for a palliative care consultation  DNR  - Documents in  chart, continue DNR order in hospital  Dementia / Parkinson's  - stable, resumed home meds  Severe Constipation  - Appreciate GI consult and orders for gastrograffin enema on 3/21  Acute urinary retention  - Given severe frailty and family against starting Flomax would keep foley in for now   DVT prophylaxis: heparin Code Status: DNR  Family Communication: daughter at bedside Disposition: TBD Status is: Inpatient  Remains inpatient appropriate because:Ongoing diagnostic testing needed not appropriate for outpatient work up, IV treatments appropriate due to intensity of illness or inability to take PO and Inpatient level of care appropriate due to severity of illness  Dispo: The patient is from: Home              Anticipated d/c is to: Home              Patient currently is not medically stable to d/c.   Difficult to place patient No   Consultants:   GI hepatology  Procedures:   Thoracentesis 10/07/20  Tentative IR liver biopsy 10/08/20  Antimicrobials:  n/a  Subjective: Pt not able to communicate well  Objective: Vitals:   10/07/20 0517 10/07/20 0936 10/07/20 1000 10/07/20 1155  BP: 132/61 134/61 138/64 137/80  Pulse: 87 89 86 93  Resp: 18 18 18 16   Temp: 98.1 F (36.7 C) 98.1 F (36.7 C) 98.1 F (36.7 C) 98.3 F (36.8 C)  TempSrc: Oral Oral Oral Axillary  SpO2: 93% 93% 95%   Weight:      Height:        Intake/Output Summary (Last 24 hours) at 10/07/2020 1245 Last data filed at  10/07/2020 9622 Gross per 24 hour  Intake --  Output 450 ml  Net -450 ml   Filed Weights   10/04/20 1907 10/04/20 2314  Weight: 68.5 kg 68.5 kg   Examination:  General exam: extremely frail, elderly, mostly nonverbal male, with warm hands, soft grip, Appears calm and comfortable  Respiratory system: diminished breath sounds in both bases. Respiratory effort normal. Cardiovascular system: normal S1 & S2 heard. No JVD, murmurs, rubs, gallops or clicks. No pedal  edema. Gastrointestinal system: Abdomen is distended, soft. No organomegaly or masses felt. Normal bowel sounds heard. Central nervous system: somnolent, No focal neurological deficits. Extremities: warm with good peripheral pulses palpated. Skin: No gross rashes, lesions or ulcers Psychiatry: Judgement and insight unable to determine. Mood & affect seems appropriate but difficult to determine.   Data Reviewed: I have personally reviewed following labs and imaging studies  CBC: Recent Labs  Lab 10/04/20 2017 10/05/20 0535 10/06/20 0451 10/07/20 0441  WBC 12.0* 11.0* 13.9* 11.2*  NEUTROABS 10.3*  --  12.4* 9.3*  HGB 12.9* 11.6* 12.3* 11.1*  HCT 38.8* 35.6* 37.6* 34.7*  MCV 96.0 97.0 96.2 96.9  PLT 324 293 356 297    Basic Metabolic Panel: Recent Labs  Lab 10/04/20 2017 10/05/20 0535 10/06/20 0451 10/07/20 0441  NA 121* 125* 127* 127*  K 4.2 3.7 4.3 4.3  CL 90* 96* 96* 97*  CO2 22 20* 21* 21*  GLUCOSE 104* 93 102* 89  BUN 18 14 17 19   CREATININE 0.77 0.61 0.68 0.74  CALCIUM 8.5* 8.4* 8.6* 8.5*  MG  --   --  2.0 2.0    GFR: Estimated Creatinine Clearance: 65.4 mL/min (by C-G formula based on SCr of 0.74 mg/dL).  Liver Function Tests: Recent Labs  Lab 10/04/20 2017 10/05/20 0535  AST 29 21  ALT 8 7  ALKPHOS 134* 113  BILITOT 0.6 0.6  PROT 7.1 6.0*  ALBUMIN 3.5 2.9*    CBG: Recent Labs  Lab 10/06/20 0738  GLUCAP 106*    Recent Results (from the past 240 hour(s))  SARS CORONAVIRUS 2 (TAT 6-24 HRS) Nasopharyngeal Nasopharyngeal Swab     Status: None   Collection Time: 10/04/20 10:00 PM   Specimen: Nasopharyngeal Swab  Result Value Ref Range Status   SARS Coronavirus 2 NEGATIVE NEGATIVE Final    Comment: (NOTE) SARS-CoV-2 target nucleic acids are NOT DETECTED.  The SARS-CoV-2 RNA is generally detectable in upper and lower respiratory specimens during the acute phase of infection. Negative results do not preclude SARS-CoV-2 infection, do not rule  out co-infections with other pathogens, and should not be used as the sole basis for treatment or other patient management decisions. Negative results must be combined with clinical observations, patient history, and epidemiological information. The expected result is Negative.  Fact Sheet for Patients: SugarRoll.be  Fact Sheet for Healthcare Providers: https://www.woods-mathews.com/  This test is not yet approved or cleared by the [place] FDA and  has been authorized for detection and/or diagnosis of SARS-CoV-2 by FDA under an Emergency Use Authorization (EUA). This EUA will remain  in effect (meaning this test can be used) for the duration of the COVID-19 declaration under Se ction 564(b)(1) of the Act, 21 U.S.C. section 360bbb-3(b)(1), unless the authorization is terminated or revoked sooner.  Performed at [facility], [place] [address], [place], [place] [address]   Urine Culture     Status: None   Collection Time: 10/04/20 10:41 PM   Specimen: Urine, Clean Catch  Result Value Ref Range Status   Specimen Description   Final    URINE, CLEAN CATCH Performed at [facility], [address], [place], [place] [address]    Special Requests   Final    NONE Performed at [facility], [address], [place], [place] [address]    Culture   Final    NO GROWTH Performed at [facility], [place] [address] [place], [place] [address]    Report Status 10/06/2020 FINAL  Final  Gram stain     Status: None   Collection Time: 10/07/20  9:45 AM   Specimen: Pleura  Result Value Ref Range Status   Specimen Description PLEURAL  Final   Special Requests NONE  Final   Gram Stain   Final    NO ORGANISMS SEEN WBC PRESENT,BOTH PMN AND MONONUCLEAR Performed at [facility], [address], [place], [place] [address]    Report Status 10/07/2020 FINAL  Final     Radiology Studies: DG Chest 1 View  Result Date: 10/07/2020 CLINICAL DATA:   Status post right-sided thoracentesis EXAM: CHEST  1 VIEW COMPARISON:  1 day prior earlier today at 5:02 a.m. FINDINGS: Midline trachea. Mild cardiomegaly. Layering small left pleural effusion is unchanged. Significant improvement to resolution in right-sided pleural fluid. Skin fold over the chest bilaterally, but no pneumothorax. Underlying mild interstitial edema. Left base airspace disease persists. IMPRESSION: Improved resolved right-sided pleural effusion, without pneumothorax. Persistent left pleural effusion, adjacent atelectasis, and interstitial edema. Electronically Signed   By: Abigail Miyamoto M.D.   On: 10/07/2020 10:20   DG CHEST PORT 1 VIEW  Result Date: 10/07/2020 CLINICAL DATA:  Pleural effusion EXAM: PORTABLE CHEST 1 VIEW COMPARISON:  October 04, 2020 FINDINGS: Pleural effusions again noted bilaterally, layering. There is persistent airspace opacity in the medial left base. Bilateral apical pleural thickening is stable. No new opacity. Heart is upper normal in size with pulmonary vascularity normal. No adenopathy. There is aortic atherosclerosis. Bones are osteoporotic. IMPRESSION: Layering pleural effusions bilaterally. Airspace opacity left lower lobe which may represent atelectasis with potential underlying pneumonia. No new opacity. Stable cardiac silhouette. Aortic Atherosclerosis (ICD10-I70.0). Electronically Signed   By: Lowella Grip III M.D.   On: 10/07/2020 07:59   DG BE (COLON)W SINGLE CM (SOL OR THIN BA)  Result Date: 10/07/2020 CLINICAL DATA:  Constipation with lack of bowel movements for 8 days. Hepatic mass on CT. EXAM: WATER SOLUBLE CONTRAST ENEMA TECHNIQUE: Single-contrast non prepped barium enema was performed for both therapeutic and attempted diagnostic purposes. FLUOROSCOPY TIME:  Fluoroscopy Time:  3 minutes and 30 seconds Radiation Exposure Index (if provided by the fluoroscopic device): 4 Number of Acquired Spot Images: Not applicable. COMPARISON:  Abdominopelvic CT  of 10/04/2020. FINDINGS: Preprocedure scout film demonstrates a large amount of colonic stool. No bowel obstruction. Focused, limited single-contrast exam performed with the patient in a left lateral and supine position. Limitations secondary to patient's age and clinical status. The rectum and sigmoid are grossly normal, without circumferential narrowing. From the sigmoid proximally, the colon is stool filled. Contrast filled to approximately the level of the mid transverse colon. IMPRESSION: 1. Focused therapeutic and less so diagnostic non prepped barium enema performed. 2. No evidence of circumferential or obstructive mass, especially within the rectum or sigmoid. Electronically Signed   By: Abigail Miyamoto M.D.   On: 10/07/2020 11:46   US THORACENTESIS ASP PLEURAL SPACE W/IMG GUIDE  Result Date: 10/07/2020 INDICATION: Bilateral pleural fluid. EXAM: ULTRASOUND GUIDED RIGHT filled  THORACENTESIS MEDICATIONS: None. COMPLICATIONS: None immediate. PROCEDURE: An ultrasound guided thoracentesis was thoroughly discussed with the patient and the patient's daughter and questions answered. The benefits, risks, alternatives and complications were also discussed. The patient understands and wishes to proceed with the procedure. Written consent was obtained. Ultrasound was performed to localize and mark an adequate pocket of fluid in the right chest. The area was then prepped and draped in the normal sterile fashion. 1% Lidocaine was used for local anesthesia. Under ultrasound guidance a 19 gauge, 7-cm, Yueh catheter was introduced. Thoracentesis was performed. The catheter was removed and a dressing applied. FINDINGS: A total of approximately 1.2 of yellow fluid was removed. Samples were sent to the laboratory as requested by the clinical team. IMPRESSION: Successful ultrasound guided right thoracentesis yielding 1.2 L of pleural fluid. Electronically Signed   By: Abigail Miyamoto M.D.   On: 10/07/2020 10:22    Scheduled  Meds: . amLODipine  10 mg Oral Daily  . benazepril  40 mg Oral q AM  . bisacodyl  10 mg Rectal Q1200  . carbidopa-levodopa  1.5 tablet Oral TID  . Chlorhexidine Gluconate Cloth  6 each Topical Daily  . cholecalciferol  1,000 Units Oral q AM  . diatrizoate meglumine-sodium      . diatrizoate meglumine-sodium      . heparin  5,000 Units Subcutaneous Q8H  . hydrALAZINE  25 mg Oral TID  . memantine  10 mg Oral Q lunch   Continuous Infusions:   LOS: 3 days   Time spent: 35 mins   Lazariah Savard Wynetta Emery, MD How to contact the [facility] Attending or Consulting provider [place] or covering provider during after hours [place], for this patient?  1. Check the care team in [facility] and look for a) attending/consulting [facility] provider listed and b) the [facility] team listed 2. Log into [email or web address] and use [place]'s universal password to access. If you do not have the password, please contact the hospital operator. 3. Locate the [facility] provider you are looking for under [facility] and page to a number that you can be directly reached. 4. If you still have difficulty reaching the provider, please page the [facility] (Director on Call) for the Hospitalists listed on amion for assistance.  10/07/2020, 12:45 PM

## 2020-10-07 NOTE — Sedation Documentation (Signed)
PT tolerated right sided thoracentesis procedure well today and 1.2 Liters of slightly cloudy yellow fluid removed with labs collected and sent for processing. Daughter present in radiology and updated post procedure by radiologist. PT taken to xray department for post chest xray at this time. Daughter verbalized understanding of post procedure instructions and pt vital signs stable with NAD noted.

## 2020-10-07 NOTE — Progress Notes (Signed)
Receptionist made arrangements with [facility] for pt to be at [facility] interventional radiology tomorrow morning at 9 am for liver biopsy. Medical necessity transfer form completed by this nurse and placed in packet for pick up tomorrow. This will be a round trip, [facility] notified of this as well. [name] daughter has requested to ride along with pt to decrease his anxiety, receptionist asked [facility] this and they said this will be up to the drivers tomorrow weather or not they allow her to ride or not. [facility] verified transportation.

## 2020-10-07 NOTE — Evaluation (Signed)
Clinical/Bedside Swallow Evaluation Patient Details  Name: Luis Freeman MRN: [id number] Date of Birth: Oct 24, 1932  Today's Date: 10/07/2020 Time: SLP Start Time (ACUTE ONLY): 1300 SLP Stop Time (ACUTE ONLY): 1328 SLP Time Calculation (min) (ACUTE ONLY): 28 min  Past Medical History:  Past Medical History:  Diagnosis Date  . Chronic low back pain   . Dementia ([place])   . Gait abnormality 11/15/2019  . Gait disorder   . Hypertension   . PSP (progressive supranuclear palsy) ([place]) 11/15/2019  . Vitamin B12 deficiency    Past Surgical History:  Past Surgical History:  Procedure Laterality Date  . CATARACT EXTRACTION, BILATERAL    . COLONOSCOPY    . COLONOSCOPY  02/04/2011   Procedure: COLONOSCOPY;  Surgeon: Daneil Dolin, MD;  Location: [facility];  Service: Endoscopy;  Laterality: N/A;   HPI:  87 y.o. male, with medical history significant for dementia, progressive supranuclear palsy, gait abnormality.  History of fall in the past with subdural hematoma, neurogenic bladder, hypertension, B12 deficiency. Patient was brought to ED by his daughter, due to concerns of difficulty passing urine, and severe constipation, patient is total care currently, he is having 24-hour with her family or paid caregiver, patient with progressive weakness over last few days, has been less interactive as well, with difficulty passing urine, and no good bowel movement for few days, he saw his urologist yesterday, for dark-colored urine, family were told no UTI, no acute findings, per report patient mental status has decreased, he is not as responsive as usual, patient with dementia, cannot provide any history, it was provided by daughter. In the ED patient had sodium of 121, usually it is in the upper 120s, CT abdomen pelvis significant for significant stool burden, and abnormal liver findings suspicious for malignancy versus abscess. GI following. BSE requested. Pt known to SLP service in outpatient setting for  dysarthria. Pt is typically on a regular diet with thin liquids.   MBSS at [facility] 07/02/2020: <<Assessment: Patient presents with normal oropharyngeal swallow c/b timely trigger of swallow, adequate apposition of base of tongue to posterior pharyngeal wall, and adequate pharyngeal constriction. This was evidenced by no residue in the vallecular space and pyriform sinuses. No penetration or aspiration observed on today's exam. Consistencies tested: thin liquid, puree, cracker solid. Of note, impulsivity seen with solid cracker trials characterized by multiple bites prior to swallow. Discussed impulsivity in the setting of PSP as something to anticipate/be cognizant of as potential contributor to dysphagia as neurological diagnosis progresses.>>    Assessment / Plan / Recommendation Clinical Impression  Pt known to this SLP from previous outpatient therapy for dysarthria. He had an MBSS at [facility] a few months ago and was cleared for regular textures and thin liquids. Pt with reduced alertness and vocalizations during my evaluation today. Typically, he can answer questions after a delay (some echolalia), however today his vocalizations were limited today. Pt unable to follow directions during oral motor examination, however noted to move oral structures WNL during po consumption. Pt without overt signs or symptoms of aspiration during po intake of ice chips, thin water via cup/straw, puree, and regular textures. Ok to continue diet as ordered with standard aspiration and reflux precautions. SLP will sign off. PO medications whole with water.  SLP Visit Diagnosis: Dysphagia, unspecified (R13.10)    Aspiration Risk  Mild aspiration risk    Diet Recommendation Regular;Thin liquid   Liquid Administration via: Cup;Straw Medication Administration: Whole meds with liquid Supervision: Patient able to  self feed;Intermittent supervision to cue for compensatory strategies Compensations: Small  sips/bites Postural Changes: Remain upright for at least 30 minutes after po intake;Seated upright at 90 degrees    Other  Recommendations Oral Care Recommendations: Oral care BID;Staff/trained caregiver to provide oral care Other Recommendations: Clarify dietary restrictions   Follow up Recommendations 24 hour supervision/assistance      Frequency and Duration            Prognosis Prognosis for Safe Diet Advancement: Good      Swallow Study   General Date of Onset: 10/04/20 HPI: 87 y.o. male, with medical history significant for dementia, progressive supranuclear palsy, gait abnormality.  History of fall in the past with subdural hematoma, neurogenic bladder, hypertension, B12 deficiency. Patient was brought to ED by his daughter, due to concerns of difficulty passing urine, and severe constipation, patient is total care currently, he is having 24-hour with her family or paid caregiver, patient with progressive weakness over last few days, has been less interactive as well, with difficulty passing urine, and no good bowel movement for few days, he saw his urologist yesterday, for dark-colored urine, family were told no UTI, no acute findings, per report patient mental status has decreased, he is not as responsive as usual, patient with dementia, cannot provide any history, it was provided by daughter. In the ED patient had sodium of 121, usually it is in the upper 120s, CT abdomen pelvis significant for significant stool burden, and abnormal liver findings suspicious for malignancy versus abscess. GI following. BSE requested. Pt known to SLP service in outpatient setting for dysarthria. Pt is typically on a regular diet with thin liquids. Type of Study: Bedside Swallow Evaluation Previous Swallow Assessment: MBSS at [facility] reg/thin Diet Prior to this Study: Regular;Thin liquids Temperature Spikes Noted: No Respiratory Status: Room air History of Recent Intubation: No Behavior/Cognition:  Lethargic/Drowsy;Alert;Requires cueing Oral Cavity Assessment: Within Functional Limits Oral Care Completed by SLP: Recent completion by staff Oral Cavity - Dentition: Adequate natural dentition Vision: Functional for self-feeding Self-Feeding Abilities: Needs set up Patient Positioning: Upright in bed Baseline Vocal Quality: Normal Volitional Cough: Cognitively unable to elicit Volitional Swallow: Unable to elicit    Oral/Motor/Sensory Function Overall Oral Motor/Sensory Function: Within functional limits (Pt unable to follow directions for oral motor examination, but WFL during functional tasks)   Ice Chips Ice chips: Within functional limits Presentation: Spoon   Thin Liquid Thin Liquid: Within functional limits Presentation: Cup;Self Fed;Straw    Nectar Thick Nectar Thick Liquid: Not tested   Honey Thick Honey Thick Liquid: Not tested   Puree Puree: Within functional limits Presentation: Spoon   Solid     Solid: Within functional limits Presentation: Self Fed     Thank you,  Genene Churn, [place]  PORTER,DABNEY 10/07/2020,2:43 PM

## 2020-10-07 NOTE — Progress Notes (Signed)
   Pt is scheduled for liver lesion biopsy vs abscess aspiration/drain placement in IR tomorrow at [facility]. Dr Anselm Pancoast has reviewed images and approves procedure  IMPRESSION: 1. Masslike process with marginal enhancement and central necrosis centered in the left hepatic lobe, measuring 14.2 by 10.3 by 9.9 cm (volume = 760 cm^3). Possibilities include malignancy or abscess. Correlate with 2. Linear lucency inferiorly in segment 3 of the liver could be from a dilated intrahepatic bile duct or possibly portal vein thrombosis of the associated portal vein tributary.  Pt to be at [facility] at 9 am via ambulance (RN to arrange). Orders in place RN aware

## 2020-10-07 NOTE — Progress Notes (Signed)
Starting Linzess 290 mcg daily.  Jannifer Franklin, PA-C, is aware of need for liver biopsy and reviewing images. Orders placed for US liver biopsy. Further recommendations to follow.

## 2020-10-07 NOTE — Evaluation (Signed)
Physical Therapy Evaluation Patient Details Name: Luis Freeman MRN: [id number] DOB: September 02, 1932 Today's Date: 10/07/2020   History of Present Illness  Luis Freeman  is a 87 y.o. male, with medical history significant for dementia, progressive supranuclear palsy, gait abnormality.  History of fall in the past with subdural hematoma, neurogenic bladder, hypertension, B12 deficiency.  -Patient was brought to ED by his daughter, due to concerns of difficulty passing urine, and severe constipation, patient is total care currently, he is having 24-hour with her family or paid caregiver, patient with progressive weakness over last few days, has been less interactive as well, with difficulty passing urine, and no good bowel movement for few days, he saw his urologist yesterday, for dark-colored urine, family were told no UTI, no acute findings, per report patient mental status has decreased, he is not as responsive as usual, patient with dementia, cannot provide any history, it was provided by daughter.  - in ED patient had sodium of 121, usually it is in the upper 120s, CT abdomen pelvis significant for significant stool burden, and abnormal liver findings suspicious for malignancy versus abscess, [facility] hospitalist consulted to admit.    Clinical Impression  Patient requires Max verbal/tactile cueing to follow instructions, initially upon sitting patient fell backwards, but able to maintain sitting balance after a few minutes, tends to lean backwards when attempting sit to stands requiring verbal/tactile cueing for safety with fair carryover, at high risk for falls and limited to a few shuffling side steps to transfer to chair.  Patient will benefit from continued physical therapy in hospital and recommended venue below to increase strength, balance, endurance for safe ADLs and gait.    Follow Up Recommendations SNF    Equipment Recommendations  None recommended by PT    Recommendations for Other Services        Precautions / Restrictions Precautions Precautions: Fall Restrictions Weight Bearing Restrictions: No      Mobility  Bed Mobility Overal bed mobility: Needs Assistance Bed Mobility: Supine to Sit     Supine to sit: Mod assist     General bed mobility comments: increased time, labored movement    Transfers Overall transfer level: Needs assistance Equipment used: Rolling walker (2 wheeled) Transfers: Sit to/from [facility] Sit to Stand: Mod assist;Max assist Stand pivot transfers: Mod assist;Max assist       General transfer comment: very unsteady, tends to lean backwards during sit to stands  Ambulation/Gait Ambulation/Gait assistance: Max assist Gait Distance (Feet): 4 Feet Assistive device: Rolling walker (2 wheeled) Gait Pattern/deviations: Decreased step length - right;Decreased step length - left;Decreased stride length;Shuffle Gait velocity: decreased   General Gait Details: limited to 4-5 shuffling side steps with tendency to lean backwards, very usteady, at high risk for falls  Stairs            Wheelchair Mobility    Modified Rankin (Stroke Patients Only)       Balance Overall balance assessment: Needs assistance Sitting-balance support: Feet supported;No upper extremity supported Sitting balance-Leahy Scale: Fair Sitting balance - Comments: occasional leaning backwards Postural control: Posterior lean Standing balance support: During functional activity;Bilateral upper extremity supported Standing balance-Leahy Scale: Poor Standing balance comment: using RW                             Pertinent Vitals/Pain Pain Assessment: No/denies pain    Home Living Family/patient expects to be discharged to:: Private residence Living Arrangements: Spouse/significant  other Available Help at Discharge: Family;Personal care attendant;Available 24 hours/day Type of Home: House Home Access: Ramped entrance      Home Layout: Multi-level;Able to live on main level with bedroom/bathroom;Laundry or work area in [place]: Kasandra Knudsen - single point;Shower seat - built in;Bedside commode;Walker - 4 wheels;Wheelchair - manual      Prior Function Level of Independence: Needs assistance   Gait / Transfers Assistance Needed: Supervised household Geologist, engineering  ADL's / Homemaking Assistance Needed: has family/home aides 24/7        Hand Dominance   Dominant Hand: Right    Extremity/Trunk Assessment   Upper Extremity Assessment Upper Extremity Assessment: Generalized weakness    Lower Extremity Assessment Lower Extremity Assessment: Generalized weakness    Cervical / Trunk Assessment Cervical / Trunk Assessment: Normal  Communication   Communication: HOH  Cognition Arousal/Alertness: Awake/alert Behavior During Therapy: Flat affect Overall Cognitive Status: History of cognitive impairments - at baseline                                        General Comments      Exercises     Assessment/Plan    PT Assessment Patient needs continued PT services  PT Problem List Decreased strength;Decreased activity tolerance;Decreased balance;Decreased mobility       PT Treatment Interventions DME instruction;Gait training;Stair training;Functional mobility training;Therapeutic activities;Therapeutic exercise;Patient/family education;Balance training    PT Goals (Current goals can be found in the Care Plan section)  Acute Rehab PT Goals Patient Stated Goal: return home with family to assist PT Goal Formulation: With patient/family Time For Goal Achievement: 10/21/20 Potential to Achieve Goals: Good    Frequency Min 3X/week   Barriers to discharge        Co-evaluation               AM-PAC PT "6 Clicks" Mobility  Outcome Measure Help needed turning from your back to your side while in a flat bed without using bedrails?: A Lot Help needed  moving from lying on your back to sitting on the side of a flat bed without using bedrails?: A Lot Help needed moving to and from a bed to a chair (including a wheelchair)?: A Lot Help needed standing up from a chair using your arms (e.g., wheelchair or bedside chair)?: A Lot Help needed to walk in hospital room?: A Lot Help needed climbing 3-5 steps with a railing? : Total 6 Click Score: 11    End of Session   Activity Tolerance: Patient tolerated treatment well;Patient limited by fatigue Patient left: in chair;with call bell/phone within reach;with family/visitor present Nurse Communication: Mobility status PT Visit Diagnosis: Unsteadiness on feet (R26.81);Other abnormalities of gait and mobility (R26.89);Muscle weakness (generalized) (M62.81)    Time: 3734-2876 PT Time Calculation (min) (ACUTE ONLY): 28 min   Charges:   PT Evaluation $PT Eval Moderate Complexity: 1 Mod PT Treatments $Therapeutic Activity: 23-37 mins        3:45 PM, 10/07/20 Lonell Grandchild, MPT Physical Therapist with [facility] [phone] office [phone] mobile phone

## 2020-10-07 NOTE — Progress Notes (Addendum)
Appropriate Use Committee Chart Review  Chart reviewed by the physician advisor with input from [facility] and the attending physician as needed for review of the appropriateness for SNF referral.  TOC notes, PT/OT/ST notes, nursing notes and physician notes reviewed for medical necessity to determine if the patient's needs are appropriate for short-term rehab to return to a prior level of function versus the likely need for custodial care.  At this time, the patient meets Medicare criteria for SNF placemen, however he is an 87 year old male with a history of dementia, progressive supranuclear palsy, gait abnormality, Parkinson's, admitted with hyponatremia, large pleural effusion and newly found liver lesion.  Currently pending biopsy.  Palliative care also consulted.  Patient does appear to have good family support at home.  Do not feel that patient is appropriate for SNF placement at this time.   Cristal Ford, DO Physician Advisor  10/07/2020 4:45 PM

## 2020-10-07 NOTE — Plan of Care (Signed)
  Problem: Acute Rehab PT Goals(only PT should resolve) Goal: Pt Will Go Supine/Side To Sit Outcome: Progressing Flowsheets (Taken 10/07/2020 1546) Pt will go Supine/Side to Sit:  with minimal assist  with moderate assist Goal: Patient Will Transfer Sit To/From Stand Outcome: Progressing Flowsheets (Taken 10/07/2020 1546) Patient will transfer sit to/from stand:  with moderate assist  with minimal assist Goal: Pt Will Transfer Bed To Chair/Chair To Bed Outcome: Progressing Flowsheets (Taken 10/07/2020 1546) Pt will Transfer Bed to Chair/Chair to Bed: with mod assist Goal: Pt Will Ambulate Outcome: Progressing Flowsheets (Taken 10/07/2020 1546) Pt will Ambulate:  15 feet  with moderate assist  with rolling walker   3:47 PM, 10/07/20 Lonell Grandchild, MPT Physical Therapist with [facility] [phone] office [phone] mobile phone

## 2020-10-07 NOTE — Consult Note (Addendum)
                                                                                 Consultation Note Date: 10/07/2020   Patient Name: Luis Freeman  DOB: 05/15/1933  MRN: [id number]  Age / Sex: 87 y.o., male  PCP: Fagan, Roy, MD Referring Physician: Johnson, Clanford L, MD  Reason for Consultation: Establishing goals of care  HPI/Patient Profile: 87 y.o. male  with past medical history of progressive supranuclear palsy, dementia, gait abnormality, neurogenic bladder, HTN, subdural hematoma, B12 deficiency admitted on 10/04/2020 with difficulty passing urine and severe constipation. Family reports patient with progressive weakness over last few days and dark-colored urine. Baseline, lives at home with 24/7 caregivers. Hospital admission with hyponatremia concerning for SIADH, improving. Bilateral large pleural effusion s/p thoracentesis 3/21 with 1.5L removed on right side. New found liver lesion--malignancy vs. Infection . GI following with plans for IR liver biopsy on 3/22. GI ordered gastrograffin enema on 3/21. Currently requiring foley catheter for acute urinary retention. Palliative medicine consultation for goals of care.   Clinical Assessment and Goals of Care:  I have reviewed medical records, discussed with care team, and met with patient and daughter ([name]) at bedside. Patient is awake, alert, and feeding himself dysphagia diet without concern for aspiration. Per daughter, he is cognitively at baseline.   I introduced Palliative Medicine as specialized medical care for people living with serious illness. It focuses on providing relief from the symptoms and stress of a serious illness. The goal is to improve quality of life for both the patient and the family.  We discussed a brief life review of the patient. Married >50 years. Four daughters. Patient was diagnosed with supranuclear palsy in January 2021. Prior to admission, living at home with wife and 24/7 caregiver support from  daughters or private caregivers. Patient requires assist with all ADL's. Functionally, he does ambulate with a rollator walker.   Discussed course of hospitalization including plan for IR liver biopsy tomorrow 3/22. If possible, if is very important if one of daughters can transfer to [place] with [name] in ambulance in order to help keep him calm and assist with communication. Reassured daughter the team will advocate for this.   Patient's ACP documentation reviewed in EMR. He has previously completed MOST form from 9/21 with wishes for DNR/DNI, NO feeding tube but hospitalization if necessary, IVF/ABX if indicated. He does have a documented living will with wife ([name]) as primary HCPOA and daughter ([name]) as secondary HCPOA. [name] confirms DNR code status. We discussed concept of treating the treatable.   Discussed watchful waiting and palliative follow-up once liver biopsy results in order to help with next steps/options following biopsy results. [name] understands and agrees with this plan.   Hard Choices and PMT contact information given. Encouraged [name] to call this NP with questions or concerns during PMT call hours (7a-5p).    SUMMARY OF RECOMMENDATIONS    Continue current plan of care and medical management.  Patient's AD/HCPOA and MOST form in ACP tab.  Plan for IR liver biopsy 3/22.   Continue PT/OT/SLP efforts.  PMT will continue to follow and further discuss goals   of care with patient/family once biopsy has resulted.   May benefit from outpatient palliative services for intermittent support to patient and family with a life-limiting condition. Excellent, supportive family who provides 24/7 home care.   Code Status/Advance Care Planning:  DNR: confirmed by daughter, [name] at bedside.  Patient has a documented MOST form and durable DNR in ACP tab from 9/21. Reviewed. NO feeding tube.  Patient has a documented living will in ACP tab. Reviewed.   Symptom  Management:   Per attending  Palliative Prophylaxis:   Aspiration, Bowel Regimen, Delirium Protocol, Oral Care and Turn Reposition   Psycho-social/Spiritual:   Desire for further Chaplaincy support: yes  Additional Recommendations: Caregiving  Support/Resources  Prognosis:   Unable to determine  Discharge Planning: To Be Determined: likely back home with caregiver support      Primary Diagnoses: Present on Admission: . Acute on Chronic HypoNatremia . Dementia ([place]) . HTN (hypertension) . Movement disorder   I have reviewed the medical record, interviewed the patient and family, and examined the patient. The following aspects are pertinent.  Past Medical History:  Diagnosis Date  . Chronic low back pain   . Dementia ([place])   . Gait abnormality 11/15/2019  . Gait disorder   . Hypertension   . PSP (progressive supranuclear palsy) ([place]) 11/15/2019  . Vitamin B12 deficiency    Social History   Socioeconomic History  . Marital status: Married    Spouse name: Not on file  . Number of children: Not on file  . Years of education: Not on file  . Highest education level: Not on file  Occupational History  . Not on file  Tobacco Use  . Smoking status: Never Smoker  . Smokeless tobacco: Never Used  Vaping Use  . Vaping Use: Never used  Substance and Sexual Activity  . Alcohol use: Not Currently    Alcohol/week: 0.0 standard drinks  . Drug use: No  . Sexual activity: Not on file  Other Topics Concern  . Not on file  Social History Narrative   Lives with wife and caregivers 24/7   Right handed   Drinks 1-2 cups caffeine daily   Social Determinants of Health   Financial Resource Strain: Not on file  Food Insecurity: Not on file  Transportation Needs: Not on file  Physical Activity: Not on file  Stress: Not on file  Social Connections: Not on file   Family History  Problem Relation Age of Onset  . Diabetes Mother   . Cancer Father   . Cancer - Lung  Brother    Scheduled Meds: . amLODipine  10 mg Oral Daily  . benazepril  40 mg Oral q AM  . bisacodyl  10 mg Rectal Q1200  . carbidopa-levodopa  1.5 tablet Oral TID  . Chlorhexidine Gluconate Cloth  6 each Topical Daily  . cholecalciferol  1,000 Units Oral q AM  . diatrizoate meglumine-sodium      . diatrizoate meglumine-sodium      . heparin  5,000 Units Subcutaneous Q8H  . hydrALAZINE  25 mg Oral TID  . memantine  10 mg Oral Q lunch   Continuous Infusions: PRN Meds:.ondansetron (ZOFRAN) IV Medications Prior to Admission:  Prior to Admission medications   Medication Sig Start Date End Date Taking? Authorizing Provider  acetaminophen (TYLENOL) 325 MG tablet Take 2 tablets (650 mg total) by mouth every 6 (six) hours as needed for mild pain (or Fever >/= 101). 02/19/20  Yes Roxan Hockey, MD  amLODipine (NORVASC) 10 MG tablet Take 1 tablet (10 mg total) by mouth daily. For BP 02/20/20  Yes Emokpae, Courage, MD  benazepril (LOTENSIN) 40 MG tablet Take 1 tablet (40 mg total) by mouth in the morning. 02/19/20  Yes Emokpae, Courage, MD  carbidopa-levodopa (SINEMET IR) 25-100 MG tablet Take 1.5 tablets by mouth 3 (three) times daily. 08/19/20  Yes Kathrynn Ducking, MD  cholecalciferol (VITAMIN D) 1000 units tablet Take 1,000 Units by mouth in the morning.    Yes [provider]  cyanocobalamin (,VITAMIN B-12,) 1000 MCG/ML injection Inject 1,000 mcg into the muscle every 30 (thirty) days.  08/02/15  Yes [provider]  hydrALAZINE (APRESOLINE) 25 MG tablet Take 1.5 tablets (37.5 mg total) by mouth 2 (two) times daily. 02/19/20  Yes Emokpae, Courage, MD  memantine (NAMENDA) 10 MG tablet Take 10 mg by mouth daily with lunch. 08/08/19  Yes [provider]  MYRBETRIQ 50 MG TB24 tablet Take 50 mg by mouth daily. 05/06/20  Yes [provider]  phenazopyridine (PYRIDIUM) 95 MG tablet Take 95 mg by mouth 3 (three) times daily as needed for pain.   Yes [provider]  Polyvinyl Alcohol-Povidone PF 1.4-0.6 % SOLN Apply 1 drop to eye daily as needed (FOR DRY EYE RELIEF).    Yes [provider]  simethicone (MYLICON) 010 MG chewable tablet Chew 125 mg by mouth every 6 (six) hours as needed for flatulence.   Yes [provider]  amLODipine (NORVASC) 5 MG tablet Take 5 mg by mouth daily. Patient not taking: Reported on 10/04/2020 09/19/20   [provider]  Vibegron (GEMTESA) 75 MG TABS Take by mouth. Patient not taking: No sig reported    [provider]   No Known Allergies Review of Systems  Unable to perform ROS: Other   Physical Exam Vitals and nursing note reviewed.  Constitutional:      General: He is awake.  HENT:     Head: Normocephalic and atraumatic.  Pulmonary:     Effort: No tachypnea, accessory muscle usage or respiratory distress.  Skin:    General: Skin is warm and dry.  Neurological:     Mental Status: He is alert.     Comments: Awake, alert    Vital Signs: BP 137/80 (BP Location: Left Arm)   Pulse 93   Temp 98.3 F (36.8 C) (Axillary)   Resp 16   Ht 5' 11.5" (1.816 m)   Wt 68.5 kg   SpO2 95%   BMI 20.77 kg/m  Pain Scale: 0-10 POSS *See Group Information*: 2-Acceptable,Slightly drowsy, easily aroused Pain Score: 0-No pain   SpO2: SpO2: 95 % O2 Device:SpO2: 95 % O2 Flow Rate: .   IO: Intake/output summary:   Intake/Output Summary (Last 24 hours) at 10/07/2020 1247 Last data filed at 10/07/2020 2725 Gross per 24 hour  Intake -  Output 450 ml  Net -450 ml    LBM: Last BM Date: 09/29/20 Baseline Weight: Weight: 68.5 kg Most recent weight: Weight: 68.5 kg     Palliative Assessment/Data: PPS 50%      Time Total: 40 Greater than 50%  of this time was spent counseling and coordinating care related to the above assessment and plan.  Signed by:  Ihor Dow, DNP, FNP-C Palliative Medicine Team  Phone: [phone] Fax: [phone]   Please contact Palliative Medicine  Team phone at [phone] for questions and concerns.  For individual provider: See Shea Evans

## 2020-10-07 NOTE — Progress Notes (Addendum)
Subjective: Just returned from radiology. Daughter, [name], at bedside providing majority of information. Just had 2 BMs after gastrografin enema. Patient sleeping without distress. Daughter concerned about transportation for liver biopsy and requesting that she be able to ride along due to patient's anxiety and keeping him calm.   Objective: Vital signs in last 24 hours: Temp:  [98.1 F (36.7 C)-98.4 F (36.9 C)] 98.1 F (36.7 C) (03/21 1000) Pulse Rate:  [86-90] 86 (03/21 1000) Resp:  [16-18] 18 (03/21 1000) BP: (132-152)/(61-72) 138/64 (03/21 1000) SpO2:  [93 %-95 %] 95 % (03/21 1000) Last BM Date: 09/29/20 General:   Resting with eyes closed. Opens eyes to verbal stimuli but falls asleep. Unable to answer questions but no distress.  Head:  Normocephalic and atraumatic. Abdomen:  Bowel sounds present, distended but soft. No TTP, no rebound or guarding Extremities:  Without  edema. Neurologic:  Unable to assess   Intake/Output from previous day: 03/20 0701 - 03/21 0700 In: 50 [P.O.:50] Out: 450 [Urine:450] Intake/Output this shift: No intake/output data recorded.  Lab Results: Recent Labs    10/05/20 0535 10/06/20 0451 10/07/20 0441  WBC 11.0* 13.9* 11.2*  HGB 11.6* 12.3* 11.1*  HCT 35.6* 37.6* 34.7*  PLT 293 356 339   BMET Recent Labs    10/05/20 0535 10/06/20 0451 10/07/20 0441  NA 125* 127* 127*  K 3.7 4.3 4.3  CL 96* 96* 97*  CO2 20* 21* 21*  GLUCOSE 93 102* 89  BUN 14 17 19   CREATININE 0.61 0.68 0.74  CALCIUM 8.4* 8.6* 8.5*   LFT Recent Labs    10/04/20 2017 10/05/20 0535  PROT 7.1 6.0*  ALBUMIN 3.5 2.9*  AST 29 21  ALT 8 7  ALKPHOS 134* 113  BILITOT 0.6 0.6    Studies/Results: DG CHEST PORT 1 VIEW  Result Date: 10/07/2020 CLINICAL DATA:  Pleural effusion EXAM: PORTABLE CHEST 1 VIEW COMPARISON:  October 04, 2020 FINDINGS: Pleural effusions again noted bilaterally, layering. There is persistent airspace opacity in the medial left base.  Bilateral apical pleural thickening is stable. No new opacity. Heart is upper normal in size with pulmonary vascularity normal. No adenopathy. There is aortic atherosclerosis. Bones are osteoporotic. IMPRESSION: Layering pleural effusions bilaterally. Airspace opacity left lower lobe which may represent atelectasis with potential underlying pneumonia. No new opacity. Stable cardiac silhouette. Aortic Atherosclerosis (ICD10-I70.0). Electronically Signed   By: Lowella Grip III M.D.   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 Gastrografin enema completed this morning for diagnostic and therapeutic purposes, with 2 small bowel movements thus far after returning to the floor. Rectum and sigmoid grossly normal, and from the sigmoid proximally, the colon is stool-filled.   Liver lesion: incidentally noticed on CT. Concern for neoplasm, unable to rule out abscess. HFP normal. IR consultation to be placed to arrange biopsy. Daughter requesting to ride with patient due to restlessness and dementia.   Bilateral pleural effusions: thoracentesis completed today. Fluid analysis pending.   Hyponatremia: improved from admission. Remains at 127 for past 24 hours.  Agree with palliative care consultation.      Plan: IR consultation placed for liver biopsy Daughter desires to ride with patient at time of transport if possible: will try to facilitate this Will add Linzess  to 90 mcg once daily to his regimen. Check AFP Further recommendations to follow  Annitta Needs, PhD, ANP-BC [facility]     LOS: 3 days    10/07/2020, 10:21 AM

## 2020-10-08 ENCOUNTER — Encounter (HOSPITAL_COMMUNITY): Payer: Self-pay

## 2020-10-08 ENCOUNTER — Ambulatory Visit (HOSPITAL_COMMUNITY)
Admit: 2020-10-08 | Discharge: 2020-10-08 | Disposition: A | Discharge status: Home / Self Care | Payer: Medicare PPO | Source: Ambulatory Visit | Attending: Gastroenterology | Admitting: Gastroenterology

## 2020-10-08 DIAGNOSIS — E871 Hypo-osmolality and hyponatremia: Secondary | ICD-10-CM | POA: Diagnosis not present

## 2020-10-08 DIAGNOSIS — F0390 Unspecified dementia without behavioral disturbance: Secondary | ICD-10-CM

## 2020-10-08 DIAGNOSIS — C228 Malignant neoplasm of liver, primary, unspecified as to type: Secondary | ICD-10-CM | POA: Insufficient documentation

## 2020-10-08 DIAGNOSIS — J90 Pleural effusion, not elsewhere classified: Secondary | ICD-10-CM

## 2020-10-08 DIAGNOSIS — K769 Liver disease, unspecified: Secondary | ICD-10-CM | POA: Insufficient documentation

## 2020-10-08 DIAGNOSIS — G231 Progressive supranuclear ophthalmoplegia [Steele-Richardson-Olszewski]: Secondary | ICD-10-CM | POA: Insufficient documentation

## 2020-10-08 DIAGNOSIS — K5900 Constipation, unspecified: Secondary | ICD-10-CM | POA: Diagnosis not present

## 2020-10-08 DIAGNOSIS — R160 Hepatomegaly, not elsewhere classified: Secondary | ICD-10-CM | POA: Insufficient documentation

## 2020-10-08 DIAGNOSIS — Z9889 Other specified postprocedural states: Secondary | ICD-10-CM

## 2020-10-08 LAB — CBC WITH DIFFERENTIAL/PLATELET
Abs Immature Granulocytes: 0.23 10*3/uL — ABNORMAL HIGH (ref 0.00–0.07)
Basophils Absolute: 0 10*3/uL (ref 0.0–0.1)
Basophils Relative: 0 %
Eosinophils Absolute: 0.1 10*3/uL (ref 0.0–0.5)
Eosinophils Relative: 1 %
HCT: 34 % — ABNORMAL LOW (ref 39.0–52.0)
Hemoglobin: 11.2 g/dL — ABNORMAL LOW (ref 13.0–17.0)
Immature Granulocytes: 2 %
Lymphocytes Relative: 5 %
Lymphs Abs: 0.6 10*3/uL — ABNORMAL LOW (ref 0.7–4.0)
MCH: 31.9 pg (ref 26.0–34.0)
MCHC: 32.9 g/dL (ref 30.0–36.0)
MCV: 96.9 fL (ref 80.0–100.0)
Monocytes Absolute: 0.9 10*3/uL (ref 0.1–1.0)
Monocytes Relative: 9 %
Neutro Abs: 9.2 10*3/uL — ABNORMAL HIGH (ref 1.7–7.7)
Neutrophils Relative %: 83 %
Platelets: 340 10*3/uL (ref 150–400)
RBC: 3.51 MIL/uL — ABNORMAL LOW (ref 4.22–5.81)
RDW: 12.9 % (ref 11.5–15.5)
WBC: 11 10*3/uL — ABNORMAL HIGH (ref 4.0–10.5)
nRBC: 0 % (ref 0.0–0.2)

## 2020-10-08 LAB — AMYLASE, BODY FLUID (OTHER): Amylase, Body Fluid: 11 U/L

## 2020-10-08 LAB — BASIC METABOLIC PANEL
Anion gap: 10 (ref 5–15)
BUN: 19 mg/dL (ref 8–23)
CO2: 22 mmol/L (ref 22–32)
Calcium: 8.3 mg/dL — ABNORMAL LOW (ref 8.9–10.3)
Chloride: 97 mmol/L — ABNORMAL LOW (ref 98–111)
Creatinine, Ser: 0.68 mg/dL (ref 0.61–1.24)
GFR, Estimated: >60 mL/min (ref >60)
Glucose, Bld: 87 mg/dL (ref 70–99)
Potassium: 3.9 mmol/L (ref 3.5–5.1)
Sodium: 129 mmol/L — ABNORMAL LOW (ref 135–145)

## 2020-10-08 LAB — AFP TUMOR MARKER: AFP, Serum, Tumor Marker: 3.1 ng/mL (ref 0.0–6.4)

## 2020-10-08 LAB — MAGNESIUM: Magnesium: 2 mg/dL (ref 1.7–2.4)

## 2020-10-08 LAB — PROTIME-INR
INR: 1.1 (ref 0.8–1.2)
Prothrombin Time: 13.5 seconds (ref 11.4–15.2)

## 2020-10-08 MED ORDER — GELATIN ABSORBABLE 12-7 MM EX MISC
CUTANEOUS | Status: AC
  Filled 2020-10-08: qty 1

## 2020-10-08 MED ORDER — FENTANYL CITRATE (PF) 100 MCG/2ML IJ SOLN
INTRAMUSCULAR | Status: AC
  Filled 2020-10-08: qty 2

## 2020-10-08 MED ORDER — LIDOCAINE HCL (PF) 1 % IJ SOLN
INTRAMUSCULAR | Status: AC
  Filled 2020-10-08: qty 30

## 2020-10-08 MED ORDER — MIDAZOLAM HCL 2 MG/2ML IJ SOLN
INTRAMUSCULAR | Status: AC
  Filled 2020-10-08: qty 2

## 2020-10-08 NOTE — Progress Notes (Signed)
PROGRESS NOTE   Luis Freeman  AVW:[id number] DOB: 13-Dec-1932 DOA: 10/04/2020 PCP: [name], MD   Chief Complaint  Patient presents with  . Weakness   Level of care: Telemetry  Brief Admission History:  87 y.o. male, with medical history significant fordementia, progressive supranuclear palsy, gait abnormality.  History of fall in the past with subdural hematoma, neurogenic bladder, hypertension, B12 deficiency. Patient was brought to ED by his daughter, due to concerns of difficulty passing urine, and severe constipation, patient is total care currently, he is having 24-hour with her family or paid caregiver, patient with progressive weakness over last few days, has been less interactive as well, with difficulty passing urine, and no good bowel movement for few days, he saw his urologist yesterday, for dark-colored urine, family were told no UTI, no acute findings, per report patient mental status has decreased, he is not as responsive as usual, patient with dementia, cannot provide any history, it was provided by daughter. - in ED patient had sodium of 121, usually it is in the upper 120s, CT abdomen pelvis significant for significant stool burden, and abnormal liver findings suspicious for malignancy versus abscess, [facility] hospitalist consulted to admit.  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 - Improving - Continue fluid restriction - Follow BMP  Bilateral large pleural effusion  - Discussed with family - s/p thoracentesis 10/07/20 with 1.5 L removed from right side  Newly found Liver Lesion (mass) - Concerning for malignancy versus infection - Appreciate GI consultation, s/p IR guided biopsy on 3/22 with biopsy results still pending - Given his extreme frailty and advanced age, I have asked for a  palliative care consultation to clarify goals of care  DNR  - Documents in chart, continue DNR order in hospital  Dementia / Parkinson's  - stable, resumed home meds  Severe Constipation  - Appreciate GI consult and orders for gastrograffin enema on 3/21 - Good results from gastrograffin enema and oral Linzess daily   Acute urinary retention  - Given severe frailty and family against starting Flomax due to severe orthostatic hypotension would keep foley in for now  - He has his own private urologist at [facility].  If fails voiding trial DC home with foley and follow up with his urologist.     DVT prophylaxis: heparin Code Status: DNR  Family Communication: daughter, wife and caretaker at bedside 3/22 Disposition: TBD Status is: Inpatient  Remains inpatient appropriate because:Ongoing diagnostic testing needed not appropriate for outpatient work up, IV treatments appropriate due to intensity of illness or inability to take PO and Inpatient level of care appropriate due to severity of illness  Dispo: The patient is from: Home              Anticipated d/c is to: Home              Patient currently is not medically stable to d/c.   Difficult to place patient No   Consultants:   GI hepatology  Procedures:   Thoracentesis 10/07/20  Tentative IR liver biopsy 10/08/20  Antimicrobials:  n/a  Subjective: Pt tolerated liver biopsy and now back and eating with family.   Objective: Vitals:   10/07/20 1351 10/07/20 2019 10/08/20 0519 10/08/20 1303  BP: (!) 117/53 (!) 112/52 106/60 139/61  Pulse: 92 82 81 88  Resp: 17 18 18 16   Temp:  97.9 F (36.6 C) 97.9 F (36.6 C) (!) 97.4 F (36.3 C)  TempSrc:   Oral Oral  SpO2: 95% 95% 95% 95%  Weight:      Height:        Intake/Output Summary (Last 24 hours) at 10/08/2020 1503 Last data filed at 10/08/2020 1300 Gross per 24 hour  Intake 480 ml  Output 2500 ml  Net -2020 ml   Filed Weights   10/04/20 1907 10/04/20 2314   Weight: 68.5 kg 68.5 kg   Examination:  General exam: extremely frail, elderly, mostly nonverbal male, sitting up in chair, with warm hands, soft grip, Appears calm and comfortable.   Respiratory system: diminished breath sounds in both bases. Respiratory effort normal. Cardiovascular system: normal S1 & S2 heard. No JVD, murmurs, rubs, gallops or clicks. No pedal edema. Gastrointestinal system: Abdomen is distended, soft. No organomegaly or masses felt. Normal bowel sounds heard. Central nervous system: somnolent, No focal neurological deficits. Extremities: warm with good peripheral pulses palpated. Skin: No gross rashes, lesions or ulcers Psychiatry: Judgement and insight unable to determine. Mood & affect seems appropriate but difficult to determine.   Data Reviewed: I have personally reviewed following labs and imaging studies  CBC: Recent Labs  Lab 10/04/20 2017 10/05/20 0535 10/06/20 0451 10/07/20 0441 10/08/20 0537  WBC 12.0* 11.0* 13.9* 11.2* 11.0*  NEUTROABS 10.3*  --  12.4* 9.3* 9.2*  HGB 12.9* 11.6* 12.3* 11.1* 11.2*  HCT 38.8* 35.6* 37.6* 34.7* 34.0*  MCV 96.0 97.0 96.2 96.9 96.9  PLT 324 293 356 339 166    Basic Metabolic Panel: Recent Labs  Lab 10/04/20 2017 10/05/20 0535 10/06/20 0451 10/07/20 0441 10/08/20 0537  NA 121* 125* 127* 127* 129*  K 4.2 3.7 4.3 4.3 3.9  CL 90* 96* 96* 97* 97*  CO2 22 20* 21* 21* 22  GLUCOSE 104* 93 102* 89 87  BUN 18 14 17 19 19   CREATININE 0.77 0.61 0.68 0.74 0.68  CALCIUM 8.5* 8.4* 8.6* 8.5* 8.3*  MG  --   --  2.0 2.0 2.0    GFR: Estimated Creatinine Clearance: 65.4 mL/min (by C-G formula based on SCr of 0.68 mg/dL).  Liver Function Tests: Recent Labs  Lab 10/04/20 2017 10/05/20 0535  AST 29 21  ALT 8 7  ALKPHOS 134* 113  BILITOT 0.6 0.6  PROT 7.1 6.0*  ALBUMIN 3.5 2.9*    CBG: Recent Labs  Lab 10/06/20 0738  GLUCAP 106*    Recent Results (from the past 240 hour(s))  SARS CORONAVIRUS 2 (TAT 6-24  HRS) Nasopharyngeal Nasopharyngeal Swab     Status: None   Collection Time: 10/04/20 10:00 PM   Specimen: Nasopharyngeal Swab  Result Value Ref Range Status   SARS Coronavirus 2 NEGATIVE NEGATIVE Final    Comment: (NOTE) SARS-CoV-2 target nucleic acids are NOT DETECTED.  The SARS-CoV-2 RNA is generally detectable in upper and lower respiratory specimens during the acute phase of infection. Negative results do not preclude SARS-CoV-2 infection, do not rule out co-infections with other pathogens, and should not be used as the sole basis for treatment or other patient management decisions. Negative results must be combined with clinical observations, patient history, and epidemiological information. The expected result is Negative.  Fact Sheet for Patients: SugarRoll.be  Fact Sheet for Healthcare Providers: https://www.woods-mathews.com/  This test is not yet approved or cleared by the [place] FDA and  has been  authorized for detection and/or diagnosis of SARS-CoV-2 by FDA under an Emergency Use Authorization (EUA). This EUA will remain  in effect (meaning this test can be used) for the duration of the COVID-19 declaration under Se ction 564(b)(1) of the Act, 21 U.S.C. section 360bbb-3(b)(1), unless the authorization is terminated or revoked sooner.  Performed at [facility], [place] [address], [place], [place] [address]   Urine Culture     Status: None   Collection Time: 10/04/20 10:41 PM   Specimen: Urine, Clean Catch  Result Value Ref Range Status   Specimen Description   Final    URINE, CLEAN CATCH Performed at [facility], [address], [place], [place] [address]    Special Requests   Final    NONE Performed at [facility], [address], [place], [place] [address]    Culture   Final    NO GROWTH Performed at [facility], [place] [address], [place], [place] [address]    Report Status 10/06/2020 FINAL  Final   Culture, body fluid w Gram Stain-bottle     Status: None (Preliminary result)   Collection Time: 10/07/20  9:45 AM   Specimen: Pleura  Result Value Ref Range Status   Specimen Description PLEURAL  Final   Special Requests 10CC  Final   Culture   Final    NO GROWTH < 24 HOURS Performed at [facility], [address] [place], [place] [address]    Report Status PENDING  Incomplete  Gram stain     Status: None   Collection Time: 10/07/20  9:45 AM   Specimen: Pleura  Result Value Ref Range Status   Specimen Description PLEURAL  Final   Special Requests NONE  Final   Gram Stain   Final    NO ORGANISMS SEEN WBC PRESENT,BOTH PMN AND MONONUCLEAR Performed at [facility], [address], [place],  [address]    Report Status 10/07/2020 FINAL  Final     Radiology Studies: DG Chest 1 View  Result Date: 10/07/2020 CLINICAL DATA:  Status post right-sided thoracentesis EXAM: CHEST  1 VIEW COMPARISON:  1 day prior earlier today at 5:02 a.m. FINDINGS: Midline trachea. Mild cardiomegaly. Layering small left pleural effusion is unchanged. Significant improvement to resolution in right-sided pleural fluid. Skin fold over the chest bilaterally, but no pneumothorax. Underlying mild interstitial edema. Left base airspace disease persists. IMPRESSION: Improved resolved right-sided pleural effusion, without pneumothorax. Persistent left pleural effusion, adjacent atelectasis, and interstitial edema. Electronically Signed   By: Abigail Miyamoto M.D.   On: 10/07/2020 10:20   US BIOPSY (LIVER)  Result Date: 10/08/2020 INDICATION: Indeterminate anterior large left liver mass EXAM: ULTRASOUND GUIDED CORE BIOPSY OF ANTERIOR LEFT LIVER MASS MEDICATIONS: 1% LIDOCAINE LOCAL ANESTHESIA/SEDATION: Moderate Sedation Time:  None. The patient was continuously monitored during the procedure by the interventional radiology nurse under my direct supervision. FLUOROSCOPY TIME:  Fluoroscopy Time: None. COMPLICATIONS: None  immediate. PROCEDURE: The procedure, risks, benefits, and alternatives were explained to the patient. Questions regarding the procedure were encouraged and answered. The patient understands and consents to the procedure. Previous imaging reviewed. Preliminary ultrasound performed. The large anterior left hepatic mass was localized from a subcostal anterior approach. Under sterile conditions and local anesthesia, a 17 gauge 6.8 cm access was advanced into the lesion. Needle position confirmed with ultrasound. 18 gauge core biopsies obtained. These were intact and non fragmented. Samples placed in formalin. Needle tract occluded with Gel-Foam. Postprocedure imaging demonstrates no hemorrhage  or hematoma. Patient tolerated biopsy well. FINDINGS: Imaging confirms needle placement in the anterior large left liver lesion for core biopsy IMPRESSION: Successful ultrasound anterior left hepatic mass 18 gauge core biopsy Electronically Signed   By: Jerilynn Mages.  Shick M.D.   On: 10/08/2020 11:44   DG CHEST PORT 1 VIEW  Result Date: 10/07/2020 CLINICAL DATA:  Pleural effusion EXAM: PORTABLE CHEST 1 VIEW COMPARISON:  October 04, 2020 FINDINGS: Pleural effusions again noted bilaterally, layering. There is persistent airspace opacity in the medial left base. Bilateral apical pleural thickening is stable. No new opacity. Heart is upper normal in size with pulmonary vascularity normal. No adenopathy. There is aortic atherosclerosis. Bones are osteoporotic. IMPRESSION: Layering pleural effusions bilaterally. Airspace opacity left lower lobe which may represent atelectasis with potential underlying pneumonia. No new opacity. Stable cardiac silhouette. Aortic Atherosclerosis (ICD10-I70.0). Electronically Signed   By: Lowella Grip III M.D.   On: 10/07/2020 07:59   DG BE (COLON)W SINGLE CM (SOL OR THIN BA)  Result Date: 10/07/2020 CLINICAL DATA:  Constipation with lack of bowel movements for 8 days. Hepatic mass on CT. EXAM: WATER  SOLUBLE CONTRAST ENEMA TECHNIQUE: Single-contrast non prepped barium enema was performed for both therapeutic and attempted diagnostic purposes. FLUOROSCOPY TIME:  Fluoroscopy Time:  3 minutes and 30 seconds Radiation Exposure Index (if provided by the fluoroscopic device): 4 Number of Acquired Spot Images: Not applicable. COMPARISON:  Abdominopelvic CT of 10/04/2020. FINDINGS: Preprocedure scout film demonstrates a large amount of colonic stool. No bowel obstruction. Focused, limited single-contrast exam performed with the patient in a left lateral and supine position. Limitations secondary to patient's age and clinical status. The rectum and sigmoid are grossly normal, without circumferential narrowing. From the sigmoid proximally, the colon is stool filled. Contrast filled to approximately the level of the mid transverse colon. IMPRESSION: 1. Focused therapeutic and less so diagnostic non prepped barium enema performed. 2. No evidence of circumferential or obstructive mass, especially within the rectum or sigmoid. Electronically Signed   By: Abigail Miyamoto M.D.   On: 10/07/2020 11:46   US THORACENTESIS ASP PLEURAL SPACE W/IMG GUIDE  Result Date: 10/07/2020 INDICATION: Bilateral pleural fluid. EXAM: ULTRASOUND GUIDED RIGHT filled THORACENTESIS MEDICATIONS: None. COMPLICATIONS: None immediate. PROCEDURE: An ultrasound guided thoracentesis was thoroughly discussed with the patient and the patient's daughter and questions answered. The benefits, risks, alternatives and complications were also discussed. The patient understands and wishes to proceed with the procedure. Written consent was obtained. Ultrasound was performed to localize and mark an adequate pocket of fluid in the right chest. The area was then prepped and draped in the normal sterile fashion. 1% Lidocaine was used for local anesthesia. Under ultrasound guidance a 19 gauge, 7-cm, Yueh catheter was introduced. Thoracentesis was performed. The catheter  was removed and a dressing applied. FINDINGS: A total of approximately 1.2 of yellow fluid was removed. Samples were sent to the laboratory as requested by the clinical team. IMPRESSION: Successful ultrasound guided right thoracentesis yielding 1.2 L of pleural fluid. Electronically Signed   By: Abigail Miyamoto M.D.   On: 10/07/2020 10:22    Scheduled Meds: . amLODipine  10 mg Oral Daily  . benazepril  40 mg Oral q AM  . bisacodyl  10 mg Rectal Q1200  . carbidopa-levodopa  1.5 tablet Oral TID  . Chlorhexidine Gluconate Cloth  6 each Topical Daily  . cholecalciferol  1,000 Units Oral q AM  . [START ON 10/09/2020] heparin  5,000 Units Subcutaneous Q8H  . hydrALAZINE  25 mg Oral TID  . linaclotide  290 mcg Oral QAC breakfast  . memantine  10 mg Oral Q lunch   Continuous Infusions:   LOS: 4 days   Time spent: 35 mins   Johnni Wunschel Wynetta Emery, MD How to contact the [facility] Attending or Consulting provider [place] or covering provider during after hours [place], for this patient?  1. Check the care team in [facility] and look for a) attending/consulting [facility] provider listed and b) the [facility] team listed 2. Log into [email or web address] and use [place]'s universal password to access. If you do not have the password, please contact the hospital operator. 3. Locate the [facility] provider you are looking for under [facility] and page to a number that you can be directly reached. 4. If you still have difficulty reaching the provider, please page the [facility] (Director on Call) for the Hospitalists listed on amion for assistance.  10/08/2020, 3:03 PM

## 2020-10-08 NOTE — Progress Notes (Signed)
Subjective: Care link is present getting patient ready for transfer. Resting comfortably. Unable to provide any history but eyes are open and he is alert. Spoke with his daughter. States he is more perky today. Had total of 4 BMs yesterday after enema and Linzess. Large in quantity and loose. No brbpr or melena. No nausea or vomiting.   Objective: Vital signs in last 24 hours: Temp:  [97.9 F (36.6 C)-98.3 F (36.8 C)] 97.9 F (36.6 C) (03/22 0519) Pulse Rate:  [81-93] 81 (03/22 0519) Resp:  [16-18] 18 (03/22 0519) BP: (106-138)/(52-80) 106/60 (03/22 0519) SpO2:  [93 %-95 %] 95 % (03/22 0519) Last BM Date: 10/06/20 General:  Alert. No acute distress. Resting comfortably in bed. Care link at bedside getting patient ready for transfer.   Head:  Normocephalic and atraumatic. Eyes: No icterus, sclera clear. Conjuctiva pink.  Abdomen:  Abdomen is distended but soft. Bowel sounds present. No appreciable TTP. No HSM or hernias noted. No rebound or guarding. No masses appreciated  Extremities:  Without edema. Neurologic:  Unable to assess.   Intake/Output from previous day: 03/21 0701 - 03/22 0700 In: 240 [P.O.:240] Out: 2100 [Urine:2100] Intake/Output this shift: No intake/output data recorded.  Lab Results: Recent Labs    10/06/20 0451 10/07/20 0441 10/08/20 0537  WBC 13.9* 11.2* 11.0*  HGB 12.3* 11.1* 11.2*  HCT 37.6* 34.7* 34.0*  PLT 356 339 340   BMET Recent Labs    10/06/20 0451 10/07/20 0441 10/08/20 0537  NA 127* 127* 129*  K 4.3 4.3 3.9  CL 96* 97* 97*  CO2 21* 21* 22  GLUCOSE 102* 89 87  BUN 17 19 19   CREATININE 0.68 0.74 0.68  CALCIUM 8.6* 8.5* 8.3*   LFT No results for input(s): PROT, ALBUMIN, AST, ALT, ALKPHOS, BILITOT, BILIDIR, IBILI in the last 72 hours. PT/INR Recent Labs    10/08/20 0537  LABPROT 13.5  INR 1.1   Studies/Results: DG Chest 1 View  Result Date: 10/07/2020 CLINICAL DATA:  Status post right-sided thoracentesis EXAM: CHEST  1  VIEW COMPARISON:  1 day prior earlier today at 5:02 a.m. FINDINGS: Midline trachea. Mild cardiomegaly. Layering small left pleural effusion is unchanged. Significant improvement to resolution in right-sided pleural fluid. Skin fold over the chest bilaterally, but no pneumothorax. Underlying mild interstitial edema. Left base airspace disease persists. IMPRESSION: Improved resolved right-sided pleural effusion, without pneumothorax. Persistent left pleural effusion, adjacent atelectasis, and interstitial edema. Electronically Signed   By: Abigail Miyamoto M.D.   On: 10/07/2020 10:20   DG CHEST PORT 1 VIEW  Result Date: 10/07/2020 CLINICAL DATA:  Pleural effusion EXAM: PORTABLE CHEST 1 VIEW COMPARISON:  October 04, 2020 FINDINGS: Pleural effusions again noted bilaterally, layering. There is persistent airspace opacity in the medial left base. Bilateral apical pleural thickening is stable. No new opacity. Heart is upper normal in size with pulmonary vascularity normal. No adenopathy. There is aortic atherosclerosis. Bones are osteoporotic. IMPRESSION: Layering pleural effusions bilaterally. Airspace opacity left lower lobe which may represent atelectasis with potential underlying pneumonia. No new opacity. Stable cardiac silhouette. Aortic Atherosclerosis (ICD10-I70.0). Electronically Signed   By: Lowella Grip III M.D.   On: 10/07/2020 07:59   DG BE (COLON)W SINGLE CM (SOL OR THIN BA)  Result Date: 10/07/2020 CLINICAL DATA:  Constipation with lack of bowel movements for 8 days. Hepatic mass on CT. EXAM: WATER SOLUBLE CONTRAST ENEMA TECHNIQUE: Single-contrast non prepped barium enema was performed for both therapeutic and attempted diagnostic purposes. FLUOROSCOPY TIME:  Fluoroscopy Time:  3 minutes and 30 seconds Radiation Exposure Index (if provided by the fluoroscopic device): 4 Number of Acquired Spot Images: Not applicable. COMPARISON:  Abdominopelvic CT of 10/04/2020. FINDINGS: Preprocedure scout film  demonstrates a large amount of colonic stool. No bowel obstruction. Focused, limited single-contrast exam performed with the patient in a left lateral and supine position. Limitations secondary to patient's age and clinical status. The rectum and sigmoid are grossly normal, without circumferential narrowing. From the sigmoid proximally, the colon is stool filled. Contrast filled to approximately the level of the mid transverse colon. IMPRESSION: 1. Focused therapeutic and less so diagnostic non prepped barium enema performed. 2. No evidence of circumferential or obstructive mass, especially within the rectum or sigmoid. Electronically Signed   By: Abigail Miyamoto M.D.   On: 10/07/2020 11:46   US THORACENTESIS ASP PLEURAL SPACE W/IMG GUIDE  Result Date: 10/07/2020 INDICATION: Bilateral pleural fluid. EXAM: ULTRASOUND GUIDED RIGHT filled THORACENTESIS MEDICATIONS: None. COMPLICATIONS: None immediate. PROCEDURE: An ultrasound guided thoracentesis was thoroughly discussed with the patient and the patient's daughter and questions answered. The benefits, risks, alternatives and complications were also discussed. The patient understands and wishes to proceed with the procedure. Written consent was obtained. Ultrasound was performed to localize and mark an adequate pocket of fluid in the right chest. The area was then prepped and draped in the normal sterile fashion. 1% Lidocaine was used for local anesthesia. Under ultrasound guidance a 19 gauge, 7-cm, Yueh catheter was introduced. Thoracentesis was performed. The catheter was removed and a dressing applied. FINDINGS: A total of approximately 1.2 of yellow fluid was removed. Samples were sent to the laboratory as requested by the clinical team. IMPRESSION: Successful ultrasound guided right thoracentesis yielding 1.2 L of pleural fluid. Electronically Signed   By: Abigail Miyamoto M.D.   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 Gastrografin enema completed 3/21 for diagnostic and therapeutic purposes. Rectum and sigmoid grossly normal, and from the sigmoid proximally, the colon is stool-filled.  He was started on Linzess 290 mcg yesterday afternoon and has had 4 large BMs since Gastrografin enema and Linzess. Abdomen continues to be distended but is soft with no appreciable TTP and bowel sounds are present. He will continue on Linzess.   Hyponatremia: Improved from admission. Na 129 today. Ongoing management per hospitalist.   Liver lesion: Incidentally noticed on CT. Concern for neoplasm, unable to rule out abscess. LFTs normal. AFP normal. Plans for liver biopsy vs abscess aspiration/drain placement today at [facility].   Palliative care is following with patient and plans to have further discussions after liver biopsy results.   Plan: 1.  Liver biopsy vs  abscess aspiration/drain placement today at [facility] as planned.  2.  Continue Linzess 290 mcg daily.  3.  Continue supportive measures.  4.  Agree with palliative involvement.  5.  We will continue to follow with you.    LOS: 4 days    10/08/2020, 7:38 AM   Aliene Altes, PA-C [facility]

## 2020-10-08 NOTE — Consult Note (Signed)
Chief Complaint: Patient was seen in consultation today for hepatic lesion biopsy vs abscess aspiration/drain placement at the request of Annitta Needs  Referring Physician(s): Annitta Needs  Supervising Physician: Daryll Brod  Patient Status: [facility] IP  History of Present Illness: Luis Freeman is a 87 y.o. male   Dementia; progressive supranuclear palsy Admitted to [facility] 10/04/20- AMS and decline; abd distension Hyponatremia Bladder and bowel dysfunction Incidentally found liver lesion LFTs wnl; AFP wnl  CT 10/04/20: IMPRESSION: 1. Masslike process with marginal enhancement and central necrosis centered in the left hepatic lobe, measuring 14.2 by 10.3 by 9.9 cm (volume = 760 cm^3). Possibilities include malignancy or abscess. Correlate with 2. Linear lucency inferiorly in segment 3 of the liver could be from a dilated intrahepatic bile duct or possibly portal vein thrombosis of the associated portal vein tributary. 3. Large bilateral pleural effusions with associated passive atelectasis. These effusions are nonspecific for transudative or exudative etiology. 4. Prominent stool throughout much of the colon, but not in the sigmoid colon or rectum. Borderline rectal wall thickening, without surrounding adenopathy.  GI consulted at [facility] Request for liver lesion biopsy Vs Liver abscess aspiration/drain  Dr Anselm Pancoast reviewed imaging and approves procedure    Past Medical History:  Diagnosis Date  . Chronic low back pain   . Dementia ([place])   . Gait abnormality 11/15/2019  . Gait disorder   . Hypertension   . PSP (progressive supranuclear palsy) ([place]) 11/15/2019  . Vitamin B12 deficiency     Past Surgical History:  Procedure Laterality Date  . CATARACT EXTRACTION, BILATERAL    . COLONOSCOPY    . COLONOSCOPY  02/04/2011   Procedure: COLONOSCOPY;  Surgeon: Daneil Dolin, MD;  Location: [facility] SUITE;  Service: Endoscopy;  Laterality: N/A;    Allergies: Patient has no  known allergies.  Medications: Prior to Admission medications   Medication Sig Start Date End Date Taking? Authorizing Provider  acetaminophen (TYLENOL) 325 MG tablet Take 2 tablets (650 mg total) by mouth every 6 (six) hours as needed for mild pain (or Fever >/= 101). 02/19/20   Emokpae, Courage, MD  amLODipine (NORVASC) 10 MG tablet Take 1 tablet (10 mg total) by mouth daily. For BP 02/20/20   Emokpae, Courage, MD  amLODipine (NORVASC) 5 MG tablet Take 5 mg by mouth daily. Patient not taking: Reported on 10/04/2020 09/19/20   [provider]  benazepril (LOTENSIN) 40 MG tablet Take 1 tablet (40 mg total) by mouth in the morning. 02/19/20   Roxan Hockey, MD  carbidopa-levodopa (SINEMET IR) 25-100 MG tablet Take 1.5 tablets by mouth 3 (three) times daily. 08/19/20   Kathrynn Ducking, MD  cholecalciferol (VITAMIN D) 1000 units tablet Take 1,000 Units by mouth in the morning.     [provider]  cyanocobalamin (,VITAMIN B-12,) 1000 MCG/ML injection Inject 1,000 mcg into the muscle every 30 (thirty) days.  08/02/15   [provider]  hydrALAZINE (APRESOLINE) 25 MG tablet Take 1.5 tablets (37.5 mg total) by mouth 2 (two) times daily. 02/19/20   Roxan Hockey, MD  memantine (NAMENDA) 10 MG tablet Take 10 mg by mouth daily with lunch. 08/08/19   [provider]  MYRBETRIQ 50 MG TB24 tablet Take 50 mg by mouth daily. 05/06/20   [provider]  phenazopyridine (PYRIDIUM) 95 MG tablet Take 95 mg by mouth 3 (three) times daily as needed for pain.    [provider]  Polyvinyl Alcohol-Povidone PF 1.4-0.6 % SOLN Apply 1  drop to eye daily as needed (FOR DRY EYE RELIEF).     [provider]  simethicone (MYLICON) 970 MG chewable tablet Chew 125 mg by mouth every 6 (six) hours as needed for flatulence.    [provider]  Vibegron (GEMTESA) 75 MG TABS Take by mouth. Patient not taking: No sig reported    [provider]     Family  History  Problem Relation Age of Onset  . Diabetes Mother   . Cancer Father   . Cancer - Lung Brother     Social History   Socioeconomic History  . Marital status: Married    Spouse name: Not on file  . Number of children: Not on file  . Years of education: Not on file  . Highest education level: Not on file  Occupational History  . Not on file  Tobacco Use  . Smoking status: Never Smoker  . Smokeless tobacco: Never Used  Vaping Use  . Vaping Use: Never used  Substance and Sexual Activity  . Alcohol use: Not Currently    Alcohol/week: 0.0 standard drinks  . Drug use: No  . Sexual activity: Not on file  Other Topics Concern  . Not on file  Social History Narrative   Lives with wife and caregivers 24/7   Right handed   Drinks 1-2 cups caffeine daily   Social Determinants of Health   Financial Resource Strain: Not on file  Food Insecurity: Not on file  Transportation Needs: Not on file  Physical Activity: Not on file  Stress: Not on file  Social Connections: Not on file     Review of Systems: A 12 point ROS discussed and pertinent positives are indicated in the HPI above.  All other systems are negative.    Vital Signs: There were no vitals taken for this visit.  Physical Exam Vitals reviewed.  HENT:     Mouth/Throat:     Mouth: Mucous membranes are moist.  Cardiovascular:     Rate and Rhythm: Normal rate and regular rhythm.     Heart sounds: Normal heart sounds.  Pulmonary:     Breath sounds: Normal breath sounds.  Abdominal:     Palpations: Abdomen is soft.     Tenderness: There is no abdominal tenderness.  Skin:    General: Skin is warm.  Neurological:     Mental Status: Mental status is at baseline.     Comments: Pt is up in bed; alert--- attempts to say name Dtr at bedside Does not follow commands   Psychiatric:     Comments: Dtr at bedside She has signed consent for procedure     Imaging: DG Chest 1 View  Result Date:  10/07/2020 CLINICAL DATA:  Status post right-sided thoracentesis EXAM: CHEST  1 VIEW COMPARISON:  1 day prior earlier today at 5:02 a.m. FINDINGS: Midline trachea. Mild cardiomegaly. Layering small left pleural effusion is unchanged. Significant improvement to resolution in right-sided pleural fluid. Skin fold over the chest bilaterally, but no pneumothorax. Underlying mild interstitial edema. Left base airspace disease persists. IMPRESSION: Improved resolved right-sided pleural effusion, without pneumothorax. Persistent left pleural effusion, adjacent atelectasis, and interstitial edema. Electronically Signed   By: Abigail Miyamoto M.D.   On: 10/07/2020 10:20   DG Abd 1 View  Result Date: 10/04/2020 CLINICAL DATA:  Lack of bowel movement EXAM: ABDOMEN - 1 VIEW COMPARISON:  02/27/2020 FINDINGS: Prominent stool throughout the colon favors constipation. Thoracic and lumbar spondylosis. Likely chronic suspected lumbar compression  fractures at L2, L4, and L5. Poor definition of the left hemidiaphragm, cannot exclude left basilar airspace opacity, consider chest radiography for further workup. Questionable blunting of the right lateral costophrenic angle. IMPRESSION: 1. Prominent stool throughout the colon favors constipation. 2. Poor definition of the left hemidiaphragm, cannot exclude left basilar airspace opacity, consider chest radiography for further workup. 3. Possible small right pleural effusion. 4. Likely chronic lumbar compression fractures at L2, L4, and L5. Electronically Signed   By: Van Clines M.D.   On: 10/04/2020 17:48   CT Head Wo Contrast  Result Date: 10/04/2020 CLINICAL DATA:  Abdominal distension with altered mental status EXAM: CT HEAD WITHOUT CONTRAST TECHNIQUE: Contiguous axial images were obtained from the base of the skull through the vertex without intravenous contrast. COMPARISON:  CT head 02/27/2020 FINDINGS: Brain: Periventricular white matter and corona radiata hypodensities  favor chronic ischemic microvascular white matter disease. Otherwise, the brainstem, cerebellum, cerebral peduncles, thalamus, basal ganglia, basilar cisterns, and ventricular system appear within normal limits. No intracranial hemorrhage, mass lesion, or acute CVA. Vascular: There is atherosclerotic calcification of the cavernous carotid arteries bilaterally. Skull: Unremarkable Sinuses/Orbits: Mild chronic ethmoid sinusitis. Other: No supplemental non-categorized findings. IMPRESSION: 1. No acute intracranial findings. 2. Periventricular white matter and corona radiata hypodensities favor chronic ischemic microvascular white matter disease. 3. Mild chronic ethmoid sinusitis. Electronically Signed   By: Van Clines M.D.   On: 10/04/2020 22:08   CT ABDOMEN PELVIS W CONTRAST  Result Date: 10/04/2020 CLINICAL DATA:  Abdominal distention and altered mental status, constipation. EXAM: CT ABDOMEN AND PELVIS WITH CONTRAST TECHNIQUE: Multidetector CT imaging of the abdomen and pelvis was performed using the standard protocol following bolus administration of intravenous contrast. CONTRAST:  157mL OMNIPAQUE IOHEXOL 300 MG/ML  SOLN COMPARISON:  Abdomen radiograph 02/27/2020 FINDINGS: Despite efforts by the technologist and patient, motion artifact is present on today's exam and could not be eliminated. This reduces exam sensitivity and specificity. Lower chest: Large bilateral pleural effusions with associated passive atelectasis. These effusions are nonspecific for transudative or exudative etiology. Left anterior descending and right coronary artery atherosclerosis along with descending thoracic aortic atherosclerosis. Small pericardial effusion. Hepatobiliary: Masslike process with marginal enhancement and central hypoenhancement/central necrosis centered in the left hepatic lobe, measuring 14.2 by 10.3 by 9.9 cm on image 51 series 5. Possibilities include malignancy or abscess. Linear lucency inferiorly in  segment 3 of the liver could be from a prominent intrahepatic bile duct or possibly portal vein thrombosis of the associated portal vein tributary. The main portal vein is patent. Gallbladder grossly unremarkable, somewhat blurred by motion artifact. Pancreas: Unremarkable Spleen: Unremarkable. Adrenals/Urinary Tract: Both adrenal glands appear normal. Simple appearing cysts of the right kidney. Urinary bladder unremarkable. Stomach/Bowel: Prominent stool throughout much of the colon, but not in the sigmoid colon or rectum. It is difficult to identify the site of transition due to the motion artifact in today's exam. However, it is probably just distal to the splenic flexure. Borderline rectal wall thickening, without surrounding adenopathy. Vascular/Lymphatic: Aortoiliac atherosclerotic vascular disease. Reproductive: Mild prostatomegaly. Other: Mild diffuse subcutaneous and mesenteric edema. Musculoskeletal: Remote compression fractures at L2, L4, and L5. Lumbar spondylosis and degenerative disc disease causing multilevel impingement. IMPRESSION: 1. Masslike process with marginal enhancement and central necrosis centered in the left hepatic lobe, measuring 14.2 by 10.3 by 9.9 cm (volume = 760 cm^3). Possibilities include malignancy or abscess. Correlate with 2. Linear lucency inferiorly in segment 3 of the liver could be from a dilated intrahepatic bile  duct or possibly portal vein thrombosis of the associated portal vein tributary. 3. Large bilateral pleural effusions with associated passive atelectasis. These effusions are nonspecific for transudative or exudative etiology. 4. Prominent stool throughout much of the colon, but not in the sigmoid colon or rectum. Borderline rectal wall thickening, without surrounding adenopathy. 5. Other imaging findings of potential clinical significance: Coronary atherosclerosis. Small pericardial effusion. Remote compression fractures at L2, L4, and L5. Lumbar spondylosis and  degenerative disc disease causing multilevel impingement. Mild prostatomegaly. 6. Despite efforts by the technologist and patient, motion artifact is present on today's exam and could not be eliminated. This reduces exam sensitivity and specificity. 7. Aortic atherosclerosis. Aortic Atherosclerosis (ICD10-I70.0). Electronically Signed   By: Van Clines M.D.   On: 10/04/2020 22:05   DG CHEST PORT 1 VIEW  Result Date: 10/07/2020 CLINICAL DATA:  Pleural effusion EXAM: PORTABLE CHEST 1 VIEW COMPARISON:  October 04, 2020 FINDINGS: Pleural effusions again noted bilaterally, layering. There is persistent airspace opacity in the medial left base. Bilateral apical pleural thickening is stable. No new opacity. Heart is upper normal in size with pulmonary vascularity normal. No adenopathy. There is aortic atherosclerosis. Bones are osteoporotic. IMPRESSION: Layering pleural effusions bilaterally. Airspace opacity left lower lobe which may represent atelectasis with potential underlying pneumonia. No new opacity. Stable cardiac silhouette. Aortic Atherosclerosis (ICD10-I70.0). Electronically Signed   By: Lowella Grip III M.D.   On: 10/07/2020 07:59   DG Chest Port 1 View  Result Date: 10/04/2020 CLINICAL DATA:  Weakness EXAM: PORTABLE CHEST 1 VIEW COMPARISON:  10/20/2015 FINDINGS: Bilateral layering pleural effusions. Bilateral lower lobe airspace opacities. Heart is normal size. Aortic atherosclerosis. No acute bony abnormality. IMPRESSION: Bilateral layering effusions with bibasilar atelectasis or infiltrates. Electronically Signed   By: Rolm Baptise M.D.   On: 10/04/2020 19:45   DG BE (COLON)W SINGLE CM (SOL OR THIN BA)  Result Date: 10/07/2020 CLINICAL DATA:  Constipation with lack of bowel movements for 8 days. Hepatic mass on CT. EXAM: WATER SOLUBLE CONTRAST ENEMA TECHNIQUE: Single-contrast non prepped barium enema was performed for both therapeutic and attempted diagnostic purposes. FLUOROSCOPY  TIME:  Fluoroscopy Time:  3 minutes and 30 seconds Radiation Exposure Index (if provided by the fluoroscopic device): 4 Number of Acquired Spot Images: Not applicable. COMPARISON:  Abdominopelvic CT of 10/04/2020. FINDINGS: Preprocedure scout film demonstrates a large amount of colonic stool. No bowel obstruction. Focused, limited single-contrast exam performed with the patient in a left lateral and supine position. Limitations secondary to patient's age and clinical status. The rectum and sigmoid are grossly normal, without circumferential narrowing. From the sigmoid proximally, the colon is stool filled. Contrast filled to approximately the level of the mid transverse colon. IMPRESSION: 1. Focused therapeutic and less so diagnostic non prepped barium enema performed. 2. No evidence of circumferential or obstructive mass, especially within the rectum or sigmoid. Electronically Signed   By: Abigail Miyamoto M.D.   On: 10/07/2020 11:46   US THORACENTESIS ASP PLEURAL SPACE W/IMG GUIDE  Result Date: 10/07/2020 INDICATION: Bilateral pleural fluid. EXAM: ULTRASOUND GUIDED RIGHT filled THORACENTESIS MEDICATIONS: None. COMPLICATIONS: None immediate. PROCEDURE: An ultrasound guided thoracentesis was thoroughly discussed with the patient and the patient's daughter and questions answered. The benefits, risks, alternatives and complications were also discussed. The patient understands and wishes to proceed with the procedure. Written consent was obtained. Ultrasound was performed to localize and mark an adequate pocket of fluid in the right chest. The area was then prepped and draped in the normal  sterile fashion. 1% Lidocaine was used for local anesthesia. Under ultrasound guidance a 19 gauge, 7-cm, Yueh catheter was introduced. Thoracentesis was performed. The catheter was removed and a dressing applied. FINDINGS: A total of approximately 1.2 of yellow fluid was removed. Samples were sent to the laboratory as requested by  the clinical team. IMPRESSION: Successful ultrasound guided right thoracentesis yielding 1.2 L of pleural fluid. Electronically Signed   By: Abigail Miyamoto M.D.   On: 10/07/2020 10:22    Labs:  CBC: Recent Labs    10/05/20 0535 10/06/20 0451 10/07/20 0441 10/08/20 0537  WBC 11.0* 13.9* 11.2* 11.0*  HGB 11.6* 12.3* 11.1* 11.2*  HCT 35.6* 37.6* 34.7* 34.0*  PLT 293 356 339 340    COAGS: Recent Labs    02/15/20 2002 10/08/20 0537  INR 1.0 1.1  APTT 26  --     BMP: Recent Labs    02/16/20 0549 02/18/20 0618 02/27/20 1310 04/02/20 1315 10/04/20 2017 10/05/20 0535 10/06/20 0451 10/07/20 0441 10/08/20 0537  NA 128* 132* 125* 135   < > 125* 127* 127* 129*  K 4.0 3.5 4.6 4.9   < > 3.7 4.3 4.3 3.9  CL 95* 97* 92* 96   < > 96* 96* 97* 97*  CO2 27 25 24 24    < > 20* 21* 21* 22  GLUCOSE 101* 102* 108* 94   < > 93 102* 89 87  BUN 14 10 15 16    < > 14 17 19 19   CALCIUM 8.9 9.0 9.1 9.5   < > 8.4* 8.6* 8.5* 8.3*  CREATININE 0.85 0.72 0.80 0.87   < > 0.61 0.68 0.74 0.68  GFRNONAA >60 >60 >60 79   < > >60 >60 >60 >60  GFRAA >60 >60 >60 92  --   --   --   --   --    < > = values in this interval not displayed.    LIVER FUNCTION TESTS: Recent Labs    02/15/20 2002 04/02/20 1315 10/04/20 2017 10/05/20 0535  BILITOT 0.8 0.3 0.6 0.6  AST 19 13 29 21   ALT 9 7 8 7   ALKPHOS 56 86 134* 113  PROT 7.4 6.8 7.1 6.0*  ALBUMIN 4.5 4.5 3.5 2.9*    TUMOR MARKERS: No results for input(s): AFPTM, CEA, CA199, CHROMGRNA in the last 8760 hours.  Assessment and Plan:  Admitted to [facility] 3/18 with AMS and abd distension Finding of liver lesion/abscess on CT Now scheduled for biopsy vs abscess drain placement Risks and benefits of liver lesion biopsy VS abscess aspiration/drain placement was discussed with the patient's daughter at bedside and/or patient's family including, but not limited to bleeding, infection, damage to adjacent structures or low yield requiring additional tests.  All  of the questions were answered and there is agreement to proceed. Consent signed and in chart.   Thank you for this interesting consult.  I greatly enjoyed meeting HASHIM EICHHORST and look forward to participating in their care.  A copy of this report was sent to the requesting provider on this date.  Electronically Signed: Lavonia Drafts, PA-C 10/08/2020, 9:19 AM   I spent a total of 20 Minutes    in face to face in clinical consultation, greater than 50% of which was counseling/coordinating care for liver lesion biopsy vs abscess drain

## 2020-10-08 NOTE — Procedures (Signed)
Interventional Radiology Procedure Note  Procedure: US liver mass core bx    Complications: None  Estimated Blood Loss:  min  Findings: 18 g core bxs x 3 of the anterior central liver mass    M. Daryll Brod, MD

## 2020-10-08 NOTE — Progress Notes (Signed)
Physical Therapy Treatment Patient Details Name: Luis Freeman MRN: [id number] DOB: 09-26-1932 Today's Date: 10/08/2020    History of Present Illness Luis Freeman  is a 87 y.o. male, with medical history significant for dementia, progressive supranuclear palsy, gait abnormality.  History of fall in the past with subdural hematoma, neurogenic bladder, hypertension, B12 deficiency.  -Patient was brought to ED by his daughter, due to concerns of difficulty passing urine, and severe constipation, patient is total care currently, he is having 24-hour with her family or paid caregiver, patient with progressive weakness over last few days, has been less interactive as well, with difficulty passing urine, and no good bowel movement for few days, he saw his urologist yesterday, for dark-colored urine, family were told no UTI, no acute findings, per report patient mental status has decreased, he is not as responsive as usual, patient with dementia, cannot provide any history, it was provided by daughter.  - in ED patient had sodium of 121, usually it is in the upper 120s, CT abdomen pelvis significant for significant stool burden, and abnormal liver findings suspicious for malignancy versus abscess, [facility] hospitalist consulted to admit.    PT Comments    Patient presents up on [facility] with family members in room.  Patient able to complete up to 4 sit to stands using RW while being cleaned after having bowel movement, very unsteady on feet with constant leaning backwards, limited to a couple of side steps before having to sit in chair, tolerated a few active assisted ROM exercises to BLE before closing eyes due to fatigue.  Patient put back to bed with Max assist to reposition.  Patient will benefit from continued physical therapy in hospital and recommended venue below to increase strength, balance, endurance for safe ADLs and gait.    Follow Up Recommendations  SNF     Equipment Recommendations  None  recommended by PT    Recommendations for Other Services       Precautions / Restrictions Precautions Precautions: Fall Restrictions Weight Bearing Restrictions: No    Mobility  Bed Mobility Overal bed mobility: Needs Assistance Bed Mobility: Sit to Supine       Sit to supine: Mod assist;Max assist   General bed mobility comments: increased time, labored movement    Transfers Overall transfer level: Needs assistance Equipment used: Rolling walker (2 wheeled) Transfers: Sit to/from [employer] Transfers Sit to Stand: Max assist Stand pivot transfers: Max assist       General transfer comment: very fatigued after sitting up on [facility]  Ambulation/Gait Ambulation/Gait assistance: Max assist Gait Distance (Feet): 2 Feet Assistive device: Rolling walker (2 wheeled) Gait Pattern/deviations: Decreased step length - right;Decreased step length - left;Decreased stride length;Shuffle Gait velocity: decreased   General Gait Details: limited to 1-2 steps due to fatigue, poor standing balance   Stairs             Wheelchair Mobility    Modified Rankin (Stroke Patients Only)       Balance Overall balance assessment: Needs assistance Sitting-balance support: Feet supported;No upper extremity supported Sitting balance-Leahy Scale: Fair Sitting balance - Comments: seated at EOB   Standing balance support: During functional activity;Bilateral upper extremity supported Standing balance-Leahy Scale: Poor Standing balance comment: using RW                            Cognition Arousal/Alertness: Awake/alert Behavior During Therapy: Flat affect Overall Cognitive Status: History of cognitive  impairments - at baseline                                        Exercises General Exercises - Lower Extremity Long Arc Quad: Seated;AAROM;Strengthening;Both;10 reps Hip Flexion/Marching: Seated;AAROM;Both;10 reps;Strengthening    General  Comments        Pertinent Vitals/Pain Pain Assessment: No/denies pain    Home Living                      Prior Function            PT Goals (current goals can now be found in the care plan section) Acute Rehab PT Goals Patient Stated Goal: return home with family to assist PT Goal Formulation: With patient/family Time For Goal Achievement: 10/21/20 Potential to Achieve Goals: Good Progress towards PT goals: Progressing toward goals    Frequency    Min 3X/week      PT Plan Current plan remains appropriate    Co-evaluation              AM-PAC PT "6 Clicks" Mobility   Outcome Measure  Help needed turning from your back to your side while in a flat bed without using bedrails?: A Lot Help needed moving from lying on your back to sitting on the side of a flat bed without using bedrails?: A Lot Help needed moving to and from a bed to a chair (including a wheelchair)?: A Lot Help needed standing up from a chair using your arms (e.g., wheelchair or bedside chair)?: A Lot Help needed to walk in hospital room?: A Lot Help needed climbing 3-5 steps with a railing? : Total 6 Click Score: 11    End of Session   Activity Tolerance: Patient tolerated treatment well;Patient limited by fatigue Patient left: in bed;with call bell/phone within reach;with family/visitor present Nurse Communication: Mobility status PT Visit Diagnosis: Unsteadiness on feet (R26.81);Other abnormalities of gait and mobility (R26.89);Muscle weakness (generalized) (M62.81)     Time: 3546-5681 PT Time Calculation (min) (ACUTE ONLY): 32 min  Charges:  $Therapeutic Exercise: 8-22 mins $Therapeutic Activity: 8-22 mins                     3:19 PM, 10/08/20 Lonell Grandchild, MPT Physical Therapist with [facility] [phone] office [phone] mobile phone

## 2020-10-08 NOTE — Sedation Documentation (Signed)
Pt arrived with daughter via [facility]. He is awake and alert but not oriented. His daughter speaks for him. He does not appear to be in any pain. PA and MD notified of pt arrival. Vitals stable

## 2020-10-08 NOTE — Progress Notes (Signed)
Report given to [facility] staff for patient transport back to [facility]. VSS upon transfer (see flowsheets). No questions from [place] or patient's daughter at this time. Patient transferred back to [facility] via [facility].

## 2020-10-09 DIAGNOSIS — K769 Liver disease, unspecified: Secondary | ICD-10-CM | POA: Diagnosis not present

## 2020-10-09 DIAGNOSIS — I10 Essential (primary) hypertension: Secondary | ICD-10-CM | POA: Diagnosis not present

## 2020-10-09 DIAGNOSIS — K5900 Constipation, unspecified: Secondary | ICD-10-CM | POA: Diagnosis not present

## 2020-10-09 DIAGNOSIS — F0390 Unspecified dementia without behavioral disturbance: Secondary | ICD-10-CM | POA: Diagnosis not present

## 2020-10-09 DIAGNOSIS — E871 Hypo-osmolality and hyponatremia: Secondary | ICD-10-CM | POA: Diagnosis not present

## 2020-10-09 DIAGNOSIS — R269 Unspecified abnormalities of gait and mobility: Secondary | ICD-10-CM | POA: Diagnosis not present

## 2020-10-09 DIAGNOSIS — J90 Pleural effusion, not elsewhere classified: Secondary | ICD-10-CM | POA: Diagnosis not present

## 2020-10-09 LAB — BASIC METABOLIC PANEL
Anion gap: 9 (ref 5–15)
BUN: 17 mg/dL (ref 8–23)
CO2: 22 mmol/L (ref 22–32)
Calcium: 8.5 mg/dL — ABNORMAL LOW (ref 8.9–10.3)
Chloride: 96 mmol/L — ABNORMAL LOW (ref 98–111)
Creatinine, Ser: 0.65 mg/dL (ref 0.61–1.24)
GFR, Estimated: >60 mL/min (ref >60)
Glucose, Bld: 95 mg/dL (ref 70–99)
Potassium: 3.8 mmol/L (ref 3.5–5.1)
Sodium: 127 mmol/L — ABNORMAL LOW (ref 135–145)

## 2020-10-09 LAB — CBC WITH DIFFERENTIAL/PLATELET
Abs Immature Granulocytes: 0.36 10*3/uL — ABNORMAL HIGH (ref 0.00–0.07)
Basophils Absolute: 0.1 10*3/uL (ref 0.0–0.1)
Basophils Relative: 1 %
Eosinophils Absolute: 0.1 10*3/uL (ref 0.0–0.5)
Eosinophils Relative: 1 %
HCT: 36 % — ABNORMAL LOW (ref 39.0–52.0)
Hemoglobin: 11.7 g/dL — ABNORMAL LOW (ref 13.0–17.0)
Immature Granulocytes: 3 %
Lymphocytes Relative: 5 %
Lymphs Abs: 0.6 10*3/uL — ABNORMAL LOW (ref 0.7–4.0)
MCH: 31.5 pg (ref 26.0–34.0)
MCHC: 32.5 g/dL (ref 30.0–36.0)
MCV: 97 fL (ref 80.0–100.0)
Monocytes Absolute: 0.9 10*3/uL (ref 0.1–1.0)
Monocytes Relative: 8 %
Neutro Abs: 9 10*3/uL — ABNORMAL HIGH (ref 1.7–7.7)
Neutrophils Relative %: 82 %
Platelets: 342 10*3/uL (ref 150–400)
RBC: 3.71 MIL/uL — ABNORMAL LOW (ref 4.22–5.81)
RDW: 13 % (ref 11.5–15.5)
WBC: 11 10*3/uL — ABNORMAL HIGH (ref 4.0–10.5)
nRBC: 0 % (ref 0.0–0.2)

## 2020-10-09 LAB — MAGNESIUM: Magnesium: 2.1 mg/dL (ref 1.7–2.4)

## 2020-10-09 NOTE — Progress Notes (Signed)
Patient Demographics:    Luis Freeman, is a 87 y.o. male, DOB - 11-19-1932, EXB:[id number]  Admit date - 10/04/2020   Admitting Physician Luis Patricia, MD  Outpatient Primary MD for the patient is Luis Noble, MD  LOS - 5   Chief Complaint  Patient presents with  . Weakness        Subjective:    Luis Freeman today has no fevers, no emesis,  No chest pain,   --Resting comfortably, had multiple BMs yesterday after Gastrografin enema -Daughter Luis Freeman at bedside, another daughter Luis Freeman the phone -No fever  Or chills  -No nausea or vomiting  Assessment  & Plan :    Active Problems:   Acute on Chronic HypoNatremia   Gait disorder   Dementia (HCC)   Movement disorder   HTN (hypertension)   Constipation   Liver lesion   Bilateral pleural effusion   Status post thoracentesis   Brief Summary:- 86 y.o.malewith medical history significant forprogressive supranuclear palsy with significant gait and speech problems, HTN and history of B12 deficiency, 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, neurogenic bladder, neurogenic bowel with severe constipation and recurrent episodes of hyponatremia admitted on 10/04/20 with acute metabolic encephalopathy, worsening hyponatremia constipation as well as anterior central liver mass  and bilateral pleural effusions and abnormal CT abdomen and pelvis findings  A/p 1)Anterior Central Liver Mass  and bilateral pleural effusions-- s/p 18 g core bxs x 3 of the anterior central liver mass and post right-sided thoracentesis on 10/08/2020 --Pathology/cytology pending -AFP 3.1  2)Ambulatory dysfunction/unsteady gait/recurrent falls----PT eval appreciated recommends SNF rehab,  --Patient is now more unsteady more at risk of falling and requires SNF rehab to return to his pre- admission level of functioning ---Gait concerns persist,  -Patient with  poor safety awareness and high risk for falling -Patient and family are agreeable to SNF placement --Awaiting SNF bed availability as well as insurance approval for SNF rehab  3)Progressive Supranuclear Palsy--- with significant gait, balance and ambulatory concerns as well as speech concerns -Patient also has some cognitive decline -Pt sees Dr Luis Freeman, at [facility]  4)HTN--- c/n amlodipine and benazepril 40 mg daily and hydralazine 25 mg 3 times daily  5) H/o B12 deficiency----previously received B12 shots, repeat serum B12 on 10/06/2019 is 4163  6)HypoNatremia--acute on chronic symptomatic hyponatremia----POA-- -sodium currently in the high 120s -Consider sodium chloride tablets  --Patient is to avoid excessive free water  7)Acute on chronic chronic urinary retention--- neurogenic bladder in the setting of progressive supranuclear palsy decision was made not to use Flomax due to patient's history of severe orthostatic hypotension ---Foley in situ, follow-up with Dr. Louis Freeman [facility]rologist [facility] -urine culture negative  8)Severe Constipation in the setting of neurogenic bowel secondary to progressive supranuclear palsy - Appreciate GI consult and orders for gastrograffin enema on 3/21 - Good results from gastrograffin enema and oral Linzess daily   9)Social/Ethics--patient is a DNR, awaiting palliative care consult to further delineate goals of care  Disposition/Need for in-Hospital Stay- patient unable to be discharged at this time due to -awaiting transfer to SNF rehab, unsafe discharge plan at this time ---Awaiting SNF bed availability as well as insurance approval for SNF rehab  Status is: Inpatient  Remains inpatient appropriate because:Please see above   Disposition: The patient is from: Home              Anticipated d/c is to: SNF              Anticipated d/c date is: 1 day              Patient currently is medically stable to  d/c. Barriers: Awaiting SNF placement  Code Status :  -  Code Status: DNR   Family Communication:    (patient is alert, awake and coherent)  Discussed with daughters Luis Freeman and Luis Freeman  Consults  :  Gi/Palliative  DVT Prophylaxis  :   - SCDs   heparin injection 5,000 Units Start: 10/09/20 0600 SCDs Start: 10/04/20 2313    Lab Results  Component Value Date   PLT 342 10/09/2020    Inpatient Medications  Scheduled Meds: . amLODipine  10 mg Oral Daily  . benazepril  40 mg Oral q AM  . bisacodyl  10 mg Rectal Q1200  . carbidopa-levodopa  1.5 tablet Oral TID  . Chlorhexidine Gluconate Cloth  6 each Topical Daily  . cholecalciferol  1,000 Units Oral q AM  . heparin  5,000 Units Subcutaneous Q8H  . hydrALAZINE  25 mg Oral TID  . linaclotide  290 mcg Oral QAC breakfast  . memantine  10 mg Oral Q lunch   Continuous Infusions: PRN Meds:.acetaminophen **OR** acetaminophen, ondansetron (ZOFRAN) IV    Anti-infectives (From admission, onward)   None        Objective:   Vitals:   10/08/20 1303 10/08/20 1341 10/08/20 2021 10/09/20 0515  BP: 139/61  128/66 138/62  Pulse: 88  98 83  Resp: 16  20 16   Temp: (!) 97.4 F (36.3 C)  98.2 F (36.8 C) 98.4 F (36.9 C)  TempSrc: Oral  Oral Oral  SpO2: 95% 95% 95% 96%  Weight:      Height:        Wt Readings from Last 3 Encounters:  10/04/20 68.5 kg  09/12/20 68.5 kg  08/19/20 67.9 kg     Intake/Output Summary (Last 24 hours) at 10/09/2020 1333 Last data filed at 10/09/2020 1219 Gross per 24 hour  Intake 240 ml  Output 1400 ml  Net -1160 ml     Physical Exam  Gen:- Awake Alert,  In no apparent distress  HEENT:- [place].AT, No sclera icterus Neck-Supple Neck,No JVD,.  Lungs-  CTAB , fair symmetrical air movement CV- S1, S2 normal, regular  Abd-  +ve B.Sounds, Abd Soft, No tenderness,    Extremity/Skin:- No  edema, pedal pulses present  Psych-baseline cognitive and memory deficits  neuro-very unsteady gait,  stiffness, no additional new focal deficits, no tremors   Data Review:   Micro Results Recent Results (from the past 240 hour(s))  SARS CORONAVIRUS 2 (TAT 6-24 HRS) Nasopharyngeal Nasopharyngeal Swab     Status: None   Collection Time: 10/04/20 10:00 PM   Specimen: Nasopharyngeal Swab  Result Value Ref Range Status   SARS Coronavirus 2 NEGATIVE NEGATIVE Final    Comment: (NOTE) SARS-CoV-2 target nucleic acids are NOT DETECTED.  The SARS-CoV-2 RNA is generally detectable in upper and lower respiratory specimens during the acute phase of infection. Negative results do not preclude SARS-CoV-2 infection, do not rule out co-infections with other pathogens, and should not be used as the sole basis for treatment or other patient management decisions. Negative results must be combined  with clinical observations, patient history, and epidemiological information. The expected result is Negative.  Fact Sheet for Patients: SugarRoll.be  Fact Sheet for Healthcare Providers: https://www.woods-mathews.com/  This test is not yet approved or cleared by the [place] FDA and  has been authorized for detection and/or diagnosis of SARS-CoV-2 by FDA under an Emergency Use Authorization (EUA). This EUA will remain  in effect (meaning this test can be used) for the duration of the COVID-19 declaration under Se ction 564(b)(1) of the Act, 21 U.S.C. section 360bbb-3(b)(1), unless the authorization is terminated or revoked sooner.  Performed at [facility], [place] [address], [place], [place] [address]   Urine Culture     Status: None   Collection Time: 10/04/20 10:41 PM   Specimen: Urine, Clean Catch  Result Value Ref Range Status   Specimen Description   Final    URINE, CLEAN CATCH Performed at [facility], [address] [place], [place] [address]    Special Requests   Final    NONE Performed at [facility], [address] [place],  [place] [address]    Culture   Final    NO GROWTH Performed at [facility], [place] [address], [place], [place] [address]    Report Status 10/06/2020 FINAL  Final  Culture, body fluid w Gram Stain-bottle     Status: None (Preliminary result)   Collection Time: 10/07/20  9:45 AM   Specimen: Pleura  Result Value Ref Range Status   Specimen Description PLEURAL  Final   Special Requests 10CC  Final   Culture   Final    NO GROWTH 2 DAYS Performed at [facility], [address], [place], [place] [address]    Report Status PENDING  Incomplete  Gram stain     Status: None   Collection Time: 10/07/20  9:45 AM   Specimen: Pleura  Result Value Ref Range Status   Specimen Description PLEURAL  Final   Special Requests NONE  Final   Gram Stain   Final    NO ORGANISMS SEEN WBC PRESENT,BOTH PMN AND MONONUCLEAR Performed at [facility], [address] [place],  [address]    Report Status 10/07/2020 FINAL  Final  Fungus Culture With Stain     Status: None (Preliminary result)   Collection Time: 10/07/20  9:47 AM  Result Value Ref Range Status   Fungus Stain Final report  Final    Comment: (NOTE) Performed At: [facility] [address] [place], [place] [id number] Rush Farmer MD [id number]    Fungus (Mycology) Culture PENDING  Incomplete   Fungal Source PLEURAL  Final    Comment: Performed at [facility], [address], [place],  [address]  Fungus Culture Result     Status: None   Collection Time: 10/07/20  9:47 AM  Result Value Ref Range Status   Result 1 Comment  Final    Comment: (NOTE) KOH/Calcofluor preparation:  no fungus observed. Performed At: [facility] [place], [place] [id number] Rush Farmer MD [id number]     Radiology Reports DG Chest 1 View  Result Date: 10/07/2020 CLINICAL DATA:  Status post right-sided thoracentesis EXAM: CHEST  1 VIEW COMPARISON:  1 day prior earlier today at 5:02 a.m. FINDINGS: Midline  trachea. Mild cardiomegaly. Layering small left pleural effusion is unchanged. Significant improvement to resolution in right-sided pleural fluid. Skin fold over the chest bilaterally, but no pneumothorax. Underlying mild interstitial edema. Left base airspace disease persists. IMPRESSION:  Improved resolved right-sided pleural effusion, without pneumothorax. Persistent left pleural effusion, adjacent atelectasis, and interstitial edema. Electronically Signed   By: Abigail Miyamoto M.D.   On: 10/07/2020 10:20   DG Abd 1 View  Result Date: 10/04/2020 CLINICAL DATA:  Lack of bowel movement EXAM: ABDOMEN - 1 VIEW COMPARISON:  02/27/2020 FINDINGS: Prominent stool throughout the colon favors constipation. Thoracic and lumbar spondylosis. Likely chronic suspected lumbar compression fractures at L2, L4, and L5. Poor definition of the left hemidiaphragm, cannot exclude left basilar airspace opacity, consider chest radiography for further workup. Questionable blunting of the right lateral costophrenic angle. IMPRESSION: 1. Prominent stool throughout the colon favors constipation. 2. Poor definition of the left hemidiaphragm, cannot exclude left basilar airspace opacity, consider chest radiography for further workup. 3. Possible small right pleural effusion. 4. Likely chronic lumbar compression fractures at L2, L4, and L5. Electronically Signed   By: Van Clines M.D.   On: 10/04/2020 17:48   CT Head Wo Contrast  Result Date: 10/04/2020 CLINICAL DATA:  Abdominal distension with altered mental status EXAM: CT HEAD WITHOUT CONTRAST TECHNIQUE: Contiguous axial images were obtained from the base of the skull through the vertex without intravenous contrast. COMPARISON:  CT head 02/27/2020 FINDINGS: Brain: Periventricular white matter and corona radiata hypodensities favor chronic ischemic microvascular white matter disease. Otherwise, the brainstem, cerebellum, cerebral peduncles, thalamus, basal ganglia, basilar  cisterns, and ventricular system appear within normal limits. No intracranial hemorrhage, mass lesion, or acute CVA. Vascular: There is atherosclerotic calcification of the cavernous carotid arteries bilaterally. Skull: Unremarkable Sinuses/Orbits: Mild chronic ethmoid sinusitis. Other: No supplemental non-categorized findings. IMPRESSION: 1. No acute intracranial findings. 2. Periventricular white matter and corona radiata hypodensities favor chronic ischemic microvascular white matter disease. 3. Mild chronic ethmoid sinusitis. Electronically Signed   By: Van Clines M.D.   On: 10/04/2020 22:08   CT ABDOMEN PELVIS W CONTRAST  Result Date: 10/04/2020 CLINICAL DATA:  Abdominal distention and altered mental status, constipation. EXAM: CT ABDOMEN AND PELVIS WITH CONTRAST TECHNIQUE: Multidetector CT imaging of the abdomen and pelvis was performed using the standard protocol following bolus administration of intravenous contrast. CONTRAST:  130mL OMNIPAQUE IOHEXOL 300 MG/ML  SOLN COMPARISON:  Abdomen radiograph 02/27/2020 FINDINGS: Despite efforts by the technologist and patient, motion artifact is present on today's exam and could not be eliminated. This reduces exam sensitivity and specificity. Lower chest: Large bilateral pleural effusions with associated passive atelectasis. These effusions are nonspecific for transudative or exudative etiology. Left anterior descending and right coronary artery atherosclerosis along with descending thoracic aortic atherosclerosis. Small pericardial effusion. Hepatobiliary: Masslike process with marginal enhancement and central hypoenhancement/central necrosis centered in the left hepatic lobe, measuring 14.2 by 10.3 by 9.9 cm on image 51 series 5. Possibilities include malignancy or abscess. Linear lucency inferiorly in segment 3 of the liver could be from a prominent intrahepatic bile duct or possibly portal vein thrombosis of the associated portal vein tributary. The  main portal vein is patent. Gallbladder grossly unremarkable, somewhat blurred by motion artifact. Pancreas: Unremarkable Spleen: Unremarkable. Adrenals/Urinary Tract: Both adrenal glands appear normal. Simple appearing cysts of the right kidney. Urinary bladder unremarkable. Stomach/Bowel: Prominent stool throughout much of the colon, but not in the sigmoid colon or rectum. It is difficult to identify the site of transition due to the motion artifact in today's exam. However, it is probably just distal to the splenic flexure. Borderline rectal wall thickening, without surrounding adenopathy. Vascular/Lymphatic: Aortoiliac atherosclerotic vascular disease. Reproductive: Mild prostatomegaly. Other: Mild diffuse subcutaneous  and mesenteric edema. Musculoskeletal: Remote compression fractures at L2, L4, and L5. Lumbar spondylosis and degenerative disc disease causing multilevel impingement. IMPRESSION: 1. Masslike process with marginal enhancement and central necrosis centered in the left hepatic lobe, measuring 14.2 by 10.3 by 9.9 cm (volume = 760 cm^3). Possibilities include malignancy or abscess. Correlate with 2. Linear lucency inferiorly in segment 3 of the liver could be from a dilated intrahepatic bile duct or possibly portal vein thrombosis of the associated portal vein tributary. 3. Large bilateral pleural effusions with associated passive atelectasis. These effusions are nonspecific for transudative or exudative etiology. 4. Prominent stool throughout much of the colon, but not in the sigmoid colon or rectum. Borderline rectal wall thickening, without surrounding adenopathy. 5. Other imaging findings of potential clinical significance: Coronary atherosclerosis. Small pericardial effusion. Remote compression fractures at L2, L4, and L5. Lumbar spondylosis and degenerative disc disease causing multilevel impingement. Mild prostatomegaly. 6. Despite efforts by the technologist and patient, motion artifact is  present on today's exam and could not be eliminated. This reduces exam sensitivity and specificity. 7. Aortic atherosclerosis. Aortic Atherosclerosis (ICD10-I70.0). Electronically Signed   By: Van Clines M.D.   On: 10/04/2020 22:05   US BIOPSY (LIVER)  Result Date: 10/08/2020 INDICATION: Indeterminate anterior large left liver mass EXAM: ULTRASOUND GUIDED CORE BIOPSY OF ANTERIOR LEFT LIVER MASS MEDICATIONS: 1% LIDOCAINE LOCAL ANESTHESIA/SEDATION: Moderate Sedation Time:  None. The patient was continuously monitored during the procedure by the interventional radiology nurse under my direct supervision. FLUOROSCOPY TIME:  Fluoroscopy Time: None. COMPLICATIONS: None immediate. PROCEDURE: The procedure, risks, benefits, and alternatives were explained to the patient. Questions regarding the procedure were encouraged and answered. The patient understands and consents to the procedure. Previous imaging reviewed. Preliminary ultrasound performed. The large anterior left hepatic mass was localized from a subcostal anterior approach. Under sterile conditions and local anesthesia, a 17 gauge 6.8 cm access was advanced into the lesion. Needle position confirmed with ultrasound. 18 gauge core biopsies obtained. These were intact and non fragmented. Samples placed in formalin. Needle tract occluded with Gel-Foam. Postprocedure imaging demonstrates no hemorrhage or hematoma. Patient tolerated biopsy well. FINDINGS: Imaging confirms needle placement in the anterior large left liver lesion for core biopsy IMPRESSION: Successful ultrasound anterior left hepatic mass 18 gauge core biopsy Electronically Signed   By: Jerilynn Mages.  Shick M.D.   On: 10/08/2020 11:44   DG CHEST PORT 1 VIEW  Result Date: 10/07/2020 CLINICAL DATA:  Pleural effusion EXAM: PORTABLE CHEST 1 VIEW COMPARISON:  October 04, 2020 FINDINGS: Pleural effusions again noted bilaterally, layering. There is persistent airspace opacity in the medial left base.  Bilateral apical pleural thickening is stable. No new opacity. Heart is upper normal in size with pulmonary vascularity normal. No adenopathy. There is aortic atherosclerosis. Bones are osteoporotic. IMPRESSION: Layering pleural effusions bilaterally. Airspace opacity left lower lobe which may represent atelectasis with potential underlying pneumonia. No new opacity. Stable cardiac silhouette. Aortic Atherosclerosis (ICD10-I70.0). Electronically Signed   By: Lowella Grip III M.D.   On: 10/07/2020 07:59   DG Chest Port 1 View  Result Date: 10/04/2020 CLINICAL DATA:  Weakness EXAM: PORTABLE CHEST 1 VIEW COMPARISON:  10/20/2015 FINDINGS: Bilateral layering pleural effusions. Bilateral lower lobe airspace opacities. Heart is normal size. Aortic atherosclerosis. No acute bony abnormality. IMPRESSION: Bilateral layering effusions with bibasilar atelectasis or infiltrates. Electronically Signed   By: Rolm Baptise M.D.   On: 10/04/2020 19:45   DG BE (COLON)W SINGLE CM (SOL OR THIN BA)  Result Date:  10/07/2020 CLINICAL DATA:  Constipation with lack of bowel movements for 8 days. Hepatic mass on CT. EXAM: WATER SOLUBLE CONTRAST ENEMA TECHNIQUE: Single-contrast non prepped barium enema was performed for both therapeutic and attempted diagnostic purposes. FLUOROSCOPY TIME:  Fluoroscopy Time:  3 minutes and 30 seconds Radiation Exposure Index (if provided by the fluoroscopic device): 4 Number of Acquired Spot Images: Not applicable. COMPARISON:  Abdominopelvic CT of 10/04/2020. FINDINGS: Preprocedure scout film demonstrates a large amount of colonic stool. No bowel obstruction. Focused, limited single-contrast exam performed with the patient in a left lateral and supine position. Limitations secondary to patient's age and clinical status. The rectum and sigmoid are grossly normal, without circumferential narrowing. From the sigmoid proximally, the colon is stool filled. Contrast filled to approximately the level of  the mid transverse colon. IMPRESSION: 1. Focused therapeutic and less so diagnostic non prepped barium enema performed. 2. No evidence of circumferential or obstructive mass, especially within the rectum or sigmoid. Electronically Signed   By: Abigail Miyamoto M.D.   On: 10/07/2020 11:46   US THORACENTESIS ASP PLEURAL SPACE W/IMG GUIDE  Result Date: 10/07/2020 INDICATION: Bilateral pleural fluid. EXAM: ULTRASOUND GUIDED RIGHT filled THORACENTESIS MEDICATIONS: None. COMPLICATIONS: None immediate. PROCEDURE: An ultrasound guided thoracentesis was thoroughly discussed with the patient and the patient's daughter and questions answered. The benefits, risks, alternatives and complications were also discussed. The patient understands and wishes to proceed with the procedure. Written consent was obtained. Ultrasound was performed to localize and mark an adequate pocket of fluid in the right chest. The area was then prepped and draped in the normal sterile fashion. 1% Lidocaine was used for local anesthesia. Under ultrasound guidance a 19 gauge, 7-cm, Yueh catheter was introduced. Thoracentesis was performed. The catheter was removed and a dressing applied. FINDINGS: A total of approximately 1.2 of yellow fluid was removed. Samples were sent to the laboratory as requested by the clinical team. IMPRESSION: Successful ultrasound guided right thoracentesis yielding 1.2 L of pleural fluid. Electronically Signed   By: Abigail Miyamoto M.D.   On: 10/07/2020 10:22     CBC Recent Labs  Lab 10/04/20 2017 10/05/20 0535 10/06/20 0451 10/07/20 0441 10/08/20 0537 10/09/20 0519  WBC 12.0* 11.0* 13.9* 11.2* 11.0* 11.0*  HGB 12.9* 11.6* 12.3* 11.1* 11.2* 11.7*  HCT 38.8* 35.6* 37.6* 34.7* 34.0* 36.0*  PLT 324 293 356 339 340 342  MCV 96.0 97.0 96.2 96.9 96.9 97.0  MCH 31.9 31.6 31.5 31.0 31.9 31.5  MCHC 33.2 32.6 32.7 32.0 32.9 32.5  RDW 12.7 12.6 12.7 12.7 12.9 13.0  LYMPHSABS 0.4*  --  0.4* 0.6* 0.6* 0.6*  MONOABS 1.1*   --  0.9 1.0 0.9 0.9  EOSABS 0.0  --  0.0 0.1 0.1 0.1  BASOSABS 0.1  --  0.0 0.0 0.0 0.1    Chemistries  Recent Labs  Lab 10/04/20 2017 10/05/20 0535 10/06/20 0451 10/07/20 0441 10/08/20 0537 10/09/20 0519  NA 121* 125* 127* 127* 129* 127*  K 4.2 3.7 4.3 4.3 3.9 3.8  CL 90* 96* 96* 97* 97* 96*  CO2 22 20* 21* 21* 22 22  GLUCOSE 104* 93 102* 89 87 95  BUN 18 14 17 19 19 17   CREATININE 0.77 0.61 0.68 0.74 0.68 0.65  CALCIUM 8.5* 8.4* 8.6* 8.5* 8.3* 8.5*  MG  --   --  2.0 2.0 2.0 2.1  AST 29 21  --   --   --   --   ALT 8 7  --   --   --   --  ALKPHOS 134* 113  --   --   --   --   BILITOT 0.6 0.6  --   --   --   --    ------------------------------------------------------------------------------------------------------------------ No results for input(s): CHOL, HDL, LDLCALC, TRIG, CHOLHDL, LDLDIRECT in the last 72 hours.  No results found for: HGBA1C ------------------------------------------------------------------------------------------------------------------ No results for input(s): TSH, T4TOTAL, T3FREE, THYROIDAB in the last 72 hours.  Invalid input(s): FREET3 ------------------------------------------------------------------------------------------------------------------ No results for input(s): VITAMINB12, FOLATE, FERRITIN, TIBC, IRON, RETICCTPCT in the last 72 hours.  Coagulation profile Recent Labs  Lab 10/08/20 0537  INR 1.1    No results for input(s): DDIMER in the last 72 hours.  Cardiac Enzymes No results for input(s): CKMB, TROPONINI, MYOGLOBIN in the last 168 hours.  Invalid input(s): CK ------------------------------------------------------------------------------------------------------------------    Component Value Date/Time   BNP 93.0 10/04/2020 2000     Courage Emokpae M.D on 10/09/2020 at 1:33 PM  Go to [email or web address] - for contact info  [facility] - Office  [phone]

## 2020-10-09 NOTE — Progress Notes (Signed)
Physical Therapy Treatment Patient Details Name: Luis Freeman MRN: [id number] DOB: 1933-01-23 Today's Date: 10/09/2020    History of Present Illness Luis Freeman  is a 87 y.o. male, with medical history significant for dementia, progressive supranuclear palsy, gait abnormality.  History of fall in the past with subdural hematoma, neurogenic bladder, hypertension, B12 deficiency.  -Patient was brought to ED by his daughter, due to concerns of difficulty passing urine, and severe constipation, patient is total care currently, he is having 24-hour with her family or paid caregiver, patient with progressive weakness over last few days, has been less interactive as well, with difficulty passing urine, and no good bowel movement for few days, he saw his urologist yesterday, for dark-colored urine, family were told no UTI, no acute findings, per report patient mental status has decreased, he is not as responsive as usual, patient with dementia, cannot provide any history, it was provided by daughter.  - in ED patient had sodium of 121, usually it is in the upper 120s, CT abdomen pelvis significant for significant stool burden, and abnormal liver findings suspicious for malignancy versus abscess, [facility] hospitalist consulted to admit.    PT Comments    Patient requires repeated verbal/tactile cueing in order to complete functional mobility with fair carryover due to generalized weakness and HOH.  Patient tends to fall backwards with leaning to the left while seated at bedside, able to keep trunk in midline for up to 5-6 minutes before fatiguing, requires active assistance to complete BLE exercises and unable to take steps with RW due to buckling of knees and posterior leaning.  Patient tolerated sitting up in chair after therapy with family members present in room - nursing staff aware.  Patient will benefit from continued physical therapy in hospital and recommended venue below to increase strength, balance,  endurance for safe ADLs and gait.    Follow Up Recommendations  SNF     Equipment Recommendations  None recommended by PT    Recommendations for Other Services       Precautions / Restrictions Precautions Precautions: Fall Restrictions Weight Bearing Restrictions: No    Mobility  Bed Mobility Overal bed mobility: Needs Assistance Bed Mobility: Supine to Sit     Supine to sit: Mod assist     General bed mobility comments: increased time, labored movement    Transfers Overall transfer level: Needs assistance   Transfers: Sit to/from Stand;Stand Pivot Transfers Sit to Stand: Max assist Stand pivot transfers: Max assist       General transfer comment: frequent leaning backwards due to extensor pattern, have to block knees and lift using gait belt  Ambulation/Gait                 Stairs             Wheelchair Mobility    Modified Rankin (Stroke Patients Only)       Balance Overall balance assessment: Needs assistance Sitting-balance support: Feet supported;No upper extremity supported Sitting balance-Leahy Scale: Poor Sitting balance - Comments: fair/poor with occasional leaning backwards and to the left Postural control: Posterior lean;Left lateral lean Standing balance support: During functional activity;Bilateral upper extremity supported Standing balance-Leahy Scale: Poor Standing balance comment: using RW                            Cognition Arousal/Alertness: Awake/alert Behavior During Therapy: Flat affect Overall Cognitive Status: History of cognitive impairments - at baseline  Exercises General Exercises - Lower Extremity Long Arc Quad: Seated;AAROM;Strengthening;Both;10 reps Hip Flexion/Marching: Seated;AAROM;Both;10 reps;Strengthening    General Comments        Pertinent Vitals/Pain Pain Assessment: No/denies pain    Home Living                       Prior Function            PT Goals (current goals can now be found in the care plan section) Acute Rehab PT Goals Patient Stated Goal: return home with family to assist PT Goal Formulation: With patient/family Time For Goal Achievement: 10/21/20 Potential to Achieve Goals: Good Progress towards PT goals: Progressing toward goals    Frequency    Min 3X/week      PT Plan Current plan remains appropriate    Co-evaluation              AM-PAC PT "6 Clicks" Mobility   Outcome Measure  Help needed turning from your back to your side while in a flat bed without using bedrails?: A Lot Help needed moving from lying on your back to sitting on the side of a flat bed without using bedrails?: A Lot Help needed moving to and from a bed to a chair (including a wheelchair)?: A Lot Help needed standing up from a chair using your arms (e.g., wheelchair or bedside chair)?: A Lot Help needed to walk in hospital room?: Total Help needed climbing 3-5 steps with a railing? : Total 6 Click Score: 10    End of Session Equipment Utilized During Treatment: Gait belt Activity Tolerance: Patient tolerated treatment well;Patient limited by fatigue Patient left: in chair;with call bell/phone within reach;with family/visitor present;with chair alarm set Nurse Communication: Mobility status PT Visit Diagnosis: Unsteadiness on feet (R26.81);Other abnormalities of gait and mobility (R26.89);Muscle weakness (generalized) (M62.81)     Time: 3338-3291 PT Time Calculation (min) (ACUTE ONLY): 34 min  Charges:  $Therapeutic Exercise: 8-22 mins $Therapeutic Activity: 8-22 mins                     3:11 PM, 10/09/20 Lonell Grandchild, MPT Physical Therapist with [facility] 336 [phone] office [phone] mobile phone

## 2020-10-09 NOTE — Progress Notes (Signed)
Subjective: Today the patient is lying in bed sleeping.  Daughter is at bedside, states that he will likely be coming around/breaking up shortly.  He generally answers questions in the affirmative or negative but rebounds up or thumbs down.  Says he is having some abdominal discomfort as indicated by thumbs up.  Does not objectively appear to be in pain, even with abdominal palpation.  His daughter states that he has been deconditioned and is working with PT/OT.  They are actually requesting discharge to skilled nursing to help with deconditioning and therapy, which I indicated I would discuss with the hospitalist.  They do not have any other concerns at this time.  Objective: Vital signs in last 24 hours: Temp:  [98.2 F (36.8 C)-98.4 F (36.9 C)] 98.4 F (36.9 C) (03/23 0515) Pulse Rate:  [83-98] 83 (03/23 0515) Resp:  [16-20] 16 (03/23 0515) BP: (128-138)/(62-66) 138/62 (03/23 0515) SpO2:  [95 %-96 %] 96 % (03/23 0515) Last BM Date: 10/07/20 General:   Sleeping, unable to accurately assess orientation Head:  Normocephalic and atraumatic. Heart:  S1, S2 present, no murmurs noted.  Lungs: Clear to auscultation bilaterally, without wheezing, rales, or rhonchi.  Abdomen:  Bowel sounds present, soft. Abdomen distended but soft, tympanic, no obvious sign of pain with palpation. No HSM or hernias noted. No rebound or guarding. No masses appreciated  Msk:  Symmetrical without gross deformities. Pulses:  Normal bilateral DP pulses noted. Extremities:  Without clubbing or edema. Neurologic:  Unable to assess due to sleeping/sleepiness. Psych:  Alert and cooperative. Normal mood and affect.  Intake/Output from previous day: 03/22 0701 - 03/23 0700 In: 480 [P.O.:480] Out: 1150 [Urine:1150] Intake/Output this shift: Total I/O In: 0  Out: 650 [Urine:650]  Lab Results: Recent Labs    10/07/20 0441 10/08/20 0537 10/09/20 0519  WBC 11.2* 11.0* 11.0*  HGB 11.1* 11.2* 11.7*  HCT 34.7*  34.0* 36.0*  PLT 339 340 342   BMET Recent Labs    10/07/20 0441 10/08/20 0537 10/09/20 0519  NA 127* 129* 127*  K 4.3 3.9 3.8  CL 97* 97* 96*  CO2 21* 22 22  GLUCOSE 89 87 95  BUN 19 19 17   CREATININE 0.74 0.68 0.65  CALCIUM 8.5* 8.3* 8.5*   LFT No results for input(s): PROT, ALBUMIN, AST, ALT, ALKPHOS, BILITOT, BILIDIR, IBILI in the last 72 hours. PT/INR Recent Labs    10/08/20 0537  LABPROT 13.5  INR 1.1   Hepatitis Panel No results for input(s): HEPBSAG, HCVAB, HEPAIGM, HEPBIGM in the last 72 hours.   Studies/Results: US BIOPSY (LIVER)  Result Date: 10/08/2020 INDICATION: Indeterminate anterior large left liver mass EXAM: ULTRASOUND GUIDED CORE BIOPSY OF ANTERIOR LEFT LIVER MASS MEDICATIONS: 1% LIDOCAINE LOCAL ANESTHESIA/SEDATION: Moderate Sedation Time:  None. The patient was continuously monitored during the procedure by the interventional radiology nurse under my direct supervision. FLUOROSCOPY TIME:  Fluoroscopy Time: None. COMPLICATIONS: None immediate. PROCEDURE: The procedure, risks, benefits, and alternatives were explained to the patient. Questions regarding the procedure were encouraged and answered. The patient understands and consents to the procedure. Previous imaging reviewed. Preliminary ultrasound performed. The large anterior left hepatic mass was localized from a subcostal anterior approach. Under sterile conditions and local anesthesia, a 17 gauge 6.8 cm access was advanced into the lesion. Needle position confirmed with ultrasound. 18 gauge core biopsies obtained. These were intact and non fragmented. Samples placed in formalin. Needle tract occluded with Gel-Foam. Postprocedure imaging demonstrates no hemorrhage or hematoma. Patient tolerated  biopsy well. FINDINGS: Imaging confirms needle placement in the anterior large left liver lesion for core biopsy IMPRESSION: Successful ultrasound anterior left hepatic mass 18 gauge core biopsy Electronically Signed    By: Jerilynn Mages.  Shick M.D.   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 Gastrografin enemacompleted 3/21 for diagnostic and therapeutic purposes. Rectum and sigmoid grossly normal, and from the sigmoid proximally, the colon is stool-filled. He was started on Linzess 290 mcg two days ago and initially had 4 large BMs since Gastrografin enema and Linzess. Abdomen continues to be distended and tympanic but soft. Some apparent abdominal discomfort with palpation. He will continue on Linzess.   Hyponatremia: Improved from admission. Na 127 today. Ongoing management per hospitalist.   Liver lesion: Incidentally noticed on CT. Concern for neoplasm, unable to rule out abscess. LFTs normal. AFP normal. Underwent biopsy with IR at [facility], pathology pending.   Palliative care is following with patient and plans to have further discussions after liver biopsy results.    Plan: 1. Follow-up visit for biopsy results 2. Continue Linzess 290 mcg daily 3. Appreciate palliative care input and support 4. Continue supportive measures 5. Follow-up for any worsening symptoms   Thank you for allowing [place] to participate in the care of [place], DNP, AGNP-C Adult & Gerontological Nurse Practitioner [facility]    LOS: 5 days    10/09/2020, 1:28 PM

## 2020-10-09 NOTE — Clinical Social Work Note (Signed)
Attempted call to patient's daughter, [name], unsuccessful.  Per attending family wants patient to go to [facility] for SNF. Referral sent to requested facility.      Coron Rossano, Clydene Pugh, LCSW

## 2020-10-09 NOTE — NC FL2 (Signed)
[place] LEVEL OF CARE SCREENING TOOL     IDENTIFICATION  Patient Name: Luis Freeman Birthdate: Dec 28, 1932 Sex: male Admission Date (Current Location): 10/04/2020  [facility] and [place] Number:  [employer] and Address:  [facility] [address], [place]      Provider Number: [id number]  Attending Physician Name and Address:  Roxan Hockey, MD  Relative Name and Phone Number:       Current Level of Care: Hospital Recommended Level of Care: [place] Prior Approval Number:    Date Approved/Denied:   PASRR Number: [id number]  Discharge Plan: SNF    Current Diagnoses: Patient Active Problem List   Diagnosis Date Noted  . Constipation   . Liver lesion   . Bilateral pleural effusion   . Status post thoracentesis   . Nocturia more than twice per night 09/12/2020  . At high risk for injury related to fall 09/12/2020  . Dysarthria and anarthria 09/12/2020  . Acute metabolic encephalopathy 58/03/9832  . Acute on Chronic HypoNatremia 02/21/2020  . HTN (hypertension) 02/16/2020  . SDH (subdural hematoma) ([place]) 02/15/2020  . PSP (progressive supranuclear palsy) ([place]) 11/15/2019  . Gait disorder 11/15/2019  . Movement disorder 05/18/2019  . Dementia ([place]) 11/10/2018    Orientation RESPIRATION BLADDER Height & Weight     Self  Normal Incontinent Weight: 151 lb 0.2 oz (68.5 kg) Height:  5' 11.5" (181.6 cm)  BEHAVIORAL SYMPTOMS/MOOD NEUROLOGICAL BOWEL NUTRITION STATUS      Incontinent Diet (see discharge summary)  AMBULATORY STATUS COMMUNICATION OF NEEDS Skin   Extensive Assist Verbally Other (Comment) (Puncture abdoment right)                       Personal Care Assistance Level of Assistance  Bathing,Feeding,Dressing Bathing Assistance: Limited assistance Feeding assistance: Independent Dressing Assistance: Limited assistance     Functional Limitations Info  Sight,Hearing,Speech Sight  Info: Adequate Hearing Info: Adequate Speech Info: Adequate    SPECIAL CARE FACTORS FREQUENCY  PT (By licensed PT)     PT Frequency: 5x/week              Contractures Contractures Info: Not present    Additional Factors Info  Code Status,Allergies Code Status Info: DNR Allergies Info: NKA           Current Medications (10/09/2020):  This is the current hospital active medication list Current Facility-Administered Medications  Medication Dose Route Frequency Provider Last Rate Last Admin  . acetaminophen (TYLENOL) suppository 650 mg  650 mg Rectal Q6H PRN Johnson, Clanford L, MD       Or  . acetaminophen (TYLENOL) tablet 650 mg  650 mg Oral Q6H PRN Wynetta Emery, Clanford L, MD   650 mg at 10/08/20 2104  . amLODipine (NORVASC) tablet 10 mg  10 mg Oral Daily Elgergawy, Silver Huguenin, MD   10 mg at 10/08/20 2104  . benazepril (LOTENSIN) tablet 40 mg  40 mg Oral q AM Elgergawy, Silver Huguenin, MD   40 mg at 10/09/20 1021  . bisacodyl (DULCOLAX) suppository 10 mg  10 mg Rectal Q1200 Elgergawy, Silver Huguenin, MD   10 mg at 10/09/20 1239  . carbidopa-levodopa (SINEMET IR) 25-100 MG per tablet immediate release 1.5 tablet  1.5 tablet Oral TID Elgergawy, Silver Huguenin, MD   1.5 tablet at 10/09/20 1024  . Chlorhexidine Gluconate Cloth 2 % PADS 6 each  6 each Topical Daily Murlean Iba, MD  6 each at 10/09/20 1025  . cholecalciferol (VITAMIN D3) tablet 1,000 Units  1,000 Units Oral q AM Elgergawy, Silver Huguenin, MD   1,000 Units at 10/09/20 1023  . heparin injection 5,000 Units  5,000 Units Subcutaneous Q8H Monia Sabal, PA-C   5,000 Units at 10/09/20 1427  . hydrALAZINE (APRESOLINE) tablet 25 mg  25 mg Oral TID Reubin Milan, MD   25 mg at 10/09/20 1021  . linaclotide (LINZESS) capsule 290 mcg  290 mcg Oral QAC breakfast Annitta Needs, NP   290 mcg at 10/09/20 0601  . memantine (NAMENDA) tablet 10 mg  10 mg Oral Q lunch Elgergawy, Silver Huguenin, MD   10 mg at 10/09/20 1237  . ondansetron (ZOFRAN)  injection 4 mg  4 mg Intravenous Q6H PRN Elgergawy, Silver Huguenin, MD   4 mg at 10/06/20 1001     Discharge Medications: Please see discharge summary for a list of discharge medications.  Relevant Imaging Results:  Relevant Lab Results:   Additional Information SSN: [id number]. Pt has had COVID vaccines per daughter. Fall risk.  Shayli Altemose, Clydene Pugh, LCSW

## 2020-10-10 DIAGNOSIS — K769 Liver disease, unspecified: Secondary | ICD-10-CM | POA: Diagnosis not present

## 2020-10-10 DIAGNOSIS — F0390 Unspecified dementia without behavioral disturbance: Secondary | ICD-10-CM | POA: Diagnosis not present

## 2020-10-10 DIAGNOSIS — R933 Abnormal findings on diagnostic imaging of other parts of digestive tract: Secondary | ICD-10-CM | POA: Diagnosis not present

## 2020-10-10 DIAGNOSIS — K5900 Constipation, unspecified: Secondary | ICD-10-CM | POA: Diagnosis not present

## 2020-10-10 DIAGNOSIS — E871 Hypo-osmolality and hyponatremia: Secondary | ICD-10-CM | POA: Diagnosis not present

## 2020-10-10 LAB — BASIC METABOLIC PANEL
Anion gap: 11 (ref 5–15)
BUN: 15 mg/dL (ref 8–23)
CO2: 21 mmol/L — ABNORMAL LOW (ref 22–32)
Calcium: 8.3 mg/dL — ABNORMAL LOW (ref 8.9–10.3)
Chloride: 96 mmol/L — ABNORMAL LOW (ref 98–111)
Creatinine, Ser: 0.72 mg/dL (ref 0.61–1.24)
GFR, Estimated: >60 mL/min (ref >60)
Glucose, Bld: 88 mg/dL (ref 70–99)
Potassium: 3.8 mmol/L (ref 3.5–5.1)
Sodium: 128 mmol/L — ABNORMAL LOW (ref 135–145)

## 2020-10-10 LAB — CBC WITH DIFFERENTIAL/PLATELET
Abs Immature Granulocytes: 0.48 10*3/uL — ABNORMAL HIGH (ref 0.00–0.07)
Basophils Absolute: 0.1 10*3/uL (ref 0.0–0.1)
Basophils Relative: 1 %
Eosinophils Absolute: 0.1 10*3/uL (ref 0.0–0.5)
Eosinophils Relative: 1 %
HCT: 35 % — ABNORMAL LOW (ref 39.0–52.0)
Hemoglobin: 11.3 g/dL — ABNORMAL LOW (ref 13.0–17.0)
Immature Granulocytes: 5 %
Lymphocytes Relative: 5 %
Lymphs Abs: 0.6 10*3/uL — ABNORMAL LOW (ref 0.7–4.0)
MCH: 31.2 pg (ref 26.0–34.0)
MCHC: 32.3 g/dL (ref 30.0–36.0)
MCV: 96.7 fL (ref 80.0–100.0)
Monocytes Absolute: 0.8 10*3/uL (ref 0.1–1.0)
Monocytes Relative: 8 %
Neutro Abs: 8.4 10*3/uL — ABNORMAL HIGH (ref 1.7–7.7)
Neutrophils Relative %: 80 %
Platelets: 359 10*3/uL (ref 150–400)
RBC: 3.62 MIL/uL — ABNORMAL LOW (ref 4.22–5.81)
RDW: 13 % (ref 11.5–15.5)
WBC: 10.4 10*3/uL (ref 4.0–10.5)
nRBC: 0 % (ref 0.0–0.2)

## 2020-10-10 LAB — MAGNESIUM: Magnesium: 2 mg/dL (ref 1.7–2.4)

## 2020-10-10 LAB — CYTOLOGY - NON PAP

## 2020-10-10 MED ORDER — FINASTERIDE 5 MG PO TABS
5.0000 mg | ORAL_TABLET | Freq: Every day | ORAL | Status: DC
  Administered 2020-10-10 – 2020-10-11 (×2): 5 mg via ORAL
  Filled 2020-10-10 (×2): qty 1

## 2020-10-10 MED ORDER — MIRABEGRON ER 25 MG PO TB24
50.0000 mg | ORAL_TABLET | Freq: Every day | ORAL | Status: DC
  Administered 2020-10-10 – 2020-10-11 (×2): 50 mg via ORAL
  Filled 2020-10-10 (×2): qty 2

## 2020-10-10 NOTE — Progress Notes (Signed)
Physical Therapy Treatment Patient Details Name: Luis Freeman MRN: [id number] DOB: 10/23/1932 Today's Date: 10/10/2020    History of Present Illness Luis Freeman  is a 87 y.o. male, with medical history significant for dementia, progressive supranuclear palsy, gait abnormality.  History of fall in the past with subdural hematoma, neurogenic bladder, hypertension, B12 deficiency.  -Patient was brought to ED by his daughter, due to concerns of difficulty passing urine, and severe constipation, patient is total care currently, he is having 24-hour with her family or paid caregiver, patient with progressive weakness over last few days, has been less interactive as well, with difficulty passing urine, and no good bowel movement for few days, he saw his urologist yesterday, for dark-colored urine, family were told no UTI, no acute findings, per report patient mental status has decreased, he is not as responsive as usual, patient with dementia, cannot provide any history, it was provided by daughter.  - in ED patient had sodium of 121, usually it is in the upper 120s, CT abdomen pelvis significant for significant stool burden, and abnormal liver findings suspicious for malignancy versus abscess, [facility] hospitalist consulted to admit.    PT Comments    Patient presents on [facility] with nursing staff assisting to have bowel movement and tolerated standing with RW for up to 1 minute while being cleaned.  Patient leans backwards when attempting to stand with RW with poor return for tucking in bottom or locking knees when standing due to weakness.  Patient required stand pivot with knees blocked to transfer back to bed and tolerated sitting up at bedside for approximately 10-12 minutes while receiving AAROM exercises to BLE.  Patient will benefit from continued physical therapy in hospital and recommended venue below to increase strength, balance, endurance for safe ADLs and gait.    Follow Up Recommendations  SNF      Equipment Recommendations  None recommended by PT    Recommendations for Other Services       Precautions / Restrictions Precautions Precautions: Fall Restrictions Weight Bearing Restrictions: No    Mobility  Bed Mobility Overal bed mobility: Needs Assistance Bed Mobility: Sit to Supine       Sit to supine: Mod assist;Max assist   General bed mobility comments: increased time, labored movement, requires Max assist to move legs back onto bed    Transfers Overall transfer level: Needs assistance Equipment used: Rolling walker (2 wheeled) Transfers: Sit to/from [employer] Transfers Sit to Stand: Max assist Stand pivot transfers: Max assist       General transfer comment: Patient unable to transfer using RW due to leaning backwards and BLE weakness, required stand pivot with knees blocked to complete BSC/bed transfer  Ambulation/Gait                 Stairs             Wheelchair Mobility    Modified Rankin (Stroke Patients Only)       Balance Overall balance assessment: Needs assistance Sitting-balance support: Feet supported;No upper extremity supported Sitting balance-Leahy Scale: Poor Sitting balance - Comments: fair/poor with occasional leaning backwards and to the left Postural control: Posterior lean;Left lateral lean Standing balance support: During functional activity;Bilateral upper extremity supported Standing balance-Leahy Scale: Fair Standing balance comment: using RW                            Cognition Arousal/Alertness: Awake/alert Behavior During Therapy: Flat affect Overall  Cognitive Status: History of cognitive impairments - at baseline                                        Exercises General Exercises - Lower Extremity Long Arc Quad: Seated;AAROM;Strengthening;Both;15 reps Hip Flexion/Marching: Seated;AAROM;Both;Strengthening;15 reps Other Exercises Other Exercises: trunk control  with focus leaning forward and to the right with end range stretching x 5, 30 second holds    General Comments        Pertinent Vitals/Pain Pain Assessment: No/denies pain    Home Living                      Prior Function            PT Goals (current goals can now be found in the care plan section) Acute Rehab PT Goals Patient Stated Goal: return home with family to assist PT Goal Formulation: With patient/family Time For Goal Achievement: 10/21/20 Potential to Achieve Goals: Good Progress towards PT goals: Progressing toward goals    Frequency    Min 3X/week      PT Plan Current plan remains appropriate    Co-evaluation              AM-PAC PT "6 Clicks" Mobility   Outcome Measure  Help needed turning from your back to your side while in a flat bed without using bedrails?: A Lot Help needed moving from lying on your back to sitting on the side of a flat bed without using bedrails?: A Lot Help needed moving to and from a bed to a chair (including a wheelchair)?: A Lot Help needed standing up from a chair using your arms (e.g., wheelchair or bedside chair)?: A Lot Help needed to walk in hospital room?: Total Help needed climbing 3-5 steps with a railing? : Total 6 Click Score: 10    End of Session Equipment Utilized During Treatment: Gait belt Activity Tolerance: Patient tolerated treatment well Patient left: in bed;with call bell/phone within reach;with family/visitor present Nurse Communication: Mobility status PT Visit Diagnosis: Unsteadiness on feet (R26.81);Other abnormalities of gait and mobility (R26.89);Muscle weakness (generalized) (M62.81)     Time: 3903-0092 PT Time Calculation (min) (ACUTE ONLY): 23 min  Charges:  $Therapeutic Exercise: 8-22 mins $Therapeutic Activity: 8-22 mins                     12:34 PM, 10/10/20 Lonell Grandchild, MPT Physical Therapist with [facility] 336 [phone] office [phone] mobile  phone

## 2020-10-10 NOTE — Plan of Care (Signed)
  Problem: Education: Goal: Knowledge of General Education information will improve Description Including pain rating scale, medication(s)/side effects and non-pharmacologic comfort measures Outcome: Progressing   Problem: Health Behavior/Discharge Planning: Goal: Ability to manage health-related needs will improve Outcome: Progressing   

## 2020-10-10 NOTE — Clinical Social Work Note (Signed)
[employer] is not in network with [employer]. Daughter, [name], notified. Referred to [facility] and [facility]. Per [name], if patient is authorized and accepted at [facility], he can go for rehab then transition to [facility] for LTC. If patient does not receive authorization, patient will go to [place] as private pay.   Kayen Grabel, Clydene Pugh, LCSW

## 2020-10-10 NOTE — Progress Notes (Addendum)
Subjective:  Sitting up in bed, eating breakfast. Daughter, [name], at bedside. No signs of discomfort during the night. Had at least 7 stools in the last 24 hours.   Objective: Vital signs in last 24 hours: Temp:  [98 F (36.7 C)-98.7 F (37.1 C)] 98.7 F (37.1 C) (03/24 0439) Pulse Rate:  [87-98] 87 (03/24 0439) Resp:  [17-19] 19 (03/24 0439) BP: (113-135)/(59-61) 135/59 (03/24 0439) SpO2:  [95 %-98 %] 95 % (03/24 0439) Last BM Date: 10/09/20 General:   Alert,  Elderly male sitting up eating breakfast. Does not verbally communicate with me. NAD Head:  Normocephalic and atraumatic. Eyes:  Sclera clear, no icterus.  Abdomen:  Soft, normal bowel sounds. Some distention, nontender. No masses, hepatosplenomegaly or hernias noted. No guarding, and without rebound.   Extremities:  Without clubbing, deformity or edema. Neurologic:  Alert. Psych:  Alert. Otherwise could not evaluate.  Intake/Output from previous day: 03/23 0701 - 03/24 0700 In: 480 [P.O.:480] Out: 1601 [Urine:1601] Intake/Output this shift: No intake/output data recorded.  Lab Results: CBC Recent Labs    10/08/20 0537 10/09/20 0519 10/10/20 0441  WBC 11.0* 11.0* 10.4  HGB 11.2* 11.7* 11.3*  HCT 34.0* 36.0* 35.0*  MCV 96.9 97.0 96.7  PLT 340 342 359   BMET Recent Labs    10/08/20 0537 10/09/20 0519 10/10/20 0441  NA 129* 127* 128*  K 3.9 3.8 3.8  CL 97* 96* 96*  CO2 22 22 21*  GLUCOSE 87 95 88  BUN 19 17 15   CREATININE 0.68 0.65 0.72  CALCIUM 8.3* 8.5* 8.3*   LFTs No results for input(s): BILITOT, BILIDIR, IBILI, ALKPHOS, AST, ALT, PROT, ALBUMIN in the last 72 hours. No results for input(s): LIPASE in the last 72 hours. PT/INR Recent Labs    10/08/20 0537  LABPROT 13.5  INR 1.1      Imaging Studies: DG Chest 1 View  Result Date: 10/07/2020 CLINICAL DATA:  Status post right-sided thoracentesis EXAM: CHEST  1 VIEW COMPARISON:  1 day prior earlier today at 5:02 a.m. FINDINGS: Midline  trachea. Mild cardiomegaly. Layering small left pleural effusion is unchanged. Significant improvement to resolution in right-sided pleural fluid. Skin fold over the chest bilaterally, but no pneumothorax. Underlying mild interstitial edema. Left base airspace disease persists. IMPRESSION: Improved resolved right-sided pleural effusion, without pneumothorax. Persistent left pleural effusion, adjacent atelectasis, and interstitial edema. Electronically Signed   By: Abigail Miyamoto M.D.   On: 10/07/2020 10:20   DG Abd 1 View  Result Date: 10/04/2020 CLINICAL DATA:  Lack of bowel movement EXAM: ABDOMEN - 1 VIEW COMPARISON:  02/27/2020 FINDINGS: Prominent stool throughout the colon favors constipation. Thoracic and lumbar spondylosis. Likely chronic suspected lumbar compression fractures at L2, L4, and L5. Poor definition of the left hemidiaphragm, cannot exclude left basilar airspace opacity, consider chest radiography for further workup. Questionable blunting of the right lateral costophrenic angle. IMPRESSION: 1. Prominent stool throughout the colon favors constipation. 2. Poor definition of the left hemidiaphragm, cannot exclude left basilar airspace opacity, consider chest radiography for further workup. 3. Possible small right pleural effusion. 4. Likely chronic lumbar compression fractures at L2, L4, and L5. Electronically Signed   By: Van Clines M.D.   On: 10/04/2020 17:48   CT Head Wo Contrast  Result Date: 10/04/2020 CLINICAL DATA:  Abdominal distension with altered mental status EXAM: CT HEAD WITHOUT CONTRAST TECHNIQUE: Contiguous axial images were obtained from the base of the skull through the vertex without intravenous contrast. COMPARISON:  CT head  02/27/2020 FINDINGS: Brain: Periventricular white matter and corona radiata hypodensities favor chronic ischemic microvascular white matter disease. Otherwise, the brainstem, cerebellum, cerebral peduncles, thalamus, basal ganglia, basilar  cisterns, and ventricular system appear within normal limits. No intracranial hemorrhage, mass lesion, or acute CVA. Vascular: There is atherosclerotic calcification of the cavernous carotid arteries bilaterally. Skull: Unremarkable Sinuses/Orbits: Mild chronic ethmoid sinusitis. Other: No supplemental non-categorized findings. IMPRESSION: 1. No acute intracranial findings. 2. Periventricular white matter and corona radiata hypodensities favor chronic ischemic microvascular white matter disease. 3. Mild chronic ethmoid sinusitis. Electronically Signed   By: Van Clines M.D.   On: 10/04/2020 22:08   CT ABDOMEN PELVIS W CONTRAST  Result Date: 10/04/2020 CLINICAL DATA:  Abdominal distention and altered mental status, constipation. EXAM: CT ABDOMEN AND PELVIS WITH CONTRAST TECHNIQUE: Multidetector CT imaging of the abdomen and pelvis was performed using the standard protocol following bolus administration of intravenous contrast. CONTRAST:  170mL OMNIPAQUE IOHEXOL 300 MG/ML  SOLN COMPARISON:  Abdomen radiograph 02/27/2020 FINDINGS: Despite efforts by the technologist and patient, motion artifact is present on today's exam and could not be eliminated. This reduces exam sensitivity and specificity. Lower chest: Large bilateral pleural effusions with associated passive atelectasis. These effusions are nonspecific for transudative or exudative etiology. Left anterior descending and right coronary artery atherosclerosis along with descending thoracic aortic atherosclerosis. Small pericardial effusion. Hepatobiliary: Masslike process with marginal enhancement and central hypoenhancement/central necrosis centered in the left hepatic lobe, measuring 14.2 by 10.3 by 9.9 cm on image 51 series 5. Possibilities include malignancy or abscess. Linear lucency inferiorly in segment 3 of the liver could be from a prominent intrahepatic bile duct or possibly portal vein thrombosis of the associated portal vein tributary. The  main portal vein is patent. Gallbladder grossly unremarkable, somewhat blurred by motion artifact. Pancreas: Unremarkable Spleen: Unremarkable. Adrenals/Urinary Tract: Both adrenal glands appear normal. Simple appearing cysts of the right kidney. Urinary bladder unremarkable. Stomach/Bowel: Prominent stool throughout much of the colon, but not in the sigmoid colon or rectum. It is difficult to identify the site of transition due to the motion artifact in today's exam. However, it is probably just distal to the splenic flexure. Borderline rectal wall thickening, without surrounding adenopathy. Vascular/Lymphatic: Aortoiliac atherosclerotic vascular disease. Reproductive: Mild prostatomegaly. Other: Mild diffuse subcutaneous and mesenteric edema. Musculoskeletal: Remote compression fractures at L2, L4, and L5. Lumbar spondylosis and degenerative disc disease causing multilevel impingement. IMPRESSION: 1. Masslike process with marginal enhancement and central necrosis centered in the left hepatic lobe, measuring 14.2 by 10.3 by 9.9 cm (volume = 760 cm^3). Possibilities include malignancy or abscess. Correlate with 2. Linear lucency inferiorly in segment 3 of the liver could be from a dilated intrahepatic bile duct or possibly portal vein thrombosis of the associated portal vein tributary. 3. Large bilateral pleural effusions with associated passive atelectasis. These effusions are nonspecific for transudative or exudative etiology. 4. Prominent stool throughout much of the colon, but not in the sigmoid colon or rectum. Borderline rectal wall thickening, without surrounding adenopathy. 5. Other imaging findings of potential clinical significance: Coronary atherosclerosis. Small pericardial effusion. Remote compression fractures at L2, L4, and L5. Lumbar spondylosis and degenerative disc disease causing multilevel impingement. Mild prostatomegaly. 6. Despite efforts by the technologist and patient, motion artifact is  present on today's exam and could not be eliminated. This reduces exam sensitivity and specificity. 7. Aortic atherosclerosis. Aortic Atherosclerosis (ICD10-I70.0). Electronically Signed   By: Van Clines M.D.   On: 10/04/2020 22:05   US BIOPSY (LIVER)  Result Date: 10/08/2020 INDICATION: Indeterminate anterior large left liver mass EXAM: ULTRASOUND GUIDED CORE BIOPSY OF ANTERIOR LEFT LIVER MASS MEDICATIONS: 1% LIDOCAINE LOCAL ANESTHESIA/SEDATION: Moderate Sedation Time:  None. The patient was continuously monitored during the procedure by the interventional radiology nurse under my direct supervision. FLUOROSCOPY TIME:  Fluoroscopy Time: None. COMPLICATIONS: None immediate. PROCEDURE: The procedure, risks, benefits, and alternatives were explained to the patient. Questions regarding the procedure were encouraged and answered. The patient understands and consents to the procedure. Previous imaging reviewed. Preliminary ultrasound performed. The large anterior left hepatic mass was localized from a subcostal anterior approach. Under sterile conditions and local anesthesia, a 17 gauge 6.8 cm access was advanced into the lesion. Needle position confirmed with ultrasound. 18 gauge core biopsies obtained. These were intact and non fragmented. Samples placed in formalin. Needle tract occluded with Gel-Foam. Postprocedure imaging demonstrates no hemorrhage or hematoma. Patient tolerated biopsy well. FINDINGS: Imaging confirms needle placement in the anterior large left liver lesion for core biopsy IMPRESSION: Successful ultrasound anterior left hepatic mass 18 gauge core biopsy Electronically Signed   By: Jerilynn Mages.  Shick M.D.   On: 10/08/2020 11:44   DG CHEST PORT 1 VIEW  Result Date: 10/07/2020 CLINICAL DATA:  Pleural effusion EXAM: PORTABLE CHEST 1 VIEW COMPARISON:  October 04, 2020 FINDINGS: Pleural effusions again noted bilaterally, layering. There is persistent airspace opacity in the medial left base.  Bilateral apical pleural thickening is stable. No new opacity. Heart is upper normal in size with pulmonary vascularity normal. No adenopathy. There is aortic atherosclerosis. Bones are osteoporotic. IMPRESSION: Layering pleural effusions bilaterally. Airspace opacity left lower lobe which may represent atelectasis with potential underlying pneumonia. No new opacity. Stable cardiac silhouette. Aortic Atherosclerosis (ICD10-I70.0). Electronically Signed   By: Lowella Grip III M.D.   On: 10/07/2020 07:59   DG Chest Port 1 View  Result Date: 10/04/2020 CLINICAL DATA:  Weakness EXAM: PORTABLE CHEST 1 VIEW COMPARISON:  10/20/2015 FINDINGS: Bilateral layering pleural effusions. Bilateral lower lobe airspace opacities. Heart is normal size. Aortic atherosclerosis. No acute bony abnormality. IMPRESSION: Bilateral layering effusions with bibasilar atelectasis or infiltrates. Electronically Signed   By: Rolm Baptise M.D.   On: 10/04/2020 19:45   DG BE (COLON)W SINGLE CM (SOL OR THIN BA)  Result Date: 10/07/2020 CLINICAL DATA:  Constipation with lack of bowel movements for 8 days. Hepatic mass on CT. EXAM: WATER SOLUBLE CONTRAST ENEMA TECHNIQUE: Single-contrast non prepped barium enema was performed for both therapeutic and attempted diagnostic purposes. FLUOROSCOPY TIME:  Fluoroscopy Time:  3 minutes and 30 seconds Radiation Exposure Index (if provided by the fluoroscopic device): 4 Number of Acquired Spot Images: Not applicable. COMPARISON:  Abdominopelvic CT of 10/04/2020. FINDINGS: Preprocedure scout film demonstrates a large amount of colonic stool. No bowel obstruction. Focused, limited single-contrast exam performed with the patient in a left lateral and supine position. Limitations secondary to patient's age and clinical status. The rectum and sigmoid are grossly normal, without circumferential narrowing. From the sigmoid proximally, the colon is stool filled. Contrast filled to approximately the level of  the mid transverse colon. IMPRESSION: 1. Focused therapeutic and less so diagnostic non prepped barium enema performed. 2. No evidence of circumferential or obstructive mass, especially within the rectum or sigmoid. Electronically Signed   By: Abigail Miyamoto M.D.   On: 10/07/2020 11:46   US THORACENTESIS ASP PLEURAL SPACE W/IMG GUIDE  Result Date: 10/07/2020 INDICATION: Bilateral pleural fluid. EXAM: ULTRASOUND GUIDED RIGHT filled THORACENTESIS MEDICATIONS: None. COMPLICATIONS: None immediate. PROCEDURE: An  ultrasound guided thoracentesis was thoroughly discussed with the patient and the patient's daughter and questions answered. The benefits, risks, alternatives and complications were also discussed. The patient understands and wishes to proceed with the procedure. Written consent was obtained. Ultrasound was performed to localize and mark an adequate pocket of fluid in the right chest. The area was then prepped and draped in the normal sterile fashion. 1% Lidocaine was used for local anesthesia. Under ultrasound guidance a 19 gauge, 7-cm, Yueh catheter was introduced. Thoracentesis was performed. The catheter was removed and a dressing applied. FINDINGS: A total of approximately 1.2 of yellow fluid was removed. Samples were sent to the laboratory as requested by the clinical team. IMPRESSION: Successful ultrasound guided right thoracentesis yielding 1.2 L of pleural fluid. Electronically Signed   By: Abigail Miyamoto M.D.   On: 10/07/2020 10:22  [2 weeks]   Assessment:  87 year old male with history of dementia, progressive supranuclear palsy, h/o fall with subdural hematoma 01/2020, admitted with progressive mental status decline, acute on chronic hyponatremia, bowel and bladder dysfunction with progressive constipation.   Constipation: felt to be dealing more with colonic pseudoobstruction. Unable to tolerate GoLYTELY purge.  Gastrografin enema completed March 21 for diagnostic and therapeutic purposes,  unprepped study.  Rectum and sigmoid grossly normal, and from the sigmoid proximally the colon was stool-filled.  Started on Linzess 290 mcg.  Has had multiple large BMs since Gastrografin enema and Linzess started. Abdomen with some distention but very soft and normal bowel sounds.   Hyponatremia: acute on chronic finding. Improved this admission. Na 121-->128 today.  Liver lesion: Incidentally noted on CT.  LFTs normal.  AFP normal.  Liver biopsy completed, preliminary pathology revealed spindle cells but immunohistochemical stains pending. Spoke with Dr. Laural Golden, pathologist is inquiring about any prior history of melanoma. Patient had melanoma removed from scalp in 2016 with no adjuvant treatment. Patient also noted to have "Linear lucency inferiorly in segment 3 of the liver could be from a prominent intrahepatic bile duct or possibly portal vein thrombosis of the associated portal vein tributary. The main portal vein is patent." on CT. Discussed with Dr. Laural Golden. If clot present, patient is not candidate for long-term anticoagulation or other aggressive therapy.   Bilateral pleural effusions, required thoracentesis this admission.  Plan: 1. Follow-up liver biopsy results. 2. Continue Linzess 290 mcg daily. May have to adjust dose based on ongoing response. Discussed with daughter.  3. Appreciate palliative care input.   Laureen Ochs. Bernarda Caffey [facility] [phone] 3/24/202210:52 AM     LOS: 6 days

## 2020-10-10 NOTE — Progress Notes (Signed)
Patient Demographics:    Luis Freeman, is a 87 y.o. male, DOB - 02-02-1933, TMH:[id number]  Admit date - 10/04/2020   Admitting Physician Albertine Patricia, MD  Outpatient Primary MD for the patient is [name], MD  LOS - 6   Chief Complaint  Patient presents with  . Weakness        Subjective:    Luis Freeman today has no fevers, no emesis,  No chest pain,    -Trying to work with PT, gait concerns persist -Patient's wife and daughter are [name] at bedside -Had [facility]  Assessment  & Plan :    Active Problems:   Acute on Chronic HypoNatremia   Gait disorder   Dementia (HCC)   Movement disorder   HTN (hypertension)   Constipation   Liver lesion   Bilateral pleural effusion   Status post thoracentesis   Brief Summary:- 86 y.o.malewith medical history significant forprogressive supranuclear palsy with significant gait and speech problems, HTN and history of B12 deficiency, 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, neurogenic bladder, neurogenic bowel with severe constipation and recurrent episodes of hyponatremia admitted on 10/04/20 with acute metabolic encephalopathy, worsening hyponatremia constipation as well as anterior central liver mass  and bilateral pleural effusions and abnormal CT abdomen and pelvis findings  A/p 1)Anterior Central Liver Mass  and bilateral pleural effusions-- s/p 18 g core bxs x 3 of the anterior central liver mass and post right-sided thoracentesis on 10/08/2020 --Pathology/cytology pending -AFP 3.1  2)Ambulatory dysfunction/unsteady gait/recurrent falls----PT eval appreciated recommends SNF rehab,  --Patient is very unsteady more at risk of falling and requires SNF rehab to return to his pre- admission level of functioning ---Gait concerns persist,  -Patient with poor safety awareness and high risk for falling -Patient and family are agreeable to SNF  placement --Awaiting SNF bed availability as well as insurance approval for SNF rehab  3)Progressive Supranuclear Palsy--- with significant gait, balance and ambulatory concerns as well as speech concerns -Patient also has some cognitive decline --I attempted to reach patient's neurologist Dr Regis Bill at [facility] movement disorder/neurology clinic-- at [phone] and  [phone] left voicemails x2 --Continue Sinemet and Namenda  4)HTN--- c/n amlodipine and benazepril 40 mg daily and hydralazine 25 mg 3 times daily  5) H/o B12 deficiency----previously received B12 shots, repeat serum B12 on 10/06/2019 is 4163  6)HypoNatremia--acute on chronic symptomatic hyponatremia----POA-- -sodium currently in the high 120s -Consider sodium chloride tablets  --Patient is to avoid excessive free water  7)Acute on chronic chronic urinary retention--- neurogenic bladder in the setting of progressive supranuclear palsy decision was made not to use Flomax due to patient's history of severe orthostatic hypotension/syncope ---Foley in situ, follow-up with Dr. Louis Meckel his urologist at [facility] -??  Finasteride/Proscar is an option -Currently on Myrbetriq which may be less effective from a prostate standpoint -urine culture negative  8)Severe Constipation in the setting of neurogenic bowel secondary to progressive supranuclear palsy - Appreciate GI consult and orders for gastrograffin enema on 3/21 - Good results from gastrograffin enema -c/n  Linzess daily   9)Social/Ethics--patient is a DNR, previously completed MOST form and HCPOA -Patient gets a lot of support and help from his 4 daughters and his wife, --- they do have caregivers  to help patient at home - Disposition/Need for in-Hospital Stay- patient unable to be discharged at this time due to -awaiting transfer to SNF rehab, unsafe discharge plan at this time ---Awaiting SNF bed availability as well as insurance approval for SNF  rehab  Status is: Inpatient  Remains inpatient appropriate because:Please see above   Disposition: The patient is from: Home              Anticipated d/c is to: SNF              Anticipated d/c date is: 1 day              Patient currently is medically stable to d/c. Barriers: Awaiting SNF placement  Code Status :  -  Code Status: DNR   Family Communication:    (patient is alert, awake and coherent)  Discussed with daughter [name]  Consults  :  Gi/Palliative  DVT Prophylaxis  :   - SCDs   heparin injection 5,000 Units Start: 10/09/20 0600 SCDs Start: 10/04/20 2313    Lab Results  Component Value Date   PLT 359 10/10/2020    Inpatient Medications  Scheduled Meds: . amLODipine  10 mg Oral Daily  . benazepril  40 mg Oral q AM  . bisacodyl  10 mg Rectal Q1200  . carbidopa-levodopa  1.5 tablet Oral TID  . Chlorhexidine Gluconate Cloth  6 each Topical Daily  . cholecalciferol  1,000 Units Oral q AM  . heparin  5,000 Units Subcutaneous Q8H  . hydrALAZINE  25 mg Oral TID  . linaclotide  290 mcg Oral QAC breakfast  . memantine  10 mg Oral Q lunch  . mirabegron ER  50 mg Oral Daily   Continuous Infusions: PRN Meds:.acetaminophen **OR** acetaminophen, ondansetron (ZOFRAN) IV    Anti-infectives (From admission, onward)   None        Objective:   Vitals:   10/08/20 2021 10/09/20 0515 10/09/20 2001 10/10/20 0439  BP: 128/66 138/62 116/61 (!) 135/59  Pulse: 98 83 98 87  Resp: 20 16 19 19   Temp: 98.2 F (36.8 C) 98.4 F (36.9 C) 98 F (36.7 C) 98.7 F (37.1 C)  TempSrc: Oral Oral    SpO2: 95% 96% 98% 95%  Weight:      Height:        Wt Readings from Last 3 Encounters:  10/04/20 68.5 kg  09/12/20 68.5 kg  08/19/20 67.9 kg     Intake/Output Summary (Last 24 hours) at 10/10/2020 1546 Last data filed at 10/10/2020 0900 Gross per 24 hour  Intake 480 ml  Output 951 ml  Net -471 ml     Physical Exam  Gen:- Awake Alert,  In no apparent distress   HEENT:- [place].AT, No sclera icterus Neck-Supple Neck,No JVD,.  Lungs-  CTAB , fair symmetrical air movement CV- S1, S2 normal, regular  Abd-  +ve B.Sounds, Abd Soft, No tenderness,    Extremity/Skin:- No  edema, pedal pulses present  Psych-baseline cognitive and memory deficits , not very verbal neuro-very unsteady gait, stiffness, no additional new focal deficits, no tremors   Data Review:   Micro Results Recent Results (from the past 240 hour(s))  SARS CORONAVIRUS 2 (TAT 6-24 HRS) Nasopharyngeal Nasopharyngeal Swab     Status: None   Collection Time: 10/04/20 10:00 PM   Specimen: Nasopharyngeal Swab  Result Value Ref Range Status   SARS Coronavirus 2 NEGATIVE NEGATIVE Final    Comment: (NOTE) SARS-CoV-2 target nucleic acids are  NOT DETECTED.  The SARS-CoV-2 RNA is generally detectable in upper and lower respiratory specimens during the acute phase of infection. Negative results do not preclude SARS-CoV-2 infection, do not rule out co-infections with other pathogens, and should not be used as the sole basis for treatment or other patient management decisions. Negative results must be combined with clinical observations, patient history, and epidemiological information. The expected result is Negative.  Fact Sheet for Patients: SugarRoll.be  Fact Sheet for Healthcare Providers: https://www.woods-mathews.com/  This test is not yet approved or cleared by the [place] FDA and  has been authorized for detection and/or diagnosis of SARS-CoV-2 by FDA under an Emergency Use Authorization (EUA). This EUA will remain  in effect (meaning this test can be used) for the duration of the COVID-19 declaration under Se ction 564(b)(1) of the Act, 21 U.S.C. section 360bbb-3(b)(1), unless the authorization is terminated or revoked sooner.  Performed at [facility], [place] [address], [place], [place] [address]   Urine Culture     Status:  None   Collection Time: 10/04/20 10:41 PM   Specimen: Urine, Clean Catch  Result Value Ref Range Status   Specimen Description   Final    URINE, CLEAN CATCH Performed at [facility], [address], [place], [place] [address]    Special Requests   Final    NONE Performed at [facility], [address] [place], [place] [address]    Culture   Final    NO GROWTH Performed at [facility], [place] [address] [place], [place] [address]    Report Status 10/06/2020 FINAL  Final  Culture, body fluid w Gram Stain-bottle     Status: None (Preliminary result)   Collection Time: 10/07/20  9:45 AM   Specimen: Pleura  Result Value Ref Range Status   Specimen Description PLEURAL  Final   Special Requests 10CC  Final   Culture   Final    NO GROWTH 3 DAYS Performed at [facility], [address], [place], [place] [address]    Report Status PENDING  Incomplete  Gram stain     Status: None   Collection Time: 10/07/20  9:45 AM   Specimen: Pleura  Result Value Ref Range Status   Specimen Description PLEURAL  Final   Special Requests NONE  Final   Gram Stain   Final    NO ORGANISMS SEEN WBC PRESENT,BOTH PMN AND MONONUCLEAR Performed at [facility], [address] [place], [place] [address]    Report Status 10/07/2020 FINAL  Final  Fungus Culture With Stain     Status: None (Preliminary result)   Collection Time: 10/07/20  9:47 AM  Result Value Ref Range Status   Fungus Stain Final report  Final    Comment: (NOTE) Performed At: [facility] [address] [place], [place] [id number] Rush Farmer MD [id number]    Fungus (Mycology) Culture PENDING  Incomplete   Fungal Source PLEURAL  Final    Comment: Performed at [facility], [address], [place],  [address]  Fungus Culture Result     Status: None   Collection Time: 10/07/20  9:47 AM  Result Value Ref Range Status   Result 1 Comment  Final    Comment: (NOTE) KOH/Calcofluor preparation:  no fungus  observed. Performed At: [facility] [place], [place] [id number] Rush Farmer MD [id number]     Radiology Reports DG Chest 1 View  Result Date: 10/07/2020 CLINICAL DATA:  Status  post right-sided thoracentesis EXAM: CHEST  1 VIEW COMPARISON:  1 day prior earlier today at 5:02 a.m. FINDINGS: Midline trachea. Mild cardiomegaly. Layering small left pleural effusion is unchanged. Significant improvement to resolution in right-sided pleural fluid. Skin fold over the chest bilaterally, but no pneumothorax. Underlying mild interstitial edema. Left base airspace disease persists. IMPRESSION: Improved resolved right-sided pleural effusion, without pneumothorax. Persistent left pleural effusion, adjacent atelectasis, and interstitial edema. Electronically Signed   By: Abigail Miyamoto M.D.   On: 10/07/2020 10:20   DG Abd 1 View  Result Date: 10/04/2020 CLINICAL DATA:  Lack of bowel movement EXAM: ABDOMEN - 1 VIEW COMPARISON:  02/27/2020 FINDINGS: Prominent stool throughout the colon favors constipation. Thoracic and lumbar spondylosis. Likely chronic suspected lumbar compression fractures at L2, L4, and L5. Poor definition of the left hemidiaphragm, cannot exclude left basilar airspace opacity, consider chest radiography for further workup. Questionable blunting of the right lateral costophrenic angle. IMPRESSION: 1. Prominent stool throughout the colon favors constipation. 2. Poor definition of the left hemidiaphragm, cannot exclude left basilar airspace opacity, consider chest radiography for further workup. 3. Possible small right pleural effusion. 4. Likely chronic lumbar compression fractures at L2, L4, and L5. Electronically Signed   By: Van Clines M.D.   On: 10/04/2020 17:48   CT Head Wo Contrast  Result Date: 10/04/2020 CLINICAL DATA:  Abdominal distension with altered mental status EXAM: CT HEAD WITHOUT CONTRAST TECHNIQUE: Contiguous axial images were obtained from the  base of the skull through the vertex without intravenous contrast. COMPARISON:  CT head 02/27/2020 FINDINGS: Brain: Periventricular white matter and corona radiata hypodensities favor chronic ischemic microvascular white matter disease. Otherwise, the brainstem, cerebellum, cerebral peduncles, thalamus, basal ganglia, basilar cisterns, and ventricular system appear within normal limits. No intracranial hemorrhage, mass lesion, or acute CVA. Vascular: There is atherosclerotic calcification of the cavernous carotid arteries bilaterally. Skull: Unremarkable Sinuses/Orbits: Mild chronic ethmoid sinusitis. Other: No supplemental non-categorized findings. IMPRESSION: 1. No acute intracranial findings. 2. Periventricular white matter and corona radiata hypodensities favor chronic ischemic microvascular white matter disease. 3. Mild chronic ethmoid sinusitis. Electronically Signed   By: Van Clines M.D.   On: 10/04/2020 22:08   CT ABDOMEN PELVIS W CONTRAST  Result Date: 10/04/2020 CLINICAL DATA:  Abdominal distention and altered mental status, constipation. EXAM: CT ABDOMEN AND PELVIS WITH CONTRAST TECHNIQUE: Multidetector CT imaging of the abdomen and pelvis was performed using the standard protocol following bolus administration of intravenous contrast. CONTRAST:  122mL OMNIPAQUE IOHEXOL 300 MG/ML  SOLN COMPARISON:  Abdomen radiograph 02/27/2020 FINDINGS: Despite efforts by the technologist and patient, motion artifact is present on today's exam and could not be eliminated. This reduces exam sensitivity and specificity. Lower chest: Large bilateral pleural effusions with associated passive atelectasis. These effusions are nonspecific for transudative or exudative etiology. Left anterior descending and right coronary artery atherosclerosis along with descending thoracic aortic atherosclerosis. Small pericardial effusion. Hepatobiliary: Masslike process with marginal enhancement and central  hypoenhancement/central necrosis centered in the left hepatic lobe, measuring 14.2 by 10.3 by 9.9 cm on image 51 series 5. Possibilities include malignancy or abscess. Linear lucency inferiorly in segment 3 of the liver could be from a prominent intrahepatic bile duct or possibly portal vein thrombosis of the associated portal vein tributary. The main portal vein is patent. Gallbladder grossly unremarkable, somewhat blurred by motion artifact. Pancreas: Unremarkable Spleen: Unremarkable. Adrenals/Urinary Tract: Both adrenal glands appear normal. Simple appearing cysts of the right kidney. Urinary bladder unremarkable. Stomach/Bowel: Prominent stool throughout much  of the colon, but not in the sigmoid colon or rectum. It is difficult to identify the site of transition due to the motion artifact in today's exam. However, it is probably just distal to the splenic flexure. Borderline rectal wall thickening, without surrounding adenopathy. Vascular/Lymphatic: Aortoiliac atherosclerotic vascular disease. Reproductive: Mild prostatomegaly. Other: Mild diffuse subcutaneous and mesenteric edema. Musculoskeletal: Remote compression fractures at L2, L4, and L5. Lumbar spondylosis and degenerative disc disease causing multilevel impingement. IMPRESSION: 1. Masslike process with marginal enhancement and central necrosis centered in the left hepatic lobe, measuring 14.2 by 10.3 by 9.9 cm (volume = 760 cm^3). Possibilities include malignancy or abscess. Correlate with 2. Linear lucency inferiorly in segment 3 of the liver could be from a dilated intrahepatic bile duct or possibly portal vein thrombosis of the associated portal vein tributary. 3. Large bilateral pleural effusions with associated passive atelectasis. These effusions are nonspecific for transudative or exudative etiology. 4. Prominent stool throughout much of the colon, but not in the sigmoid colon or rectum. Borderline rectal wall thickening, without surrounding  adenopathy. 5. Other imaging findings of potential clinical significance: Coronary atherosclerosis. Small pericardial effusion. Remote compression fractures at L2, L4, and L5. Lumbar spondylosis and degenerative disc disease causing multilevel impingement. Mild prostatomegaly. 6. Despite efforts by the technologist and patient, motion artifact is present on today's exam and could not be eliminated. This reduces exam sensitivity and specificity. 7. Aortic atherosclerosis. Aortic Atherosclerosis (ICD10-I70.0). Electronically Signed   By: Van Clines M.D.   On: 10/04/2020 22:05   US BIOPSY (LIVER)  Result Date: 10/08/2020 INDICATION: Indeterminate anterior large left liver mass EXAM: ULTRASOUND GUIDED CORE BIOPSY OF ANTERIOR LEFT LIVER MASS MEDICATIONS: 1% LIDOCAINE LOCAL ANESTHESIA/SEDATION: Moderate Sedation Time:  None. The patient was continuously monitored during the procedure by the interventional radiology nurse under my direct supervision. FLUOROSCOPY TIME:  Fluoroscopy Time: None. COMPLICATIONS: None immediate. PROCEDURE: The procedure, risks, benefits, and alternatives were explained to the patient. Questions regarding the procedure were encouraged and answered. The patient understands and consents to the procedure. Previous imaging reviewed. Preliminary ultrasound performed. The large anterior left hepatic mass was localized from a subcostal anterior approach. Under sterile conditions and local anesthesia, a 17 gauge 6.8 cm access was advanced into the lesion. Needle position confirmed with ultrasound. 18 gauge core biopsies obtained. These were intact and non fragmented. Samples placed in formalin. Needle tract occluded with Gel-Foam. Postprocedure imaging demonstrates no hemorrhage or hematoma. Patient tolerated biopsy well. FINDINGS: Imaging confirms needle placement in the anterior large left liver lesion for core biopsy IMPRESSION: Successful ultrasound anterior left hepatic mass 18 gauge  core biopsy Electronically Signed   By: Jerilynn Mages.  Shick M.D.   On: 10/08/2020 11:44   DG CHEST PORT 1 VIEW  Result Date: 10/07/2020 CLINICAL DATA:  Pleural effusion EXAM: PORTABLE CHEST 1 VIEW COMPARISON:  October 04, 2020 FINDINGS: Pleural effusions again noted bilaterally, layering. There is persistent airspace opacity in the medial left base. Bilateral apical pleural thickening is stable. No new opacity. Heart is upper normal in size with pulmonary vascularity normal. No adenopathy. There is aortic atherosclerosis. Bones are osteoporotic. IMPRESSION: Layering pleural effusions bilaterally. Airspace opacity left lower lobe which may represent atelectasis with potential underlying pneumonia. No new opacity. Stable cardiac silhouette. Aortic Atherosclerosis (ICD10-I70.0). Electronically Signed   By: Lowella Grip III M.D.   On: 10/07/2020 07:59   DG Chest Port 1 View  Result Date: 10/04/2020 CLINICAL DATA:  Weakness EXAM: PORTABLE CHEST 1 VIEW COMPARISON:  10/20/2015  FINDINGS: Bilateral layering pleural effusions. Bilateral lower lobe airspace opacities. Heart is normal size. Aortic atherosclerosis. No acute bony abnormality. IMPRESSION: Bilateral layering effusions with bibasilar atelectasis or infiltrates. Electronically Signed   By: Rolm Baptise M.D.   On: 10/04/2020 19:45   DG BE (COLON)W SINGLE CM (SOL OR THIN BA)  Result Date: 10/07/2020 CLINICAL DATA:  Constipation with lack of bowel movements for 8 days. Hepatic mass on CT. EXAM: WATER SOLUBLE CONTRAST ENEMA TECHNIQUE: Single-contrast non prepped barium enema was performed for both therapeutic and attempted diagnostic purposes. FLUOROSCOPY TIME:  Fluoroscopy Time:  3 minutes and 30 seconds Radiation Exposure Index (if provided by the fluoroscopic device): 4 Number of Acquired Spot Images: Not applicable. COMPARISON:  Abdominopelvic CT of 10/04/2020. FINDINGS: Preprocedure scout film demonstrates a large amount of colonic stool. No bowel obstruction.  Focused, limited single-contrast exam performed with the patient in a left lateral and supine position. Limitations secondary to patient's age and clinical status. The rectum and sigmoid are grossly normal, without circumferential narrowing. From the sigmoid proximally, the colon is stool filled. Contrast filled to approximately the level of the mid transverse colon. IMPRESSION: 1. Focused therapeutic and less so diagnostic non prepped barium enema performed. 2. No evidence of circumferential or obstructive mass, especially within the rectum or sigmoid. Electronically Signed   By: Abigail Miyamoto M.D.   On: 10/07/2020 11:46   US THORACENTESIS ASP PLEURAL SPACE W/IMG GUIDE  Result Date: 10/07/2020 INDICATION: Bilateral pleural fluid. EXAM: ULTRASOUND GUIDED RIGHT filled THORACENTESIS MEDICATIONS: None. COMPLICATIONS: None immediate. PROCEDURE: An ultrasound guided thoracentesis was thoroughly discussed with the patient and the patient's daughter and questions answered. The benefits, risks, alternatives and complications were also discussed. The patient understands and wishes to proceed with the procedure. Written consent was obtained. Ultrasound was performed to localize and mark an adequate pocket of fluid in the right chest. The area was then prepped and draped in the normal sterile fashion. 1% Lidocaine was used for local anesthesia. Under ultrasound guidance a 19 gauge, 7-cm, Yueh catheter was introduced. Thoracentesis was performed. The catheter was removed and a dressing applied. FINDINGS: A total of approximately 1.2 of yellow fluid was removed. Samples were sent to the laboratory as requested by the clinical team. IMPRESSION: Successful ultrasound guided right thoracentesis yielding 1.2 L of pleural fluid. Electronically Signed   By: Abigail Miyamoto M.D.   On: 10/07/2020 10:22     CBC Recent Labs  Lab 10/06/20 0451 10/07/20 0441 10/08/20 0537 10/09/20 0519 10/10/20 0441  WBC 13.9* 11.2* 11.0* 11.0*  10.4  HGB 12.3* 11.1* 11.2* 11.7* 11.3*  HCT 37.6* 34.7* 34.0* 36.0* 35.0*  PLT 356 339 340 342 359  MCV 96.2 96.9 96.9 97.0 96.7  MCH 31.5 31.0 31.9 31.5 31.2  MCHC 32.7 32.0 32.9 32.5 32.3  RDW 12.7 12.7 12.9 13.0 13.0  LYMPHSABS 0.4* 0.6* 0.6* 0.6* 0.6*  MONOABS 0.9 1.0 0.9 0.9 0.8  EOSABS 0.0 0.1 0.1 0.1 0.1  BASOSABS 0.0 0.0 0.0 0.1 0.1    Chemistries  Recent Labs  Lab 10/04/20 2017 10/05/20 0535 10/06/20 0451 10/07/20 0441 10/08/20 0537 10/09/20 0519 10/10/20 0441  NA 121* 125* 127* 127* 129* 127* 128*  K 4.2 3.7 4.3 4.3 3.9 3.8 3.8  CL 90* 96* 96* 97* 97* 96* 96*  CO2 22 20* 21* 21* 22 22 21*  GLUCOSE 104* 93 102* 89 87 95 88  BUN 18 14 17 19 19 17 15   CREATININE 0.77 0.61 0.68 0.74 0.68  0.65 0.72  CALCIUM 8.5* 8.4* 8.6* 8.5* 8.3* 8.5* 8.3*  MG  --   --  2.0 2.0 2.0 2.1 2.0  AST 29 21  --   --   --   --   --   ALT 8 7  --   --   --   --   --   ALKPHOS 134* 113  --   --   --   --   --   BILITOT 0.6 0.6  --   --   --   --   --    ------------------------------------------------------------------------------------------------------------------ No results for input(s): CHOL, HDL, LDLCALC, TRIG, CHOLHDL, LDLDIRECT in the last 72 hours.  No results found for: HGBA1C ------------------------------------------------------------------------------------------------------------------ No results for input(s): TSH, T4TOTAL, T3FREE, THYROIDAB in the last 72 hours.  Invalid input(s): FREET3 ------------------------------------------------------------------------------------------------------------------ No results for input(s): VITAMINB12, FOLATE, FERRITIN, TIBC, IRON, RETICCTPCT in the last 72 hours.  Coagulation profile Recent Labs  Lab 10/08/20 0537  INR 1.1    No results for input(s): DDIMER in the last 72 hours.  Cardiac Enzymes No results for input(s): CKMB, TROPONINI, MYOGLOBIN in the last 168 hours.  Invalid input(s):  CK ------------------------------------------------------------------------------------------------------------------    Component Value Date/Time   BNP 93.0 10/04/2020 2000   Courage Emokpae M.D on 10/10/2020 at 3:46 PM  Go to [email or web address] - for contact info  [facility] - Office  [phone]

## 2020-10-10 NOTE — Progress Notes (Signed)
Patient's daughter called NS and left the info about his neurologist he sees - Dr. Hilton Sinclair at [facility] [phone]. daughter wanted [place] to pass along her contact information for provider. Notified Dr. Joesph Fillers.

## 2020-10-11 ENCOUNTER — Inpatient Hospital Stay: Payer: Self-pay

## 2020-10-11 ENCOUNTER — Inpatient Hospital Stay
Admission: RE | Admit: 2020-10-11 | Discharge: 2021-01-17 | Disposition: E | Payer: Medicare PPO | Source: Ambulatory Visit | Attending: Internal Medicine | Admitting: Internal Medicine

## 2020-10-11 DIAGNOSIS — C439 Malignant melanoma of skin, unspecified: Secondary | ICD-10-CM | POA: Diagnosis not present

## 2020-10-11 DIAGNOSIS — C787 Secondary malignant neoplasm of liver and intrahepatic bile duct: Secondary | ICD-10-CM | POA: Diagnosis present

## 2020-10-11 DIAGNOSIS — K5904 Chronic idiopathic constipation: Secondary | ICD-10-CM | POA: Diagnosis not present

## 2020-10-11 DIAGNOSIS — Z9889 Other specified postprocedural states: Secondary | ICD-10-CM | POA: Diagnosis not present

## 2020-10-11 DIAGNOSIS — I700 Atherosclerosis of aorta: Secondary | ICD-10-CM | POA: Diagnosis not present

## 2020-10-11 DIAGNOSIS — J948 Other specified pleural conditions: Secondary | ICD-10-CM | POA: Diagnosis not present

## 2020-10-11 DIAGNOSIS — G319 Degenerative disease of nervous system, unspecified: Secondary | ICD-10-CM | POA: Diagnosis not present

## 2020-10-11 DIAGNOSIS — E871 Hypo-osmolality and hyponatremia: Secondary | ICD-10-CM | POA: Diagnosis not present

## 2020-10-11 DIAGNOSIS — C434 Malignant melanoma of scalp and neck: Secondary | ICD-10-CM | POA: Diagnosis present

## 2020-10-11 DIAGNOSIS — Z801 Family history of malignant neoplasm of trachea, bronchus and lung: Secondary | ICD-10-CM | POA: Diagnosis not present

## 2020-10-11 DIAGNOSIS — J910 Malignant pleural effusion: Secondary | ICD-10-CM | POA: Diagnosis not present

## 2020-10-11 DIAGNOSIS — N401 Enlarged prostate with lower urinary tract symptoms: Secondary | ICD-10-CM | POA: Diagnosis not present

## 2020-10-11 DIAGNOSIS — R160 Hepatomegaly, not elsewhere classified: Secondary | ICD-10-CM | POA: Diagnosis not present

## 2020-10-11 DIAGNOSIS — E876 Hypokalemia: Secondary | ICD-10-CM | POA: Diagnosis not present

## 2020-10-11 DIAGNOSIS — F0390 Unspecified dementia without behavioral disturbance: Secondary | ICD-10-CM | POA: Diagnosis not present

## 2020-10-11 DIAGNOSIS — R269 Unspecified abnormalities of gait and mobility: Secondary | ICD-10-CM | POA: Diagnosis not present

## 2020-10-11 DIAGNOSIS — J90 Pleural effusion, not elsewhere classified: Secondary | ICD-10-CM | POA: Diagnosis not present

## 2020-10-11 DIAGNOSIS — Z800 Family history of malignant neoplasm of digestive organs: Secondary | ICD-10-CM | POA: Diagnosis not present

## 2020-10-11 DIAGNOSIS — I10 Essential (primary) hypertension: Secondary | ICD-10-CM | POA: Diagnosis not present

## 2020-10-11 DIAGNOSIS — K769 Liver disease, unspecified: Secondary | ICD-10-CM | POA: Diagnosis not present

## 2020-10-11 DIAGNOSIS — G231 Progressive supranuclear ophthalmoplegia [Steele-Richardson-Olszewski]: Secondary | ICD-10-CM | POA: Diagnosis not present

## 2020-10-11 DIAGNOSIS — N319 Neuromuscular dysfunction of bladder, unspecified: Secondary | ICD-10-CM | POA: Diagnosis not present

## 2020-10-11 DIAGNOSIS — S065X0D Traumatic subdural hemorrhage without loss of consciousness, subsequent encounter: Secondary | ICD-10-CM | POA: Diagnosis not present

## 2020-10-11 DIAGNOSIS — I2510 Atherosclerotic heart disease of native coronary artery without angina pectoris: Secondary | ICD-10-CM | POA: Diagnosis not present

## 2020-10-11 DIAGNOSIS — E039 Hypothyroidism, unspecified: Secondary | ICD-10-CM | POA: Diagnosis not present

## 2020-10-11 DIAGNOSIS — R339 Retention of urine, unspecified: Secondary | ICD-10-CM | POA: Diagnosis not present

## 2020-10-11 DIAGNOSIS — E538 Deficiency of other specified B group vitamins: Secondary | ICD-10-CM | POA: Diagnosis not present

## 2020-10-11 DIAGNOSIS — F0150 Vascular dementia without behavioral disturbance: Secondary | ICD-10-CM | POA: Diagnosis not present

## 2020-10-11 DIAGNOSIS — R933 Abnormal findings on diagnostic imaging of other parts of digestive tract: Secondary | ICD-10-CM

## 2020-10-11 MED ORDER — AMLODIPINE BESYLATE 10 MG PO TABS: 10.0000 mg | ORAL_TABLET | Freq: Every day | ORAL | 3 refills | Status: AC

## 2020-10-11 MED ORDER — BISACODYL 10 MG RE SUPP: 10.0000 mg | Freq: Every day | RECTAL | 0 refills | Status: AC | PRN

## 2020-10-11 MED ORDER — BENAZEPRIL HCL 20 MG PO TABS: 20.0000 mg | ORAL_TABLET | Freq: Every morning | ORAL | 3 refills | Status: DC

## 2020-10-11 MED ORDER — SODIUM CHLORIDE 1 G PO TABS: 1.0000 g | ORAL_TABLET | Freq: Two times a day (BID) | ORAL | 2 refills | Status: DC

## 2020-10-11 MED ORDER — FINASTERIDE 5 MG PO TABS: 5.0000 mg | ORAL_TABLET | Freq: Every day | ORAL | 3 refills | Status: AC

## 2020-10-11 MED ORDER — LINACLOTIDE 145 MCG PO CAPS
145.0000 ug | ORAL_CAPSULE | Freq: Every day | ORAL | 3 refills | Status: DC
Start: 2020-10-12 — End: 2020-10-16

## 2020-10-11 NOTE — TOC Transition Note (Signed)
Transition of Care [facility]) - CM/SW Discharge Note   Patient Details  Name: Luis Freeman MRN: [id number] Date of Birth: 1933-07-16  Transition of Care [facility]) CM/SW Contact:  Natasha Bence, LCSW Phone Number: 10/10/2020, 10:50 AM   Clinical Narrative:    CSW notified of patient's readiness for discharge. CSW received bed offer from [facility] and added facility to [place]. [facility] agreeable to take patient on 10/03/2020. TOC signing off.    Final next level of care: Skilled Nursing Facility Barriers to Discharge: Barriers Resolved   Patient Goals and CMS Choice Patient states their goals for this hospitalization and ongoing recovery are:: SNF CMS Medicare.gov Compare Post Acute Care list provided to:: Patient Choice offered to / list presented to : Patient  Discharge Placement                Patient to be transferred to facility by: [facility] Name of family member notified: Loreli Dollar Patient and family notified of of transfer: 10/05/2020  Discharge Plan and Services                DME Arranged: N/A DME Agency: NA       HH Arranged: NA HH Agency: NA        Social Determinants of Health (SDOH) Interventions     Readmission Risk Interventions No flowsheet data found.

## 2020-10-11 NOTE — Care Management Important Message (Signed)
Important Message  Patient Details  Name: Luis Freeman MRN: [id number] Date of Birth: [birth year]   Medicare Important Message Given:  Yes     Tommy Medal 10/07/2020, 12:28 PM

## 2020-10-11 NOTE — Discharge Instructions (Signed)
1) follow-up with neurologist Dr. Louis Meckel in 2 to 3 weeks for medication adjustments and reevaluation of BPH with urinary retention and concerns  2) Foley catheter removed on 10/05/2020 --- please do bladder scans every 6-8 hours  -If there is >> 600 mL of urine in the bladder and patient unable to void please place   Foley catheter in situ--pending evaluation by urologist and voiding trial with urologist  3) please continue to follow-up with Dr. Mickie Bail your movement disorder neurologist at [facility] as previously scheduled  4) okay to consider reducing dose of Linzess if stools become too frequent or loose  5) follow-up with gastroenterologist Dr. Santo Held Rourk within a week to discuss liver biopsy results

## 2020-10-11 NOTE — Progress Notes (Addendum)
Discussed with Dr. Denton Brick. Patient continues to have good bowel movements. No ongoing GI issues. Continue Linzess for regular bowel movements. Can reduce dose to 145 mcg daily or 72 mcg daily depending on ongoing clinical response.  Liver biopsy pathology still pending. Will follow for results and notify patient when they are back. Further recommendations to follow path report.  We will sign off for now, will remove consult order. Please contact us and enter a new consult order if we can be of further assistance.  Thank you for allowing [place] to participate in the care of [place], DNP, AGNP-C Adult & Gerontological Nurse Practitioner [facility]

## 2020-10-11 NOTE — Discharge Summary (Addendum)
Luis Freeman, is a 87 y.o. male  DOB 05/01/1933  MRN [id number].  Admission date:  10/04/2020  Admitting Physician  Albertine Patricia, MD  Discharge Date:  09/28/2020   Primary MD  [name], MD  Recommendations for primary care physician for things to follow:   1) follow-up with neurologist Dr. Louis Meckel in 2 to 3 weeks for medication adjustments and reevaluation of BPH with urinary retention and concerns  2) Foley catheter removed on 09/21/2020 --- please do bladder scans every 6-8 hours  -If there is >> 600 mL of urine in the bladder and patient unable to void please place   Foley catheter in situ--pending evaluation by urologist and voiding trial with urologist  3) please continue to follow-up with Dr. Mickie Bail your movement disorder neurologist at [facility] as previously scheduled  4) okay to consider reducing dose of Linzess if stools become too frequent or loose  5) follow-up with gastroenterologist Dr. Santo Held Rourk within a week to discuss liver biopsy results  Admission Diagnosis  Hyponatremia [E87.1] Constipation, unspecified constipation type [K59.00]   Discharge Diagnosis  Hyponatremia [E87.1] Constipation, unspecified constipation type [K59.00]    Active Problems:   Acute on Chronic HypoNatremia   Gait disorder   Dementia ([place])   Movement disorder   HTN (hypertension)   Constipation   Liver lesion   Bilateral pleural effusion   Status post thoracentesis   Abnormal CT scan, colon      Past Medical History:  Diagnosis Date  . Chronic low back pain   . Dementia ([place])   . Gait abnormality 11/15/2019  . Gait disorder   . Hypertension   . PSP (progressive supranuclear palsy) ([place]) 11/15/2019  . Vitamin B12 deficiency     Past Surgical History:  Procedure Laterality Date  . CATARACT EXTRACTION, BILATERAL    . COLONOSCOPY    . COLONOSCOPY  02/04/2011   Procedure: COLONOSCOPY;   Surgeon: Daneil Dolin, MD;  Location: [facility];  Service: Endoscopy;  Laterality: N/A;     HPI  from the history and physical done on the day of admission:     Luis Freeman  is a 87 y.o. male, with medical history significant fordementia, progressive supranuclear palsy, gait abnormality.  History of fall in the past with subdural hematoma, neurogenic bladder, hypertension, B12 deficiency. -Patient was brought to ED by his daughter, due to concerns of difficulty passing urine, and severe constipation, patient is total care currently, he is having 24-hour with her family or paid caregiver, patient with progressive weakness over last few days, has been less interactive as well, with difficulty passing urine, and no good bowel movement for few days, he saw his urologist yesterday, for dark-colored urine, family were told no UTI, no acute findings, per report patient mental status has decreased, he is not as responsive as usual, patient with dementia, cannot provide any history, it was provided by daughter. - in ED patient had sodium of 121, usually it is in the upper 120s, CT abdomen pelvis  significant for significant stool burden, and abnormal liver findings suspicious for malignancy versus abscess, [facility] hospitalist consulted to admit.     Hospital Course:      Brief Summary:- 87 y.o.malewith medical history significant forprogressive supranuclear palsy with significant gait and speech problems, HTN and history of B12 deficiency, 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, neurogenic bladder, neurogenic bowel with severe constipation and recurrent episodes of hyponatremia admitted on 10/04/20 with acute metabolic encephalopathy, worsening hyponatremia constipation as well as anterior central liver mass and bilateral pleural effusions and abnormal CT abdomen and pelvis findings  A/p 1)Anterior Central Liver Mass and bilateral pleural effusions-- s/p 18 g core bxs x 3 of the anterior  central liver massand post right-sided thoracentesis on 10/08/2020 --Pathology/cytology of liver mass pending -AFP 3.1 -Pleural fluid from thoracentesis with reactive mesothelial cells -Patient will follow up with GI service for results of liver biopsy test post discharge  2)Ambulatory dysfunction/unsteady gait/recurrent falls----PT eval appreciated recommends SNF rehab, --Patient is very unsteady more at risk of falling and requires SNF rehab to return to his pre- admission level of functioning ---Gait concerns persist,  -Patient with poor safety awareness and high risk for falling -Patient and family are agreeable to SNF placement -Transfer to  SNF  rehab  3)Progressive Supranuclear Palsy--- with significant gait, balance and ambulatory concerns as well as speech concerns -Patient also has some cognitive decline -Discussed with  patient's neurologist Dr Regis Bill at [facility] movement disorder/neurology clinic-- at [phone] and  [phone]-- --Continue Sinemet and Namenda -As per [place]--decline in cognitive and executive functioning consistent with dementia has been noted in Mr BRAYLYN KALTER and this is consistent with the progression of progressive supranuclear palsy, memory/amnestic deficits seems to be less pronounced--which again is consistent with PSP related dementia  4)HTN--- c/n amlodipine and benazepril20 mg daily and hydralazine 25 mg bid   5) H/o B12 deficiency----okay to continue monthly B12 shots, repeat serum B12 on 10/06/2019 is 4163  6)HypoNatremia--acute on chronic symptomatic hyponatremia----POA-- -sodium currently in the high 120s -Consider sodium chloride tablets --Patient is to avoid excessive free water -Sodium is up to 128 which is close to patient's usual baseline  7)Acute on chronic chronic urinary retention--- neurogenic bladder in the setting of progressive supranuclear palsy decision was made not to use Flomax or other alpha blockers due to  patient's history of severe orthostatic hypotension/syncope - Foley catheter removed on 09/21/2020 --- please do bladder scans every 6-8 hours  -If there is >> 600 mL of urine in the bladder and patient unable to void please place   Foley catheter in situ--pending evaluation by urologist and voiding trial with urologist ---Foley in situ, follow-up with Dr. Louis Meckel his urologist at [facility] -Started on a trial of Finasteride/Proscar -Myrbetriq has been discontinued -urine culture negative -Outpatient follow-up with urologist Dr. Louis Meckel for voiding trial advised -Okay to keep Foley in situ if urinary difficulties/retention persist  8)Severe Constipation in the setting of neurogenic bowel secondary to progressive supranuclear palsy - Appreciate GI consult and orders for gastrograffin enema on 3/21 - Good results from gastrograffin enema -c/n  Linzess daily, dose has been reduced  9)Social/Ethics--patient is a DNR, previously completed MOST form and HCPOA -Patient gets a lot of support and help from his 4 daughters and his wife, --- they do have caregivers to help patient at home - Disposition---transfer to SNF rehab,   Disposition: The patient is from: Home  Anticipated d/c is to: SNF   Code Status :  -  Code Status: DNR   Family Communication:    (patient is alert, awake and coherent)  Discussed with daughter [name]  Consults  :  Gi/Palliative  Discharge Condition: stable  Follow UP   Contact information for follow-up providers    Rourk, Cristopher Estimable, MD. Schedule an appointment as soon as possible for a visit in 1 week(s).   Specialty: Gastroenterology Why: Liver Mass Contact information: [address] [place] [address] [phone]            Contact information for after-discharge care    [facility] .   Service: Skilled Nursing Contact information: [address] [place] [address] [phone]                  Diet and Activity recommendation:  As advised  Discharge Instructions    Discharge Instructions    Call MD for:  difficulty breathing, headache or visual disturbances   Complete by: As directed    Call MD for:  persistant dizziness or light-headedness   Complete by: As directed    Call MD for:  persistant nausea and vomiting   Complete by: As directed    Call MD for:  temperature >100.4   Complete by: As directed    Diet - low sodium heart healthy   Complete by: As directed    Discharge instructions   Complete by: As directed    1) follow-up with neurologist Dr. Louis Meckel in 2 to 3 weeks for medication adjustments and reevaluation of BPH with urinary retention and concerns  2) Foley catheter removed on 10/05/2020 --- please do bladder scans every 6-8 hours  -If there is >> 600 mL of urine in the bladder and patient unable to void please place   Foley catheter in situ--pending evaluation by urologist and voiding trial with urologist  3) please continue to follow-up with Dr. Mickie Bail your movement disorder neurologist at [facility] as previously scheduled  4) okay to consider reducing dose of Linzess if stools become too frequent or loose  5) follow-up with gastroenterologist Dr. Santo Held Rourk within a week to discuss liver biopsy results   Discharge wound care:   Complete by: As directed    As advised   Increase activity slowly   Complete by: As directed        Discharge Medications     Allergies as of 10/17/2020   No Known Allergies     Medication List    STOP taking these medications   Gemtesa 75 MG Tabs Generic drug: Vibegron   Myrbetriq 50 MG Tb24 tablet Generic drug: mirabegron ER   phenazopyridine 95 MG tablet Commonly known as: PYRIDIUM     TAKE these medications   acetaminophen 325 MG tablet Commonly known as: TYLENOL Take 2 tablets (650 mg total) by mouth every 6 (six) hours as needed for mild  pain (or Fever >/= 101).   amLODipine 10 MG tablet Commonly known as: NORVASC Take 1 tablet (10 mg total) by mouth daily. For BP What changed: Another medication with the same name was removed. Continue taking this medication, and follow the directions you see here.   benazepril 20 MG tablet Commonly known as: LOTENSIN Take 1 tablet (20 mg total) by mouth in the morning. What changed:   medication strength  how much to take   bisacodyl 10 MG suppository Commonly known as: DULCOLAX Place 1 suppository (10 mg total) rectally daily as needed for  mild constipation or moderate constipation.   carbidopa-levodopa 25-100 MG tablet Commonly known as: SINEMET IR Take 1.5 tablets by mouth 3 (three) times daily.   cholecalciferol 1000 units tablet Commonly known as: VITAMIN D Take 1,000 Units by mouth in the morning.   cyanocobalamin 1000 MCG/ML injection Commonly known as: (VITAMIN B-12) Inject 1,000 mcg into the muscle every 30 (thirty) days.   finasteride 5 MG tablet Commonly known as: PROSCAR Take 1 tablet (5 mg total) by mouth daily. Start taking on: October 12, 2020   hydrALAZINE 25 MG tablet Commonly known as: APRESOLINE Take 1.5 tablets (37.5 mg total) by mouth 2 (two) times daily.   linaclotide 145 MCG Caps capsule Commonly known as: LINZESS Take 1 capsule (145 mcg total) by mouth daily before breakfast. Start taking on: October 12, 2020   memantine 10 MG tablet Commonly known as: NAMENDA Take 10 mg by mouth daily with lunch.   Polyvinyl Alcohol-Povidone PF 1.4-0.6 % Soln Apply 1 drop to eye daily as needed (FOR DRY EYE RELIEF).   simethicone 125 MG chewable tablet Commonly known as: MYLICON Chew 782 mg by mouth every 6 (six) hours as needed for flatulence.   sodium chloride 1 g tablet Take 1 tablet (1 g total) by mouth 2 (two) times daily with a meal.            Discharge Care Instructions  (From admission, onward)         Start     Ordered   09/20/2020  0000  Discharge wound care:       Comments: As advised   10/10/2020 1203         Major procedures and Radiology Reports - PLEASE review detailed and final reports for all details, in brief -   DG Chest 1 View  Result Date: 10/07/2020 CLINICAL DATA:  Status post right-sided thoracentesis EXAM: CHEST  1 VIEW COMPARISON:  1 day prior earlier today at 5:02 a.m. FINDINGS: Midline trachea. Mild cardiomegaly. Layering small left pleural effusion is unchanged. Significant improvement to resolution in right-sided pleural fluid. Skin fold over the chest bilaterally, but no pneumothorax. Underlying mild interstitial edema. Left base airspace disease persists. IMPRESSION: Improved resolved right-sided pleural effusion, without pneumothorax. Persistent left pleural effusion, adjacent atelectasis, and interstitial edema. Electronically Signed   By: Abigail Miyamoto M.D.   On: 10/07/2020 10:20   DG Abd 1 View  Result Date: 10/04/2020 CLINICAL DATA:  Lack of bowel movement EXAM: ABDOMEN - 1 VIEW COMPARISON:  02/27/2020 FINDINGS: Prominent stool throughout the colon favors constipation. Thoracic and lumbar spondylosis. Likely chronic suspected lumbar compression fractures at L2, L4, and L5. Poor definition of the left hemidiaphragm, cannot exclude left basilar airspace opacity, consider chest radiography for further workup. Questionable blunting of the right lateral costophrenic angle. IMPRESSION: 1. Prominent stool throughout the colon favors constipation. 2. Poor definition of the left hemidiaphragm, cannot exclude left basilar airspace opacity, consider chest radiography for further workup. 3. Possible small right pleural effusion. 4. Likely chronic lumbar compression fractures at L2, L4, and L5. Electronically Signed   By: Van Clines M.D.   On: 10/04/2020 17:48   CT Head Wo Contrast  Result Date: 10/04/2020 CLINICAL DATA:  Abdominal distension with altered mental status EXAM: CT HEAD WITHOUT CONTRAST  TECHNIQUE: Contiguous axial images were obtained from the base of the skull through the vertex without intravenous contrast. COMPARISON:  CT head 02/27/2020 FINDINGS: Brain: Periventricular white matter and corona radiata hypodensities favor chronic ischemic microvascular white  matter disease. Otherwise, the brainstem, cerebellum, cerebral peduncles, thalamus, basal ganglia, basilar cisterns, and ventricular system appear within normal limits. No intracranial hemorrhage, mass lesion, or acute CVA. Vascular: There is atherosclerotic calcification of the cavernous carotid arteries bilaterally. Skull: Unremarkable Sinuses/Orbits: Mild chronic ethmoid sinusitis. Other: No supplemental non-categorized findings. IMPRESSION: 1. No acute intracranial findings. 2. Periventricular white matter and corona radiata hypodensities favor chronic ischemic microvascular white matter disease. 3. Mild chronic ethmoid sinusitis. Electronically Signed   By: Van Clines M.D.   On: 10/04/2020 22:08   CT ABDOMEN PELVIS W CONTRAST  Result Date: 10/04/2020 CLINICAL DATA:  Abdominal distention and altered mental status, constipation. EXAM: CT ABDOMEN AND PELVIS WITH CONTRAST TECHNIQUE: Multidetector CT imaging of the abdomen and pelvis was performed using the standard protocol following bolus administration of intravenous contrast. CONTRAST:  12mL OMNIPAQUE IOHEXOL 300 MG/ML  SOLN COMPARISON:  Abdomen radiograph 02/27/2020 FINDINGS: Despite efforts by the technologist and patient, motion artifact is present on today's exam and could not be eliminated. This reduces exam sensitivity and specificity. Lower chest: Large bilateral pleural effusions with associated passive atelectasis. These effusions are nonspecific for transudative or exudative etiology. Left anterior descending and right coronary artery atherosclerosis along with descending thoracic aortic atherosclerosis. Small pericardial effusion. Hepatobiliary: Masslike process  with marginal enhancement and central hypoenhancement/central necrosis centered in the left hepatic lobe, measuring 14.2 by 10.3 by 9.9 cm on image 51 series 5. Possibilities include malignancy or abscess. Linear lucency inferiorly in segment 3 of the liver could be from a prominent intrahepatic bile duct or possibly portal vein thrombosis of the associated portal vein tributary. The main portal vein is patent. Gallbladder grossly unremarkable, somewhat blurred by motion artifact. Pancreas: Unremarkable Spleen: Unremarkable. Adrenals/Urinary Tract: Both adrenal glands appear normal. Simple appearing cysts of the right kidney. Urinary bladder unremarkable. Stomach/Bowel: Prominent stool throughout much of the colon, but not in the sigmoid colon or rectum. It is difficult to identify the site of transition due to the motion artifact in today's exam. However, it is probably just distal to the splenic flexure. Borderline rectal wall thickening, without surrounding adenopathy. Vascular/Lymphatic: Aortoiliac atherosclerotic vascular disease. Reproductive: Mild prostatomegaly. Other: Mild diffuse subcutaneous and mesenteric edema. Musculoskeletal: Remote compression fractures at L2, L4, and L5. Lumbar spondylosis and degenerative disc disease causing multilevel impingement. IMPRESSION: 1. Masslike process with marginal enhancement and central necrosis centered in the left hepatic lobe, measuring 14.2 by 10.3 by 9.9 cm (volume = 760 cm^3). Possibilities include malignancy or abscess. Correlate with 2. Linear lucency inferiorly in segment 3 of the liver could be from a dilated intrahepatic bile duct or possibly portal vein thrombosis of the associated portal vein tributary. 3. Large bilateral pleural effusions with associated passive atelectasis. These effusions are nonspecific for transudative or exudative etiology. 4. Prominent stool throughout much of the colon, but not in the sigmoid colon or rectum. Borderline rectal  wall thickening, without surrounding adenopathy. 5. Other imaging findings of potential clinical significance: Coronary atherosclerosis. Small pericardial effusion. Remote compression fractures at L2, L4, and L5. Lumbar spondylosis and degenerative disc disease causing multilevel impingement. Mild prostatomegaly. 6. Despite efforts by the technologist and patient, motion artifact is present on today's exam and could not be eliminated. This reduces exam sensitivity and specificity. 7. Aortic atherosclerosis. Aortic Atherosclerosis (ICD10-I70.0). Electronically Signed   By: Van Clines M.D.   On: 10/04/2020 22:05   US BIOPSY (LIVER)  Result Date: 10/08/2020 INDICATION: Indeterminate anterior large left liver mass EXAM: ULTRASOUND GUIDED CORE BIOPSY  OF ANTERIOR LEFT LIVER MASS MEDICATIONS: 1% LIDOCAINE LOCAL ANESTHESIA/SEDATION: Moderate Sedation Time:  None. The patient was continuously monitored during the procedure by the interventional radiology nurse under my direct supervision. FLUOROSCOPY TIME:  Fluoroscopy Time: None. COMPLICATIONS: None immediate. PROCEDURE: The procedure, risks, benefits, and alternatives were explained to the patient. Questions regarding the procedure were encouraged and answered. The patient understands and consents to the procedure. Previous imaging reviewed. Preliminary ultrasound performed. The large anterior left hepatic mass was localized from a subcostal anterior approach. Under sterile conditions and local anesthesia, a 17 gauge 6.8 cm access was advanced into the lesion. Needle position confirmed with ultrasound. 18 gauge core biopsies obtained. These were intact and non fragmented. Samples placed in formalin. Needle tract occluded with Gel-Foam. Postprocedure imaging demonstrates no hemorrhage or hematoma. Patient tolerated biopsy well. FINDINGS: Imaging confirms needle placement in the anterior large left liver lesion for core biopsy IMPRESSION: Successful ultrasound  anterior left hepatic mass 18 gauge core biopsy Electronically Signed   By: Jerilynn Mages.  Shick M.D.   On: 10/08/2020 11:44   DG CHEST PORT 1 VIEW  Result Date: 10/07/2020 CLINICAL DATA:  Pleural effusion EXAM: PORTABLE CHEST 1 VIEW COMPARISON:  October 04, 2020 FINDINGS: Pleural effusions again noted bilaterally, layering. There is persistent airspace opacity in the medial left base. Bilateral apical pleural thickening is stable. No new opacity. Heart is upper normal in size with pulmonary vascularity normal. No adenopathy. There is aortic atherosclerosis. Bones are osteoporotic. IMPRESSION: Layering pleural effusions bilaterally. Airspace opacity left lower lobe which may represent atelectasis with potential underlying pneumonia. No new opacity. Stable cardiac silhouette. Aortic Atherosclerosis (ICD10-I70.0). Electronically Signed   By: Lowella Grip III M.D.   On: 10/07/2020 07:59   DG Chest Port 1 View  Result Date: 10/04/2020 CLINICAL DATA:  Weakness EXAM: PORTABLE CHEST 1 VIEW COMPARISON:  10/20/2015 FINDINGS: Bilateral layering pleural effusions. Bilateral lower lobe airspace opacities. Heart is normal size. Aortic atherosclerosis. No acute bony abnormality. IMPRESSION: Bilateral layering effusions with bibasilar atelectasis or infiltrates. Electronically Signed   By: Rolm Baptise M.D.   On: 10/04/2020 19:45   DG BE (COLON)W SINGLE CM (SOL OR THIN BA)  Result Date: 10/07/2020 CLINICAL DATA:  Constipation with lack of bowel movements for 8 days. Hepatic mass on CT. EXAM: WATER SOLUBLE CONTRAST ENEMA TECHNIQUE: Single-contrast non prepped barium enema was performed for both therapeutic and attempted diagnostic purposes. FLUOROSCOPY TIME:  Fluoroscopy Time:  3 minutes and 30 seconds Radiation Exposure Index (if provided by the fluoroscopic device): 4 Number of Acquired Spot Images: Not applicable. COMPARISON:  Abdominopelvic CT of 10/04/2020. FINDINGS: Preprocedure scout film demonstrates a large amount of  colonic stool. No bowel obstruction. Focused, limited single-contrast exam performed with the patient in a left lateral and supine position. Limitations secondary to patient's age and clinical status. The rectum and sigmoid are grossly normal, without circumferential narrowing. From the sigmoid proximally, the colon is stool filled. Contrast filled to approximately the level of the mid transverse colon. IMPRESSION: 1. Focused therapeutic and less so diagnostic non prepped barium enema performed. 2. No evidence of circumferential or obstructive mass, especially within the rectum or sigmoid. Electronically Signed   By: Abigail Miyamoto M.D.   On: 10/07/2020 11:46   US THORACENTESIS ASP PLEURAL SPACE W/IMG GUIDE  Result Date: 10/07/2020 INDICATION: Bilateral pleural fluid. EXAM: ULTRASOUND GUIDED RIGHT filled THORACENTESIS MEDICATIONS: None. COMPLICATIONS: None immediate. PROCEDURE: An ultrasound guided thoracentesis was thoroughly discussed with the patient and the patient's daughter and questions  answered. The benefits, risks, alternatives and complications were also discussed. The patient understands and wishes to proceed with the procedure. Written consent was obtained. Ultrasound was performed to localize and mark an adequate pocket of fluid in the right chest. The area was then prepped and draped in the normal sterile fashion. 1% Lidocaine was used for local anesthesia. Under ultrasound guidance a 19 gauge, 7-cm, Yueh catheter was introduced. Thoracentesis was performed. The catheter was removed and a dressing applied. FINDINGS: A total of approximately 1.2 of yellow fluid was removed. Samples were sent to the laboratory as requested by the clinical team. IMPRESSION: Successful ultrasound guided right thoracentesis yielding 1.2 L of pleural fluid. Electronically Signed   By: Abigail Miyamoto M.D.   On: 10/07/2020 10:22   Micro Results   Recent Results (from the past 240 hour(s))  SARS CORONAVIRUS 2 (TAT 6-24  HRS) Nasopharyngeal Nasopharyngeal Swab     Status: None   Collection Time: 10/04/20 10:00 PM   Specimen: Nasopharyngeal Swab  Result Value Ref Range Status   SARS Coronavirus 2 NEGATIVE NEGATIVE Final    Comment: (NOTE) SARS-CoV-2 target nucleic acids are NOT DETECTED.  The SARS-CoV-2 RNA is generally detectable in upper and lower respiratory specimens during the acute phase of infection. Negative results do not preclude SARS-CoV-2 infection, do not rule out co-infections with other pathogens, and should not be used as the sole basis for treatment or other patient management decisions. Negative results must be combined with clinical observations, patient history, and epidemiological information. The expected result is Negative.  Fact Sheet for Patients: SugarRoll.be  Fact Sheet for Healthcare Providers: https://www.woods-mathews.com/  This test is not yet approved or cleared by the [place] FDA and  has been authorized for detection and/or diagnosis of SARS-CoV-2 by FDA under an Emergency Use Authorization (EUA). This EUA will remain  in effect (meaning this test can be used) for the duration of the COVID-19 declaration under Se ction 564(b)(1) of the Act, 21 U.S.C. section 360bbb-3(b)(1), unless the authorization is terminated or revoked sooner.  Performed at [facility], [place] [address], [place], [place] [address]   Urine Culture     Status: None   Collection Time: 10/04/20 10:41 PM   Specimen: Urine, Clean Catch  Result Value Ref Range Status   Specimen Description   Final    URINE, CLEAN CATCH Performed at [facility], [address], [place], [place] [address]    Special Requests   Final    NONE Performed at [facility], [address] [place], [place] [address]    Culture   Final    NO GROWTH Performed at [facility], [place] [address], [place], [place] [address]    Report Status 10/06/2020 FINAL  Final   Culture, body fluid w Gram Stain-bottle     Status: None (Preliminary result)   Collection Time: 10/07/20  9:45 AM   Specimen: Pleura  Result Value Ref Range Status   Specimen Description PLEURAL  Final   Special Requests 10CC  Final   Culture   Final    NO GROWTH 4 DAYS Performed at [facility], [address] [place], [place] [address]    Report Status PENDING  Incomplete  Gram stain     Status: None   Collection Time: 10/07/20  9:45 AM   Specimen: Pleura  Result Value Ref Range Status   Specimen Description PLEURAL  Final   Special Requests NONE  Final   Gram Stain  Final    NO ORGANISMS SEEN WBC PRESENT,BOTH PMN AND MONONUCLEAR Performed at [facility], [address] [place], [place] [address]    Report Status 10/07/2020 FINAL  Final  Fungus Culture With Stain     Status: None (Preliminary result)   Collection Time: 10/07/20  9:47 AM  Result Value Ref Range Status   Fungus Stain Final report  Final    Comment: (NOTE) Performed At: [facility] [place], [place] [id number] Rush Farmer MD [id number]    Fungus (Mycology) Culture PENDING  Incomplete   Fungal Source PLEURAL  Final    Comment: Performed at [facility], [address] [place], [place] [address]  Fungus Culture Result     Status: None   Collection Time: 10/07/20  9:47 AM  Result Value Ref Range Status   Result 1 Comment  Final    Comment: (NOTE) KOH/Calcofluor preparation:  no fungus observed. Performed At: [facility] [place], [place] [id number] Rush Farmer MD [id number]    Today   Subjective    Betzalel Umbarger today has no new concerns  [name] and wife at bedside -No fevers, no emesis, patient had BM          Patient has been seen and examined prior to discharge   Objective   Blood pressure (!) 126/53, pulse 76, temperature 98.6 F (37 C), resp. rate 16, height 5' 11.5" (1.816 m), weight 68.5 kg, SpO2 94  %.   Intake/Output Summary (Last 24 hours) at 09/25/2020 1619 Last data filed at 10/10/2020 0553 Gross per 24 hour  Intake 240 ml  Output 1300 ml  Net -1060 ml    Exam Gen:- Awake Alert,  In no apparent distress  HEENT:- [place].AT, No sclera icterus Neck-Supple Neck,No JVD,.  Lungs-  CTAB , fair symmetrical air movement CV- S1, S2 normal, regular  Abd-  +ve B.Sounds, Abd Soft, No tenderness,    Extremity/Skin:- No  edema, pedal pulses present  Psych-baseline cognitive and memory deficits , not very verbal neuro-very unsteady gait, stiffness, no additional new focal deficits, no tremors GU--Foley catheter removed, may have to be reinserted if fails voiding trial   Data Review   CBC w Diff:  Lab Results  Component Value Date   WBC 10.4 10/10/2020   HGB 11.3 (L) 10/10/2020   HCT 35.0 (L) 10/10/2020   PLT 359 10/10/2020   LYMPHOPCT 5 10/10/2020   MONOPCT 8 10/10/2020   EOSPCT 1 10/10/2020   BASOPCT 1 10/10/2020    CMP:  Lab Results  Component Value Date   NA 128 (L) 10/10/2020   NA 135 04/02/2020   K 3.8 10/10/2020   CL 96 (L) 10/10/2020   CO2 21 (L) 10/10/2020   BUN 15 10/10/2020   BUN 16 04/02/2020   CREATININE 0.72 10/10/2020   PROT 6.0 (L) 10/05/2020   PROT 6.8 04/02/2020   ALBUMIN 2.9 (L) 10/05/2020   ALBUMIN 4.5 04/02/2020   BILITOT 0.6 10/05/2020   BILITOT 0.3 04/02/2020   ALKPHOS 113 10/05/2020   AST 21 10/05/2020   ALT 7 10/05/2020  .   Total Discharge time is about 33 minutes  Roxan Hockey M.D on 10/01/2020 at 4:19 PM  Go to [email or web address] -  for contact info  [facility] - Office  [phone]

## 2020-10-12 LAB — CULTURE, BODY FLUID W GRAM STAIN -BOTTLE: Culture: NO GROWTH

## 2020-10-14 ENCOUNTER — Encounter: Payer: Self-pay | Admitting: Adult Health

## 2020-10-14 ENCOUNTER — Non-Acute Institutional Stay (SKILLED_NURSING_FACILITY): Payer: Medicare PPO | Admitting: Adult Health

## 2020-10-14 DIAGNOSIS — J90 Pleural effusion, not elsewhere classified: Secondary | ICD-10-CM

## 2020-10-14 DIAGNOSIS — E871 Hypo-osmolality and hyponatremia: Secondary | ICD-10-CM

## 2020-10-14 DIAGNOSIS — F0150 Vascular dementia without behavioral disturbance: Secondary | ICD-10-CM

## 2020-10-14 DIAGNOSIS — Z9889 Other specified postprocedural states: Secondary | ICD-10-CM

## 2020-10-14 DIAGNOSIS — R269 Unspecified abnormalities of gait and mobility: Secondary | ICD-10-CM

## 2020-10-14 DIAGNOSIS — E538 Deficiency of other specified B group vitamins: Secondary | ICD-10-CM

## 2020-10-14 DIAGNOSIS — I10 Essential (primary) hypertension: Secondary | ICD-10-CM

## 2020-10-14 DIAGNOSIS — G231 Progressive supranuclear ophthalmoplegia [Steele-Richardson-Olszewski]: Secondary | ICD-10-CM | POA: Diagnosis not present

## 2020-10-14 DIAGNOSIS — K769 Liver disease, unspecified: Secondary | ICD-10-CM | POA: Diagnosis not present

## 2020-10-14 DIAGNOSIS — R339 Retention of urine, unspecified: Secondary | ICD-10-CM | POA: Insufficient documentation

## 2020-10-14 DIAGNOSIS — K5904 Chronic idiopathic constipation: Secondary | ICD-10-CM

## 2020-10-14 DIAGNOSIS — I700 Atherosclerosis of aorta: Secondary | ICD-10-CM

## 2020-10-14 LAB — SURGICAL PATHOLOGY

## 2020-10-14 NOTE — Progress Notes (Signed)
Location:  [place] Room Number: 545 Place of Service:  SNF (31)   CODE STATUS: dnr   No Known Allergies  Chief Complaint  Patient presents with  . Hospitalization Follow-up    HPI:  He is a 87 year old man who has been hospitalized from 10-04-20 through 09/28/2020. His medical history includes: hypertension; dementia; fall with subdural hematoma (July 2021) ; neurogenic bladder; supranuclear palsy. He was taken to the ED with concerns of increased difficulty with urination; significant constipation and progressive weakness. In the ED he had a sodium of 121 and abnormal liver findings. He was found to have bilateral pleural effusions significant constipation and liver mass. He has had a liver biopsy performed and will need to follow up with GI. He had a right thoracentesis on 10-07-20 with 1.5 liters of fluid return. He has a very unsteady gait with recurrent falls related to his progressive supranuclear palsy. He will need to keep his follow up appointment with Dr. Regis Bill at [facility]. His decline in cognitive and executive function memory which are related PSP and dementia. He does have a history of neurogenic bladder; is undergoing bladder scans on a routine basis.  At this time his goal is to return back home with family. He does have care givers with him as well. I am not certain if this goal is obtainable at this time. There are no reports of constipation; no cough no shortness of breath. He will continue to be followed for his chronic illnesses including: Essential hypertension: Bilateral pleural effusion b. PSP (progressive supranuclear palsy)    Past Medical History:  Diagnosis Date  . Chronic low back pain   . Dementia ( [place])   . Gait abnormality 11/15/2019  . Gait disorder   . Hypertension   . PSP (progressive supranuclear palsy) ([place]) 11/15/2019  . Vitamin B12 deficiency     Past Surgical History:  Procedure Laterality Date  . CATARACT EXTRACTION,  BILATERAL    . COLONOSCOPY    . COLONOSCOPY  02/04/2011   Procedure: COLONOSCOPY;  Surgeon: Daneil Dolin, MD;  Location[facility];  Service: Endoscopy;  Laterality: N/A;    Social History   Socioeconomic History  . Marital status: Married    Spouse name: Not on file  . Number of children: Not on file  . Years of education: Not on file  . Highest education level: Not on file  Occupational History  . Not on file  Tobacco Use  . Smoking status: Never Smoker  . Smokeless tobacco: Never Used  Vaping Use  . Vaping Use: Never used  Substance and Sexual Activity  . Alcohol use: Not Currently    Alcohol/week: 0.0 standard drinks  . Drug use: No  . Sexual activity: Not on file  Other Topics Concern  . Not on file  Social History Narrative   Lives with wife and caregivers 24/7   Right handed   Drinks 1-2 cups caffeine daily   Social Determinants of Health   Financial Resource Strain: Not on file  Food Insecurity: Not on file  Transportation Needs: Not on file  Physical Activity: Not on file  Stress: Not on file  Social Connections: Not on file  Intimate Partner Violence: Not on file   Family History  Problem Relation Age of Onset  . Diabetes Mother   . Cancer Father   . Cancer - Lung Brother       VITAL SIGNS BP (!) 146/77  Pulse 88   Temp 98 F (36.7 C)   Resp 20   Ht 5' 11.5" (1.816 m)   Wt 151 lb (68.5 kg)   SpO2 100%   BMI 20.77 kg/m   Outpatient Encounter Medications as of 10/14/2020  Medication Sig  . acetaminophen (TYLENOL) 325 MG tablet Take 2 tablets (650 mg total) by mouth every 6 (six) hours as needed for mild pain (or Fever >/= 101).  Marland Kitchen amLODipine (NORVASC) 10 MG tablet Take 1 tablet (10 mg total) by mouth daily. For BP  . benazepril (LOTENSIN) 20 MG tablet Take 1 tablet (20 mg total) by mouth in the morning.  . bisacodyl (DULCOLAX) 10 MG suppository Place 1 suppository (10 mg total) rectally daily as needed for mild constipation or moderate  constipation.  . carbidopa-levodopa (SINEMET IR) 25-100 MG tablet Take 1.5 tablets by mouth 3 (three) times daily.  . cholecalciferol (VITAMIN D) 1000 units tablet Take 1,000 Units by mouth in the morning.   . cyanocobalamin (,VITAMIN B-12,) 1000 MCG/ML injection Inject 1,000 mcg into the muscle every 30 (thirty) days.   . finasteride (PROSCAR) 5 MG tablet Take 1 tablet (5 mg total) by mouth daily.  . hydrALAZINE (APRESOLINE) 25 MG tablet Take 1.5 tablets (37.5 mg total) by mouth 2 (two) times daily.  Marland Kitchen linaclotide (LINZESS) 145 MCG CAPS capsule Take 1 capsule (145 mcg total) by mouth daily before breakfast.  . memantine (NAMENDA) 10 MG tablet Take 10 mg by mouth daily with lunch.  . Polyvinyl Alcohol-Povidone PF 1.4-0.6 % SOLN Apply 1 drop to eye daily as needed (FOR DRY EYE RELIEF).   Marland Kitchen simethicone (MYLICON) 735 MG chewable tablet Chew 125 mg by mouth every 6 (six) hours as needed for flatulence.  . sodium chloride 1 g tablet Take 1 tablet (1 g total) by mouth 2 (two) times daily with a meal.   No facility-administered encounter medications on file as of 10/14/2020.     SIGNIFICANT DIAGNOSTIC EXAMS  TODAY  10-04-20: chest x-ray:  Bilateral layering pleural effusions. Bilateral lower lobe airspace opacities. Heart is normal size. Aortic atherosclerosis. No acute bony abnormality.  10-04-20: ct of abdomen and pelvis 1. Masslike process with marginal enhancement and central necrosis centered in the left hepatic lobe, measuring 14.2 by 10.3 by 9.9 cm (volume = 760 cm^3). Possibilities include malignancy or abscess. Correlate with 2. Linear lucency inferiorly in segment 3 of the liver could be from a dilated intrahepatic bile duct or possibly portal vein thrombosis of the associated portal vein tributary. 3. Large bilateral pleural effusions with associated passive atelectasis. These effusions are nonspecific for transudative or exudative etiology. 4. Prominent stool throughout much of the  colon, but not in the sigmoid colon or rectum. Borderline rectal wall thickening, without surrounding adenopathy. 5. Other imaging findings of potential clinical significance: Coronary atherosclerosis. Small pericardial effusion. Remote compression fractures at L2, L4, and L5. Lumbar spondylosis and degenerative disc disease causing multilevel impingement. Mild prostatomegaly. 6. Despite efforts by the technologist and patient, motion artifact is present on today's exam and could not be eliminated. This reduces exam sensitivity and specificity. 7. Aortic atherosclerosis.  10-04-20: ct of head:  1. No acute intracranial findings. 2. Periventricular white matter and corona radiata hypodensities favor chronic ischemic microvascular white matter disease. 3. Mild chronic ethmoid sinusitis.  10-07-20: chest x-ray:  Improved resolved right-sided pleural effusion, without pneumothorax. Persistent left pleural effusion, adjacent atelectasis, and interstitial edema.  LABS REVIEWED TODAY;   10-04-20: wbc 12.0; hgb 12.9;  hct 38.8; mcv 96.0 plt 324; glucose 104; bun 18; creat 0.77; k+ 4.2; na++ 121 ca 8.5 GFR >60 alk phos 134; albumin 3.5  Urine culture no growth 10-05-20: vit B 12: 4163 10-06-20: wbc 13.9; hgb 12.3; hct 37.6; mcv 96.2 plt 356; glucose 89; bun 19; creat 0.74; k+ 4.3; na++ 127; ca 8.5 GFR>60 mag 2.0 10-09-20: wbc 11.0; hgb 11.7; hct 36.0; mcv 97.0 plt 342; glucose 88; bun 15; creat 0.72; k+ 3.8; na++ 128; ca 8.3 GFR>60 10-10-20: wbc 10.4; hgb 11.3; hct 35.0; mcv 96.7 plt 359; glucose 88; bun 15; creat 0.72 k+ 3.8; na++ 128; ca 8.3 GFR>60  Review of Systems  Unable to perform ROS: Dementia (unable to participate )    Physical Exam Constitutional:      General: He is not in acute distress.    Appearance: Normal appearance. He is well-developed. He is not diaphoretic.  Neck:     Thyroid: No thyromegaly.  Cardiovascular:     Rate and Rhythm: Normal rate and regular rhythm.     Pulses:  Normal pulses.     Heart sounds: Normal heart sounds.  Pulmonary:     Effort: Pulmonary effort is normal. No respiratory distress.     Breath sounds: Normal breath sounds.  Abdominal:     General: Bowel sounds are normal. There is distension.     Palpations: Abdomen is soft.     Tenderness: There is no abdominal tenderness.  Musculoskeletal:     Cervical back: Neck supple.     Right lower leg: No edema.     Left lower leg: No edema.     Comments: Is able to move all extremities Has abnormal movements present.   Lymphadenopathy:     Cervical: No cervical adenopathy.  Skin:    General: Skin is warm and dry.  Neurological:     Mental Status: He is alert. Mental status is at baseline.  Psychiatric:        Mood and Affect: Mood normal.       ASSESSMENT/ PLAN:  TODAY  1. Constipation: is at this time stable abdomen is slightly distended will continue linzess 145 mcg daily and has prn dulcolax suppository  2. Essential hypertension: is stable b/p 146/77: will continue norvasc 10 mg daily lotensin 20 mg daily hydralazine 37.5 mg twice daily   3. Bilateral pleural effusion status post right thoracentesis 1.2 liters will monitor  4. PSP (progressive supranuclear palsy) is without change in status will continue sinemet 25/100 mg 1.5 tabs three times daily   5. Acute on chronic hyponatremia: is stable na++ 128 will continue nacl 1 gm twice daily  6. Vascular dementia without behavioral disturbance: is without change weight is 151 pounds; will continue namenda 10 mg twice daily   7. Aortic atherosclerosis: (cxr 10-04-20) will monitor   8. Liver lesion: 14.2 x 10.3 x 9.9cm per scan is status post biopsy; awaiting GI consult for further follow up   9. Vitamin B 12 deficiency: level 4163 will continue monthly injections at this time  10. Gait disturbance: will continue therapy as directed to improve upon his level of independence with his adls.   11. Acute on chronic urine  retention: at this time foley is out; will continue bladder scans; and will follow up with urology.    Will check bmp  Time spent with patient: 45 minutes: on medications; coordination of care to include follow up consults and therapy goals.      Ok Edwards NP  [place] [phone] Monday through Friday 8am- 5pm  After hours call [phone]

## 2020-10-15 ENCOUNTER — Non-Acute Institutional Stay (SKILLED_NURSING_FACILITY): Payer: Medicare PPO | Admitting: Adult Health

## 2020-10-15 DIAGNOSIS — K5904 Chronic idiopathic constipation: Secondary | ICD-10-CM

## 2020-10-15 DIAGNOSIS — C787 Secondary malignant neoplasm of liver and intrahepatic bile duct: Secondary | ICD-10-CM

## 2020-10-16 ENCOUNTER — Other Ambulatory Visit (HOSPITAL_COMMUNITY)
Admission: RE | Admit: 2020-10-16 | Discharge: 2020-10-16 | Disposition: A | Discharge status: Home / Self Care | Payer: Medicare PPO | Source: Skilled Nursing Facility | Attending: Adult Health | Admitting: Adult Health

## 2020-10-16 ENCOUNTER — Non-Acute Institutional Stay (SKILLED_NURSING_FACILITY): Payer: Medicare PPO | Admitting: Adult Health

## 2020-10-16 ENCOUNTER — Encounter: Payer: Self-pay | Admitting: Adult Health

## 2020-10-16 DIAGNOSIS — E871 Hypo-osmolality and hyponatremia: Secondary | ICD-10-CM

## 2020-10-16 LAB — FUNGUS CULTURE RESULT

## 2020-10-16 LAB — MAGNESIUM: Magnesium: 2 mg/dL (ref 1.7–2.4)

## 2020-10-16 LAB — BASIC METABOLIC PANEL
Anion gap: 9 (ref 5–15)
BUN: 14 mg/dL (ref 8–23)
CO2: 22 mmol/L (ref 22–32)
Calcium: 8.8 mg/dL — ABNORMAL LOW (ref 8.9–10.3)
Chloride: 96 mmol/L — ABNORMAL LOW (ref 98–111)
Creatinine, Ser: 0.51 mg/dL — ABNORMAL LOW (ref 0.61–1.24)
GFR, Estimated: >60 mL/min (ref >60)
Glucose, Bld: 92 mg/dL (ref 70–99)
Potassium: 4.3 mmol/L (ref 3.5–5.1)
Sodium: 127 mmol/L — ABNORMAL LOW (ref 135–145)

## 2020-10-16 LAB — FUNGUS CULTURE WITH STAIN

## 2020-10-16 LAB — FUNGAL ORGANISM REFLEX

## 2020-10-16 NOTE — Progress Notes (Signed)
Location:  [place] Room Number: 133/P Place of Service:  SNF (31)   CODE STATUS: DNR  No Known Allergies  Chief Complaint  Patient presents with  . Follow-up    Lab Follow Up     HPI:    Past Medical History:  Diagnosis Date  . Chronic low back pain   . Dementia ([place])   . Gait abnormality 11/15/2019  . Gait disorder   . Hypertension   . PSP (progressive supranuclear palsy) ([place]) 11/15/2019  . Vitamin B12 deficiency     Past Surgical History:  Procedure Laterality Date  . CATARACT EXTRACTION, BILATERAL    . COLONOSCOPY    . COLONOSCOPY  02/04/2011   Procedure: COLONOSCOPY;  Surgeon: Daneil Dolin, MD;  Location: [facility];  Service: Endoscopy;  Laterality: N/A;    Social History   Socioeconomic History  . Marital status: Married    Spouse name: Not on file  . Number of children: Not on file  . Years of education: Not on file  . Highest education level: Not on file  Occupational History  . Not on file  Tobacco Use  . Smoking status: Never Smoker  . Smokeless tobacco: Never Used  Vaping Use  . Vaping Use: Never used  Substance and Sexual Activity  . Alcohol use: Not Currently    Alcohol/week: 0.0 standard drinks  . Drug use: No  . Sexual activity: Not on file  Other Topics Concern  . Not on file  Social History Narrative   Lives with wife and caregivers 24/7   Right handed   Drinks 1-2 cups caffeine daily   Social Determinants of Health   Financial Resource Strain: Not on file  Food Insecurity: Not on file  Transportation Needs: Not on file  Physical Activity: Not on file  Stress: Not on file  Social Connections: Not on file  Intimate Partner Violence: Not on file   Family History  Problem Relation Age of Onset  . Diabetes Mother   . Cancer Father   . Cancer - Lung Brother       VITAL SIGNS BP (!) 154/82   Pulse 83   Temp 98.3 F (36.8 C)   Resp 20   Ht 5' 11.5" (1.816 m)   Wt 151 lb (68.5 kg)   SpO2  94%   BMI 20.77 kg/m   Outpatient Encounter Medications as of 10/16/2020  Medication Sig  . acetaminophen (TYLENOL) 325 MG tablet Take 2 tablets (650 mg total) by mouth every 6 (six) hours as needed for mild pain (or Fever >/= 101).  Marland Kitchen amLODipine (NORVASC) 10 MG tablet Take 1 tablet (10 mg total) by mouth daily. For BP  . Balsam [place]-Castor Oil (VENELEX) OINT Apply topically. Special Instructions: Apply Venelex to sacrum and buttocks Q shift and after each incontinence episode.  . bisacodyl (DULCOLAX) 10 MG suppository Place 1 suppository (10 mg total) rectally daily as needed for mild constipation or moderate constipation.  . carbidopa-levodopa (SINEMET IR) 25-100 MG tablet Take 1.5 tablets by mouth 3 (three) times daily.  . cholecalciferol (VITAMIN D) 1000 units tablet Take 1,000 Units by mouth in the morning.   . cyanocobalamin (,VITAMIN B-12,) 1000 MCG/ML injection Inject 1,000 mcg into the muscle every 30 (thirty) days.   . finasteride (PROSCAR) 5 MG tablet Take 1 tablet (5 mg total) by mouth daily.  . hydrALAZINE (APRESOLINE) 25 MG tablet Take 1.5 tablets (37.5 mg total) by mouth 2 (two) times  daily.  . linaclotide (LINZESS) 290 MCG CAPS capsule Take 290 mcg by mouth daily before breakfast.  . lisinopril (ZESTRIL) 20 MG tablet Take 20 mg by mouth daily.  . memantine (NAMENDA) 10 MG tablet Take 10 mg by mouth daily with lunch.  . NON FORMULARY Diet Regular  . NON FORMULARY Fluid restrictions 1500CC in 24 hours. Dietary 500cc Nursing provider 7a-3p 420cc and 3p-11p 420cc and 11p-7a 160cc . Every Shift Day, Evening, Night  . polyethylene glycol (MIRALAX / GLYCOLAX) 17 g packet Take 17 g by mouth daily.  . Polyvinyl Alcohol-Povidone PF 1.4-0.6 % SOLN Apply 1 drop to eye daily as needed (FOR DRY EYE RELIEF).   Marland Kitchen simethicone (MYLICON) 081 MG chewable tablet Chew 125 mg by mouth every 6 (six) hours as needed for flatulence.  . sodium chloride 1 g tablet Take 1 g by mouth 3 (three) times daily.   . traMADol (ULTRAM) 50 MG tablet Take 50 mg by mouth every 6 (six) hours as needed.   No facility-administered encounter medications on file as of 10/16/2020.     SIGNIFICANT DIAGNOSTIC EXAMS   PREVIOUS   10-04-20: chest x-ray:  Bilateral layering pleural effusions. Bilateral lower lobe airspace opacities. Heart is normal size. Aortic atherosclerosis. No acute bony abnormality.  10-04-20: ct of abdomen and pelvis 1. Masslike process with marginal enhancement and central necrosis centered in the left hepatic lobe, measuring 14.2 by 10.3 by 9.9 cm (volume = 760 cm^3). Possibilities include malignancy or abscess. Correlate with 2. Linear lucency inferiorly in segment 3 of the liver could be from a dilated intrahepatic bile duct or possibly portal vein thrombosis of the associated portal vein tributary. 3. Large bilateral pleural effusions with associated passive atelectasis. These effusions are nonspecific for transudative or exudative etiology. 4. Prominent stool throughout much of the colon, but not in the sigmoid colon or rectum. Borderline rectal wall thickening, without surrounding adenopathy. 5. Other imaging findings of potential clinical significance: Coronary atherosclerosis. Small pericardial effusion. Remote compression fractures at L2, L4, and L5. Lumbar spondylosis and degenerative disc disease causing multilevel impingement. Mild prostatomegaly. 6. Despite efforts by the technologist and patient, motion artifact is present on today's exam and could not be eliminated. This reduces exam sensitivity and specificity. 7. Aortic atherosclerosis.  10-04-20: ct of head:  1. No acute intracranial findings. 2. Periventricular white matter and corona radiata hypodensities favor chronic ischemic microvascular white matter disease. 3. Mild chronic ethmoid sinusitis.  10-07-20: chest x-ray:  Improved resolved right-sided pleural effusion, without pneumothorax. Persistent left pleural  effusion, adjacent atelectasis, and interstitial edema.  NO NEW EXAMS   LABS REVIEWED PREVIOUS    10-04-20: wbc 12.0; hgb 12.9; hct 38.8; mcv 96.0 plt 324; glucose 104; bun 18; creat 0.77; k+ 4.2; na++ 121 ca 8.5 GFR >60 alk phos 134; albumin 3.5  Urine culture no growth 10-05-20: vit B 12: 4163 10-06-20: wbc 13.9; hgb 12.3; hct 37.6; mcv 96.2 plt 356; glucose 89; bun 19; creat 0.74; k+ 4.3; na++ 127; ca 8.5 GFR>60 mag 2.0 10-09-20: wbc 11.0; hgb 11.7; hct 36.0; mcv 97.0 plt 342; glucose 88; bun 15; creat 0.72; k+ 3.8; na++ 128; ca 8.3 GFR>60 10-10-20: wbc 10.4; hgb 11.3; hct 35.0; mcv 96.7 plt 359; glucose 88; bun 15; creat 0.72 k+ 3.8; na++ 128; ca 8.3 GFR>60  TODAY  10-16-20: glucose 92; bun 14; creat 0.51; k+ 4.3; na++ 127; ca 8.8 GFR>60; mag 2.0    Review of Systems  Unable to perform ROS: Dementia (unable  to participate )    Physical Exam Constitutional:      General: He is not in acute distress.    Appearance: He is well-developed. He is not diaphoretic.  Neck:     Thyroid: No thyromegaly.  Cardiovascular:     Rate and Rhythm: Normal rate and regular rhythm.     Pulses: Normal pulses.     Heart sounds: Normal heart sounds.  Pulmonary:     Effort: Pulmonary effort is normal. No respiratory distress.     Breath sounds: Normal breath sounds.  Abdominal:     General: Bowel sounds are normal. There is no distension.     Palpations: Abdomen is soft.     Tenderness: There is no abdominal tenderness.  Musculoskeletal:     Cervical back: Neck supple.     Right lower leg: No edema.     Left lower leg: No edema.     Comments: Is able to move all extremities Has abnormal movements present.     Lymphadenopathy:     Cervical: No cervical adenopathy.  Skin:    General: Skin is warm and dry.  Neurological:     Mental Status: He is alert. Mental status is at baseline.  Psychiatric:        Mood and Affect: Mood normal.       ASSESSMENT/ PLAN:  TODAY  1. Acute on chronic  hyponatremia: his current level is 127 which is without significant change. Will increased NACL to 1 mg three times daily; follow up with BMP  and will monitor her status     Ok Edwards NP [facility]  Contact [phone] Monday through Friday 8am- 5pm  After hours call [phone]

## 2020-10-16 NOTE — Progress Notes (Signed)
Location:  [facility] Room Number: 240 Place of Service:  SNF (31)   CODE STATUS: dnr   No Known Allergies  Chief Complaint  Patient presents with  . Acute Visit    Constipation     HPI:  His  Bowels have not moved since Saturday his abdomen is distended and his bowel sounds are hypoactive. There are no indications of pain present.   Past Medical History:  Diagnosis Date  . Chronic low back pain   . Dementia ([place])   . Gait abnormality 11/15/2019  . Gait disorder   . Hypertension   . 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 Vitamin B12 deficiency     Past Surgical History:  Procedure Laterality Date  . CATARACT EXTRACTION, BILATERAL    . COLONOSCOPY    . COLONOSCOPY  02/04/2011   Procedure: COLONOSCOPY;  Surgeon: Daneil Dolin, MD;  Location: [facility];  Service: Endoscopy;  Laterality: N/A;    Social History   Socioeconomic History  . Marital status: Married    Spouse name: Not on file  . Number of children: Not on file  . Years of education: Not on file  . Highest education level: Not on file  Occupational History  . Not on file  Tobacco Use  . Smoking status: Never Smoker  . Smokeless tobacco: Never Used  Vaping Use  . Vaping Use: Never used  Substance and Sexual Activity  . Alcohol use: Not Currently    Alcohol/week: 0.0 standard drinks  . Drug use: No  . Sexual activity: Not on file  Other Topics Concern  . Not on file  Social History Narrative   Lives with wife and caregivers 24/7   Right handed   Drinks 1-2 cups caffeine daily   Social Determinants of Health   Financial Resource Strain: Not on file  Food Insecurity: Not on file  Transportation Needs: Not on file  Physical Activity: Not on file  Stress: Not on file  Social Connections: Not on file  Intimate Partner Violence: Not on file   Family History  Problem Relation Age of Onset  . Diabetes Mother   . Cancer Father   . Cancer - Lung  Brother       VITAL SIGNS BP 130/80   Pulse 74   Temp (!) 97.1 F (36.2 C)   Resp 20   Ht 5' 11.5" (1.816 m)   Wt 151 lb (68.5 kg)   SpO2 96%   BMI 20.77 kg/m   Outpatient Encounter Medications as of 10/15/2020  Medication Sig  . acetaminophen (TYLENOL) 325 MG tablet Take 2 tablets (650 mg total) by mouth every 6 (six) hours as needed for mild pain (or Fever >/= 101).  Marland Kitchen amLODipine (NORVASC) 10 MG tablet Take 1 tablet (10 mg total) by mouth daily. For BP  . benazepril (LOTENSIN) 20 MG tablet Take 1 tablet (20 mg total) by mouth in the morning.  . bisacodyl (DULCOLAX) 10 MG suppository Place 1 suppository (10 mg total) rectally daily as needed for mild constipation or moderate constipation.  . carbidopa-levodopa (SINEMET IR) 25-100 MG tablet Take 1.5 tablets by mouth 3 (three) times daily.  . cholecalciferol (VITAMIN D) 1000 units tablet Take 1,000 Units by mouth in the morning.   . cyanocobalamin (,VITAMIN B-12,) 1000 MCG/ML injection Inject 1,000 mcg into the muscle every 30 (thirty) days.   . finasteride (PROSCAR) 5 MG tablet Take 1 tablet (5 mg total) by  mouth daily.  . hydrALAZINE (APRESOLINE) 25 MG tablet Take 1.5 tablets (37.5 mg total) by mouth 2 (two) times daily.  Marland Kitchen linaclotide (LINZESS) 145 MCG CAPS capsule Take 1 capsule (145 mcg total) by mouth daily before breakfast.  . memantine (NAMENDA) 10 MG tablet Take 10 mg by mouth daily with lunch.  . Polyvinyl Alcohol-Povidone PF 1.4-0.6 % SOLN Apply 1 drop to eye daily as needed (FOR DRY EYE RELIEF).   Marland Kitchen simethicone (MYLICON) 161 MG chewable tablet Chew 125 mg by mouth every 6 (six) hours as needed for flatulence.  . sodium chloride 1 g tablet Take 1 tablet (1 g total) by mouth 2 (two) times daily with a meal.   No facility-administered encounter medications on file as of 10/15/2020.     SIGNIFICANT DIAGNOSTIC EXAMS  PREVIOUS   10-04-20: chest x-ray:  Bilateral layering pleural effusions. Bilateral lower lobe airspace  opacities. Heart is normal size. Aortic atherosclerosis. No acute bony abnormality.  10-04-20: ct of abdomen and pelvis 1. Masslike process with marginal enhancement and central necrosis centered in the left hepatic lobe, measuring 14.2 by 10.3 by 9.9 cm (volume = 760 cm^3). Possibilities include malignancy or abscess. Correlate with 2. Linear lucency inferiorly in segment 3 of the liver could be from a dilated intrahepatic bile duct or possibly portal vein thrombosis of the associated portal vein tributary. 3. Large bilateral pleural effusions with associated passive atelectasis. These effusions are nonspecific for transudative or exudative etiology. 4. Prominent stool throughout much of the colon, but not in the sigmoid colon or rectum. Borderline rectal wall thickening, without surrounding adenopathy. 5. Other imaging findings of potential clinical significance: Coronary atherosclerosis. Small pericardial effusion. Remote compression fractures at L2, L4, and L5. Lumbar spondylosis and degenerative disc disease causing multilevel impingement. Mild prostatomegaly. 6. Despite efforts by the technologist and patient, motion artifact is present on today's exam and could not be eliminated. This reduces exam sensitivity and specificity. 7. Aortic atherosclerosis.  10-04-20: ct of head:  1. No acute intracranial findings. 2. Periventricular white matter and corona radiata hypodensities favor chronic ischemic microvascular white matter disease. 3. Mild chronic ethmoid sinusitis.  10-07-20: chest x-ray:  Improved resolved right-sided pleural effusion, without pneumothorax. Persistent left pleural effusion, adjacent atelectasis, and interstitial edema.  NO NEW EXAMS   LABS REVIEWED PREVIOUS    10-04-20: wbc 12.0; hgb 12.9; hct 38.8; mcv 96.0 plt 324; glucose 104; bun 18; creat 0.77; k+ 4.2; na++ 121 ca 8.5 GFR >60 alk phos 134; albumin 3.5  Urine culture no growth 10-05-20: vit B 12: 4163 10-06-20: wbc  13.9; hgb 12.3; hct 37.6; mcv 96.2 plt 356; glucose 89; bun 19; creat 0.74; k+ 4.3; na++ 127; ca 8.5 GFR>60 mag 2.0 10-09-20: wbc 11.0; hgb 11.7; hct 36.0; mcv 97.0 plt 342; glucose 88; bun 15; creat 0.72; k+ 3.8; na++ 128; ca 8.3 GFR>60 10-10-20: wbc 10.4; hgb 11.3; hct 35.0; mcv 96.7 plt 359; glucose 88; bun 15; creat 0.72 k+ 3.8; na++ 128; ca 8.3 GFR>60  NO NEW LABS.   Review of Systems  Unable to perform ROS: Dementia (unable to participate )   Physical Exam Constitutional:      General: He is not in acute distress.    Appearance: He is well-developed. He is not diaphoretic.  Neck:     Thyroid: No thyromegaly.  Cardiovascular:     Rate and Rhythm: Normal rate and regular rhythm.     Pulses: Normal pulses.     Heart sounds: Normal heart  sounds.  Pulmonary:     Effort: Pulmonary effort is normal. No respiratory distress.     Breath sounds: Normal breath sounds.  Abdominal:     General: There is distension.     Palpations: Abdomen is soft.     Tenderness: There is no abdominal tenderness.     Comments: Hypoactive   Musculoskeletal:     Cervical back: Neck supple.     Right lower leg: No edema.     Left lower leg: No edema.     Comments: Is able to move all extremities Has abnormal movements present.    Lymphadenopathy:     Cervical: No cervical adenopathy.  Skin:    General: Skin is warm and dry.  Neurological:     Mental Status: He is alert. Mental status is at baseline.  Psychiatric:        Mood and Affect: Mood normal.      ASSESSMENT/ PLAN:  TODAY  1. Chronic idiopathic constipation: is worse: will give MOM 60 mL now; will change the following: miralax to daily and linzess to 290 mcg will monitor his status.    Ok Edwards NP [facility]  Contact [phone] Monday through Friday 8am- 5pm  After hours call [phone]

## 2020-10-17 ENCOUNTER — Encounter: Payer: Self-pay | Admitting: Internal Medicine

## 2020-10-17 ENCOUNTER — Encounter (HOSPITAL_COMMUNITY): Payer: Self-pay

## 2020-10-17 ENCOUNTER — Non-Acute Institutional Stay (SKILLED_NURSING_FACILITY): Payer: Medicare PPO | Admitting: Internal Medicine

## 2020-10-17 DIAGNOSIS — C787 Secondary malignant neoplasm of liver and intrahepatic bile duct: Secondary | ICD-10-CM | POA: Insufficient documentation

## 2020-10-17 DIAGNOSIS — G231 Progressive supranuclear ophthalmoplegia [Steele-Richardson-Olszewski]: Secondary | ICD-10-CM | POA: Diagnosis not present

## 2020-10-17 DIAGNOSIS — E871 Hypo-osmolality and hyponatremia: Secondary | ICD-10-CM | POA: Diagnosis not present

## 2020-10-17 DIAGNOSIS — J90 Pleural effusion, not elsewhere classified: Secondary | ICD-10-CM

## 2020-10-17 NOTE — Progress Notes (Signed)
NURSING HOME LOCATION:  [facility] ROOM NUMBER: 133/P   CODE STATUS:  DNR  PCP: [name], MD   This is a comprehensive admission note to this SNFperformed on this date less than 30 days from date of admission. Included are preadmission medical/surgical history; reconciled medication list; family history; social history and comprehensive review of systems.  Corrections and additions to the records were documented. Comprehensive physical exam was also performed. Additionally a clinical summary was entered for each active diagnosis pertinent to this admission in the Problem List to enhance continuity of care.  HPI: He was hospitalized 3/18-03/29/2022 with severe constipation, difficulty with urination, acute metabolic encephalopathy and acute on chronic hyponatremia.  Family described progressive weakness over several days with decreased interaction.  The patient requires total care 24/7 with family members or paid caregivers. Sodium in the ED was 121; it is typically in the upper 120s.  CT of the abdomen/pelvis revealed significant stool burden and abnormal liver findings suspicious for malignancy versus abscess and bilateral pleural effusions. 3/22 18-gauge core biopsy x3 was performed on the anterior central liver mass.  Right sided thoracentesis was also completed.  AFP was 3.1.  Pleural fluid reveals reactive mesothelial cells. Gastrografin enema resulted in improvement in his constipation.  Linzess was continued but dose reduced. Foley catheter was removed on 3/25 with bladder scan monitor after removal. Patient was very unsteady with increased risk of falling which prompted discharge to SNF. Neurology follow-up at [facility] by Dr. Mickie Bail was to be scheduled for supranuclear palsy.  Past medical and surgical history: Includes chronic low back pain, progressive supranuclear palsy , gait abnormality, neurogenic bladder, history of vitamin B-12 deficiency, dementia, and  essential hypertension. There is a past history of subdural hematoma sustained in a fall.  Additionally he has a diagnosis of neurogenic bladder. Surgeries include cataract extraction bilaterally as well as colonoscopy. Also he did have a melanoma removed from the posterior scalp possibly 6 years ago.  Dr. Dayton Martes is his Dermatologist.  Social history: Nondrinker; never smoked.  Family history: Reviewed; noncontributory due to advanced age.   Review of systems: Patient can provide no meaningful history.  He essentially slept through the interview and exam.  His wife and caregiver were present.  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 There have been no new significant symptoms or signs according to them.  Constipation has essentially resolved with present regimen.  Physical exam:  Pertinent or positive findings: He appears thin but adequately nourished.  As noted he slept throughout the exam.  Arcus senilis is present.  He has a lower partial.  Heart sounds are slightly distant.  He has minimal, low-grade expiratory rhonchi.  He did snore intermittently but there was no associated apnea.  Abdomen is distended but soft.  Posterior tibial pulses are stronger than the dorsalis pedis pulses.  When his feet are touched toes are upgoing.  He has interosseous wasting of the hands.  There is a large well-healed scar over the posterior scalp which was apparently the site of the melanoma removal 6 years ago.  He also has a triangular shaped, dry, erythematous rash over the left malar area.  General appearance: no acute distress, increased work of breathing is present.   Lymphatic: No lymphadenopathy about the head, neck, axilla. Eyes: No conjunctival inflammation or lid edema is present. There is no scleral icterus. Ears:  External ear exam shows no significant lesions or deformities.   Nose:  External  nasal examination shows no deformity or inflammation. Nasal mucosa are pink and  moist without lesions, exudates Oral exam: Lips and gums are healthy appearing.There is no oropharyngeal erythema or exudate. Neck:  No thyromegaly, masses, tenderness noted.    Heart:  Normal rate and regular rhythm. S1 and S2 normal without gallop, murmur, click, rub.  Lungs:  without wheezes,  rales, rubs. Abdomen: Bowel sounds are normal.  Abdomen is soft and nontender with no organomegaly, hernias, masses. GU: Deferred  Extremities:  No cyanosis, clubbing, edema. Neurologic exam: Balance, Rhomberg, finger to nose testing could not be completed due to clinical state Skin: Warm & dry w/o tenting.  See clinical summary under each active problem in the Problem List with associated updated therapeutic plan

## 2020-10-17 NOTE — Patient Instructions (Signed)
See assessment and plan under each diagnosis in the problem list and acutely for this visit 

## 2020-10-17 NOTE — Assessment & Plan Note (Addendum)
Because of his progressive supranuclear palsy associated with gait abnormalities and recurrent falls, neurogenic bladder, and dementia; he is DNR.  Aggressive intervention of metastatic melanoma is not likely to be pursued in this context, especially @ 87.

## 2020-10-17 NOTE — Progress Notes (Signed)
Unable to reach patient due to current [facility] admission. I will plan to meet with the patient during initial visit with Dr. Delton Coombes

## 2020-10-17 NOTE — Assessment & Plan Note (Addendum)
Presentation 3/18 with acute metabolic encephalopathy superimposed on chronic dementia in the context of progressive supranuclear palsy.  Sodium 121; baseline sodium felt to be in the upper 120s.  Sodium was 128 predischarge. Sodium tablets prescribed.

## 2020-10-18 NOTE — Assessment & Plan Note (Signed)
Clinically w/o respiratory compromise @ present

## 2020-10-18 NOTE — Assessment & Plan Note (Signed)
A copy of H&P will be sent to Dr Mickie Bail

## 2020-10-18 DEATH — deceased

## 2020-10-21 ENCOUNTER — Inpatient Hospital Stay (HOSPITAL_COMMUNITY): Payer: Medicare PPO

## 2020-10-21 ENCOUNTER — Inpatient Hospital Stay (HOSPITAL_COMMUNITY): Payer: Medicare PPO | Attending: Hematology | Admitting: Hematology

## 2020-10-21 ENCOUNTER — Other Ambulatory Visit: Payer: Self-pay

## 2020-10-21 ENCOUNTER — Encounter (HOSPITAL_COMMUNITY): Payer: Self-pay | Admitting: Hematology

## 2020-10-21 ENCOUNTER — Other Ambulatory Visit (HOSPITAL_COMMUNITY)
Admission: RE | Admit: 2020-10-21 | Discharge: 2020-10-21 | Disposition: A | Discharge status: Home / Self Care | Payer: Medicare PPO | Source: Skilled Nursing Facility | Attending: Adult Health | Admitting: Adult Health

## 2020-10-21 VITALS — BP 133/55 | HR 96 | Temp 97.1°F | Resp 18 | Ht 71.5 in | Wt 139.7 lb

## 2020-10-21 DIAGNOSIS — Z800 Family history of malignant neoplasm of digestive organs: Secondary | ICD-10-CM | POA: Diagnosis not present

## 2020-10-21 DIAGNOSIS — E538 Deficiency of other specified B group vitamins: Secondary | ICD-10-CM | POA: Diagnosis not present

## 2020-10-21 DIAGNOSIS — Z801 Family history of malignant neoplasm of trachea, bronchus and lung: Secondary | ICD-10-CM | POA: Insufficient documentation

## 2020-10-21 DIAGNOSIS — I700 Atherosclerosis of aorta: Secondary | ICD-10-CM | POA: Insufficient documentation

## 2020-10-21 DIAGNOSIS — C434 Malignant melanoma of scalp and neck: Secondary | ICD-10-CM | POA: Insufficient documentation

## 2020-10-21 DIAGNOSIS — I10 Essential (primary) hypertension: Secondary | ICD-10-CM | POA: Insufficient documentation

## 2020-10-21 DIAGNOSIS — C787 Secondary malignant neoplasm of liver and intrahepatic bile duct: Secondary | ICD-10-CM | POA: Insufficient documentation

## 2020-10-21 DIAGNOSIS — E871 Hypo-osmolality and hyponatremia: Secondary | ICD-10-CM | POA: Diagnosis not present

## 2020-10-21 DIAGNOSIS — J910 Malignant pleural effusion: Secondary | ICD-10-CM | POA: Insufficient documentation

## 2020-10-21 DIAGNOSIS — I2510 Atherosclerotic heart disease of native coronary artery without angina pectoris: Secondary | ICD-10-CM | POA: Insufficient documentation

## 2020-10-21 DIAGNOSIS — G231 Progressive supranuclear ophthalmoplegia [Steele-Richardson-Olszewski]: Secondary | ICD-10-CM | POA: Insufficient documentation

## 2020-10-21 LAB — COMPREHENSIVE METABOLIC PANEL
ALT: 12 U/L (ref 0–44)
AST: 31 U/L (ref 15–41)
Albumin: 4 g/dL (ref 3.5–5.0)
Alkaline Phosphatase: 248 U/L — ABNORMAL HIGH (ref 38–126)
Anion gap: 10 (ref 5–15)
BUN: 17 mg/dL (ref 8–23)
CO2: 22 mmol/L (ref 22–32)
Calcium: 9.4 mg/dL (ref 8.9–10.3)
Chloride: 94 mmol/L — ABNORMAL LOW (ref 98–111)
Creatinine, Ser: 0.67 mg/dL (ref 0.61–1.24)
GFR, Estimated: >60 mL/min (ref >60)
Glucose, Bld: 119 mg/dL — ABNORMAL HIGH (ref 70–99)
Potassium: 4 mmol/L (ref 3.5–5.1)
Sodium: 126 mmol/L — ABNORMAL LOW (ref 135–145)
Total Bilirubin: 0.8 mg/dL (ref 0.3–1.2)
Total Protein: 8.2 g/dL — ABNORMAL HIGH (ref 6.5–8.1)

## 2020-10-21 LAB — CBC WITH DIFFERENTIAL/PLATELET
Abs Immature Granulocytes: 0.41 10*3/uL — ABNORMAL HIGH (ref 0.00–0.07)
Basophils Absolute: 0.1 10*3/uL (ref 0.0–0.1)
Basophils Relative: 1 %
Eosinophils Absolute: 0 10*3/uL (ref 0.0–0.5)
Eosinophils Relative: 0 %
HCT: 46.4 % (ref 39.0–52.0)
Hemoglobin: 14.7 g/dL (ref 13.0–17.0)
Immature Granulocytes: 3 %
Lymphocytes Relative: 3 %
Lymphs Abs: 0.4 10*3/uL — ABNORMAL LOW (ref 0.7–4.0)
MCH: 31 pg (ref 26.0–34.0)
MCHC: 31.7 g/dL (ref 30.0–36.0)
MCV: 97.9 fL (ref 80.0–100.0)
Monocytes Absolute: 0.9 10*3/uL (ref 0.1–1.0)
Monocytes Relative: 7 %
Neutro Abs: 10.9 10*3/uL — ABNORMAL HIGH (ref 1.7–7.7)
Neutrophils Relative %: 86 %
Platelets: 366 10*3/uL (ref 150–400)
RBC: 4.74 MIL/uL (ref 4.22–5.81)
RDW: 13.5 % (ref 11.5–15.5)
WBC: 12.7 10*3/uL — ABNORMAL HIGH (ref 4.0–10.5)
nRBC: 0 % (ref 0.0–0.2)

## 2020-10-21 LAB — BASIC METABOLIC PANEL
Anion gap: 10 (ref 5–15)
BUN: 15 mg/dL (ref 8–23)
CO2: 21 mmol/L — ABNORMAL LOW (ref 22–32)
Calcium: 8.7 mg/dL — ABNORMAL LOW (ref 8.9–10.3)
Chloride: 96 mmol/L — ABNORMAL LOW (ref 98–111)
Creatinine, Ser: 0.56 mg/dL — ABNORMAL LOW (ref 0.61–1.24)
GFR, Estimated: >60 mL/min (ref >60)
Glucose, Bld: 91 mg/dL (ref 70–99)
Potassium: 3.8 mmol/L (ref 3.5–5.1)
Sodium: 127 mmol/L — ABNORMAL LOW (ref 135–145)

## 2020-10-21 LAB — TSH: TSH: 9.452 u[IU]/mL — ABNORMAL HIGH (ref 0.350–4.500)

## 2020-10-21 LAB — SODIUM, URINE, RANDOM: Sodium, Ur: 50 mmol/L

## 2020-10-21 LAB — OSMOLALITY: Osmolality: 278 mOsm/kg (ref 275–295)

## 2020-10-21 LAB — LACTATE DEHYDROGENASE: LDH: 185 U/L (ref 98–192)

## 2020-10-21 LAB — OSMOLALITY, URINE: Osmolality, Ur: 512 mOsm/kg (ref 300–900)

## 2020-10-21 NOTE — Progress Notes (Signed)
Caris testing ordered per Dr. Katragadda 

## 2020-10-21 NOTE — Patient Instructions (Signed)
[place] at [facility] Discharge Instructions  You were seen and examined today by Dr. Delton Coombes. Dr. Delton Coombes is a medical oncologist, meaning he specializes in the management of cancer diagnoses with medications. Dr. Delton Coombes discussed your past medical history, family history of cancer and the events that led to you being here today.  You were referred to Dr. Delton Coombes by Dr. Dereck Leep based on your recently liver biopsy results which revealed a type of melanoma. Dr. Delton Coombes has recommended additional lab work as well as additional urine studies. Dr. Delton Coombes has also recommended a CT of your chest as well as a brain MRI to complete the staging process.   Because the melanoma is present in the liver, it is considered a Stage IV, aggressive cancer. This is normally treated with immunotherapy and typically results in good response. The question is now if you would be able to tolerate treatment. A combination of two immunotherapy for 4 treatments every 3 weeks followed by one singular immunotherapy drug given every 3 weeks. Because this is stage IV cancer, you would need to be on some form of therapy for the remainder of your lifetime. Side effects of immunotherapy are typically milder than those of chemotherapy but can cause fatigue, abnormal thyroid function, dry/itchy skin. Less common side effects include diarrhea and inflammation of the pituitary gland (<1% chance). Cancer itself can cause fatigue, treating the cancer could improve cancer-related fatigue. 50% of patients undergoing treatment for Stage IV melanoma, live past 5 years. Because of the PSP diagnosis, it is difficult to discern if the fatigue is related to the PSP or the cancer itself.  Without treatment, the cancer would continue to spread and prognosis would most likely be 3 to 6 months.   Follow-up as scheduled.   Thank you for choosing [place] at [facility] to provide your  oncology and hematology care.  To afford each patient quality time with our provider, please arrive at least 15 minutes before your scheduled appointment time.   If you have a lab appointment with the [facility] please come in thru the Main Entrance and check in at the main information desk.  You need to re-schedule your appointment should you arrive 10 or more minutes late.  We strive to give you quality time with our providers, and arriving late affects you and other patients whose appointments are after yours.  Also, if you no show three or more times for appointments you may be dismissed from the clinic at the providers discretion.     Again, thank you for choosing [facility].  Our hope is that these requests will decrease the amount of time that you wait before being seen by our physicians.       _____________________________________________________________  Should you have questions after your visit to [facility], please contact our office at [phone] and follow the prompts.  Our office hours are 8:00 a.m. and 4:30 p.m. Monday - Friday.  Please note that voicemails left after 4:00 p.m. may not be returned until the following business day.  We are closed weekends and major holidays.  You do have access to a nurse 24-7, just call the main number to the clinic [phone] and do not press any options, hold on the line and a nurse will answer the phone.    For prescription refill requests, have your pharmacy contact our office and allow 72 hours.    Due to Covid,  you will need to wear a mask upon entering the hospital. If you do not have a mask, a mask will be given to you at the Main Entrance upon arrival. For doctor visits, patients may have 1 support person age 38 or older with them. For treatment visits, patients can not have anyone with them due to social distancing guidelines and our immunocompromised population.

## 2020-10-21 NOTE — Progress Notes (Signed)
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 Luis Freeman 87 y.o. male is here because of evaluation of metastatic melanoma to liver, at the request of Dr. Hildred Laser. He was hospitalized from 03/18 to 10/07/2020 at [facility] for severe constipation, difficulty urinating, acute metabolic encephalopathy and acute on chronic hyponatremia. He has a history of melanoma removed from his posterior scalp in 2016 by Dr. Dayton Martes.  Today he is accompanied by his daughter, Karie Kirks, and he is nonverbal. He currently lives in the [facility]. According to his daughter, he is extremely sleeping. His family noticed that he developed constipation for 5 days prior to being brought to urgent care on 03/18; the family tried also suppositories and an enema, but it did not help. He is currently on Myrbetriq due to his not tolerating Flomax which was causing seizures. 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 Regis Bill and Dr. Jannifer Franklin; his symptoms included shuffling gait, gait imbalance with falls and speech difficulty. Prior to this admission, he was able to ride his stationary bike and walked with a walker to the mailbox daily, though not very verbal and is recognizing his family members; he is not currently walking with a walker at the [facility] and is mainly sedentary. His appetite is excellent and was in the 150's prior to this admission and he is having  BM's now. His daughter denies him having any autoimmune disorders including lupus or Crohn's.  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 He is a non-smoker. His father deceased from gastric cancer in his early 14's; his brother had lung cancer after smoking; his sister also had lung cancer due to smoking.  MEDICAL HISTORY:  Past Medical History:  Diagnosis Date  . Chronic low back pain   . Gait abnormality 11/15/2019  . Hypertension   . PSP (progressive supranuclear palsy) ([place]) 11/15/2019  . Vitamin B12 deficiency     SURGICAL HISTORY: Past Surgical History:  Procedure Laterality Date  . CATARACT EXTRACTION, BILATERAL    . COLONOSCOPY    . COLONOSCOPY  02/04/2011   Procedure: COLONOSCOPY;  Surgeon: Daneil Dolin, MD;  Location: [facility];  Service: Endoscopy;  Laterality: N/A;  . removal of malignant melanoma  2016    SOCIAL HISTORY: Social History   Socioeconomic History  . Marital status: Married    Spouse name: Not on file  . Number of children: 4  . Years of education: Not on file  . Highest education level: Not on file  Occupational History  . Occupation: retired  Tobacco Use  . Smoking status: Never Smoker  . Smokeless tobacco: Never Used  Vaping Use  . Vaping Use: Never used  Substance and Sexual Activity  . Alcohol use: Not Currently    Alcohol/week: 0.0 standard drinks  . Drug use: No  . Sexual activity: Not Currently  Other Topics Concern  . Not on file  Social History Narrative  .  Not on file   Social Determinants of Health   Financial Resource Strain: Low Risk   . Difficulty of Paying Living Expenses: Not hard at all  Food Insecurity: No Food Insecurity  . Worried About Charity fundraiser in the Last Year: Never true  . Ran Out of Food in the Last Year: Never true  Transportation Needs: No Transportation Needs  . Lack of Transportation (Medical): No  . Lack of Transportation  (Non-Medical): No  Physical Activity: Inactive  . Days of Exercise per Week: 0 days  . Minutes of Exercise per Session: 0 min  Stress: No Stress Concern Present  . Feeling of Stress : Only a little  Social Connections: Socially Integrated  . Frequency of Communication with Friends and Family: More than three times a week  . Frequency of Social Gatherings with Friends and Family: More than three times a week  . Attends Religious Services: More than 4 times per year  . Active Member of Clubs or Organizations: Yes  . Attends Archivist Meetings: Not on file  . Marital Status: Married  Human resources officer Violence: Not At Risk  . Fear of Current or Ex-Partner: No  . Emotionally Abused: No  . Physically Abused: No  . Sexually Abused: No    FAMILY HISTORY: Family History  Problem Relation Age of Onset  . Diabetes Mother   . Cancer Father   . Cancer - Lung Brother     ALLERGIES:  has No Known Allergies.  MEDICATIONS:  No current outpatient medications on file.   No current facility-administered medications for this visit.    REVIEW OF SYSTEMS:   Review of Systems  Constitutional: Positive for fatigue. Negative for appetite change.  Neurological: Positive for extremity weakness.  Psychiatric/Behavioral: Positive for sleep disturbance. The patient is nervous/anxious.   All other systems reviewed and are negative.    PHYSICAL EXAMINATION: ECOG PERFORMANCE STATUS: 3 - Symptomatic, >50% confined to bed  Vitals:   10/21/20 0759  BP: (!) 133/55  Pulse: 96  Resp: 18  Temp: (!) 97.1 F (36.2 C)  SpO2: 99%   Filed Weights   10/21/20 0759  Weight: 139 lb 11.2 oz (63.4 kg)   Physical Exam Vitals reviewed.  Constitutional:      Comments: In wheelchair  Abdominal:     General: There is distension.     Tenderness: There is abdominal tenderness.  Neurological:     Mental Status: He is lethargic.  Psychiatric:        Speech: He is noncommunicative.      LABORATORY DATA:  I have reviewed the data as listed CBC Latest Ref Rng & Units 10/10/2020 10/09/2020 10/08/2020  WBC 4.0 - 10.5 K/uL 10.4 11.0(H) 11.0(H)  Hemoglobin 13.0 - 17.0 g/dL 11.3(L) 11.7(L) 11.2(L)  Hematocrit 39.0 - 52.0 % 35.0(L) 36.0(L) 34.0(L)  Platelets 150 - 400 K/uL 359 342 340   CMP Latest Ref Rng & Units 10/21/2020 10/16/2020 10/10/2020  Glucose 70 - 99 mg/dL 91 92 88  BUN 8 - 23 mg/dL 15 14 15   Creatinine 0.61 - 1.24 mg/dL 0.56(L) 0.51(L) 0.72  Sodium 135 - 145 mmol/L 127(L) 127(L) 128(L)  Potassium 3.5 - 5.1 mmol/L 3.8 4.3 3.8  Chloride 98 - 111 mmol/L 96(L) 96(L) 96(L)  CO2 22 - 32 mmol/L 21(L) 22 21(L)  Calcium 8.9 - 10.3 mg/dL 8.7(L) 8.8(L) 8.3(L)  Total Protein 6.5 - 8.1 g/dL - - -  Total Bilirubin 0.3 - 1.2 mg/dL - - -  Alkaline Phos 38 - 126 U/L - - -  AST 15 - 41 U/L - - -  ALT 0 - 44 U/L - - -   Surgical pathology ([id number]) on 10/08/2020: Central anterior mass of liver: metastatic melanoma.  RADIOGRAPHIC STUDIES: I have personally reviewed the radiological images as listed and agreed with the findings in the report. DG Chest 1 View  Result Date: 10/07/2020 CLINICAL DATA:  Status post right-sided thoracentesis EXAM: CHEST  1 VIEW COMPARISON:  1 day prior earlier today at 5:02 a.m. FINDINGS: Midline trachea. Mild cardiomegaly. Layering small left pleural effusion is unchanged. Significant improvement to resolution in right-sided pleural fluid. Skin fold over the chest bilaterally, but no pneumothorax. Underlying mild interstitial edema. Left base airspace disease persists. IMPRESSION: Improved resolved right-sided pleural effusion, without pneumothorax. Persistent left pleural effusion, adjacent atelectasis, and interstitial edema. Electronically Signed   By: Abigail Miyamoto M.D.   On: 10/07/2020 10:20   DG Abd 1 View  Result Date: 10/04/2020 CLINICAL DATA:  Lack of bowel movement EXAM: ABDOMEN - 1 VIEW COMPARISON:  02/27/2020 FINDINGS: Prominent stool  throughout the colon favors constipation. Thoracic and lumbar spondylosis. Likely chronic suspected lumbar compression fractures at L2, L4, and L5. Poor definition of the left hemidiaphragm, cannot exclude left basilar airspace opacity, consider chest radiography for further workup. Questionable blunting of the right lateral costophrenic angle. IMPRESSION: 1. Prominent stool throughout the colon favors constipation. 2. Poor definition of the left hemidiaphragm, cannot exclude left basilar airspace opacity, consider chest radiography for further workup. 3. Possible small right pleural effusion. 4. Likely chronic lumbar compression fractures at L2, L4, and L5. Electronically Signed   By: Van Clines M.D.   On: 10/04/2020 17:48   CT Head Wo Contrast  Result Date: 10/04/2020 CLINICAL DATA:  Abdominal distension with altered mental status EXAM: CT HEAD WITHOUT CONTRAST TECHNIQUE: Contiguous axial images were obtained from the base of the skull through the vertex without intravenous contrast. COMPARISON:  CT head 02/27/2020 FINDINGS: Brain: Periventricular white matter and corona radiata hypodensities favor chronic ischemic microvascular white matter disease. Otherwise, the brainstem, cerebellum, cerebral peduncles, thalamus, basal ganglia, basilar cisterns, and ventricular system appear within normal limits. No intracranial hemorrhage, mass lesion, or acute CVA. Vascular: There is atherosclerotic calcification of the cavernous carotid arteries bilaterally. Skull: Unremarkable Sinuses/Orbits: Mild chronic ethmoid sinusitis. Other: No supplemental non-categorized findings. IMPRESSION: 1. No acute intracranial findings. 2. Periventricular white matter and corona radiata hypodensities favor chronic ischemic microvascular white matter disease. 3. Mild chronic ethmoid sinusitis. Electronically Signed   By: Van Clines M.D.   On: 10/04/2020 22:08   CT ABDOMEN PELVIS W CONTRAST  Result Date:  10/04/2020 CLINICAL DATA:  Abdominal distention and altered mental status, constipation. EXAM: CT ABDOMEN AND PELVIS WITH CONTRAST TECHNIQUE: Multidetector CT imaging of the abdomen and pelvis was performed using the standard protocol following bolus administration of intravenous contrast. CONTRAST:  168m OMNIPAQUE IOHEXOL 300 MG/ML  SOLN COMPARISON:  Abdomen radiograph 02/27/2020 FINDINGS: Despite efforts by the technologist and patient, motion artifact is present on today's exam and could not be eliminated. This reduces exam sensitivity and specificity. Lower chest: Large bilateral pleural effusions with associated passive atelectasis. These effusions are nonspecific for transudative or exudative etiology. Left anterior descending and right coronary artery atherosclerosis along with descending thoracic aortic atherosclerosis. Small pericardial effusion. Hepatobiliary: Masslike process with marginal enhancement and central hypoenhancement/central necrosis centered in the left hepatic lobe, measuring 14.2 by 10.3 by 9.9 cm on image 51 series 5.  Possibilities include malignancy or abscess. Linear lucency inferiorly in segment 3 of the liver could be from a prominent intrahepatic bile duct or possibly portal vein thrombosis of the associated portal vein tributary. The main portal vein is patent. Gallbladder grossly unremarkable, somewhat blurred by motion artifact. Pancreas: Unremarkable Spleen: Unremarkable. Adrenals/Urinary Tract: Both adrenal glands appear normal. Simple appearing cysts of the right kidney. Urinary bladder unremarkable. Stomach/Bowel: Prominent stool throughout much of the colon, but not in the sigmoid colon or rectum. It is difficult to identify the site of transition due to the motion artifact in today's exam. However, it is probably just distal to the splenic flexure. Borderline rectal wall thickening, without surrounding adenopathy. Vascular/Lymphatic: Aortoiliac atherosclerotic vascular  disease. Reproductive: Mild prostatomegaly. Other: Mild diffuse subcutaneous and mesenteric edema. Musculoskeletal: Remote compression fractures at L2, L4, and L5. Lumbar spondylosis and degenerative disc disease causing multilevel impingement. IMPRESSION: 1. Masslike process with marginal enhancement and central necrosis centered in the left hepatic lobe, measuring 14.2 by 10.3 by 9.9 cm (volume = 760 cm^3). Possibilities include malignancy or abscess. Correlate with 2. Linear lucency inferiorly in segment 3 of the liver could be from a dilated intrahepatic bile duct or possibly portal vein thrombosis of the associated portal vein tributary. 3. Large bilateral pleural effusions with associated passive atelectasis. These effusions are nonspecific for transudative or exudative etiology. 4. Prominent stool throughout much of the colon, but not in the sigmoid colon or rectum. Borderline rectal wall thickening, without surrounding adenopathy. 5. Other imaging findings of potential clinical significance: Coronary atherosclerosis. Small pericardial effusion. Remote compression fractures at L2, L4, and L5. Lumbar spondylosis and degenerative disc disease causing multilevel impingement. Mild prostatomegaly. 6. Despite efforts by the technologist and patient, motion artifact is present on today's exam and could not be eliminated. This reduces exam sensitivity and specificity. 7. Aortic atherosclerosis. Aortic Atherosclerosis (ICD10-I70.0). Electronically Signed   By: Van Clines M.D.   On: 10/04/2020 22:05   US BIOPSY (LIVER)  Result Date: 10/08/2020 INDICATION: Indeterminate anterior large left liver mass EXAM: ULTRASOUND GUIDED CORE BIOPSY OF ANTERIOR LEFT LIVER MASS MEDICATIONS: 1% LIDOCAINE LOCAL ANESTHESIA/SEDATION: Moderate Sedation Time:  None. The patient was continuously monitored during the procedure by the interventional radiology nurse under my direct supervision. FLUOROSCOPY TIME:  Fluoroscopy Time:  None. COMPLICATIONS: None immediate. PROCEDURE: The procedure, risks, benefits, and alternatives were explained to the patient. Questions regarding the procedure were encouraged and answered. The patient understands and consents to the procedure. Previous imaging reviewed. Preliminary ultrasound performed. The large anterior left hepatic mass was localized from a subcostal anterior approach. Under sterile conditions and local anesthesia, a 17 gauge 6.8 cm access was advanced into the lesion. Needle position confirmed with ultrasound. 18 gauge core biopsies obtained. These were intact and non fragmented. Samples placed in formalin. Needle tract occluded with Gel-Foam. Postprocedure imaging demonstrates no hemorrhage or hematoma. Patient tolerated biopsy well. FINDINGS: Imaging confirms needle placement in the anterior large left liver lesion for core biopsy IMPRESSION: Successful ultrasound anterior left hepatic mass 18 gauge core biopsy Electronically Signed   By: Jerilynn Mages.  Shick M.D.   On: 10/08/2020 11:44   DG CHEST PORT 1 VIEW  Result Date: 10/07/2020 CLINICAL DATA:  Pleural effusion EXAM: PORTABLE CHEST 1 VIEW COMPARISON:  October 04, 2020 FINDINGS: Pleural effusions again noted bilaterally, layering. There is persistent airspace opacity in the medial left base. Bilateral apical pleural thickening is stable. No new opacity. Heart is upper normal in size with pulmonary vascularity normal. No  adenopathy. There is aortic atherosclerosis. Bones are osteoporotic. IMPRESSION: Layering pleural effusions bilaterally. Airspace opacity left lower lobe which may represent atelectasis with potential underlying pneumonia. No new opacity. Stable cardiac silhouette. Aortic Atherosclerosis (ICD10-I70.0). Electronically Signed   By: Lowella Grip III M.D.   On: 10/07/2020 07:59   DG Chest Port 1 View  Result Date: 10/04/2020 CLINICAL DATA:  Weakness EXAM: PORTABLE CHEST 1 VIEW COMPARISON:  10/20/2015 FINDINGS: Bilateral  layering pleural effusions. Bilateral lower lobe airspace opacities. Heart is normal size. Aortic atherosclerosis. No acute bony abnormality. IMPRESSION: Bilateral layering effusions with bibasilar atelectasis or infiltrates. Electronically Signed   By: Rolm Baptise M.D.   On: 10/04/2020 19:45   DG BE (COLON)W SINGLE CM (SOL OR THIN BA)  Result Date: 10/07/2020 CLINICAL DATA:  Constipation with lack of bowel movements for 8 days. Hepatic mass on CT. EXAM: WATER SOLUBLE CONTRAST ENEMA TECHNIQUE: Single-contrast non prepped barium enema was performed for both therapeutic and attempted diagnostic purposes. FLUOROSCOPY TIME:  Fluoroscopy Time:  3 minutes and 30 seconds Radiation Exposure Index (if provided by the fluoroscopic device): 4 Number of Acquired Spot Images: Not applicable. COMPARISON:  Abdominopelvic CT of 10/04/2020. FINDINGS: Preprocedure scout film demonstrates a large amount of colonic stool. No bowel obstruction. Focused, limited single-contrast exam performed with the patient in a left lateral and supine position. Limitations secondary to patient's age and clinical status. The rectum and sigmoid are grossly normal, without circumferential narrowing. From the sigmoid proximally, the colon is stool filled. Contrast filled to approximately the level of the mid transverse colon. IMPRESSION: 1. Focused therapeutic and less so diagnostic non prepped barium enema performed. 2. No evidence of circumferential or obstructive mass, especially within the rectum or sigmoid. Electronically Signed   By: Abigail Miyamoto M.D.   On: 10/07/2020 11:46   [place] EKG SITE RITE  Result Date: 09/30/2020 If Site Rite image not attached, placement could not be confirmed due to current cardiac rhythm.  US THORACENTESIS ASP PLEURAL SPACE W/IMG GUIDE  Result Date: 10/07/2020 INDICATION: Bilateral pleural fluid. EXAM: ULTRASOUND GUIDED RIGHT filled THORACENTESIS MEDICATIONS: None. COMPLICATIONS: None immediate. PROCEDURE: An  ultrasound guided thoracentesis was thoroughly discussed with the patient and the patient's daughter and questions answered. The benefits, risks, alternatives and complications were also discussed. The patient understands and wishes to proceed with the procedure. Written consent was obtained. Ultrasound was performed to localize and mark an adequate pocket of fluid in the right chest. The area was then prepped and draped in the normal sterile fashion. 1% Lidocaine was used for local anesthesia. Under ultrasound guidance a 19 gauge, 7-cm, Yueh catheter was introduced. Thoracentesis was performed. The catheter was removed and a dressing applied. FINDINGS: A total of approximately 1.2 of yellow fluid was removed. Samples were sent to the laboratory as requested by the clinical team. IMPRESSION: Successful ultrasound guided right thoracentesis yielding 1.2 L of pleural fluid. Electronically Signed   By: Abigail Miyamoto M.D.   On: 10/07/2020 10:22    ASSESSMENT:  1.  Metastatic melanoma to liver: -Melanoma of the scalp [facility] in [facility] at [facility] [place]. -Recent hospitalization from 10/04/2020 through 09/26/2020 with severe constipation which relieved with Gastrografin enema. -CTAP on 10/04/2020 during recent hospitalization showed liver mass in the left hepatic lobe measuring 14.2 x 10.3 x 9.9 cm.  Large bilateral pleural effusions. -US guided biopsy on 10/08/2020 of the liver lesion consistent with metastatic melanoma.  2.  Progressive supranuclear palsy: -He follows with Dr. Mickie Bail at  [facility]. -Changes in speech were noted in 2018 and progressive trouble with fine motor skills, walking and balance.  He had a significant functional decline.  Prior to recent hospitalization he had a person watch him 24/7 since February 20, 2020.  Prior to hospitalization he was walking with help of walker. -He is on carbidopa-levodopa 3 times a day.  He is also taking memantine.  3.  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  Non-smoker. -Father died of stomach cancer in his early 7s.  Brother and sister had lung cancer and both were smokers.    PLAN:  1.  Metastatic melanoma to liver: -We discussed the findings on the CT scans and pathology report in detail. -Recommend CT chest with contrast and MRI of the brain with and without contrast to complete staging work-up. -Recommend BRAF V600 mutation testing. -Discussed normal prognosis of metastatic melanoma.  Also discussed the usual treatment options of combination immunotherapy. -He is currently a resident at [facility] since his recent hospitalization with plan to go home. -Should his functional status improve, we also discussed possibility of immunotherapy.  However I would like to talk to Dr.Sklerov about his prognosis from progressive supranuclear palsy. -We will also check LDH level today.  RTC after scans.  2.  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 -We will check serum osmolality, TSH, urine osmolality and urine sodium.    All questions were answered. The patient knows to call the clinic with any problems, questions or concerns.   Derek Jack, MD, 10/21/20 9:31 AM  [place] [phone]   I, Milinda Antis, am acting as a scribe for Dr. Sanda Linger.  I, Derek Jack MD, have reviewed the above documentation for accuracy and completeness, and I agree with the above.

## 2020-10-24 ENCOUNTER — Encounter: Payer: Self-pay | Admitting: Adult Health

## 2020-10-24 ENCOUNTER — Non-Acute Institutional Stay (SKILLED_NURSING_FACILITY): Payer: Medicare PPO | Admitting: Adult Health

## 2020-10-24 DIAGNOSIS — E039 Hypothyroidism, unspecified: Secondary | ICD-10-CM | POA: Diagnosis not present

## 2020-10-24 DIAGNOSIS — G231 Progressive supranuclear ophthalmoplegia [Steele-Richardson-Olszewski]: Secondary | ICD-10-CM | POA: Diagnosis not present

## 2020-10-24 DIAGNOSIS — C787 Secondary malignant neoplasm of liver and intrahepatic bile duct: Secondary | ICD-10-CM | POA: Diagnosis not present

## 2020-10-24 DIAGNOSIS — I700 Atherosclerosis of aorta: Secondary | ICD-10-CM | POA: Diagnosis not present

## 2020-10-24 NOTE — Progress Notes (Signed)
Location:  [place] Room Number: 342 Place of Service:  SNF (31)   CODE STATUS: dnr   No Known Allergies  Chief Complaint  Patient presents with  . Acute Visit    Care plan meeting.     HPI:  We have come together for his care plan meeting. Family present.  unable to do BIMS or mood. He is nonverbal. He requires extensive to dependent assist with his adls. He requires assistance with eating. He is incontinent of bladder and bowel. There have been no falls. He does have sitters 24 hours daily. His bladder scans are as needed.  He remains on 1500 cc fluid restriction. Therapy adl is max assist; upper body lower body;  brp max assist; transfers max assist of one person; he is not ambulatory at this time; will march in place in parallel bars; family helps with leg exercises daily.   Weight regular diet good appetite; weight stable 147.8 pounds.   His TSH is elevated at 9.452; will need replacement therapy. he continues to be followed for his chronic illnesses including:  Metastatic melanoma to liver PSP (progressive supranuclear palsy)  Aortic atherosclerosis   Acquired hypothyroidism   Past Medical History:  Diagnosis Date  . Chronic low back pain   . Gait abnormality 11/15/2019  . Hypertension   . PSP (progressive supranuclear palsy) ([place]) 11/15/2019  . Vitamin B12 deficiency     Past Surgical History:  Procedure Laterality Date  . CATARACT EXTRACTION, BILATERAL    . COLONOSCOPY    . COLONOSCOPY  02/04/2011   Procedure: COLONOSCOPY;  Surgeon: Daneil Dolin, MD;  Location: [facility];  Service: Endoscopy;  Laterality: N/A;  . removal of malignant melanoma  2016    Social History   Socioeconomic History  . Marital status: Married    Spouse name: Not on file  . Number of children: 4  . Years of education: Not on file  . Highest education level: Not on file  Occupational History  . Occupation: retired  Tobacco Use  . Smoking status: Never Smoker   . Smokeless tobacco: Never Used  Vaping Use  . Vaping Use: Never used  Substance and Sexual Activity  . Alcohol use: Not Currently    Alcohol/week: 0.0 standard drinks  . Drug use: No  . Sexual activity: Not Currently  Other Topics Concern  . Not on file  Social History Narrative  . Not on file   Social Determinants of Health   Financial Resource Strain: Low Risk   . Difficulty of Paying Living Expenses: Not hard at all  Food Insecurity: No Food Insecurity  . Worried About Charity fundraiser in the Last Year: Never true  . Ran Out of Food in the Last Year: Never true  Transportation Needs: No Transportation Needs  . Lack of Transportation (Medical): No  . Lack of Transportation (Non-Medical): No  Physical Activity: Inactive  . Days of Exercise per Week: 0 days  . Minutes of Exercise per Session: 0 min  Stress: No Stress Concern Present  . Feeling of Stress : Only a little  Social Connections: Socially Integrated  . Frequency of Communication with Friends and Family: More than three times a week  . Frequency of Social Gatherings with Friends and Family: More than three times a week  . Attends Religious Services: More than 4 times per year  . Active Member of Clubs or Organizations: Yes  . Attends Archivist Meetings:  Not on file  . Marital Status: Married  Human resources officer Violence: Not At Risk  . Fear of Current or Ex-Partner: No  . Emotionally Abused: No  . Physically Abused: No  . Sexually Abused: No   Family History  Problem Relation Age of Onset  . Diabetes Mother   . Cancer Father   . Cancer - Lung Brother       VITAL SIGNS BP 120/62   Pulse 66   Temp 98.2 F (36.8 C)   Resp 18   Ht 5' 11.5" (1.816 m)   Wt 147 lb 12.8 oz (67 kg)   SpO2 95%   BMI 20.33 kg/m   Outpatient Encounter Medications as of 10/24/2020  Medication Sig  . acetaminophen (TYLENOL) 325 MG tablet Take 2 tablets (650 mg total) by mouth every 6 (six) hours as needed for  mild pain (or Fever >/= 101).  Marland Kitchen amLODipine (NORVASC) 10 MG tablet Take 1 tablet (10 mg total) by mouth daily. For BP  . Balsam [place]-Castor Oil (VENELEX) OINT Apply topically. Special Instructions: Apply Venelex to sacrum and buttocks Q shift and after each incontinence episode.  . bisacodyl (DULCOLAX) 10 MG suppository Place 1 suppository (10 mg total) rectally daily as needed for mild constipation or moderate constipation.  . carbidopa-levodopa (SINEMET IR) 25-100 MG tablet Take 1.5 tablets by mouth 3 (three) times daily.  . cholecalciferol (VITAMIN D) 1000 units tablet Take 1,000 Units by mouth in the morning.   . Cranberry 500 MG CAPS Take 1 capsule by mouth daily.  . cyanocobalamin (,VITAMIN B-12,) 1000 MCG/ML injection Inject 1,000 mcg into the muscle every 30 (thirty) days.   . finasteride (PROSCAR) 5 MG tablet Take 1 tablet (5 mg total) by mouth daily.  . hydrALAZINE (APRESOLINE) 25 MG tablet Take 1.5 tablets (37.5 mg total) by mouth 2 (two) times daily.  Marland Kitchen linaclotide (LINZESS) 290 MCG CAPS capsule Take 290 mcg by mouth daily before breakfast.  . lisinopril (ZESTRIL) 20 MG tablet Take 20 mg by mouth daily.  . memantine (NAMENDA) 10 MG tablet Take 10 mg by mouth daily with lunch.  . NON FORMULARY Diet Regular  . NON FORMULARY Fluid restrictions 1500CC in 24 hours. Dietary 500cc Nursing provider 7a-3p 420cc and 3p-11p 420cc and 11p-7a 160cc . Every Shift Day, Evening, Night  . polyethylene glycol (MIRALAX / GLYCOLAX) 17 g packet Take 17 g by mouth daily.  . Polyvinyl Alcohol-Povidone PF 1.4-0.6 % SOLN Apply 1 drop to eye daily as needed (FOR DRY EYE RELIEF).   Marland Kitchen simethicone (MYLICON) 364 MG chewable tablet Chew 125 mg by mouth every 6 (six) hours as needed for flatulence.  . sodium chloride 1 g tablet Take 1 g by mouth 3 (three) times daily.  . vitamin C (ASCORBIC ACID) 500 MG tablet Take 500 mg by mouth daily.   No facility-administered encounter medications on file as of 10/24/2020.      SIGNIFICANT DIAGNOSTIC EXAMS   PREVIOUS   10-04-20: chest x-ray:  Bilateral layering pleural effusions. Bilateral lower lobe airspace opacities. Heart is normal size. Aortic atherosclerosis. No acute bony abnormality.  10-04-20: ct of abdomen and pelvis 1. Masslike process with marginal enhancement and central necrosis centered in the left hepatic lobe, measuring 14.2 by 10.3 by 9.9 cm (volume = 760 cm^3). Possibilities include malignancy or abscess. Correlate with 2. Linear lucency inferiorly in segment 3 of the liver could be from a dilated intrahepatic bile duct or possibly portal vein thrombosis of the associated portal  vein tributary. 3. Large bilateral pleural effusions with associated passive atelectasis. These effusions are nonspecific for transudative or exudative etiology. 4. Prominent stool throughout much of the colon, but not in the sigmoid colon or rectum. Borderline rectal wall thickening, without surrounding adenopathy. 5. Other imaging findings of potential clinical significance: Coronary atherosclerosis. Small pericardial effusion. Remote compression fractures at L2, L4, and L5. Lumbar spondylosis and degenerative disc disease causing multilevel impingement. Mild prostatomegaly. 6. Despite efforts by the technologist and patient, motion artifact is present on today's exam and could not be eliminated. This reduces exam sensitivity and specificity. 7. Aortic atherosclerosis.  10-04-20: ct of head:  1. No acute intracranial findings. 2. Periventricular white matter and corona radiata hypodensities favor chronic ischemic microvascular white matter disease. 3. Mild chronic ethmoid sinusitis.  10-07-20: chest x-ray:  Improved resolved right-sided pleural effusion, without pneumothorax. Persistent left pleural effusion, adjacent atelectasis, and interstitial edema.  NO NEW EXAMS   LABS REVIEWED PREVIOUS    10-04-20: wbc 12.0; hgb 12.9; hct 38.8; mcv 96.0 plt 324; glucose  104; bun 18; creat 0.77; k+ 4.2; na++ 121 ca 8.5 GFR >60 alk phos 134; albumin 3.5  Urine culture no growth 10-05-20: vit B 12: 4163 10-06-20: wbc 13.9; hgb 12.3; hct 37.6; mcv 96.2 plt 356; glucose 89; bun 19; creat 0.74; k+ 4.3; na++ 127; ca 8.5 GFR>60 mag 2.0 10-09-20: wbc 11.0; hgb 11.7; hct 36.0; mcv 97.0 plt 342; glucose 88; bun 15; creat 0.72; k+ 3.8; na++ 128; ca 8.3 GFR>60 10-10-20: wbc 10.4; hgb 11.3; hct 35.0; mcv 96.7 plt 359; glucose 88; bun 15; creat 0.72 k+ 3.8; na++ 128; ca 8.3 GFR>60 10-16-20: glucose 92; bun 14; creat 0.51; k+ 4.3; na++ 127; ca 8.8 GFR>60; mag 2.0  TODAY  10-21-20: wbc 12.7; hgb 14.7; hct 46.4; mcv 97.9 plt 366; glucose 91; bun 15; creat 0.56; k+ 3.8; na++ 127; ca 8.7 GFR>60; alk phos 248; albumin 4.0; LDH 185; tsh 9.452    Review of Systems  Unable to perform ROS: Dementia (unable to participate )     Physical Exam Constitutional:      General: He is not in acute distress.    Appearance: He is well-developed. He is not diaphoretic.  Neck:     Thyroid: No thyromegaly.  Cardiovascular:     Rate and Rhythm: Normal rate and regular rhythm.     Pulses: Normal pulses.     Heart sounds: Normal heart sounds.  Pulmonary:     Effort: Pulmonary effort is normal. No respiratory distress.     Breath sounds: Normal breath sounds.  Abdominal:     General: Bowel sounds are normal. There is no distension.     Palpations: Abdomen is soft.     Tenderness: There is no abdominal tenderness.  Musculoskeletal:     Cervical back: Neck supple.     Right lower leg: No edema.     Left lower leg: No edema.     Comments:  Is able to move all extremities Has abnormal movements present.      Lymphadenopathy:     Cervical: No cervical adenopathy.  Skin:    General: Skin is warm and dry.  Neurological:     Mental Status: He is alert. Mental status is at baseline.  Psychiatric:        Mood and Affect: Mood normal.       ASSESSMENT/ PLAN:  TODAY  1. Metastatic  melanoma to liver 2. PSP (progressive supranuclear palsy) 3. Aortic atherosclerosis  4. Acquired hypothyroidism  Will begin synthroid 25 mcg daily  Will continue current medications Will continue current plan of care Will continue therapy as directed Will continue to monitor his status The goal of his care is uncertain at this time; more than likely he will be long term.   Time spent with patient and family 40 minutes: coordination of care; counseling: to discuss goals of care; options of placement; therapy needs; medications    Ok Edwards NP [facility]  Contact [phone] Monday through Friday 8am- 5pm  After hours call [phone]

## 2020-10-28 ENCOUNTER — Other Ambulatory Visit (HOSPITAL_COMMUNITY)
Admission: RE | Admit: 2020-10-28 | Discharge: 2020-10-28 | Disposition: A | Discharge status: Home / Self Care | Payer: Medicare PPO | Source: Skilled Nursing Facility | Attending: Adult Health | Admitting: Adult Health

## 2020-10-28 ENCOUNTER — Encounter: Payer: Self-pay | Admitting: Adult Health

## 2020-10-28 ENCOUNTER — Non-Acute Institutional Stay (SKILLED_NURSING_FACILITY): Payer: Medicare PPO | Admitting: Adult Health

## 2020-10-28 DIAGNOSIS — E871 Hypo-osmolality and hyponatremia: Secondary | ICD-10-CM | POA: Insufficient documentation

## 2020-10-28 DIAGNOSIS — E876 Hypokalemia: Secondary | ICD-10-CM

## 2020-10-28 LAB — BASIC METABOLIC PANEL
Anion gap: 10 (ref 5–15)
BUN: 15 mg/dL (ref 8–23)
CO2: 22 mmol/L (ref 22–32)
Calcium: 8.5 mg/dL — ABNORMAL LOW (ref 8.9–10.3)
Chloride: 99 mmol/L (ref 98–111)
Creatinine, Ser: 0.56 mg/dL — ABNORMAL LOW (ref 0.61–1.24)
GFR, Estimated: >60 mL/min (ref >60)
Glucose, Bld: 77 mg/dL (ref 70–99)
Potassium: 3.2 mmol/L — ABNORMAL LOW (ref 3.5–5.1)
Sodium: 131 mmol/L — ABNORMAL LOW (ref 135–145)

## 2020-10-28 NOTE — Progress Notes (Signed)
Location:  [place] Room Number: 133/P Place of Service:  SNF (31)   CODE STATUS: DNR  Allergies  Allergen Reactions  . Flomax [Tamsulosin Hcl]     Seizure     Chief Complaint  Patient presents with  . Follow-up    Follow Up Labs    HPI:  His hyponatremia is improved to 131; his k+ level is low at 3.2. there are no reports of uncontrolled pain; no changes in appetite; no insomnia or anxiety.   Past Medical History:  Diagnosis Date  . Chronic low back pain   . Gait abnormality 11/15/2019  . Hypertension   . PSP (progressive supranuclear palsy) ([place]) 11/15/2019  . Vitamin B12 deficiency     Past Surgical History:  Procedure Laterality Date  . CATARACT EXTRACTION, BILATERAL    . COLONOSCOPY    . COLONOSCOPY  02/04/2011   Procedure: COLONOSCOPY;  Surgeon: Daneil Dolin, MD;  Location[facility];  Service: Endoscopy;  Laterality: N/A;  . removal of malignant melanoma  2016    Social History   Socioeconomic History  . Marital status: Married    Spouse name: Not on file  . Number of children: 4  . Years of education: Not on file  . Highest education level: Not on file  Occupational History  . Occupation: retired  Tobacco Use  . Smoking status: Never Smoker  . Smokeless tobacco: Never Used  Vaping Use  . Vaping Use: Never used  Substance and Sexual Activity  . Alcohol use: Not Currently    Alcohol/week: 0.0 standard drinks  . Drug use: No  . Sexual activity: Not Currently  Other Topics Concern  . Not on file  Social History Narrative  . Not on file   Social Determinants of Health   Financial Resource Strain: Low Risk   . Difficulty of Paying Living Expenses: Not hard at all  Food Insecurity: No Food Insecurity  . Worried About Charity fundraiser in the Last Year: Never true  . Ran Out of Food in the Last Year: Never true  Transportation Needs: No Transportation Needs  . Lack of Transportation (Medical): No  . Lack of  Transportation (Non-Medical): No  Physical Activity: Inactive  . Days of Exercise per Week: 0 days  . Minutes of Exercise per Session: 0 min  Stress: No Stress Concern Present  . Feeling of Stress : Only a little  Social Connections: Socially Integrated  . Frequency of Communication with Friends and Family: More than three times a week  . Frequency of Social Gatherings with Friends and Family: More than three times a week  . Attends Religious Services: More than 4 times per year  . Active Member of Clubs or Organizations: Yes  . Attends Archivist Meetings: Not on file  . Marital Status: Married  Human resources officer Violence: Not At Risk  . Fear of Current or Ex-Partner: No  . Emotionally Abused: No  . Physically Abused: No  . Sexually Abused: No   Family History  Problem Relation Age of Onset  . Diabetes Mother   . Cancer Father   . Cancer - Lung Brother       VITAL SIGNS BP 106/60   Pulse 76   Temp 98.3 F (36.8 C)   Resp 20   Ht 5' 11.5" (1.816 m)   Wt 147 lb 12.8 oz (67 kg)   SpO2 96%   BMI 20.33 kg/m   Outpatient Encounter  Medications as of 10/28/2020  Medication Sig  . acetaminophen (TYLENOL) 325 MG tablet Take 2 tablets (650 mg total) by mouth every 6 (six) hours as needed for mild pain (or Fever >/= 101).  Marland Kitchen amLODipine (NORVASC) 10 MG tablet Take 1 tablet (10 mg total) by mouth daily. For BP  . Balsam [place]-Castor Oil (VENELEX) OINT Apply topically. Special Instructions: Apply Venelex to sacrum and buttocks Q shift and after each incontinence episode.  . bisacodyl (DULCOLAX) 10 MG suppository Place 1 suppository (10 mg total) rectally daily as needed for mild constipation or moderate constipation.  . carbidopa-levodopa (SINEMET IR) 25-100 MG tablet Take 1.5 tablets by mouth 3 (three) times daily.  . cholecalciferol (VITAMIN D) 1000 units tablet Take 1,000 Units by mouth in the morning.   . Cranberry 500 MG CAPS Take 1 capsule by mouth daily.  .  cyanocobalamin (,VITAMIN B-12,) 1000 MCG/ML injection Inject 1,000 mcg into the muscle every 30 (thirty) days.   . finasteride (PROSCAR) 5 MG tablet Take 1 tablet (5 mg total) by mouth daily.  . hydrALAZINE (APRESOLINE) 25 MG tablet Take 1.5 tablets (37.5 mg total) by mouth 2 (two) times daily.  Marland Kitchen linaclotide (LINZESS) 290 MCG CAPS capsule Take 290 mcg by mouth daily before breakfast.  . lisinopril (ZESTRIL) 20 MG tablet Take 20 mg by mouth daily.  . memantine (NAMENDA) 10 MG tablet Take 10 mg by mouth daily with lunch.  . NON FORMULARY Diet Regular  . NON FORMULARY Fluid restrictions 1500CC in 24 hours. Dietary 500cc Nursing provider 7a-3p 420cc and 3p-11p 420cc and 11p-7a 160cc . Every Shift Day, Evening, Night  . polyethylene glycol (MIRALAX / GLYCOLAX) 17 g packet Take 17 g by mouth daily.  . Polyvinyl Alcohol-Povidone PF 1.4-0.6 % SOLN Apply 1 drop to eye daily as needed (FOR DRY EYE RELIEF).   Marland Kitchen simethicone (MYLICON) 035 MG chewable tablet Chew 125 mg by mouth every 6 (six) hours as needed for flatulence.  . sodium chloride 1 g tablet Take 1 g by mouth 3 (three) times daily.  . vitamin C (ASCORBIC ACID) 500 MG tablet Take 500 mg by mouth daily.   No facility-administered encounter medications on file as of 10/28/2020.     SIGNIFICANT DIAGNOSTIC EXAMS   PREVIOUS   10-04-20: chest x-ray:  Bilateral layering pleural effusions. Bilateral lower lobe airspace opacities. Heart is normal size. Aortic atherosclerosis. No acute bony abnormality.  10-04-20: ct of abdomen and pelvis 1. Masslike process with marginal enhancement and central necrosis centered in the left hepatic lobe, measuring 14.2 by 10.3 by 9.9 cm (volume = 760 cm^3). Possibilities include malignancy or abscess. Correlate with 2. Linear lucency inferiorly in segment 3 of the liver could be from a dilated intrahepatic bile duct or possibly portal vein thrombosis of the associated portal vein tributary. 3. Large bilateral  pleural effusions with associated passive atelectasis. These effusions are nonspecific for transudative or exudative etiology. 4. Prominent stool throughout much of the colon, but not in the sigmoid colon or rectum. Borderline rectal wall thickening, without surrounding adenopathy. 5. Other imaging findings of potential clinical significance: Coronary atherosclerosis. Small pericardial effusion. Remote compression fractures at L2, L4, and L5. Lumbar spondylosis and degenerative disc disease causing multilevel impingement. Mild prostatomegaly. 6. Despite efforts by the technologist and patient, motion artifact is present on today's exam and could not be eliminated. This reduces exam sensitivity and specificity. 7. Aortic atherosclerosis.  10-04-20: ct of head:  1. No acute intracranial findings. 2. Periventricular white  matter and corona radiata hypodensities favor chronic ischemic microvascular white matter disease. 3. Mild chronic ethmoid sinusitis.  10-07-20: chest x-ray:  Improved resolved right-sided pleural effusion, without pneumothorax. Persistent left pleural effusion, adjacent atelectasis, and interstitial edema.  NO NEW EXAMS   LABS REVIEWED PREVIOUS    10-04-20: wbc 12.0; hgb 12.9; hct 38.8; mcv 96.0 plt 324; glucose 104; bun 18; creat 0.77; k+ 4.2; na++ 121 ca 8.5 GFR >60 alk phos 134; albumin 3.5  Urine culture no growth 10-05-20: vit B 12: 4163 10-06-20: wbc 13.9; hgb 12.3; hct 37.6; mcv 96.2 plt 356; glucose 89; bun 19; creat 0.74; k+ 4.3; na++ 127; ca 8.5 GFR>60 mag 2.0 10-09-20: wbc 11.0; hgb 11.7; hct 36.0; mcv 97.0 plt 342; glucose 88; bun 15; creat 0.72; k+ 3.8; na++ 128; ca 8.3 GFR>60 10-10-20: wbc 10.4; hgb 11.3; hct 35.0; mcv 96.7 plt 359; glucose 88; bun 15; creat 0.72 k+ 3.8; na++ 128; ca 8.3 GFR>60 10-16-20: glucose 92; bun 14; creat 0.51; k+ 4.3; na++ 127; ca 8.8 GFR>60; mag 2.0 10-21-20: wbc 12.7; hgb 14.7; hct 46.4; mcv 97.9 plt 366; glucose 91; bun 15; creat 0.56; k+  3.8; na++ 127; ca 8.7 GFR>60; alk phos 248; albumin 4.0; LDH 185; tsh 9.452  TODAY  10-28-20: glucose 77; bun 15; creat 0.56; k+ 3.2; na++ 131; ca 8.5 GFR>60    Review of Systems  Unable to perform ROS: Dementia (unable to participate )    Physical Exam Constitutional:      General: He is not in acute distress.    Appearance: He is well-developed. He is not diaphoretic.  Neck:     Thyroid: No thyromegaly.  Cardiovascular:     Rate and Rhythm: Normal rate and regular rhythm.     Pulses: Normal pulses.     Heart sounds: Normal heart sounds.  Pulmonary:     Effort: Pulmonary effort is normal. No respiratory distress.     Breath sounds: Normal breath sounds.  Abdominal:     General: Bowel sounds are normal. There is no distension.     Palpations: Abdomen is soft.     Tenderness: There is no abdominal tenderness.  Musculoskeletal:     Cervical back: Neck supple.     Right lower leg: No edema.     Left lower leg: No edema.     Comments: Is able to move all extremities Has abnormal movements present.    Lymphadenopathy:     Cervical: No cervical adenopathy.  Skin:    General: Skin is warm and dry.  Neurological:     Mental Status: He is alert. Mental status is at baseline.  Psychiatric:        Mood and Affect: Mood normal.       ASSESSMENT/ PLAN:   TODAY  Acute on chronic hyponatremia Hypokalemia  Sodium level has improved Will give k+ 40 meq today and will repeat level in the AM Will monitor   Ok Edwards NP [facility]  Contact [phone] Monday through Friday 8am- 5pm  After hours call [phone]

## 2020-10-29 ENCOUNTER — Other Ambulatory Visit (HOSPITAL_COMMUNITY)
Admission: RE | Admit: 2020-10-29 | Discharge: 2020-10-29 | Disposition: A | Discharge status: Home / Self Care | Payer: Medicare PPO | Source: Skilled Nursing Facility | Attending: Adult Health | Admitting: Adult Health

## 2020-10-29 DIAGNOSIS — E876 Hypokalemia: Secondary | ICD-10-CM | POA: Insufficient documentation

## 2020-10-29 LAB — POTASSIUM: Potassium: 3.9 mmol/L (ref 3.5–5.1)

## 2020-10-30 ENCOUNTER — Other Ambulatory Visit: Payer: Self-pay | Admitting: Adult Health

## 2020-10-30 MED ORDER — ALPRAZOLAM 0.5 MG PO TABS: 0.5000 mg | ORAL_TABLET | Freq: Once | ORAL | 0 refills | Status: AC

## 2020-11-01 ENCOUNTER — Ambulatory Visit (HOSPITAL_COMMUNITY)
Admit: 2020-11-01 | Discharge: 2020-11-01 | Disposition: A | Discharge status: Home / Self Care | Payer: Medicare PPO | Attending: Hematology | Admitting: Hematology

## 2020-11-01 ENCOUNTER — Ambulatory Visit (HOSPITAL_COMMUNITY)
Admission: RE | Admit: 2020-11-01 | Discharge: 2020-11-01 | Disposition: A | Discharge status: Home / Self Care | Payer: Medicare PPO | Source: Ambulatory Visit | Attending: Hematology | Admitting: Hematology

## 2020-11-01 DIAGNOSIS — C787 Secondary malignant neoplasm of liver and intrahepatic bile duct: Secondary | ICD-10-CM | POA: Diagnosis not present

## 2020-11-01 DIAGNOSIS — C439 Malignant melanoma of skin, unspecified: Secondary | ICD-10-CM | POA: Diagnosis not present

## 2020-11-01 DIAGNOSIS — J948 Other specified pleural conditions: Secondary | ICD-10-CM | POA: Diagnosis not present

## 2020-11-01 DIAGNOSIS — I700 Atherosclerosis of aorta: Secondary | ICD-10-CM | POA: Diagnosis not present

## 2020-11-01 DIAGNOSIS — G319 Degenerative disease of nervous system, unspecified: Secondary | ICD-10-CM | POA: Diagnosis not present

## 2020-11-01 DIAGNOSIS — I2510 Atherosclerotic heart disease of native coronary artery without angina pectoris: Secondary | ICD-10-CM | POA: Diagnosis not present

## 2020-11-01 MED ORDER — GADOBUTROL 1 MMOL/ML IV SOLN
7.0000 mL | Freq: Once | INTRAVENOUS | Status: AC | PRN
  Administered 2020-11-01: 7 mL via INTRAVENOUS

## 2020-11-01 MED ORDER — IOHEXOL 300 MG/ML  SOLN
75.0000 mL | Freq: Once | INTRAMUSCULAR | Status: AC | PRN
  Administered 2020-11-01: 75 mL via INTRAVENOUS

## 2020-11-04 DIAGNOSIS — E876 Hypokalemia: Secondary | ICD-10-CM | POA: Insufficient documentation

## 2020-11-05 ENCOUNTER — Inpatient Hospital Stay (HOSPITAL_COMMUNITY): Payer: Medicare PPO | Admitting: Hematology

## 2020-11-05 VITALS — BP 125/70 | HR 104 | Temp 96.8°F | Resp 18 | Wt 147.0 lb

## 2020-11-05 DIAGNOSIS — G231 Progressive supranuclear ophthalmoplegia [Steele-Richardson-Olszewski]: Secondary | ICD-10-CM | POA: Diagnosis not present

## 2020-11-05 DIAGNOSIS — E871 Hypo-osmolality and hyponatremia: Secondary | ICD-10-CM | POA: Diagnosis not present

## 2020-11-05 DIAGNOSIS — Z800 Family history of malignant neoplasm of digestive organs: Secondary | ICD-10-CM | POA: Diagnosis not present

## 2020-11-05 DIAGNOSIS — C787 Secondary malignant neoplasm of liver and intrahepatic bile duct: Secondary | ICD-10-CM

## 2020-11-05 DIAGNOSIS — J910 Malignant pleural effusion: Secondary | ICD-10-CM | POA: Diagnosis not present

## 2020-11-05 DIAGNOSIS — E538 Deficiency of other specified B group vitamins: Secondary | ICD-10-CM | POA: Diagnosis not present

## 2020-11-05 DIAGNOSIS — I10 Essential (primary) hypertension: Secondary | ICD-10-CM | POA: Diagnosis not present

## 2020-11-05 DIAGNOSIS — C434 Malignant melanoma of scalp and neck: Secondary | ICD-10-CM | POA: Diagnosis not present

## 2020-11-05 DIAGNOSIS — I2510 Atherosclerotic heart disease of native coronary artery without angina pectoris: Secondary | ICD-10-CM | POA: Diagnosis not present

## 2020-11-05 NOTE — Patient Instructions (Signed)
[place] at [facility] Discharge Instructions  You were seen today by Dr. Delton Coombes. He went over your recent results and scans; there is some fluid on the bottom of both lungs, more on the right side than the left, and there are no visible metastases in the brain. Dr. Mickie Bail will be contacted to consult on your neurological condition. Dr. Delton Coombes will see you back in 1 month for labs and follow up.   Thank you for choosing [place] at [facility] to provide your oncology and hematology care.  To afford each patient quality time with our provider, please arrive at least 15 minutes before your scheduled appointment time.   If you have a lab appointment with the [facility] please come in thru the Main Entrance and check in at the main information desk  You need to re-schedule your appointment should you arrive 10 or more minutes late.  We strive to give you quality time with our providers, and arriving late affects you and other patients whose appointments are after yours.  Also, if you no show three or more times for appointments you may be dismissed from the clinic at the providers discretion.     Again, thank you for choosing [facility].  Our hope is that these requests will decrease the amount of time that you wait before being seen by our physicians.       _____________________________________________________________  Should you have questions after your visit to [facility], please contact our office at [phone] between the hours of 8:00 a.m. and 4:30 p.m.  Voicemails left after 4:00 p.m. will not be returned until the following business day.  For prescription refill requests, have your pharmacy contact our office and allow 72 hours.    Cancer Center Support Programs:   > Cancer Support Group  2nd Tuesday of the month 1pm-2pm, Journey Room

## 2020-11-05 NOTE — Progress Notes (Signed)
[place], [place] [address]   CLINIC:  Medical Oncology/Hematology  PCP:  [name], MD [address] / [place] [address] [phone]   REASON FOR VISIT:  Follow-up for metastatic melanoma to liver  PRIOR THERAPY: Scalp resection in 2016  NGS Results: Not done  CURRENT THERAPY: Under work-up  BRIEF ONCOLOGIC HISTORY:  Oncology History   No history exists.    CANCER STAGING: Cancer Staging Metastatic melanoma to liver [facility]) Staging form: Melanoma of the Skin, AJCC 8th Edition - Clinical stage from 10/20/2020: Stage IV (cTX, cNX, pM1c) - Unsigned   INTERVAL HISTORY:  Luis Freeman, a 87 y.o. male, returns for routine follow-up of his metastatic melanoma to liver. Luis Freeman was last seen on 10/21/2020.   The patient is a poor historian and today is accompanied by his daughter. At [facility] he was going to therapy but was not responding as quickly and [employer] terminated his PT, but the family appealed and was granted permission to continue PT. His appetite is excellent though his daughter notes that he is burping and hiccupping at different times while eating. He continues getting a vitamin B12 injection at [facility].   REVIEW OF SYSTEMS:  Review of Systems  Constitutional: Positive for fatigue (25%). Negative for appetite change.  Neurological: Positive for speech difficulty (Non verbal).  All other systems reviewed and are negative.   PAST MEDICAL/SURGICAL HISTORY:  Past Medical History:  Diagnosis Date  . Chronic low back pain   . Gait abnormality 11/15/2019  . Hypertension   . PSP (progressive supranuclear palsy) ([place]) 11/15/2019  . Vitamin B12 deficiency    Past Surgical History:  Procedure Laterality Date  . CATARACT EXTRACTION, BILATERAL    . COLONOSCOPY    . COLONOSCOPY  02/04/2011   Procedure: COLONOSCOPY;  Surgeon: Daneil Dolin, MD;  Location: [facility];  Service: Endoscopy;  Laterality: N/A;   . removal of malignant melanoma  2016    SOCIAL HISTORY:  Social History   Socioeconomic History  . Marital status: Married    Spouse name: Not on file  . Number of children: 4  . Years of education: Not on file  . Highest education level: Not on file  Occupational History  . Occupation: retired  Tobacco Use  . Smoking status: Never Smoker  . Smokeless tobacco: Never Used  Vaping Use  . Vaping Use: Never used  Substance and Sexual Activity  . Alcohol use: Not Currently    Alcohol/week: 0.0 standard drinks  . Drug use: No  . Sexual activity: Not Currently  Other Topics Concern  . Not on file  Social History Narrative  . Not on file   Social Determinants of Health   Financial Resource Strain: Low Risk   . Difficulty of Paying Living Expenses: Not hard at all  Food Insecurity: No Food Insecurity  . Worried About Charity fundraiser in the Last Year: Never true  . Ran Out of Food in the Last Year: Never true  Transportation Needs: No Transportation Needs  . Lack of Transportation (Medical): No  . Lack of Transportation (Non-Medical): No  Physical Activity: Inactive  . Days of Exercise per Week: 0 days  . Minutes of Exercise per Session: 0 min  Stress: No Stress Concern Present  . Feeling of Stress : Only a little  Social Connections: Socially Integrated  . Frequency of Communication with Friends and Family: More than three times  a week  . Frequency of Social Gatherings with Friends and Family: More than three times a week  . Attends Religious Services: More than 4 times per year  . Active Member of Clubs or Organizations: Yes  . Attends Archivist Meetings: Not on file  . Marital Status: Married  Human resources officer Violence: Not At Risk  . Fear of Current or Ex-Partner: No  . Emotionally Abused: No  . Physically Abused: No  . Sexually Abused: No    FAMILY HISTORY:  Family History  Problem Relation Age of Onset  . Diabetes Mother   . Cancer Father    . Cancer - Lung Brother     CURRENT MEDICATIONS:  No current outpatient medications on file.   No current facility-administered medications for this visit.    ALLERGIES:  Allergies  Allergen Reactions  . Flomax [Tamsulosin Hcl]     Seizure     PHYSICAL EXAM:  Performance status (ECOG): 3 - Symptomatic, >50% confined to bed  Vitals:   11/05/20 1009 11/05/20 1034  BP: 125/70 125/70  Pulse: (!) 104 (!) 104  Resp: 18 18  Temp: (!) 96.7 F (35.9 C) (!) 96.8 F (36 C)  SpO2: 96% 96%   Wt Readings from Last 3 Encounters:  11/05/20 147 lb (66.7 kg)  10/28/20 147 lb 12.8 oz (67 kg)  10/24/20 147 lb 12.8 oz (67 kg)   Physical Exam Vitals reviewed.  Constitutional:      Comments: In wheelchair  Abdominal:     General: There is no distension.     Palpations: Abdomen is soft.     Tenderness: There is no abdominal tenderness.  Neurological:     Mental Status: He is lethargic.  Psychiatric:        Attention and Perception: He is inattentive.      LABORATORY DATA:  I have reviewed the labs as listed.  CBC Latest Ref Rng & Units 10/21/2020 10/10/2020 10/09/2020  WBC 4.0 - 10.5 K/uL 12.7(H) 10.4 11.0(H)  Hemoglobin 13.0 - 17.0 g/dL 14.7 11.3(L) 11.7(L)  Hematocrit 39.0 - 52.0 % 46.4 35.0(L) 36.0(L)  Platelets 150 - 400 K/uL 366 359 342   CMP Latest Ref Rng & Units 10/29/2020 10/28/2020 10/21/2020  Glucose 70 - 99 mg/dL - 77 119(H)  BUN 8 - 23 mg/dL - 15 17  Creatinine 0.61 - 1.24 mg/dL - 0.56(L) 0.67  Sodium 135 - 145 mmol/L - 131(L) 126(L)  Potassium 3.5 - 5.1 mmol/L 3.9 3.2(L) 4.0  Chloride 98 - 111 mmol/L - 99 94(L)  CO2 22 - 32 mmol/L - 22 22  Calcium 8.9 - 10.3 mg/dL - 8.5(L) 9.4  Total Protein 6.5 - 8.1 g/dL - - 8.2(H)  Total Bilirubin 0.3 - 1.2 mg/dL - - 0.8  Alkaline Phos 38 - 126 U/L - - 248(H)  AST 15 - 41 U/L - - 31  ALT 0 - 44 U/L - - 12    DIAGNOSTIC IMAGING:  I have independently reviewed the scans and discussed with the patient. DG Chest 1  View  Result Date: 10/07/2020 CLINICAL DATA:  Status post right-sided thoracentesis EXAM: CHEST  1 VIEW COMPARISON:  1 day prior earlier today at 5:02 a.m. FINDINGS: Midline trachea. Mild cardiomegaly. Layering small left pleural effusion is unchanged. Significant improvement to resolution in right-sided pleural fluid. Skin fold over the chest bilaterally, but no pneumothorax. Underlying mild interstitial edema. Left base airspace disease persists. IMPRESSION: Improved resolved right-sided pleural effusion, without pneumothorax. Persistent left pleural  effusion, adjacent atelectasis, and interstitial edema. Electronically Signed   By: Abigail Miyamoto M.D.   On: 10/07/2020 10:20   CT Chest W Contrast  Result Date: 11/03/2020 CLINICAL DATA:  Metastatic melanoma, metastatic to liver EXAM: CT CHEST WITH CONTRAST TECHNIQUE: Multidetector CT imaging of the chest was performed during intravenous contrast administration. CONTRAST:  53m OMNIPAQUE IOHEXOL 300 MG/ML  SOLN COMPARISON:  CT abdomen pelvis, 10/04/2020 FINDINGS: Cardiovascular: Aortic atherosclerosis. Normal heart size. Three-vessel coronary artery calcifications. No pericardial effusion. Mediastinum/Nodes: No enlarged mediastinal, hilar, or axillary lymph nodes. Thyroid gland, trachea, and esophagus demonstrate no significant findings. Lungs/Pleura: Moderate, bilateral right greater than left pleural effusions and associated atelectasis or consolidation. Visceral pleural calcification of the posterior right pulmonary apex (series 2, image 28). Upper Abdomen: Trace perihepatic and perisplenic ascites. Redemonstrated, large lobulated mass of the anterior left lobe of the liver, better seen on prior dedicated imaging of the abdomen and pelvis. Musculoskeletal: No chest wall mass or suspicious bone lesions identified. IMPRESSION: 1. Moderate, bilateral right greater than left pleural effusions and associated atelectasis or consolidation, similar to prior CT of the  abdomen and pelvis. These are suspicious for malignant effusions in the setting of known metastatic disease, however there is no direct evidence of pleural nodularity or thickening. 2. Trace perihepatic and perisplenic ascites. 3. Redemonstrated, large lobulated mass of the anterior left lobe of the liver, better seen on prior dedicated imaging of the abdomen and pelvis. 4. Coronary artery disease. Aortic Atherosclerosis (ICD10-I70.0). Electronically Signed   By: AEddie CandleM.D.   On: 11/03/2020 12:03   MR Brain W Wo Contrast  Result Date: 11/01/2020 CLINICAL DATA:  Metastatic melanoma to the liver. Intracranial staging EXAM: MRI HEAD WITHOUT AND WITH CONTRAST TECHNIQUE: Multiplanar, multiecho pulse sequences of the brain and surrounding structures were obtained without and with intravenous contrast. CONTRAST:  775mGADAVIST GADOBUTROL 1 MMOL/ML IV SOLN COMPARISON:  Head CT 10/04/2020 FINDINGS: Brain: No enhancement or swelling to suggest metastatic disease. No incidental infarct, hydrocephalus, or hematoma. Generalized atrophy. Periventricular chronic small vessel ischemia. Vascular: Preserved flow voids and vascular enhancements. Skull and upper cervical spine: Scalp defect in the posterior left parietal region without underlying marrow signal abnormality or nodular soft tissue component. Sinuses/Orbits: Negative IMPRESSION: No evidence of metastatic disease. Electronically Signed   By: JoMonte Fantasia.D.   On: 11/01/2020 22:17   US[place]oreaIOPSY (LIVER)  Result Date: 10/08/2020 INDICATION: Indeterminate anterior large left liver mass EXAM: ULTRASOUND GUIDED CORE BIOPSY OF ANTERIOR LEFT LIVER MASS MEDICATIONS: 1% LIDOCAINE LOCAL ANESTHESIA/SEDATION: Moderate Sedation Time:  None. The patient was continuously monitored during the procedure by the interventional radiology nurse under my direct supervision. FLUOROSCOPY TIME:  Fluoroscopy Time: None. COMPLICATIONS: None immediate. PROCEDURE: The procedure, risks,  benefits, and alternatives were explained to the patient. Questions regarding the procedure were encouraged and answered. The patient understands and consents to the procedure. Previous imaging reviewed. Preliminary ultrasound performed. The large anterior left hepatic mass was localized from a subcostal anterior approach. Under sterile conditions and local anesthesia, a 17 gauge 6.8 cm access was advanced into the lesion. Needle position confirmed with ultrasound. 18 gauge core biopsies obtained. These were intact and non fragmented. Samples placed in formalin. Needle tract occluded with Gel-Foam. Postprocedure imaging demonstrates no hemorrhage or hematoma. Patient tolerated biopsy well. FINDINGS: Imaging confirms needle placement in the anterior large left liver lesion for core biopsy IMPRESSION: Successful ultrasound anterior left hepatic mass 18 gauge core biopsy Electronically Signed   By: M.Jerilynn Mages  Shick M.D.   On: 10/08/2020 11:44   DG CHEST PORT 1 VIEW  Result Date: 10/07/2020 CLINICAL DATA:  Pleural effusion EXAM: PORTABLE CHEST 1 VIEW COMPARISON:  October 04, 2020 FINDINGS: Pleural effusions again noted bilaterally, layering. There is persistent airspace opacity in the medial left base. Bilateral apical pleural thickening is stable. No new opacity. Heart is upper normal in size with pulmonary vascularity normal. No adenopathy. There is aortic atherosclerosis. Bones are osteoporotic. IMPRESSION: Layering pleural effusions bilaterally. Airspace opacity left lower lobe which may represent atelectasis with potential underlying pneumonia. No new opacity. Stable cardiac silhouette. Aortic Atherosclerosis (ICD10-I70.0). Electronically Signed   By: Lowella Grip III M.D.   On: 10/07/2020 07:59   DG BE (COLON)W SINGLE CM (SOL OR THIN BA)  Result Date: 10/07/2020 CLINICAL DATA:  Constipation with lack of bowel movements for 8 days. Hepatic mass on CT. EXAM: WATER SOLUBLE CONTRAST ENEMA TECHNIQUE:  Single-contrast non prepped barium enema was performed for both therapeutic and attempted diagnostic purposes. FLUOROSCOPY TIME:  Fluoroscopy Time:  3 minutes and 30 seconds Radiation Exposure Index (if provided by the fluoroscopic device): 4 Number of Acquired Spot Images: Not applicable. COMPARISON:  Abdominopelvic CT of 10/04/2020. FINDINGS: Preprocedure scout film demonstrates a large amount of colonic stool. No bowel obstruction. Focused, limited single-contrast exam performed with the patient in a left lateral and supine position. Limitations secondary to patient's age and clinical status. The rectum and sigmoid are grossly normal, without circumferential narrowing. From the sigmoid proximally, the colon is stool filled. Contrast filled to approximately the level of the mid transverse colon. IMPRESSION: 1. Focused therapeutic and less so diagnostic non prepped barium enema performed. 2. No evidence of circumferential or obstructive mass, especially within the rectum or sigmoid. Electronically Signed   By: Abigail Miyamoto M.D.   On: 10/07/2020 11:46   [place] EKG SITE RITE  Result Date: 10/14/2020 If Site Rite image not attached, placement could not be confirmed due to current cardiac rhythm.  US THORACENTESIS ASP PLEURAL SPACE W/IMG GUIDE  Result Date: 10/07/2020 INDICATION: Bilateral pleural fluid. EXAM: ULTRASOUND GUIDED RIGHT filled THORACENTESIS MEDICATIONS: None. COMPLICATIONS: None immediate. PROCEDURE: An ultrasound guided thoracentesis was thoroughly discussed with the patient and the patient's daughter and questions answered. The benefits, risks, alternatives and complications were also discussed. The patient understands and wishes to proceed with the procedure. Written consent was obtained. Ultrasound was performed to localize and mark an adequate pocket of fluid in the right chest. The area was then prepped and draped in the normal sterile fashion. 1% Lidocaine was used for local anesthesia. Under  ultrasound guidance a 19 gauge, 7-cm, Yueh catheter was introduced. Thoracentesis was performed. The catheter was removed and a dressing applied. FINDINGS: A total of approximately 1.2 of yellow fluid was removed. Samples were sent to the laboratory as requested by the clinical team. IMPRESSION: Successful ultrasound guided right thoracentesis yielding 1.2 L of pleural fluid. Electronically Signed   By: Abigail Miyamoto M.D.   On: 10/07/2020 10:22     ASSESSMENT:  1.  Metastatic melanoma to liver: -Melanoma of the scalp resected in 2016 at [facility] in [place]. -Recent hospitalization from 10/04/2020 through 10/10/2020 with severe constipation which relieved with Gastrografin enema. -CTAP on 10/04/2020 during recent hospitalization showed liver mass in the left hepatic lobe measuring 14.2 x 10.3 x 9.9 cm.  Large bilateral pleural effusions. -US guided biopsy on 10/08/2020 of the liver lesion consistent with metastatic melanoma.  2.  Progressive supranuclear palsy: -He  follows with Dr. Mickie Bail at [facility]. -Changes in speech were noted in 2018 and progressive trouble with fine motor skills, walking and balance.  He had a significant functional decline.  Prior to recent hospitalization he had a person watch him 24/7 since February 20, 2020.  Prior to hospitalization he was walking with help of walker. -He is on carbidopa-levodopa 3 times a day.  He is also taking memantine.  3.  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  Non-smoker. -Father died of stomach cancer in his early 102s.  Brother and sister had lung cancer and both were smokers.   PLAN:  1.  Metastatic melanoma to liver: -We have reviewed results of MRI of the brain which was negative for metastatic disease. - CT of the chest showed bilateral pleural effusions, right more than left, most likely malignant. - His functional status has not improved much.  They are working on getting  him back on physical therapy again. - I have talked to his neurologist Dr. Mickie Bail at [facility].  The usual survival with progressive supranuclear palsy is up to 3-5 years from diagnosis. - I do not believe his functional status is good enough to try any immunotherapy at this time. - BRAF mutation status is pending.  We also talked about treatment for melanoma with immunotherapy and its side effects in detail. - We will reevaluate him in a month with labs.  2.  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  Serum osmolality was 278, urine osmolality 512, urine sodium of 50.   Orders placed this encounter:  Orders Placed This Encounter  Procedures  . CBC with Differential  . Comprehensive metabolic panel  . Lactate dehydrogenase     Derek Jack, MD [place] [phone]   I, Milinda Antis, am acting as a scribe for Dr. Sanda Linger.  I, Derek Jack MD, have reviewed the above documentation for accuracy and completeness, and I agree with the above.

## 2020-11-11 DIAGNOSIS — Z515 Encounter for palliative care: Secondary | ICD-10-CM | POA: Diagnosis not present

## 2020-11-11 DIAGNOSIS — C434 Malignant melanoma of scalp and neck: Secondary | ICD-10-CM | POA: Diagnosis not present

## 2020-11-11 DIAGNOSIS — R279 Unspecified lack of coordination: Secondary | ICD-10-CM | POA: Diagnosis not present

## 2020-11-11 DIAGNOSIS — G231 Progressive supranuclear ophthalmoplegia [Steele-Richardson-Olszewski]: Secondary | ICD-10-CM | POA: Diagnosis not present

## 2020-11-11 DIAGNOSIS — E871 Hypo-osmolality and hyponatremia: Secondary | ICD-10-CM | POA: Diagnosis not present

## 2020-11-11 DIAGNOSIS — C787 Secondary malignant neoplasm of liver and intrahepatic bile duct: Secondary | ICD-10-CM | POA: Diagnosis not present

## 2020-11-11 DIAGNOSIS — M6281 Muscle weakness (generalized): Secondary | ICD-10-CM | POA: Diagnosis not present

## 2020-11-12 DIAGNOSIS — M6281 Muscle weakness (generalized): Secondary | ICD-10-CM | POA: Diagnosis not present

## 2020-11-12 DIAGNOSIS — E871 Hypo-osmolality and hyponatremia: Secondary | ICD-10-CM | POA: Diagnosis not present

## 2020-11-12 DIAGNOSIS — R279 Unspecified lack of coordination: Secondary | ICD-10-CM | POA: Diagnosis not present

## 2020-11-12 DIAGNOSIS — G231 Progressive supranuclear ophthalmoplegia [Steele-Richardson-Olszewski]: Secondary | ICD-10-CM | POA: Diagnosis not present

## 2020-11-13 ENCOUNTER — Non-Acute Institutional Stay (SKILLED_NURSING_FACILITY): Payer: Medicare PPO | Admitting: Adult Health

## 2020-11-13 ENCOUNTER — Encounter: Payer: Self-pay | Admitting: Adult Health

## 2020-11-13 DIAGNOSIS — F0150 Vascular dementia without behavioral disturbance: Secondary | ICD-10-CM

## 2020-11-13 DIAGNOSIS — M6281 Muscle weakness (generalized): Secondary | ICD-10-CM | POA: Diagnosis not present

## 2020-11-13 DIAGNOSIS — E871 Hypo-osmolality and hyponatremia: Secondary | ICD-10-CM

## 2020-11-13 DIAGNOSIS — G231 Progressive supranuclear ophthalmoplegia [Steele-Richardson-Olszewski]: Secondary | ICD-10-CM | POA: Diagnosis not present

## 2020-11-13 DIAGNOSIS — R279 Unspecified lack of coordination: Secondary | ICD-10-CM | POA: Diagnosis not present

## 2020-11-13 NOTE — Progress Notes (Signed)
Location:  [place] Room Number: 133/P Place of Service:  SNF (31)   CODE STATUS: DNR  Allergies  Allergen Reactions  . Flomax [Tamsulosin Hcl]     Seizure     Chief Complaint  Patient presents with  . Medical Management of Chronic Issues          PSP (progressive supranuclear palsy)    Chronic hyponatremia:  Vascular dementia without behavioral disturbance:     HPI:  He is a 87 year old long term resident of this facility being seen for the management of his chronic illnesses:PSP (progressive supranuclear palsy)    Chronic hyponatremia:  Vascular dementia without behavioral disturbance. At this time he is non communicative. There are no reports of uncontrolled pain; no changes in appetite; he last lost about 4 pounds.     Past Medical History:  Diagnosis Date  . Chronic low back pain   . Gait abnormality 11/15/2019  . Hypertension   . PSP (progressive supranuclear palsy) ( [place]) 11/15/2019  . Vitamin B12 deficiency     Past Surgical History:  Procedure Laterality Date  . CATARACT EXTRACTION, BILATERAL    . COLONOSCOPY    . COLONOSCOPY  02/04/2011   Procedure: COLONOSCOPY;  Surgeon: Daneil Dolin, MD;  [facility];  Service: Endoscopy;  Laterality: N/A;  . removal of malignant melanoma  2016    Social History   Socioeconomic History  . Marital status: Married    Spouse name: Not on file  . Number of children: 4  . Years of education: Not on file  . Highest education level: Not on file  Occupational History  . Occupation: retired  Tobacco Use  . Smoking status: Never Smoker  . Smokeless tobacco: Never Used  Vaping Use  . Vaping Use: Never used  Substance and Sexual Activity  . Alcohol use: Not Currently    Alcohol/week: 0.0 standard drinks  . Drug use: No  . Sexual activity: Not Currently  Other Topics Concern  . Not on file  Social History Narrative  . Not on file   Social Determinants of Health   Financial  Resource Strain: Low Risk   . Difficulty of Paying Living Expenses: Not hard at all  Food Insecurity: No Food Insecurity  . Worried About Charity fundraiser in the Last Year: Never true  . Ran Out of Food in the Last Year: Never true  Transportation Needs: No Transportation Needs  . Lack of Transportation (Medical): No  . Lack of Transportation (Non-Medical): No  Physical Activity: Inactive  . Days of Exercise per Week: 0 days  . Minutes of Exercise per Session: 0 min  Stress: No Stress Concern Present  . Feeling of Stress : Only a little  Social Connections: Socially Integrated  . Frequency of Communication with Friends and Family: More than three times a week  . Frequency of Social Gatherings with Friends and Family: More than three times a week  . Attends Religious Services: More than 4 times per year  . Active Member of Clubs or Organizations: Yes  . Attends Archivist Meetings: Not on file  . Marital Status: Married  Human resources officer Violence: Not At Risk  . Fear of Current or Ex-Partner: No  . Emotionally Abused: No  . Physically Abused: No  . Sexually Abused: No   Family History  Problem Relation Age of Onset  . Diabetes Mother   . Cancer Father   . Cancer -  Lung Brother       VITAL SIGNS BP 106/60   Pulse 76   Temp 98.2 F (36.8 C)   Resp 20   Ht 5' 11.5" (1.816 m)   Wt 147 lb (66.7 kg)   SpO2 96%   BMI 20.22 kg/m   Outpatient Encounter Medications as of 11/13/2020  Medication Sig  . acetaminophen (TYLENOL) 325 MG tablet Take 2 tablets (650 mg total) by mouth every 6 (six) hours as needed for mild pain (or Fever >/= 101).  Marland Kitchen amLODipine (NORVASC) 10 MG tablet Take 1 tablet (10 mg total) by mouth daily. For BP  . Balsam [place]-Castor Oil (VENELEX) OINT Apply topically. Special Instructions: Apply Venelex to sacrum and buttocks Q shift and after each incontinence episode.  . bisacodyl (DULCOLAX) 10 MG suppository Place 1 suppository (10 mg total)  rectally daily as needed for mild constipation or moderate constipation.  . carbidopa-levodopa (SINEMET IR) 25-100 MG tablet Take 1.5 tablets by mouth 3 (three) times daily.  . cholecalciferol (VITAMIN D) 1000 units tablet Take 1,000 Units by mouth in the morning.   . Cranberry 500 MG CAPS Take 1 capsule by mouth daily.  . cyanocobalamin (,VITAMIN B-12,) 1000 MCG/ML injection Inject 1,000 mcg into the muscle every 30 (thirty) days.   . finasteride (PROSCAR) 5 MG tablet Take 1 tablet (5 mg total) by mouth daily.  . hydrALAZINE (APRESOLINE) 25 MG tablet Take 1.5 tablets (37.5 mg total) by mouth 2 (two) times daily.  Marland Kitchen linaclotide (LINZESS) 290 MCG CAPS capsule Take 290 mcg by mouth daily before breakfast.  . lisinopril (ZESTRIL) 20 MG tablet Take 20 mg by mouth daily.  . memantine (NAMENDA) 10 MG tablet Take 10 mg by mouth daily with lunch.  . NON FORMULARY Diet Regular  . NON FORMULARY Fluid restrictions 1500CC in 24 hours. Dietary 500cc Nursing provider 7a-3p 420cc and 3p-11p 420cc and 11p-7a 160cc . Every Shift Day, Evening, Night  . polyethylene glycol (MIRALAX / GLYCOLAX) 17 g packet Take 17 g by mouth daily.  . Polyvinyl Alcohol-Povidone PF 1.4-0.6 % SOLN Apply 1 drop to eye daily as needed (FOR DRY EYE RELIEF).   Marland Kitchen simethicone (MYLICON) 409 MG chewable tablet Chew 125 mg by mouth every 6 (six) hours as needed for flatulence.  . sodium chloride 1 g tablet Take 1 g by mouth 3 (three) times daily.  . vitamin C (ASCORBIC ACID) 500 MG tablet Take 500 mg by mouth daily.   No facility-administered encounter medications on file as of 11/13/2020.     SIGNIFICANT DIAGNOSTIC EXAMS  PREVIOUS   10-04-20: chest x-ray:  Bilateral layering pleural effusions. Bilateral lower lobe airspace opacities. Heart is normal size. Aortic atherosclerosis. No acute bony abnormality.  10-04-20: ct of abdomen and pelvis 1. Masslike process with marginal enhancement and central necrosis centered in the left  hepatic lobe, measuring 14.2 by 10.3 by 9.9 cm (volume = 760 cm^3). Possibilities include malignancy or abscess. Correlate with 2. Linear lucency inferiorly in segment 3 of the liver could be from a dilated intrahepatic bile duct or possibly portal vein thrombosis of the associated portal vein tributary. 3. Large bilateral pleural effusions with associated passive atelectasis. These effusions are nonspecific for transudative or exudative etiology. 4. Prominent stool throughout much of the colon, but not in the sigmoid colon or rectum. Borderline rectal wall thickening, without surrounding adenopathy. 5. Other imaging findings of potential clinical significance: Coronary atherosclerosis. Small pericardial effusion. Remote compression fractures at L2, L4, and L5. Lumbar  spondylosis and degenerative disc disease causing multilevel impingement. Mild prostatomegaly. 6. Despite efforts by the technologist and patient, motion artifact is present on today's exam and could not be eliminated. This reduces exam sensitivity and specificity. 7. Aortic atherosclerosis.  10-04-20: ct of head:  1. No acute intracranial findings. 2. Periventricular white matter and corona radiata hypodensities favor chronic ischemic microvascular white matter disease. 3. Mild chronic ethmoid sinusitis.  10-07-20: chest x-ray:  Improved resolved right-sided pleural effusion, without pneumothorax. Persistent left pleural effusion, adjacent atelectasis, and interstitial edema.  NO NEW EXAMS   LABS REVIEWED PREVIOUS    10-04-20: wbc 12.0; hgb 12.9; hct 38.8; mcv 96.0 plt 324; glucose 104; bun 18; creat 0.77; k+ 4.2; na++ 121 ca 8.5 GFR >60 alk phos 134; albumin 3.5  Urine culture no growth 10-05-20: vit B 12: 4163 10-06-20: wbc 13.9; hgb 12.3; hct 37.6; mcv 96.2 plt 356; glucose 89; bun 19; creat 0.74; k+ 4.3; na++ 127; ca 8.5 GFR>60 mag 2.0 10-09-20: wbc 11.0; hgb 11.7; hct 36.0; mcv 97.0 plt 342; glucose 88; bun 15; creat 0.72; k+  3.8; na++ 128; ca 8.3 GFR>60 10-10-20: wbc 10.4; hgb 11.3; hct 35.0; mcv 96.7 plt 359; glucose 88; bun 15; creat 0.72 k+ 3.8; na++ 128; ca 8.3 GFR>60 10-16-20: glucose 92; bun 14; creat 0.51; k+ 4.3; na++ 127; ca 8.8 GFR>60; mag 2.0 10-21-20: wbc 12.7; hgb 14.7; hct 46.4; mcv 97.9 plt 366; glucose 91; bun 15; creat 0.56; k+ 3.8; na++ 127; ca 8.7 GFR>60; alk phos 248; albumin 4.0; LDH 185; tsh 9.452 10-28-20: glucose 77; bun 15; creat 0.56; k+ 3.2; na++ 131; ca 8.5 GFR>60  TODAY  10-29-20: k+ 3.9     Review of Systems  Unable to perform ROS: Dementia (unable to participate )    Physical Exam Constitutional:      General: He is not in acute distress.    Appearance: He is well-developed. He is not diaphoretic.  Neck:     Thyroid: No thyromegaly.  Cardiovascular:     Rate and Rhythm: Normal rate and regular rhythm.     Pulses: Normal pulses.     Heart sounds: Normal heart sounds.  Pulmonary:     Effort: Pulmonary effort is normal. No respiratory distress.     Breath sounds: Normal breath sounds.  Abdominal:     General: Bowel sounds are normal. There is no distension.     Palpations: Abdomen is soft.     Tenderness: There is no abdominal tenderness.  Musculoskeletal:     Cervical back: Neck supple.     Right lower leg: No edema.     Left lower leg: No edema.     Comments: Is able to move all extremities Has abnormal movements present  Lymphadenopathy:     Cervical: No cervical adenopathy.  Skin:    General: Skin is warm and dry.  Neurological:     Mental Status: He is alert. Mental status is at baseline.  Psychiatric:        Mood and Affect: Mood normal.      ASSESSMENT/ PLAN:  TODAY  1. PSP (progressive supranuclear palsy) is without change in status; will continue sinemet 25/100 mg 1.5 tabs three times daily   2. Chronic hyponatremia: is stable na++ 131; will continue NACL 1 gm three times daily   3. Vascular dementia without behavioral disturbance: is without change  weight is 147 pounds will continue namenda 10 mg twice daily   PREVIOUS   4. Aortic atherosclerosis: (  cxr 10-04-20) will monitor   5. Liver lesion: 14.2 x 10.3 x 9.9cm per scan is status post biopsy; metastatic melanoma;   6. Vitamin B 12 deficiency: level 4163 will continue monthly injections at this time  7. chronic urine retention: at this time foley is out; will continue proscar 5 mg daily   8. Constipation: is at this time stable abdomen is slightly distended will continue linzess 290 mcg daily and has prn dulcolax suppository  9. Essential hypertension: is stable b/p 1: 106/60 will continue norvasc 10 mg daily lotensin 20 mg daily hydralazine 37.5 mg twice daily     Ok Edwards NP [facility]  Contact [phone] Monday through Friday 8am- 5pm  After hours call [phone]

## 2020-11-14 DIAGNOSIS — M6281 Muscle weakness (generalized): Secondary | ICD-10-CM | POA: Diagnosis not present

## 2020-11-14 DIAGNOSIS — R279 Unspecified lack of coordination: Secondary | ICD-10-CM | POA: Diagnosis not present

## 2020-11-14 DIAGNOSIS — E871 Hypo-osmolality and hyponatremia: Secondary | ICD-10-CM | POA: Diagnosis not present

## 2020-11-14 DIAGNOSIS — G231 Progressive supranuclear ophthalmoplegia [Steele-Richardson-Olszewski]: Secondary | ICD-10-CM | POA: Diagnosis not present

## 2020-11-15 ENCOUNTER — Non-Acute Institutional Stay (SKILLED_NURSING_FACILITY): Payer: Medicare PPO | Admitting: Adult Health

## 2020-11-15 ENCOUNTER — Encounter: Payer: Self-pay | Admitting: Adult Health

## 2020-11-15 DIAGNOSIS — E871 Hypo-osmolality and hyponatremia: Secondary | ICD-10-CM | POA: Diagnosis not present

## 2020-11-15 DIAGNOSIS — R279 Unspecified lack of coordination: Secondary | ICD-10-CM | POA: Diagnosis not present

## 2020-11-15 DIAGNOSIS — G4730 Sleep apnea, unspecified: Secondary | ICD-10-CM | POA: Diagnosis not present

## 2020-11-15 DIAGNOSIS — G231 Progressive supranuclear ophthalmoplegia [Steele-Richardson-Olszewski]: Secondary | ICD-10-CM | POA: Diagnosis not present

## 2020-11-15 DIAGNOSIS — M6281 Muscle weakness (generalized): Secondary | ICD-10-CM | POA: Diagnosis not present

## 2020-11-15 NOTE — Progress Notes (Signed)
Location:  [place] Room Number: 133/P Place of Service:  SNF (31)   CODE STATUS: DNR  Allergies  Allergen Reactions  . Flomax [Tamsulosin Hcl]     Seizure     Chief Complaint  Patient presents with  . Acute Visit    Sleep Apnea     HPI:  His family is concerned about his sleep apnea. They have noted this over the past several weeks. They do understand that he is not appropriate for a sleep study and for use of cpap machine. There are no reports of shortness of breath. His 02 sats have declined into the upper 80's. They are interested in having 02 at night to improve his 02 sats.   Past Medical History:  Diagnosis Date  . Chronic low back pain   . Gait abnormality 11/15/2019  . Hypertension   . PSP (progressive supranuclear palsy) ([place]) 11/15/2019  . Vitamin B12 deficiency     Past Surgical History:  Procedure Laterality Date  . CATARACT EXTRACTION, BILATERAL    . COLONOSCOPY    . COLONOSCOPY  02/04/2011   Procedure: COLONOSCOPY;  Surgeon: Daneil Dolin, MD;  [facility];  Service: Endoscopy;  Laterality: N/A;  . removal of malignant melanoma  2016    Social History   Socioeconomic History  . Marital status: Married    Spouse name: Not on file  . Number of children: 4  . Years of education: Not on file  . Highest education level: Not on file  Occupational History  . Occupation: retired  Tobacco Use  . Smoking status: Never Smoker  . Smokeless tobacco: Never Used  Vaping Use  . Vaping Use: Never used  Substance and Sexual Activity  . Alcohol use: Not Currently    Alcohol/week: 0.0 standard drinks  . Drug use: No  . Sexual activity: Not Currently  Other Topics Concern  . Not on file  Social History Narrative  . Not on file   Social Determinants of Health   Financial Resource Strain: Low Risk   . Difficulty of Paying Living Expenses: Not hard at all  Food Insecurity: No Food Insecurity  . Worried About Ship broker in the Last Year: Never true  . Ran Out of Food in the Last Year: Never true  Transportation Needs: No Transportation Needs  . Lack of Transportation (Medical): No  . Lack of Transportation (Non-Medical): No  Physical Activity: Inactive  . Days of Exercise per Week: 0 days  . Minutes of Exercise per Session: 0 min  Stress: No Stress Concern Present  . Feeling of Stress : Only a little  Social Connections: Socially Integrated  . Frequency of Communication with Friends and Family: More than three times a week  . Frequency of Social Gatherings with Friends and Family: More than three times a week  . Attends Religious Services: More than 4 times per year  . Active Member of Clubs or Organizations: Yes  . Attends Archivist Meetings: Not on file  . Marital Status: Married  Human resources officer Violence: Not At Risk  . Fear of Current or Ex-Partner: No  . Emotionally Abused: No  . Physically Abused: No  . Sexually Abused: No   Family History  Problem Relation Age of Onset  . Diabetes Mother   . Cancer Father   . Cancer - Lung Brother       VITAL SIGNS BP 106/60   Pulse 76  Temp 98.2 F (36.8 C)   Resp 20   Ht 5' 11.5" (1.816 m)   Wt 147 lb (66.7 kg)   SpO2 96%   BMI 20.22 kg/m   Outpatient Encounter Medications as of 11/15/2020  Medication Sig  . acetaminophen (TYLENOL) 325 MG tablet Take 2 tablets (650 mg total) by mouth every 6 (six) hours as needed for mild pain (or Fever >/= 101).  Marland Kitchen amLODipine (NORVASC) 10 MG tablet Take 1 tablet (10 mg total) by mouth daily. For BP  . Balsam [place]-Castor Oil (VENELEX) OINT Apply topically. Special Instructions: Apply Venelex to sacrum and buttocks Q shift and after each incontinence episode.  . bisacodyl (DULCOLAX) 10 MG suppository Place 1 suppository (10 mg total) rectally daily as needed for mild constipation or moderate constipation.  . carbidopa-levodopa (SINEMET IR) 25-100 MG tablet Take 1.5 tablets by mouth 3  (three) times daily.  . cholecalciferol (VITAMIN D) 1000 units tablet Take 1,000 Units by mouth in the morning.   . Cranberry 500 MG CAPS Take 1 capsule by mouth daily.  . cyanocobalamin (,VITAMIN B-12,) 1000 MCG/ML injection Inject 1,000 mcg into the muscle every 30 (thirty) days.   . finasteride (PROSCAR) 5 MG tablet Take 1 tablet (5 mg total) by mouth daily.  . hydrALAZINE (APRESOLINE) 25 MG tablet Take 1.5 tablets (37.5 mg total) by mouth 2 (two) times daily.  Marland Kitchen linaclotide (LINZESS) 290 MCG CAPS capsule Take 290 mcg by mouth daily before breakfast.  . lisinopril (ZESTRIL) 20 MG tablet Take 20 mg by mouth daily.  . memantine (NAMENDA) 10 MG tablet Take 10 mg by mouth daily with lunch.  . NON FORMULARY Diet Regular  . NON FORMULARY Fluid restrictions 1500CC in 24 hours. Dietary 500cc Nursing provider 7a-3p 420cc and 3p-11p 420cc and 11p-7a 160cc . Every Shift Day, Evening, Night  . polyethylene glycol (MIRALAX / GLYCOLAX) 17 g packet Take 17 g by mouth daily.  . Polyvinyl Alcohol-Povidone PF 1.4-0.6 % SOLN Apply 1 drop to eye daily as needed (FOR DRY EYE RELIEF).   Marland Kitchen simethicone (MYLICON) 263 MG chewable tablet Chew 125 mg by mouth every 6 (six) hours as needed for flatulence.  . sodium chloride 1 g tablet Take 1 g by mouth 3 (three) times daily.  . vitamin C (ASCORBIC ACID) 500 MG tablet Take 500 mg by mouth daily.   No facility-administered encounter medications on file as of 11/15/2020.     SIGNIFICANT DIAGNOSTIC EXAMS   PREVIOUS   10-04-20: chest x-ray:  Bilateral layering pleural effusions. Bilateral lower lobe airspace opacities. Heart is normal size. Aortic atherosclerosis. No acute bony abnormality.  10-04-20: ct of abdomen and pelvis 1. Masslike process with marginal enhancement and central necrosis centered in the left hepatic lobe, measuring 14.2 by 10.3 by 9.9 cm (volume = 760 cm^3). Possibilities include malignancy or abscess. Correlate with 2. Linear lucency inferiorly  in segment 3 of the liver could be from a dilated intrahepatic bile duct or possibly portal vein thrombosis of the associated portal vein tributary. 3. Large bilateral pleural effusions with associated passive atelectasis. These effusions are nonspecific for transudative or exudative etiology. 4. Prominent stool throughout much of the colon, but not in the sigmoid colon or rectum. Borderline rectal wall thickening, without surrounding adenopathy. 5. Other imaging findings of potential clinical significance: Coronary atherosclerosis. Small pericardial effusion. Remote compression fractures at L2, L4, and L5. Lumbar spondylosis and degenerative disc disease causing multilevel impingement. Mild prostatomegaly. 6. Despite efforts by the technologist and  patient, motion artifact is present on today's exam and could not be eliminated. This reduces exam sensitivity and specificity. 7. Aortic atherosclerosis.  10-04-20: ct of head:  1. No acute intracranial findings. 2. Periventricular white matter and corona radiata hypodensities favor chronic ischemic microvascular white matter disease. 3. Mild chronic ethmoid sinusitis.  10-07-20: chest x-ray:  Improved resolved right-sided pleural effusion, without pneumothorax. Persistent left pleural effusion, adjacent atelectasis, and interstitial edema.  NO NEW EXAMS   LABS REVIEWED PREVIOUS    10-04-20: wbc 12.0; hgb 12.9; hct 38.8; mcv 96.0 plt 324; glucose 104; bun 18; creat 0.77; k+ 4.2; na++ 121 ca 8.5 GFR >60 alk phos 134; albumin 3.5  Urine culture no growth 10-05-20: vit B 12: 4163 10-06-20: wbc 13.9; hgb 12.3; hct 37.6; mcv 96.2 plt 356; glucose 89; bun 19; creat 0.74; k+ 4.3; na++ 127; ca 8.5 GFR>60 mag 2.0 10-09-20: wbc 11.0; hgb 11.7; hct 36.0; mcv 97.0 plt 342; glucose 88; bun 15; creat 0.72; k+ 3.8; na++ 128; ca 8.3 GFR>60 10-10-20: wbc 10.4; hgb 11.3; hct 35.0; mcv 96.7 plt 359; glucose 88; bun 15; creat 0.72 k+ 3.8; na++ 128; ca 8.3 GFR>60 10-16-20:  glucose 92; bun 14; creat 0.51; k+ 4.3; na++ 127; ca 8.8 GFR>60; mag 2.0 10-21-20: wbc 12.7; hgb 14.7; hct 46.4; mcv 97.9 plt 366; glucose 91; bun 15; creat 0.56; k+ 3.8; na++ 127; ca 8.7 GFR>60; alk phos 248; albumin 4.0; LDH 185; tsh 9.452 10-28-20: glucose 77; bun 15; creat 0.56; k+ 3.2; na++ 131; ca 8.5 GFR>60 10-29-20: k+ 3.9  NO NEW LABS.     Review of Systems  Unable to perform ROS: Dementia (unable to participate )   Physical Exam Constitutional:      General: He is not in acute distress.    Appearance: He is well-developed. He is not diaphoretic.  Neck:     Thyroid: No thyromegaly.  Cardiovascular:     Rate and Rhythm: Normal rate and regular rhythm.     Pulses: Normal pulses.     Heart sounds: Normal heart sounds.  Pulmonary:     Effort: Pulmonary effort is normal. No respiratory distress.     Breath sounds: Normal breath sounds.  Abdominal:     General: Bowel sounds are normal. There is no distension.     Palpations: Abdomen is soft.     Tenderness: There is no abdominal tenderness.  Musculoskeletal:     Cervical back: Neck supple.     Right lower leg: No edema.     Left lower leg: No edema.     Comments: Is able to move all extremities Has abnormal movements present   Lymphadenopathy:     Cervical: No cervical adenopathy.  Skin:    General: Skin is warm and dry.  Neurological:     Mental Status: He is alert. Mental status is at baseline.  Psychiatric:        Mood and Affect: Mood normal.       ASSESSMENT/ PLAN:  TODAY  1. Sleep apnea in adult:  Is worse will begin 02 at 2L/[place] at hs.    Ok Edwards NP [facility]  Contact [phone] Monday through Friday 8am- 5pm  After hours call [phone]

## 2020-11-18 DIAGNOSIS — E871 Hypo-osmolality and hyponatremia: Secondary | ICD-10-CM | POA: Diagnosis not present

## 2020-11-18 DIAGNOSIS — C434 Malignant melanoma of scalp and neck: Secondary | ICD-10-CM | POA: Diagnosis not present

## 2020-11-18 DIAGNOSIS — R279 Unspecified lack of coordination: Secondary | ICD-10-CM | POA: Diagnosis not present

## 2020-11-18 DIAGNOSIS — C787 Secondary malignant neoplasm of liver and intrahepatic bile duct: Secondary | ICD-10-CM | POA: Diagnosis not present

## 2020-11-18 DIAGNOSIS — G231 Progressive supranuclear ophthalmoplegia [Steele-Richardson-Olszewski]: Secondary | ICD-10-CM | POA: Diagnosis not present

## 2020-11-18 DIAGNOSIS — M6281 Muscle weakness (generalized): Secondary | ICD-10-CM | POA: Diagnosis not present

## 2020-11-18 DIAGNOSIS — Z515 Encounter for palliative care: Secondary | ICD-10-CM | POA: Diagnosis not present

## 2020-11-19 ENCOUNTER — Encounter (HOSPITAL_COMMUNITY): Payer: Self-pay

## 2020-11-19 DIAGNOSIS — G231 Progressive supranuclear ophthalmoplegia [Steele-Richardson-Olszewski]: Secondary | ICD-10-CM | POA: Diagnosis not present

## 2020-11-19 DIAGNOSIS — R279 Unspecified lack of coordination: Secondary | ICD-10-CM | POA: Diagnosis not present

## 2020-11-19 DIAGNOSIS — E871 Hypo-osmolality and hyponatremia: Secondary | ICD-10-CM | POA: Diagnosis not present

## 2020-11-19 DIAGNOSIS — M6281 Muscle weakness (generalized): Secondary | ICD-10-CM | POA: Diagnosis not present

## 2020-11-20 ENCOUNTER — Ambulatory Visit: Payer: Medicare PPO | Admitting: Gastroenterology

## 2020-11-20 ENCOUNTER — Encounter: Payer: Self-pay | Admitting: Gastroenterology

## 2020-11-20 VITALS — BP 121/67 | HR 85 | Temp 96.2°F | Ht 72.0 in | Wt 150.0 lb

## 2020-11-20 DIAGNOSIS — C787 Secondary malignant neoplasm of liver and intrahepatic bile duct: Secondary | ICD-10-CM | POA: Diagnosis not present

## 2020-11-20 DIAGNOSIS — E871 Hypo-osmolality and hyponatremia: Secondary | ICD-10-CM | POA: Diagnosis not present

## 2020-11-20 DIAGNOSIS — K5900 Constipation, unspecified: Secondary | ICD-10-CM

## 2020-11-20 DIAGNOSIS — R279 Unspecified lack of coordination: Secondary | ICD-10-CM | POA: Diagnosis not present

## 2020-11-20 DIAGNOSIS — M6281 Muscle weakness (generalized): Secondary | ICD-10-CM | POA: Diagnosis not present

## 2020-11-20 DIAGNOSIS — G231 Progressive supranuclear ophthalmoplegia [Steele-Richardson-Olszewski]: Secondary | ICD-10-CM | POA: Diagnosis not present

## 2020-11-20 NOTE — Patient Instructions (Addendum)
1. Continue Linzess 267mcg daily.  2. Continue miralax 17 grams daily.  3. I will reach out to nursing home about thyroid labs and fluid restrictions.  4. Call with any questions or concerns

## 2020-11-20 NOTE — Progress Notes (Signed)
Primary Care Physician: [name], MD  Primary Gastroenterologist:  Garfield Cornea, MD   Chief Complaint  Patient presents with  . liver mass  . Constipation    Doing ok on Linzess 290 and miralax    HPI: Luis Freeman is a 87 y.o. male here for follow-up of severe constipation, requiring recent hospitalization.  CT at that time showed liver mass to the left hepatic lobe measuring 14.2 x 10.3 x 9.9 cm.  Ultrasound-guided liver biopsy on March 22 showed findings consistent with metastatic melanoma.  Patient being followed by Dr. Delton Coombes.  Patient also has bilateral pleural effusions, right greater than left, likely malignant.  At this time he has not been offered immunotherapy given poor functional status and progressive supranuclear palsy.  He presents today with 2 of his daughters.  He currently resides in a skilled nursing facility.  Palliative care currently involved.  Patient in rehab trying to get stronger with goals of possibly qualifying for immunotherapy.  If not, plans to switch to hospice.  Family has concerns about patient's ongoing fluid restrictions for history of hyponatremia.  During recent hospitalization he had acute on chronic hyponatremia with progressive mental status changes.  Patient presents with bowel movement mauled from [facility].  Over the past 2 weeks, patient has had a bowel movement at least 5 times per week.  Often medium to large amount of stool.  Per family, eating fairly well.  Does not appear to have any abdominal pain.  Patient unable to provide any history.  Reported melena or rectal bleeding.  Labs from April 4 show TSH of 9.452.  Sodium 126, creatinine 0.67, alkaline phosphatase 248, AST 31, ALT 12, albumin 4, white blood cell count 12,700, hemoglobin 14.7, platelets 366,000.  On April 11 sodium was 131.    No current outpatient medications on file.   No current facility-administered medications for this visit.   Outpatient  Medications Prior to Visit  Medication Sig Dispense Refill  . acetaminophen (TYLENOL) 325 MG tablet Take 2 tablets (650 mg total) by mouth every 6 (six) hours as needed for mild pain (or Fever >/= 101). 12 tablet 0  . amLODipine (NORVASC) 10 MG tablet Take 1 tablet (10 mg total) by mouth daily. For BP 30 tablet 3  . Balsam [place]-Castor Oil (VENELEX) OINT Apply topically. Special Instructions: Apply Venelex to sacrum and buttocks Q shift and after each incontinence episode.    . bisacodyl (DULCOLAX) 10 MG suppository Place 1 suppository (10 mg total) rectally daily as needed for mild constipation or moderate constipation. 12 suppository 0  . carbidopa-levodopa (SINEMET IR) 25-100 MG tablet Take 1.5 tablets by mouth 3 (three) times daily. 405 tablet 1  . cholecalciferol (VITAMIN D) 1000 units tablet Take 1,000 Units by mouth in the morning.     . Cranberry 500 MG CAPS Take 1 capsule by mouth daily.    . cyanocobalamin (,VITAMIN B-12,) 1000 MCG/ML injection Inject 1,000 mcg into the muscle every 30 (thirty) days.   1  . finasteride (PROSCAR) 5 MG tablet Take 1 tablet (5 mg total) by mouth daily. 30 tablet 3  . hydrALAZINE (APRESOLINE) 25 MG tablet Take 1.5 tablets (37.5 mg total) by mouth 2 (two) times daily. 90 tablet 3  . linaclotide (LINZESS) 290 MCG CAPS capsule Take 290 mcg by mouth daily before breakfast.    . lisinopril (ZESTRIL) 20 MG tablet Take 20 mg by mouth daily.    . memantine (NAMENDA) 10  MG tablet Take 10 mg by mouth daily with lunch.    . NON FORMULARY Diet Regular    . NON FORMULARY Fluid restrictions 1500CC in 24 hours. Dietary 500cc Nursing provider 7a-3p 420cc and 3p-11p 420cc and 11p-7a 160cc . Every Shift Day, Evening, Night    . polyethylene glycol (MIRALAX / GLYCOLAX) 17 g packet Take 17 g by mouth daily.    . Polyvinyl Alcohol-Povidone PF 1.4-0.6 % SOLN Apply 1 drop to eye daily as needed (FOR DRY EYE RELIEF).     Marland Kitchen simethicone (MYLICON) 096 MG chewable tablet Chew 125 mg  by mouth every 6 (six) hours as needed for flatulence.    . sodium chloride 1 g tablet Take 1 g by mouth 3 (three) times daily.    . vitamin C (ASCORBIC ACID) 500 MG tablet Take 500 mg by mouth daily.     No facility-administered medications prior to visit.     Allergies as of 11/20/2020 - Review Complete 11/20/2020  Allergen Reaction Noted  . Flomax [tamsulosin hcl]  10/24/2020    ROS: Unable to obtain from patient.   Physical Examination:   BP 121/67   Pulse 85   Temp (!) 96.2 F (35.7 C) (Temporal)   Ht 6' (1.829 m)   Wt 150 lb (68 kg) Comment: stated by family member; weighed yesterday  BMI 20.34 kg/m   General: Chronically ill-appearing in no acute distress.  Eyes: No icterus. Mouth: masked Lungs: Clear to auscultation bilaterally.  Heart: Regular rate and rhythm, no murmurs rubs or gallops.  Abdomen: Bowel sounds are normal, nontender, nondistended, no hepatosplenomegaly or masses, no abdominal bruits or hernia , no rebound or guarding.   Extremities: No lower extremity edema. No clubbing or deformities. Neuro: unable to evaluate Skin: Warm and dry, no jaundice.   Psych: Unable to evaluate.  Labs:  Lab Results  Component Value Date   CREATININE 0.56 (L) 10/28/2020   BUN 15 10/28/2020   NA 131 (L) 10/28/2020   K 3.9 10/29/2020   CL 99 10/28/2020   CO2 22 10/28/2020   Lab Results  Component Value Date   ALT 12 10/21/2020   AST 31 10/21/2020   ALKPHOS 248 (H) 10/21/2020   BILITOT 0.8 10/21/2020   Lab Results  Component Value Date   WBC 12.7 (H) 10/21/2020   HGB 14.7 10/21/2020   HCT 46.4 10/21/2020   MCV 97.9 10/21/2020   PLT 366 10/21/2020   Lab Results  Component Value Date   TSH 9.452 (H) 10/21/2020     Imaging Studies: CT Chest W Contrast  Result Date: 11/03/2020 CLINICAL DATA:  Metastatic melanoma, metastatic to liver EXAM: CT CHEST WITH CONTRAST TECHNIQUE: Multidetector CT imaging of the chest was performed during intravenous contrast  administration. CONTRAST:  18mL OMNIPAQUE IOHEXOL 300 MG/ML  SOLN COMPARISON:  CT abdomen pelvis, 10/04/2020 FINDINGS: Cardiovascular: Aortic atherosclerosis. Normal heart size. Three-vessel coronary artery calcifications. No pericardial effusion. Mediastinum/Nodes: No enlarged mediastinal, hilar, or axillary lymph nodes. Thyroid gland, trachea, and esophagus demonstrate no significant findings. Lungs/Pleura: Moderate, bilateral right greater than left pleural effusions and associated atelectasis or consolidation. Visceral pleural calcification of the posterior right pulmonary apex (series 2, image 28). Upper Abdomen: Trace perihepatic and perisplenic ascites. Redemonstrated, large lobulated mass of the anterior left lobe of the liver, better seen on prior dedicated imaging of the abdomen and pelvis. Musculoskeletal: No chest wall mass or suspicious bone lesions identified. IMPRESSION: 1. Moderate, bilateral right greater than left pleural effusions and associated  atelectasis or consolidation, similar to prior CT of the abdomen and pelvis. These are suspicious for malignant effusions in the setting of known metastatic disease, however there is no direct evidence of pleural nodularity or thickening. 2. Trace perihepatic and perisplenic ascites. 3. Redemonstrated, large lobulated mass of the anterior left lobe of the liver, better seen on prior dedicated imaging of the abdomen and pelvis. 4. Coronary artery disease. Aortic Atherosclerosis (ICD10-I70.0). Electronically Signed   By: Eddie Candle M.D.   On: 11/03/2020 12:03   MR Brain W Wo Contrast  Result Date: 11/01/2020 CLINICAL DATA:  Metastatic melanoma to the liver. Intracranial staging EXAM: MRI HEAD WITHOUT AND WITH CONTRAST TECHNIQUE: Multiplanar, multiecho pulse sequences of the brain and surrounding structures were obtained without and with intravenous contrast. CONTRAST:  20mL GADAVIST GADOBUTROL 1 MMOL/ML IV SOLN COMPARISON:  Head CT 10/04/2020 FINDINGS:  Brain: No enhancement or swelling to suggest metastatic disease. No incidental infarct, hydrocephalus, or hematoma. Generalized atrophy. Periventricular chronic small vessel ischemia. Vascular: Preserved flow voids and vascular enhancements. Skull and upper cervical spine: Scalp defect in the posterior left parietal region without underlying marrow signal abnormality or nodular soft tissue component. Sinuses/Orbits: Negative IMPRESSION: No evidence of metastatic disease. Electronically Signed   By: Monte Fantasia M.D.   On: 11/01/2020 22:17   Assessment/plan:  Pleasant 87 year old male with history of dementia, progressive supranuclear palsy, history of fall with subdural hematoma in July 2021, progressive mental status decline, recent acute on chronic hyponatremia during hospitalization, we saw during recent civilization for progressive constipation and liver mass.  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  Underwent Gastrografin enema.  Started on Linzess 290 mcg daily.  MiraLAX 17 g daily.  Was able to have multiple bowel movements.  Continues to do well with bowel function as an outpatient.  Hyponatremia: Chronic finding.  Has had issues with hyponatremia dating back at least to July 2019.  Acute drop during recent hospitalization.  Appears to be on fluid restrictions.  Patient's family wonders if this can be lifted at this time.  Last sodium of 131.  They would like to be able to liberalize patient's diet given overall debilitation and weakness.  Metastatic melanoma with large liver lesion: Followed by Dr. Delton Coombes.  Immunotherapy has not been offered due to overall poor functional status and progressive super nuclear palsy.  According to family, goals would be to see if his functional status would improve with physical therapy but if not consider transitioning to hospice.    Abnormal TSH: According to nursing home note from April 7, plans to start Synthroid but  I do not see it on current medication list.  1. Will reach out to the nursing home provider regarding possibility of starting Synthroid for elevated TSH if appropriate. 2. Continue Linzess 290 mcg daily. 3. Continue MiraLAX 17 g daily. 4. Will also reach out to nursing home provider regarding possibility of lifting fluid restrictions if appropriate.

## 2020-11-21 DIAGNOSIS — M6281 Muscle weakness (generalized): Secondary | ICD-10-CM | POA: Diagnosis not present

## 2020-11-21 DIAGNOSIS — G231 Progressive supranuclear ophthalmoplegia [Steele-Richardson-Olszewski]: Secondary | ICD-10-CM | POA: Diagnosis not present

## 2020-11-21 DIAGNOSIS — E871 Hypo-osmolality and hyponatremia: Secondary | ICD-10-CM | POA: Diagnosis not present

## 2020-11-21 DIAGNOSIS — R279 Unspecified lack of coordination: Secondary | ICD-10-CM | POA: Diagnosis not present

## 2020-11-21 DIAGNOSIS — S065X0D Traumatic subdural hemorrhage without loss of consciousness, subsequent encounter: Secondary | ICD-10-CM | POA: Diagnosis not present

## 2020-11-25 ENCOUNTER — Encounter: Payer: Self-pay | Admitting: Gastroenterology

## 2020-11-26 DIAGNOSIS — G4730 Sleep apnea, unspecified: Secondary | ICD-10-CM | POA: Insufficient documentation

## 2020-11-27 NOTE — Progress Notes (Signed)
CC'ED TO PCP 

## 2020-11-28 ENCOUNTER — Other Ambulatory Visit (HOSPITAL_COMMUNITY)
Admission: RE | Admit: 2020-11-28 | Discharge: 2020-11-28 | Disposition: A | Discharge status: Home / Self Care | Payer: Medicare PPO | Source: Skilled Nursing Facility | Attending: Adult Health | Admitting: Adult Health

## 2020-11-28 DIAGNOSIS — E039 Hypothyroidism, unspecified: Secondary | ICD-10-CM | POA: Insufficient documentation

## 2020-11-28 LAB — TSH: TSH: 16.205 u[IU]/mL — ABNORMAL HIGH (ref 0.350–4.500)

## 2020-12-02 ENCOUNTER — Encounter: Payer: Self-pay | Admitting: Adult Health

## 2020-12-02 ENCOUNTER — Non-Acute Institutional Stay (SKILLED_NURSING_FACILITY): Payer: Medicare PPO | Admitting: Adult Health

## 2020-12-02 DIAGNOSIS — R627 Adult failure to thrive: Secondary | ICD-10-CM

## 2020-12-02 DIAGNOSIS — F0150 Vascular dementia without behavioral disturbance: Secondary | ICD-10-CM

## 2020-12-02 DIAGNOSIS — C787 Secondary malignant neoplasm of liver and intrahepatic bile duct: Secondary | ICD-10-CM

## 2020-12-02 DIAGNOSIS — G231 Progressive supranuclear ophthalmoplegia [Steele-Richardson-Olszewski]: Secondary | ICD-10-CM | POA: Diagnosis not present

## 2020-12-02 NOTE — Progress Notes (Signed)
Location:  [facility] Room Number: 133-P Place of Service:  SNF (31)   CODE STATUS: DNR  Allergies  Allergen Reactions  . Flomax [Tamsulosin Hcl]     Seizure    Chief Complaint  Patient presents with  . Acute Visit    Medication review for hospice      HPI:  He is followed by hospice care. I have reviewed his medications with his family. They desire for his vitamins to be stopped and are ok with his hydralazine being stopped. His family has stated that they are willing to lower his sinemet to 1 tab three times daily; however had told hospice that they did not want this medication changed.   Past Medical History:  Diagnosis Date  . Chronic low back pain   . Gait abnormality 11/15/2019  . Hypertension   . Metastatic melanoma to liver ([place])   . PSP (progressive supranuclear palsy) ([place]) 11/15/2019  . Vitamin B12 deficiency     Past Surgical History:  Procedure Laterality Date  . CATARACT EXTRACTION, BILATERAL    . COLONOSCOPY    . COLONOSCOPY  02/04/2011   Procedure: COLONOSCOPY;  Surgeon: Daneil Dolin, MD;  Location: [facility];  Service: Endoscopy;  Laterality: N/A;  . removal of malignant melanoma  2016    Social History   Socioeconomic History  . Marital status: Married    Spouse name: Not on file  . Number of children: 4  . Years of education: Not on file  . Highest education level: Not on file  Occupational History  . Occupation: retired  Tobacco Use  . Smoking status: Never Smoker  . Smokeless tobacco: Never Used  Vaping Use  . Vaping Use: Never used  Substance and Sexual Activity  . Alcohol use: Not Currently    Alcohol/week: 0.0 standard drinks  . Drug use: No  . Sexual activity: Not Currently  Other Topics Concern  . Not on file  Social History Narrative  . Not on file   Social Determinants of Health   Financial Resource Strain: Low Risk   . Difficulty of Paying Living Expenses: Not hard at all  Food Insecurity:  No Food Insecurity  . Worried About Charity fundraiser in the Last Year: Never true  . Ran Out of Food in the Last Year: Never true  Transportation Needs: No Transportation Needs  . Lack of Transportation (Medical): No  . Lack of Transportation (Non-Medical): No  Physical Activity: Inactive  . Days of Exercise per Week: 0 days  . Minutes of Exercise per Session: 0 min  Stress: No Stress Concern Present  . Feeling of Stress : Only a little  Social Connections: Socially Integrated  . Frequency of Communication with Friends and Family: More than three times a week  . Frequency of Social Gatherings with Friends and Family: More than three times a week  . Attends Religious Services: More than 4 times per year  . Active Member of Clubs or Organizations: Yes  . Attends Archivist Meetings: Not on file  . Marital Status: Married  Human resources officer Violence: Not At Risk  . Fear of Current or Ex-Partner: No  . Emotionally Abused: No  . Physically Abused: No  . Sexually Abused: No   Family History  Problem Relation Age of Onset  . Diabetes Mother   . Cancer Father   . Cancer - Lung Brother       VITAL SIGNS BP 122/68  Pulse 84   Temp 97.8 F (36.6 C)   Resp 20   Ht 6' (1.829 m)   Wt 150 lb (68 kg)   SpO2 90%   BMI 20.34 kg/m   Outpatient Encounter Medications as of 12/02/2020  Medication Sig  . acetaminophen (TYLENOL) 325 MG tablet Take 2 tablets (650 mg total) by mouth every 6 (six) hours as needed for mild pain (or Fever >/= 101).  Marland Kitchen acetaminophen (TYLENOL) 325 MG tablet Take 325 mg by mouth 3 (three) times daily. Take 2 tablets, three times a day.  Marland Kitchen amLODipine (NORVASC) 10 MG tablet Take 1 tablet (10 mg total) by mouth daily. For BP  . Balsam [place]-Castor Oil (VENELEX) OINT Apply topically. Special Instructions: Apply Venelex to sacrum and buttocks Q shift and after each incontinence episode.  . bisacodyl (DULCOLAX) 10 MG suppository Place 1 suppository (10 mg  total) rectally daily as needed for mild constipation or moderate constipation.  . carbidopa-levodopa (SINEMET IR) 25-100 MG tablet Take 1.5 tablets by mouth 3 (three) times daily.  . cholecalciferol (VITAMIN D) 1000 units tablet Take 1,000 Units by mouth in the morning.   . Cranberry 500 MG CAPS Take 1 capsule by mouth daily.  . cyanocobalamin (,VITAMIN B-12,) 1000 MCG/ML injection Inject 1,000 mcg into the muscle every 30 (thirty) days.   . finasteride (PROSCAR) 5 MG tablet Take 1 tablet (5 mg total) by mouth daily.  . hydrALAZINE (APRESOLINE) 25 MG tablet Take 1.5 tablets (37.5 mg total) by mouth 2 (two) times daily.  Marland Kitchen levothyroxine (SYNTHROID) 25 MCG tablet Take 25 mcg by mouth daily before breakfast.  . linaclotide (LINZESS) 290 MCG CAPS capsule Take 290 mcg by mouth daily before breakfast.  . lisinopril (ZESTRIL) 20 MG tablet Take 20 mg by mouth daily.  . memantine (NAMENDA) 10 MG tablet Take 10 mg by mouth daily with lunch.  . NON FORMULARY Diet Regular  . NON FORMULARY Fluid restrictions 1500CC in 24 hours. Dietary 500cc Nursing provider 7a-3p 420cc and 3p-11p 420cc and 11p-7a 160cc . Every Shift Day, Evening, Night  . polyethylene glycol (MIRALAX / GLYCOLAX) 17 g packet Take 17 g by mouth daily.  . Polyvinyl Alcohol-Povidone PF 1.4-0.6 % SOLN Apply 1 drop to eye daily as needed (FOR DRY EYE RELIEF).   Marland Kitchen simethicone (MYLICON) 762 MG chewable tablet Chew 125 mg by mouth every 6 (six) hours as needed for flatulence.  . sodium chloride 1 g tablet Take 1 g by mouth 3 (three) times daily.  . vitamin C (ASCORBIC ACID) 500 MG tablet Take 500 mg by mouth daily.   No facility-administered encounter medications on file as of 12/02/2020.     SIGNIFICANT DIAGNOSTIC EXAMS  PREVIOUS   10-04-20: chest x-ray:  Bilateral layering pleural effusions. Bilateral lower lobe airspace opacities. Heart is normal size. Aortic atherosclerosis. No acute bony abnormality.  10-04-20: ct of abdomen and  pelvis 1. Masslike process with marginal enhancement and central necrosis centered in the left hepatic lobe, measuring 14.2 by 10.3 by 9.9 cm (volume = 760 cm^3). Possibilities include malignancy or abscess. Correlate with 2. Linear lucency inferiorly in segment 3 of the liver could be from a dilated intrahepatic bile duct or possibly portal vein thrombosis of the associated portal vein tributary. 3. Large bilateral pleural effusions with associated passive atelectasis. These effusions are nonspecific for transudative or exudative etiology. 4. Prominent stool throughout much of the colon, but not in the sigmoid colon or rectum. Borderline rectal wall thickening, without surrounding  adenopathy. 5. Other imaging findings of potential clinical significance: Coronary atherosclerosis. Small pericardial effusion. Remote compression fractures at L2, L4, and L5. Lumbar spondylosis and degenerative disc disease causing multilevel impingement. Mild prostatomegaly. 6. Despite efforts by the technologist and patient, motion artifact is present on today's exam and could not be eliminated. This reduces exam sensitivity and specificity. 7. Aortic atherosclerosis.  10-04-20: ct of head:  1. No acute intracranial findings. 2. Periventricular white matter and corona radiata hypodensities favor chronic ischemic microvascular white matter disease. 3. Mild chronic ethmoid sinusitis.  10-07-20: chest x-ray:  Improved resolved right-sided pleural effusion, without pneumothorax. Persistent left pleural effusion, adjacent atelectasis, and interstitial edema.  NO NEW EXAMS   LABS REVIEWED PREVIOUS    10-04-20: wbc 12.0; hgb 12.9; hct 38.8; mcv 96.0 plt 324; glucose 104; bun 18; creat 0.77; k+ 4.2; na++ 121 ca 8.5 GFR >60 alk phos 134; albumin 3.5  Urine culture no growth 10-05-20: vit B 12: 4163 10-06-20: wbc 13.9; hgb 12.3; hct 37.6; mcv 96.2 plt 356; glucose 89; bun 19; creat 0.74; k+ 4.3; na++ 127; ca 8.5 GFR>60 mag  2.0 10-09-20: wbc 11.0; hgb 11.7; hct 36.0; mcv 97.0 plt 342; glucose 88; bun 15; creat 0.72; k+ 3.8; na++ 128; ca 8.3 GFR>60 10-10-20: wbc 10.4; hgb 11.3; hct 35.0; mcv 96.7 plt 359; glucose 88; bun 15; creat 0.72 k+ 3.8; na++ 128; ca 8.3 GFR>60 10-16-20: glucose 92; bun 14; creat 0.51; k+ 4.3; na++ 127; ca 8.8 GFR>60; mag 2.0 10-21-20: wbc 12.7; hgb 14.7; hct 46.4; mcv 97.9 plt 366; glucose 91; bun 15; creat 0.56; k+ 3.8; na++ 127; ca 8.7 GFR>60; alk phos 248; albumin 4.0; LDH 185; tsh 9.452 10-28-20: glucose 77; bun 15; creat 0.56; k+ 3.2; na++ 131; ca 8.5 GFR>60 10-29-20: k+ 3.9  NO NEW LABS.      Review of Systems  Unable to perform ROS: Dementia (unable to participate )    Physical Exam Constitutional:      General: He is not in acute distress.    Appearance: He is well-developed. He is not diaphoretic.     Comments: Is aware   Neck:     Thyroid: No thyromegaly.  Cardiovascular:     Rate and Rhythm: Normal rate and regular rhythm.     Pulses: Normal pulses.     Heart sounds: Normal heart sounds.  Pulmonary:     Effort: Pulmonary effort is normal. No respiratory distress.     Breath sounds: Normal breath sounds.  Abdominal:     General: Bowel sounds are normal. There is distension.     Palpations: Abdomen is soft.     Tenderness: There is no abdominal tenderness.  Musculoskeletal:     Cervical back: Neck supple.     Right lower leg: No edema.     Left lower leg: No edema.     Comments: No abnormal movements present. Has extremity stiffness present   Lymphadenopathy:     Cervical: No cervical adenopathy.  Skin:    General: Skin is warm and dry.  Psychiatric:        Mood and Affect: Mood normal.       ASSESSMENT/ PLAN:  TODAY  1. Failure to thrive in adult 2. Vascular dementia without behavioral disturbance 3. Metastatic melanoma to liver 4. PSP (progressive supranuclear palsy)  Will stop the following medications Hydralazine Vitamins: d; c cranberry; b 12 No  changes to sinemet at this time Will continue to monitor his status.  Ok Edwards NP [facility]  Contact [phone] Monday through Friday 8am- 5pm  After hours call [phone]

## 2020-12-03 ENCOUNTER — Ambulatory Visit (HOSPITAL_COMMUNITY): Payer: Medicare PPO | Admitting: Hematology

## 2020-12-03 ENCOUNTER — Encounter: Payer: Self-pay | Admitting: Adult Health

## 2020-12-03 ENCOUNTER — Inpatient Hospital Stay (HOSPITAL_COMMUNITY): Payer: Medicare PPO

## 2020-12-03 DIAGNOSIS — R627 Adult failure to thrive: Secondary | ICD-10-CM | POA: Insufficient documentation

## 2020-12-17 ENCOUNTER — Non-Acute Institutional Stay (SKILLED_NURSING_FACILITY): Payer: Medicare PPO | Admitting: Adult Health

## 2020-12-17 ENCOUNTER — Encounter: Payer: Self-pay | Admitting: Adult Health

## 2020-12-17 DIAGNOSIS — C787 Secondary malignant neoplasm of liver and intrahepatic bile duct: Secondary | ICD-10-CM

## 2020-12-17 DIAGNOSIS — I700 Atherosclerosis of aorta: Secondary | ICD-10-CM

## 2020-12-17 DIAGNOSIS — E538 Deficiency of other specified B group vitamins: Secondary | ICD-10-CM

## 2020-12-17 NOTE — Progress Notes (Signed)
Location:  [facility] Room Number: 133-P Place of Service:  SNF (31)   CODE STATUS: DNR  Allergies  Allergen Reactions  . Flomax [Tamsulosin Hcl]     Seizure     Chief Complaint  Patient presents with  . Medical Management of Chronic Issues           Aortic atherosclerosis     Metastatic melanoma to liver:  Vit B 12 deficiency    HPI:  He is a 87 year old long term resident of this facility being seen for the management of his chronic illnesses:Aortic atherosclerosis     Metastatic melanoma to liver:  Vit B 12 deficiency. He continues to be followed by hospice care. there are no indications of pain; distress or anxiety    Past Medical History:  Diagnosis Date  . Chronic low back pain   . Gait abnormality 11/15/2019  . Hypertension   . Metastatic melanoma to liver ([place])   . PSP (progressive supranuclear palsy) ([place]) 11/15/2019  . Vitamin B12 deficiency     Past Surgical History:  Procedure Laterality Date  . CATARACT EXTRACTION, BILATERAL    . COLONOSCOPY    . COLONOSCOPY  02/04/2011   Procedure: COLONOSCOPY;  Surgeon: Daneil Dolin, MD;  [facility];  Service: Endoscopy;  Laterality: N/A;  . removal of malignant melanoma  2016    Social History   Socioeconomic History  . Marital status: Married    Spouse name: Not on file  . Number of children: 4  . Years of education: Not on file  . Highest education level: Not on file  Occupational History  . Occupation: retired  Tobacco Use  . Smoking status: Never Smoker  . Smokeless tobacco: Never Used  Vaping Use  . Vaping Use: Never used  Substance and Sexual Activity  . Alcohol use: Not Currently    Alcohol/week: 0.0 standard drinks  . Drug use: No  . Sexual activity: Not Currently  Other Topics Concern  . Not on file  Social History Narrative  . Not on file   Social Determinants of Health   Financial Resource Strain: Low Risk   . Difficulty of Paying Living Expenses: Not  hard at all  Food Insecurity: No Food Insecurity  . Worried About Charity fundraiser in the Last Year: Never true  . Ran Out of Food in the Last Year: Never true  Transportation Needs: No Transportation Needs  . Lack of Transportation (Medical): No  . Lack of Transportation (Non-Medical): No  Physical Activity: Inactive  . Days of Exercise per Week: 0 days  . Minutes of Exercise per Session: 0 min  Stress: No Stress Concern Present  . Feeling of Stress : Only a little  Social Connections: Socially Integrated  . Frequency of Communication with Friends and Family: More than three times a week  . Frequency of Social Gatherings with Friends and Family: More than three times a week  . Attends Religious Services: More than 4 times per year  . Active Member of Clubs or Organizations: Yes  . Attends Archivist Meetings: Not on file  . Marital Status: Married  Human resources officer Violence: Not At Risk  . Fear of Current or Ex-Partner: No  . Emotionally Abused: No  . Physically Abused: No  . Sexually Abused: No   Family History  Problem Relation Age of Onset  . Diabetes Mother   . Cancer Father   . Cancer -  Lung Brother       VITAL SIGNS BP (!) 148/63   Pulse 79   Temp 97.7 F (36.5 C)   Resp 20   Ht 6' (1.829 m)   Wt 150 lb (68 kg)   SpO2 93%   BMI 20.34 kg/m   Outpatient Encounter Medications as of 12/17/2020  Medication Sig  . acetaminophen (TYLENOL) 325 MG tablet Take 2 tablets (650 mg total) by mouth every 6 (six) hours as needed for mild pain (or Fever >/= 101).  Marland Kitchen acetaminophen (TYLENOL) 325 MG tablet Take 650 mg by mouth 3 (three) times daily.  Marland Kitchen amLODipine (NORVASC) 10 MG tablet Take 1 tablet (10 mg total) by mouth daily. For BP  . Balsam [place]-Castor Oil (VENELEX) OINT Apply topically. Special Instructions: Apply Venelex to sacrum and buttocks Q shift and after each incontinence episode.  . bisacodyl (DULCOLAX) 10 MG suppository Place 1 suppository (10 mg  total) rectally daily as needed for mild constipation or moderate constipation.  . carbidopa-levodopa (SINEMET IR) 25-100 MG tablet Take 1 tablets by mouth 3 (three) times daily.  . finasteride (PROSCAR) 5 MG tablet Take 1 tablet (5 mg total) by mouth daily.  Marland Kitchen levothyroxine (SYNTHROID) 25 MCG tablet Take 25 mcg by mouth daily before breakfast.  . linaclotide (LINZESS) 290 MCG CAPS capsule Take 290 mcg by mouth daily before breakfast.  . lisinopril (ZESTRIL) 20 MG tablet Take 20 mg by mouth daily.  . memantine (NAMENDA) 10 MG tablet Take 10 mg by mouth daily with lunch.  . NON FORMULARY Diet Regular  . OXYGEN Inhale 2 L into the lungs See admin instructions. PRN while awake to keep O2 sats above 90%  . polyethylene glycol (MIRALAX / GLYCOLAX) 17 g packet Take 17 g by mouth daily.  . Polyvinyl Alcohol-Povidone PF 1.4-0.6 % SOLN Apply 1 drop to eye daily as needed (FOR DRY EYE RELIEF).   Marland Kitchen simethicone (MYLICON) 800 MG chewable tablet Chew 125 mg by mouth every 6 (six) hours as needed for flatulence.  . sodium chloride 1 g tablet Take 1 g by mouth 3 (three) times daily.   No facility-administered encounter medications on file as of 12/17/2020.     SIGNIFICANT DIAGNOSTIC EXAMS   PREVIOUS   10-04-20: chest x-ray:  Bilateral layering pleural effusions. Bilateral lower lobe airspace opacities. Heart is normal size. Aortic atherosclerosis. No acute bony abnormality.  10-04-20: ct of abdomen and pelvis 1. Masslike process with marginal enhancement and central necrosis centered in the left hepatic lobe, measuring 14.2 by 10.3 by 9.9 cm (volume = 760 cm^3). Possibilities include malignancy or abscess. Correlate with 2. Linear lucency inferiorly in segment 3 of the liver could be from a dilated intrahepatic bile duct or possibly portal vein thrombosis of the associated portal vein tributary. 3. Large bilateral pleural effusions with associated passive atelectasis. These effusions are nonspecific for  transudative or exudative etiology. 4. Prominent stool throughout much of the colon, but not in the sigmoid colon or rectum. Borderline rectal wall thickening, without surrounding adenopathy. 5. Other imaging findings of potential clinical significance: Coronary atherosclerosis. Small pericardial effusion. Remote compression fractures at L2, L4, and L5. Lumbar spondylosis and degenerative disc disease causing multilevel impingement. Mild prostatomegaly. 6. Despite efforts by the technologist and patient, motion artifact is present on today's exam and could not be eliminated. This reduces exam sensitivity and specificity. 7. Aortic atherosclerosis.  10-04-20: ct of head:  1. No acute intracranial findings. 2. Periventricular white matter and corona radiata  hypodensities favor chronic ischemic microvascular white matter disease. 3. Mild chronic ethmoid sinusitis.  10-07-20: chest x-ray:  Improved resolved right-sided pleural effusion, without pneumothorax. Persistent left pleural effusion, adjacent atelectasis, and interstitial edema.  NO NEW EXAMS   LABS REVIEWED PREVIOUS    10-04-20: wbc 12.0; hgb 12.9; hct 38.8; mcv 96.0 plt 324; glucose 104; bun 18; creat 0.77; k+ 4.2; na++ 121 ca 8.5 GFR >60 alk phos 134; albumin 3.5  Urine culture no growth 10-05-20: vit B 12: 4163 10-06-20: wbc 13.9; hgb 12.3; hct 37.6; mcv 96.2 plt 356; glucose 89; bun 19; creat 0.74; k+ 4.3; na++ 127; ca 8.5 GFR>60 mag 2.0 10-09-20: wbc 11.0; hgb 11.7; hct 36.0; mcv 97.0 plt 342; glucose 88; bun 15; creat 0.72; k+ 3.8; na++ 128; ca 8.3 GFR>60 10-10-20: wbc 10.4; hgb 11.3; hct 35.0; mcv 96.7 plt 359; glucose 88; bun 15; creat 0.72 k+ 3.8; na++ 128; ca 8.3 GFR>60 10-16-20: glucose 92; bun 14; creat 0.51; k+ 4.3; na++ 127; ca 8.8 GFR>60; mag 2.0 10-21-20: wbc 12.7; hgb 14.7; hct 46.4; mcv 97.9 plt 366; glucose 91; bun 15; creat 0.56; k+ 3.8; na++ 127; ca 8.7 GFR>60; alk phos 248; albumin 4.0; LDH 185; tsh 9.452 10-28-20: glucose  77; bun 15; creat 0.56; k+ 3.2; na++ 131; ca 8.5 GFR>60 10-29-20: k+ 3.9   NO NEW LABS.     Review of Systems  Unable to perform ROS: Dementia (unable to participate )    Physical Exam Constitutional:      General: He is awake. He is not in acute distress.    Appearance: He is underweight. He is not diaphoretic.  Neck:     Thyroid: No thyromegaly.  Cardiovascular:     Rate and Rhythm: Normal rate and regular rhythm.     Pulses: Normal pulses.     Heart sounds: Normal heart sounds.  Pulmonary:     Effort: Pulmonary effort is normal. No respiratory distress.     Breath sounds: Normal breath sounds.  Abdominal:     General: Bowel sounds are normal. There is no distension.     Palpations: Abdomen is soft.     Tenderness: There is no abdominal tenderness.  Musculoskeletal:     Cervical back: Neck supple.     Right lower leg: No edema.     Left lower leg: No edema.     Comments:  No abnormal movements present. Has extremity stiffness present  Lymphadenopathy:     Cervical: No cervical adenopathy.  Skin:    General: Skin is warm and dry.  Psychiatric:        Mood and Affect: Mood normal.    ASSESSMENT/ PLAN:  TODAY  1. Aortic atherosclerosis (cxr 10-04-20) will monitor   2. Metastatic melanoma to liver: 14.2 x 10.3 x 9.9 cm.  Will monitor   3. Vit B 12 deficiency level is 4163 is on monthly injections.   PREVIOUS   4. chronic urine retention: at this time foley is out; will continue proscar 5 mg daily   5. Constipation: is at this time stable abdomen is slightly distended will continue linzess 290 mcg daily and has prn dulcolax suppository  6. Essential hypertension: is stable b/p 148/63 will continue norvasc 10 mg daily lotensin 20 mg daily   7. PSP (progressive supranuclear palsy) is without change in status; will continue sinemet 25/100 mg  three times daily   8. Chronic hyponatremia: is stable na++ 131; will continue NACL 1 gm three times daily  9. Vascular  dementia without behavioral disturbance: is without change weight is 150 pounds will continue namenda 10 mg twice daily     Ok Edwards NP [facility] Adult Medicine  Contact [phone] Monday through Friday 8am- 5pm  After hours call [phone]

## 2020-12-26 ENCOUNTER — Non-Acute Institutional Stay (SKILLED_NURSING_FACILITY): Payer: Medicare PPO | Admitting: Adult Health

## 2020-12-26 ENCOUNTER — Encounter: Payer: Self-pay | Admitting: Adult Health

## 2020-12-26 DIAGNOSIS — G231 Progressive supranuclear ophthalmoplegia [Steele-Richardson-Olszewski]: Secondary | ICD-10-CM

## 2020-12-26 DIAGNOSIS — F0150 Vascular dementia without behavioral disturbance: Secondary | ICD-10-CM | POA: Diagnosis not present

## 2020-12-26 DIAGNOSIS — C787 Secondary malignant neoplasm of liver and intrahepatic bile duct: Secondary | ICD-10-CM

## 2020-12-26 NOTE — Progress Notes (Signed)
Location:  [facility] Room Number: 133-P Place of Service:  SNF (31)   CODE STATUS: DNR  Allergies  Allergen Reactions   Flomax [Tamsulosin Hcl]     Seizure     Chief Complaint  Patient presents with   Acute Visit    Care plan meeting.    HPI:  We have come together for his care plan meeting family present  no BIMS; no MOOD; he is nonverbal. He requires extensive assist to dependent for his adls. Requires assist with eating. He  is incontinent of bladder and bowel. There have been no falls. Therapy none at this time.   dietary: he is losing weight which is expected; his current weight is 137 pounds. Has a poor appetite;   his medications are slowly being removed.  There are no indications of pain or distress present. He continues to be followed for his chronic illnesses including: Metastatic melanoma to liver PSP (progressive supranuclear palsy)   Vascular dementia without behavioral disturbance  he is followed by hospice care.   Past Medical History:  Diagnosis Date   Chronic low back pain    Gait abnormality 11/15/2019   Hypertension    Metastatic melanoma to liver [facility])    PSP (progressive supranuclear palsy) ([place]) 11/15/2019   Vitamin B12 deficiency     Past Surgical History:  Procedure Laterality Date   CATARACT EXTRACTION, BILATERAL     COLONOSCOPY     COLONOSCOPY  02/04/2011   Procedure: COLONOSCOPY;  Surgeon: Daneil Dolin, MD;  Location[facility];  Service: Endoscopy;  Laterality: N/A;   removal of malignant melanoma  2016    Social History   Socioeconomic History   Marital status: Married    Spouse name: Not on file   Number of children: 4   Years of education: Not on file   Highest education level: Not on file  Occupational History   Occupation: retired  Tobacco Use   Smoking status: Never   Smokeless tobacco: Never  Vaping Use   Vaping Use: Never used  Substance and Sexual Activity   Alcohol use: Not Currently     Alcohol/week: 0.0 standard drinks   Drug use: No   Sexual activity: Not Currently  Other Topics Concern   Not on file  Social History Narrative   Not on file   Social Determinants of Health   Financial Resource Strain: Low Risk    Difficulty of Paying Living Expenses: Not hard at all  Food Insecurity: No Food Insecurity   Worried About Charity fundraiser in the Last Year: Never true   [place] in the Last Year: Never true  Transportation Needs: No Transportation Needs   Lack of Transportation (Medical): No   Lack of Transportation (Non-Medical): No  Physical Activity: Inactive   Days of Exercise per Week: 0 days   Minutes of Exercise per Session: 0 min  Stress: No Stress Concern Present   Feeling of Stress : Only a little  Social Connections: Engineer, building services of Communication with Friends and Family: More than three times a week   Frequency of Social Gatherings with Friends and Family: More than three times a week   Attends Religious Services: More than 4 times per year   Active Member of [employer] or Organizations: Yes   Attends Archivist Meetings: Not on file   Marital Status: Married  Human resources officer Violence: Not At Risk   Fear of  Current or Ex-Partner: No   Emotionally Abused: No   Physically Abused: No   Sexually Abused: No   Family History  Problem Relation Age of Onset   Diabetes Mother    Cancer Father    Cancer - Lung Brother       VITAL SIGNS BP 138/78   Pulse 85   Temp (!) 97.5 F (36.4 C)   Resp 20   Ht 6' (1.829 m)   Wt 137 lb (62.1 kg)   SpO2 93%   BMI 18.58 kg/m   Outpatient Encounter Medications as of 12/26/2020  Medication Sig   acetaminophen (TYLENOL) 325 MG tablet Take 2 tablets (650 mg total) by mouth every 6 (six) hours as needed for mild pain (or Fever >/= 101).   acetaminophen (TYLENOL) 325 MG tablet Take 650 mg by mouth 3 (three) times daily.   amLODipine (NORVASC) 10 MG tablet Take 1 tablet (10 mg  total) by mouth daily. For BP   Balsam [place]-Castor Oil (VENELEX) OINT Apply topically. Special Instructions: Apply Venelex to sacrum and buttocks Q shift and after each incontinence episode.   bisacodyl (DULCOLAX) 10 MG suppository Place 1 suppository (10 mg total) rectally daily as needed for mild constipation or moderate constipation.   finasteride (PROSCAR) 5 MG tablet Take 1 tablet (5 mg total) by mouth daily.   linaclotide (LINZESS) 290 MCG CAPS capsule Take 290 mcg by mouth daily before breakfast.   lisinopril (ZESTRIL) 20 MG tablet Take 20 mg by mouth daily.   memantine (NAMENDA) 10 MG tablet Take 10 mg by mouth daily with lunch.   NON FORMULARY Diet Regular   OXYGEN Inhale 2 L into the lungs See admin instructions. PRN while awake to keep O2 sats above 90%   Polyvinyl Alcohol-Povidone PF 1.4-0.6 % SOLN Apply 1 drop to eye daily as needed (FOR DRY EYE RELIEF).    simethicone (MYLICON) 588 MG chewable tablet Chew 125 mg by mouth every 6 (six) hours as needed for flatulence.   [DISCONTINUED] carbidopa-levodopa (SINEMET IR) 25-100 MG tablet Take 1.5 tablets by mouth 3 (three) times daily.   [DISCONTINUED] levothyroxine (SYNTHROID) 25 MCG tablet Take 25 mcg by mouth daily before breakfast.   [DISCONTINUED] NON FORMULARY Fluid restrictions 1500CC in 24 hours. Dietary 500cc Nursing provider 7a-3p 420cc and 3p-11p 420cc and 11p-7a 160cc . Every Shift Day, Evening, Night   [DISCONTINUED] polyethylene glycol (MIRALAX / GLYCOLAX) 17 g packet Take 17 g by mouth daily.   [DISCONTINUED] sodium chloride 1 g tablet Take 1 g by mouth 3 (three) times daily.   No facility-administered encounter medications on file as of 12/26/2020.     SIGNIFICANT DIAGNOSTIC EXAMS   PREVIOUS   10-04-20: chest x-ray:  Bilateral layering pleural effusions. Bilateral lower lobe airspace opacities. Heart is normal size. Aortic atherosclerosis. No acute bony abnormality.  10-04-20: ct of abdomen and pelvis 1. Masslike  process with marginal enhancement and central necrosis centered in the left hepatic lobe, measuring 14.2 by 10.3 by 9.9 cm (volume = 760 cm^3). Possibilities include malignancy or abscess. Correlate with 2. Linear lucency inferiorly in segment 3 of the liver could be from a dilated intrahepatic bile duct or possibly portal vein thrombosis of the associated portal vein tributary. 3. Large bilateral pleural effusions with associated passive atelectasis. These effusions are nonspecific for transudative or exudative etiology. 4. Prominent stool throughout much of the colon, but not in the sigmoid colon or rectum. Borderline rectal wall thickening, without surrounding adenopathy. 5. Other imaging  findings of potential clinical significance: Coronary atherosclerosis. Small pericardial effusion. Remote compression fractures at L2, L4, and L5. Lumbar spondylosis and degenerative disc disease causing multilevel impingement. Mild prostatomegaly. 6. Despite efforts by the technologist and patient, motion artifact is present on today's exam and could not be eliminated. This reduces exam sensitivity and specificity. 7. Aortic atherosclerosis.  10-04-20: ct of head:  1. No acute intracranial findings. 2. Periventricular white matter and corona radiata hypodensities favor chronic ischemic microvascular white matter disease. 3. Mild chronic ethmoid sinusitis.  10-07-20: chest x-ray:  Improved resolved right-sided pleural effusion, without pneumothorax. Persistent left pleural effusion, adjacent atelectasis, and interstitial edema.  NO NEW EXAMS   LABS REVIEWED PREVIOUS    10-04-20: wbc 12.0; hgb 12.9; hct 38.8; mcv 96.0 plt 324; glucose 104; bun 18; creat 0.77; k+ 4.2; na++ 121 ca 8.5 GFR >60 alk phos 134; albumin 3.5  Urine culture no growth 10-05-20: vit B 12: 4163 10-06-20: wbc 13.9; hgb 12.3; hct 37.6; mcv 96.2 plt 356; glucose 89; bun 19; creat 0.74; k+ 4.3; na++ 127; ca 8.5 GFR>60 mag 2.0 10-09-20: wbc  11.0; hgb 11.7; hct 36.0; mcv 97.0 plt 342; glucose 88; bun 15; creat 0.72; k+ 3.8; na++ 128; ca 8.3 GFR>60 10-10-20: wbc 10.4; hgb 11.3; hct 35.0; mcv 96.7 plt 359; glucose 88; bun 15; creat 0.72 k+ 3.8; na++ 128; ca 8.3 GFR>60 10-16-20: glucose 92; bun 14; creat 0.51; k+ 4.3; na++ 127; ca 8.8 GFR>60; mag 2.0 10-21-20: wbc 12.7; hgb 14.7; hct 46.4; mcv 97.9 plt 366; glucose 91; bun 15; creat 0.56; k+ 3.8; na++ 127; ca 8.7 GFR>60; alk phos 248; albumin 4.0; LDH 185; tsh 9.452 10-28-20: glucose 77; bun 15; creat 0.56; k+ 3.2; na++ 131; ca 8.5 GFR>60 10-29-20: k+ 3.9   NO NEW LABS.    Review of Systems  Unable to perform ROS: Dementia (unable to participate)   Physical Exam Constitutional:      General: He is not in acute distress.    Appearance: He is well-developed. He is not diaphoretic.  Neck:     Thyroid: No thyromegaly.  Cardiovascular:     Rate and Rhythm: Normal rate and regular rhythm.     Heart sounds: Normal heart sounds.  Pulmonary:     Effort: Pulmonary effort is normal. No respiratory distress.     Breath sounds: Normal breath sounds.  Abdominal:     General: Bowel sounds are normal. There is no distension.     Palpations: Abdomen is soft.     Tenderness: There is no abdominal tenderness.  Musculoskeletal:     Cervical back: Neck supple.     Right lower leg: No edema.     Left lower leg: No edema.     Comments: Has extremity stiffness present.   Lymphadenopathy:     Cervical: No cervical adenopathy.  Skin:    General: Skin is warm and dry.  Neurological:     Comments: Not aware      ASSESSMENT/ PLAN:  TODAY  Metastatic melanoma to liver PSP (progressive supranuclear palsy)  Vascular dementia without behavioral disturbance  Will stop the following medications; namenda; norvasc; finasteride; lisinopril; linzess  Will continue current plan of care  He continues to be followed by hospice care.   Time spent with patient: 40 minutes: goals of care; medication  use; end of life discussion    Ok Edwards NP [facility] Adult Medicine  Contact [phone] Monday through Friday 8am- 5pm  After hours call [phone]

## 2021-01-08 ENCOUNTER — Ambulatory Visit: Payer: Medicare PPO | Admitting: Neurology

## 2021-01-08 ENCOUNTER — Telehealth: Payer: Self-pay | Admitting: Internal Medicine

## 2021-01-08 NOTE — Telephone Encounter (Signed)
Pt has expired

## 2021-01-12 NOTE — Telephone Encounter (Signed)
Thank you for the update!

## 2021-01-17 DEATH — deceased

## 2021-06-19 ENCOUNTER — Encounter (HOSPITAL_COMMUNITY): Payer: Self-pay | Admitting: Speech Pathology

## 2021-06-19 NOTE — Therapy (Signed)
[place], [place], [address] Phone: [phone]   Fax:  [phone]  Patient Details  Name: Luis Freeman MRN: [id number] Date of Birth: [birth year] Referring Provider:  No ref. provider found  Encounter Date: 06/19/2021  SPEECH THERAPY DISCHARGE SUMMARY  Visits from Start of Care: 13  Current functional level related to goals / functional outcomes: Limited progress   Remaining deficits: dysarthria   Education / Equipment: HEP presented   Patient agrees to discharge. Patient goals were partially met. Patient is being discharged due to not returning since the last visit.. Noted that Pt expired January 13, 2021  Thank you,  Genene Churn, [place]     [facility] 06/19/2021, 3:23 PM  [place], [place], [address] Phone: [phone]   Fax:  [phone]

## 2022-06-21 IMAGING — DX DG ABDOMEN 1V
2 series · 2 of 2 positions shown · non-contrast
Comparison: 02/27/2020

CLINICAL DATA: Lack of bowel movement

EXAM:
ABDOMEN - 1 VIEW

[abdomen supine ap (1 of 2)]
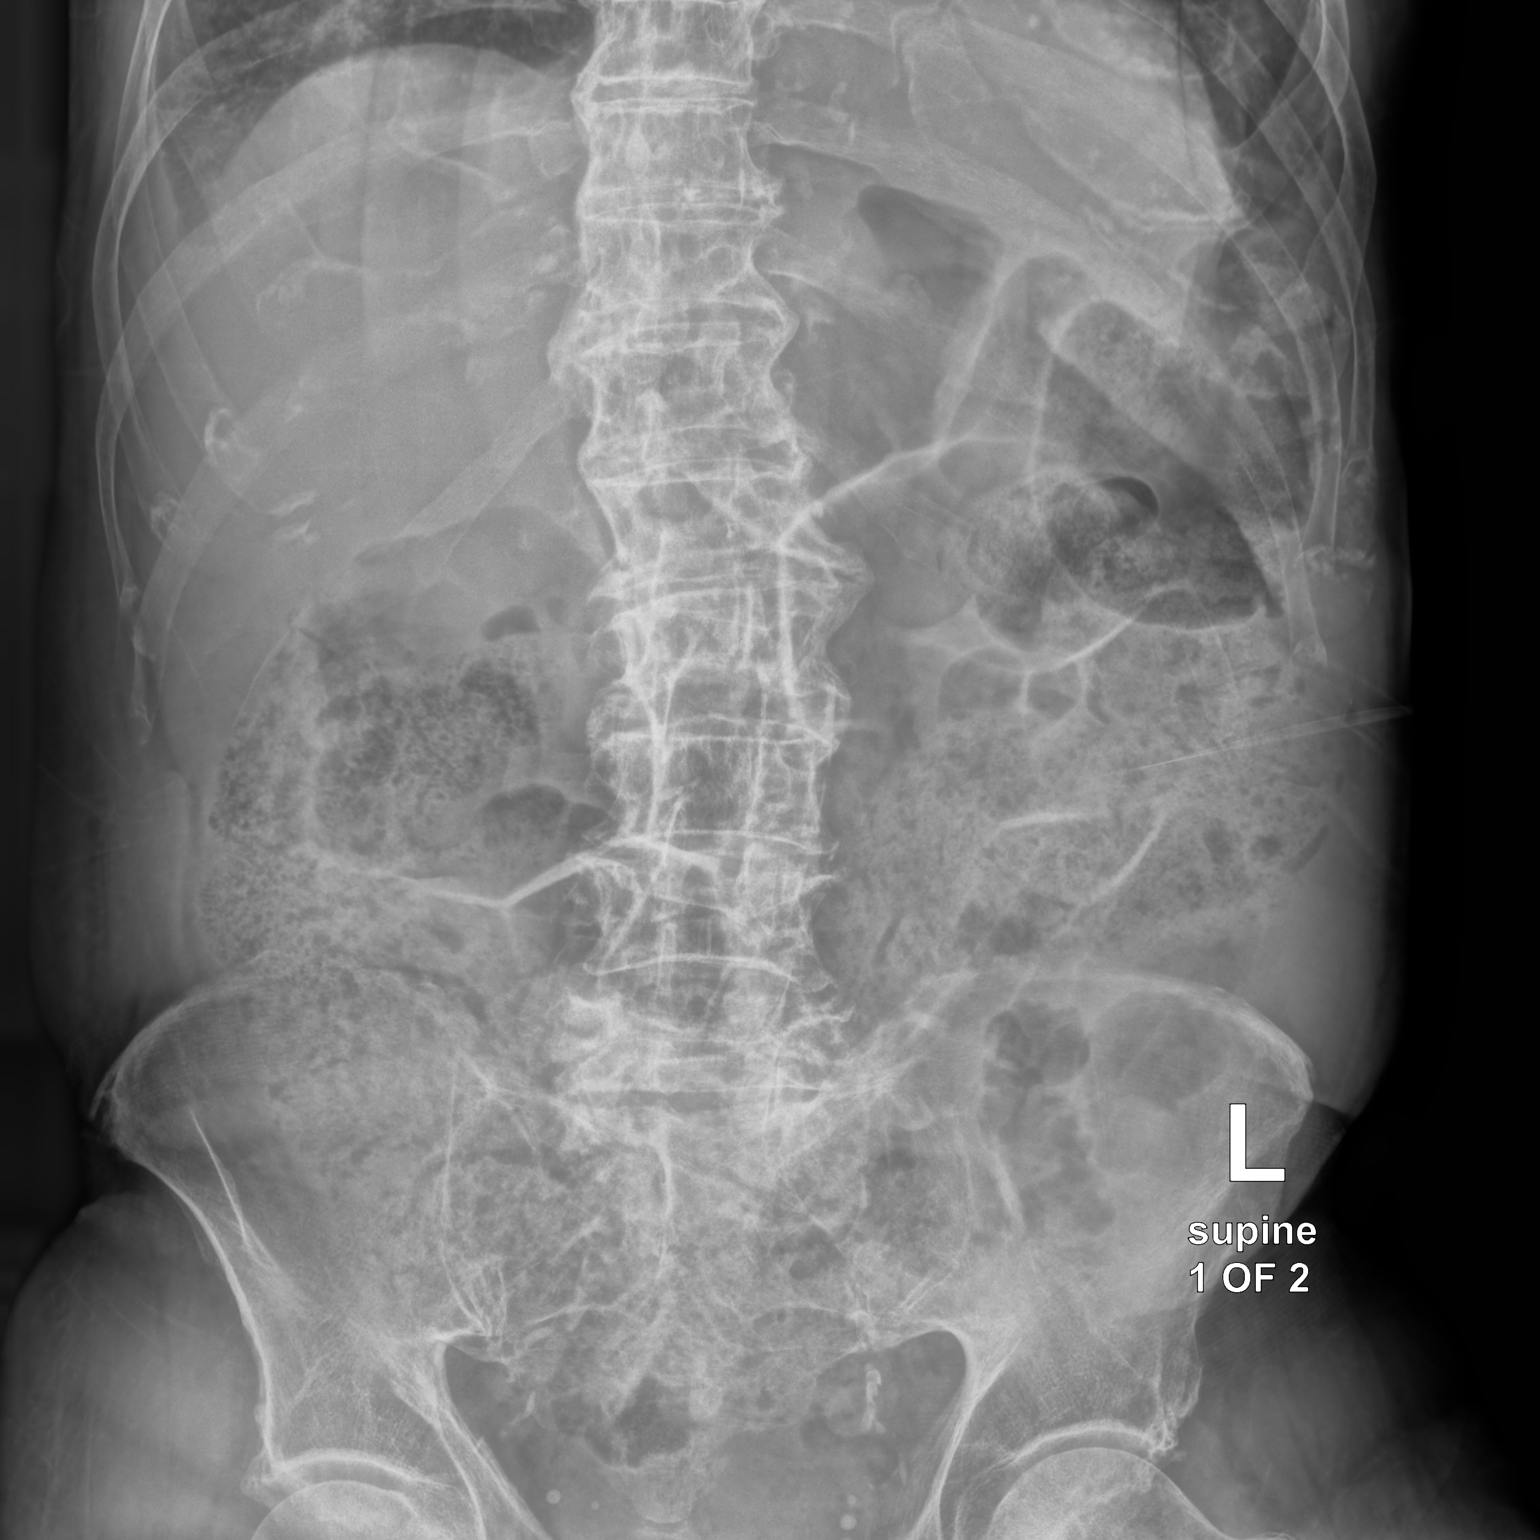

[abdomen supine ap (2 of 2)]
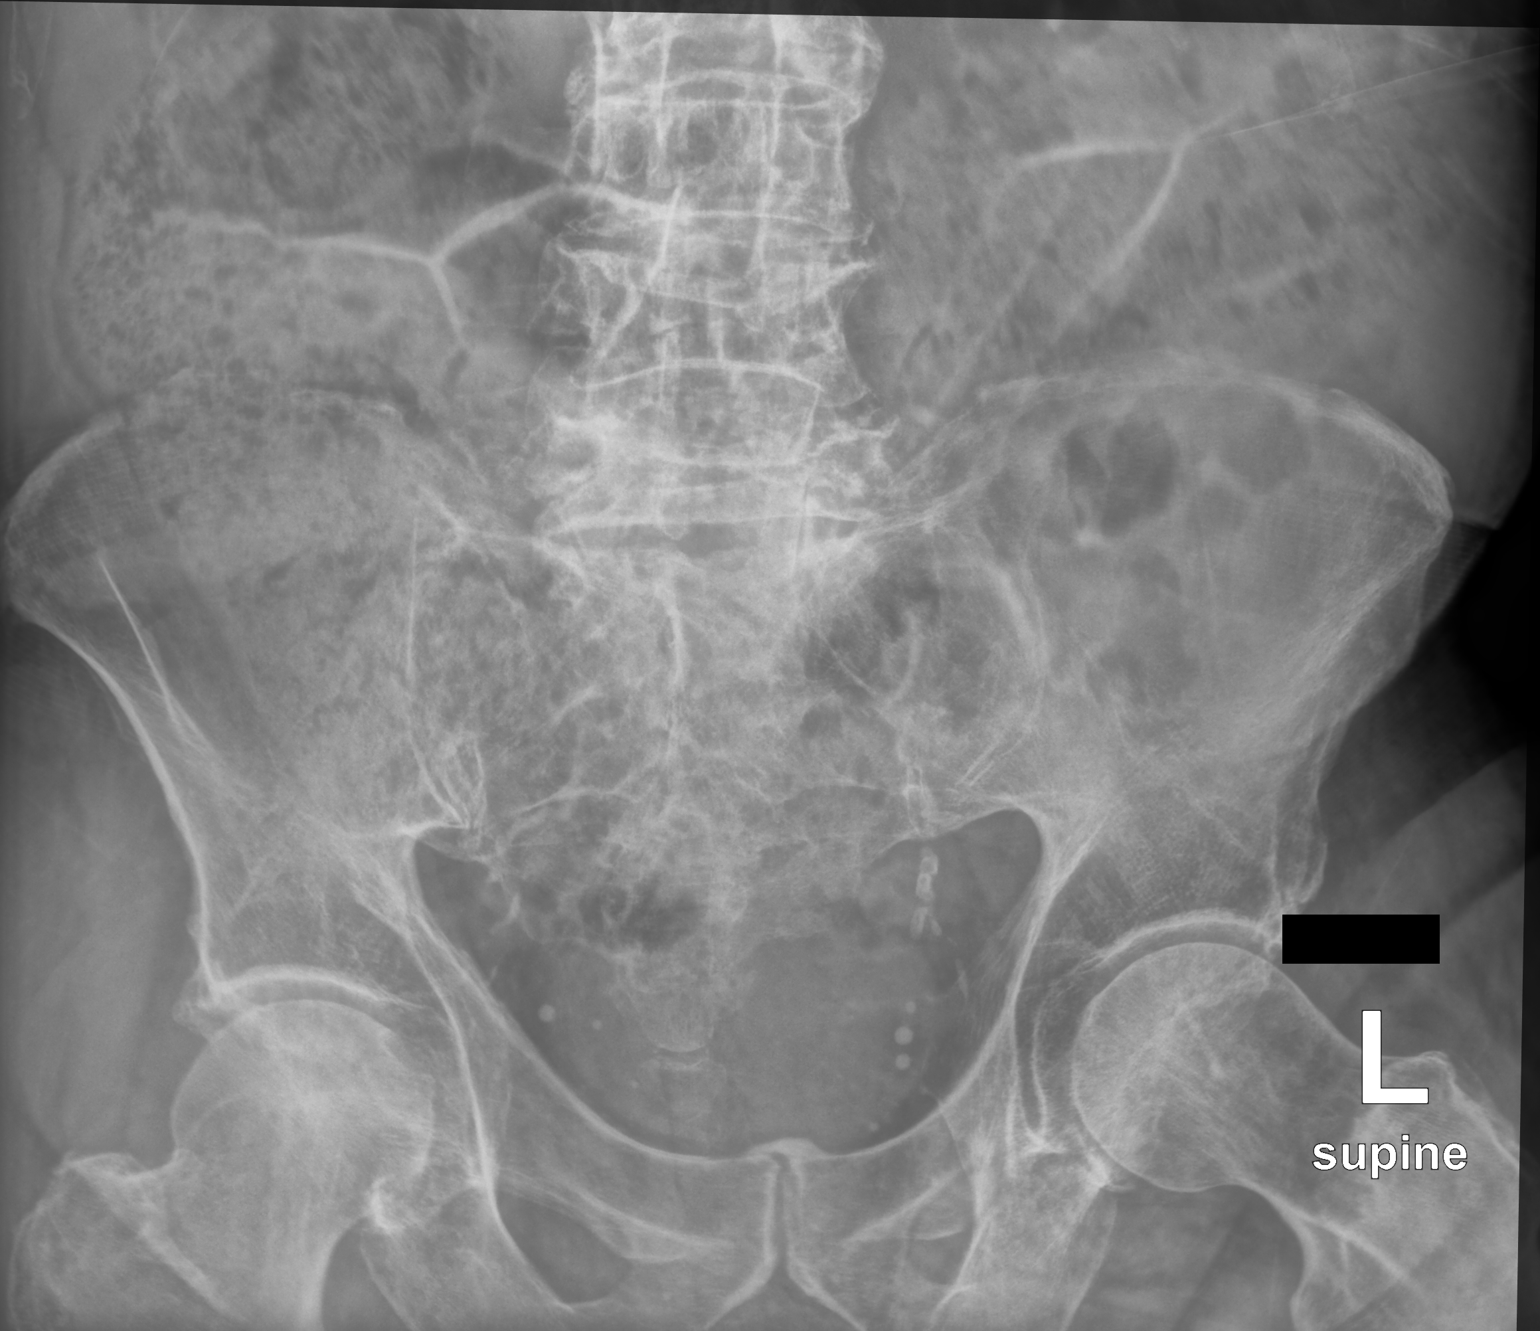

[2 of 2 positions shown; findings below may reference images not displayed]

FINDINGS: Prominent stool throughout the colon favors constipation.

Thoracic and lumbar spondylosis. Likely chronic suspected lumbar
compression fractures at L2, L4, and L5.

Poor definition of the left hemidiaphragm, cannot exclude left
basilar airspace opacity, consider chest radiography for further
workup. Questionable blunting of the right lateral costophrenic
angle.
IMPRESSION: 1. Prominent stool throughout the colon favors constipation.
2. Poor definition of the left hemidiaphragm, cannot exclude left
basilar airspace opacity, consider chest radiography for further
workup.
3. Possible small right pleural effusion.
4. Likely chronic lumbar compression fractures at L2, L4, and L5.

## 2022-06-21 IMAGING — CT CT ABD-PELV W/ CM
2 of 5 series · 14 of 46 positions shown, 16 images · IV contrast (Omnipaque or Isovue)
Comparison: Abdomen radiograph 02/27/2020

CLINICAL DATA: Abdominal distention and altered mental status,
constipation.

EXAM:
CT ABDOMEN AND PELVIS WITH CONTRAST
TECHNIQUE: Multidetector CT imaging of the abdomen and pelvis was performed
using the standard protocol following bolus administration of
intravenous contrast.
CONTRAST:  100mL OMNIPAQUE IOHEXOL 300 MG/ML  SOLN

[Series 2: axial st · axial · 0.70mm/px · z∈[-726,-276]mm · 11 of 102 slices shown, 13 images]
[im 6/102  soft-tissue]
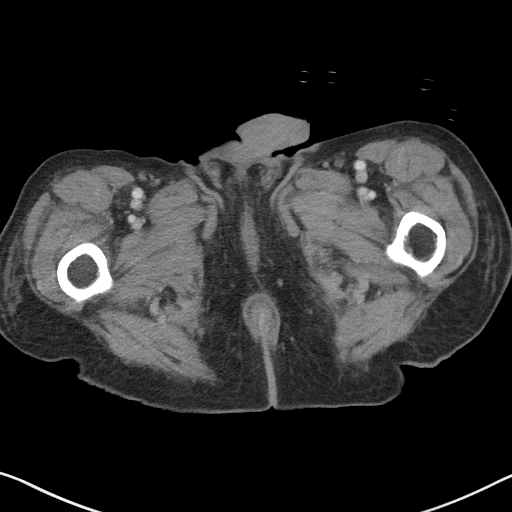
[im 6/102  bone]
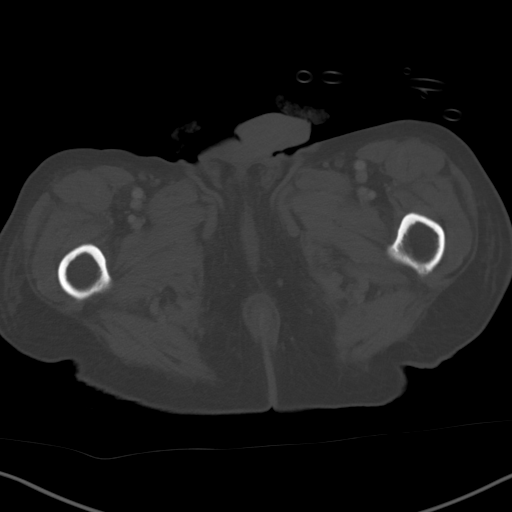
[im 16/102  soft-tissue]
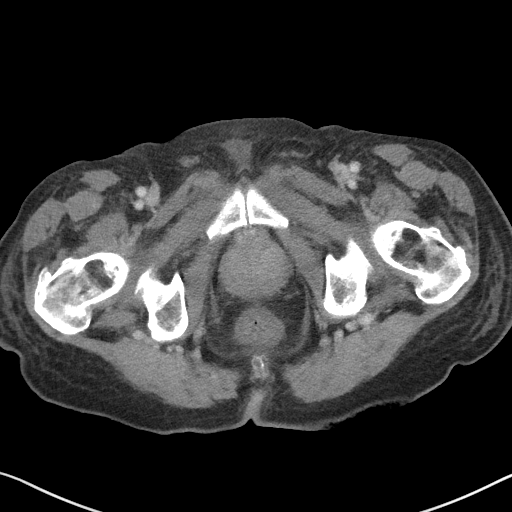
[im 27/102  soft-tissue]
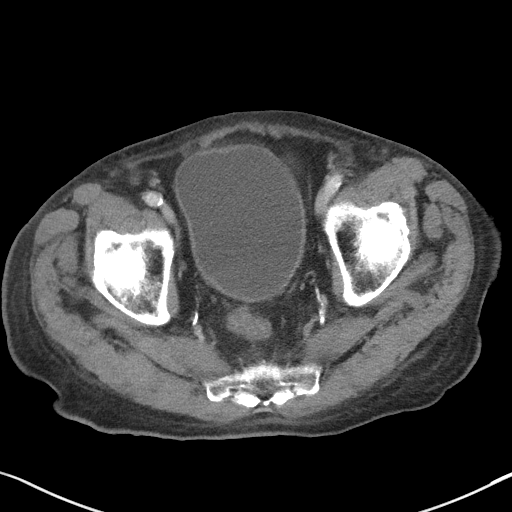
[im 32/102  soft-tissue]
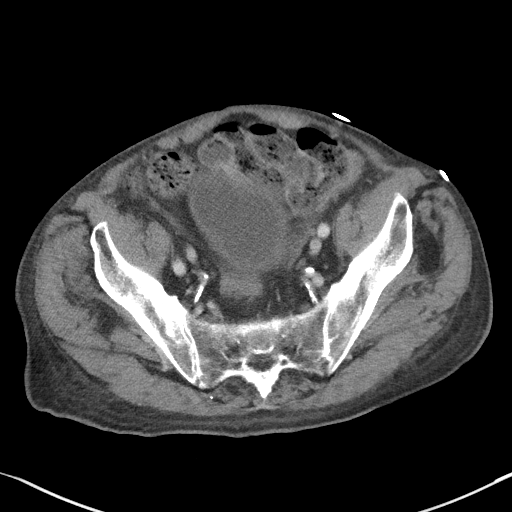
[im 43/102  soft-tissue]
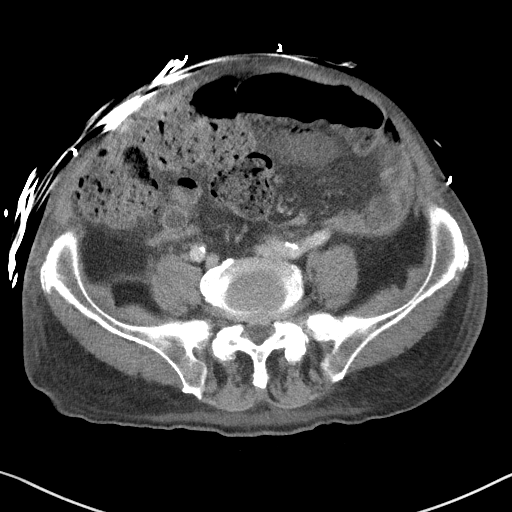
[im 54/102  soft-tissue]
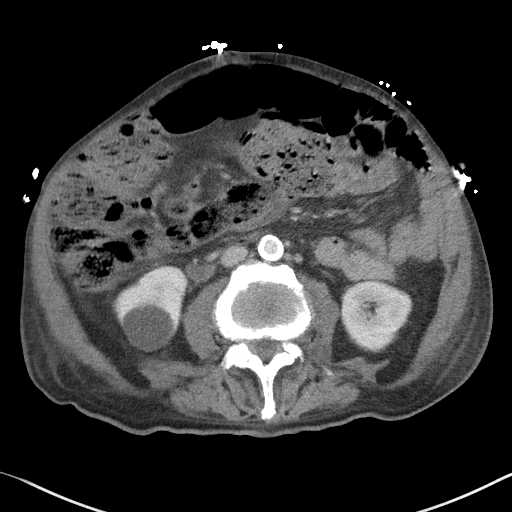
[im 59/102  soft-tissue]
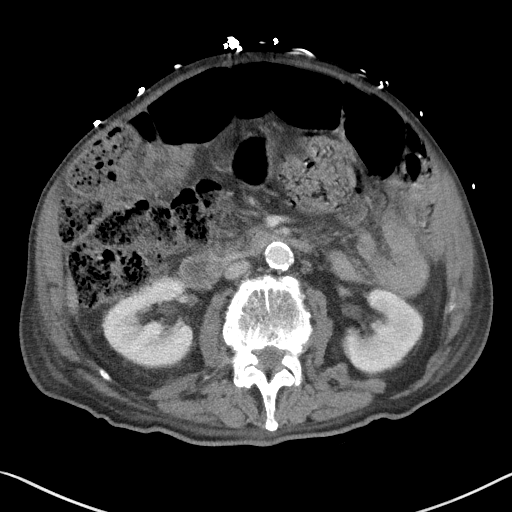
[im 70/102  soft-tissue]
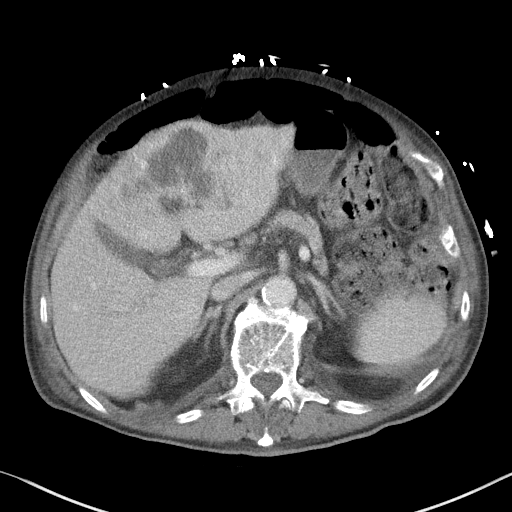
[im 75/102  soft-tissue]
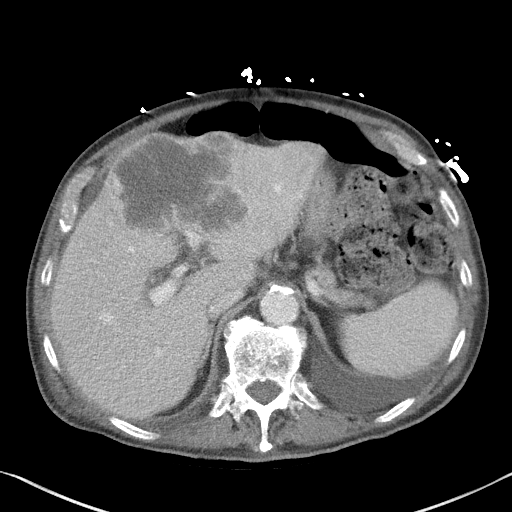
[im 75/102  bone]
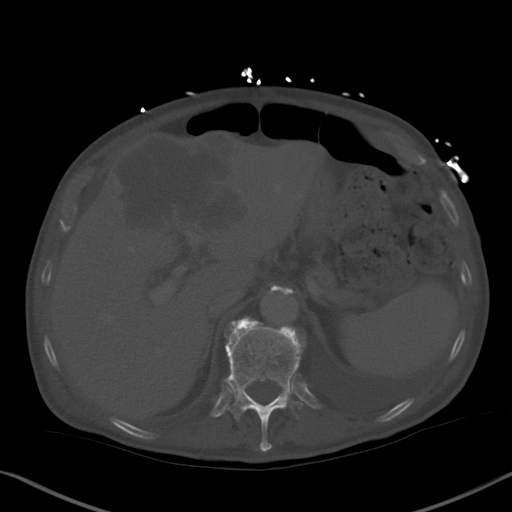
[im 86/102  soft-tissue]
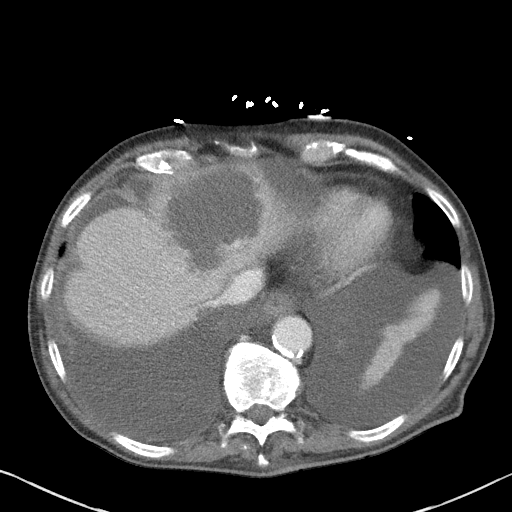
[im 96/102  soft-tissue]
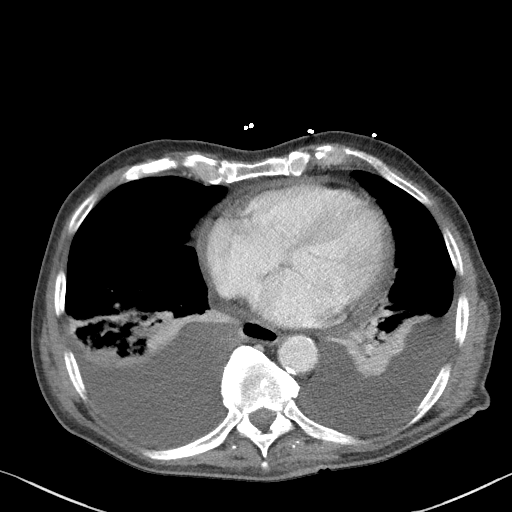

[Series 5: coronal st · coronal · 0.74mm/px · 3 of 110 slices shown]
[im 37/110  soft-tissue]
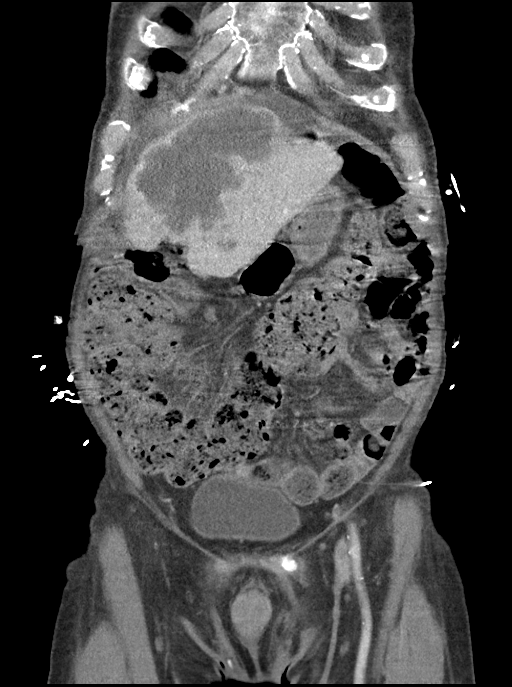
[im 49/110  soft-tissue]
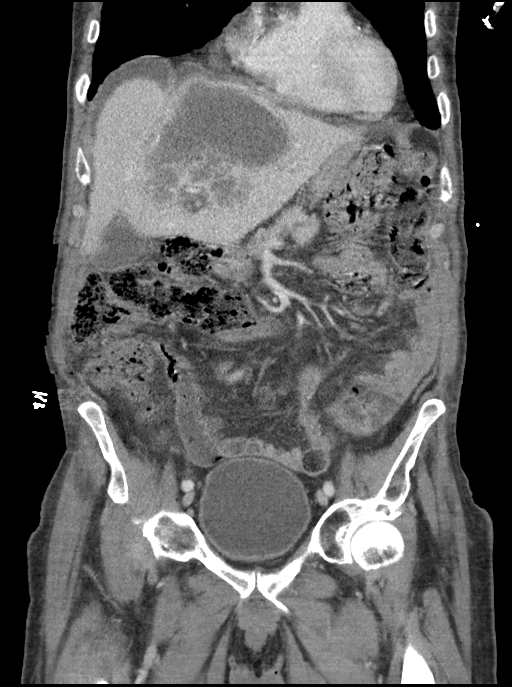
[im 61/110  soft-tissue]
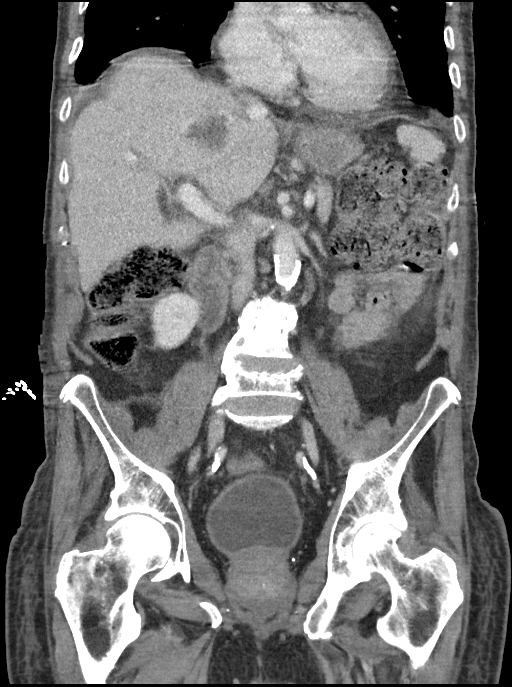

[14 of 46 positions shown; findings below may reference images not displayed]

FINDINGS: Despite efforts by the technologist and patient, motion artifact is
present on today's exam and could not be eliminated. This reduces
exam sensitivity and specificity.

Lower chest: Large bilateral pleural effusions with associated
passive atelectasis. These effusions are nonspecific for
transudative or exudative etiology.

Left anterior descending and right coronary artery atherosclerosis
along with descending thoracic aortic atherosclerosis. Small
pericardial effusion.

Hepatobiliary: Masslike process with marginal enhancement and
central hypoenhancement/central necrosis centered in the left
hepatic lobe, measuring 14.2 by 10.3 by 9.9 cm on image 51 series 5.
Possibilities include malignancy or abscess. Linear lucency
inferiorly in segment 3 of the liver could be from a prominent
intrahepatic bile duct or possibly portal vein thrombosis of the
associated portal vein tributary. The main portal vein is patent.

Gallbladder grossly unremarkable, somewhat blurred by motion
artifact.

Pancreas: Unremarkable

Spleen: Unremarkable.

Adrenals/Urinary Tract: Both adrenal glands appear normal. Simple
appearing cysts of the right kidney. Urinary bladder unremarkable.

Stomach/Bowel: Prominent stool throughout much of the colon, but not
in the sigmoid colon or rectum. It is difficult to identify the site
of transition due to the motion artifact in today's exam. However,
it is probably just distal to the splenic flexure. Borderline rectal
wall thickening, without surrounding adenopathy.

Vascular/Lymphatic: Aortoiliac atherosclerotic vascular disease.

Reproductive: Mild prostatomegaly.

Other: Mild diffuse subcutaneous and mesenteric edema.

Musculoskeletal: Remote compression fractures at L2, L4, and L5.
Lumbar spondylosis and degenerative disc disease causing multilevel
impingement.
IMPRESSION: 1. Masslike process with marginal enhancement and central necrosis
centered in the left hepatic lobe, measuring 14.2 by 10.3 by 9.9 cm
(volume = 760 cm^3). Possibilities include malignancy or abscess.
Correlate with
2. Linear lucency inferiorly in segment 3 of the liver could be from
a dilated intrahepatic bile duct or possibly portal vein thrombosis
of the associated portal vein tributary.
3. Large bilateral pleural effusions with associated passive
atelectasis. These effusions are nonspecific for transudative or
exudative etiology.
4. Prominent stool throughout much of the colon, but not in the
sigmoid colon or rectum. Borderline rectal wall thickening, without
surrounding adenopathy.
5. Other imaging findings of potential clinical significance:
Coronary atherosclerosis. Small pericardial effusion. Remote
compression fractures at L2, L4, and L5. Lumbar spondylosis and
degenerative disc disease causing multilevel impingement. Mild
prostatomegaly.
6. Despite efforts by the technologist and patient, motion artifact
is present on today's exam and could not be eliminated. This reduces
exam sensitivity and specificity.
7. Aortic atherosclerosis.

Aortic Atherosclerosis ([id number]).

## 2022-06-21 IMAGING — DX DG CHEST 1V PORT
1 series · 1 of 1 positions shown · non-contrast
Comparison: 10/20/2015

CLINICAL DATA: Weakness

EXAM:
PORTABLE CHEST 1 VIEW

[chest ap]
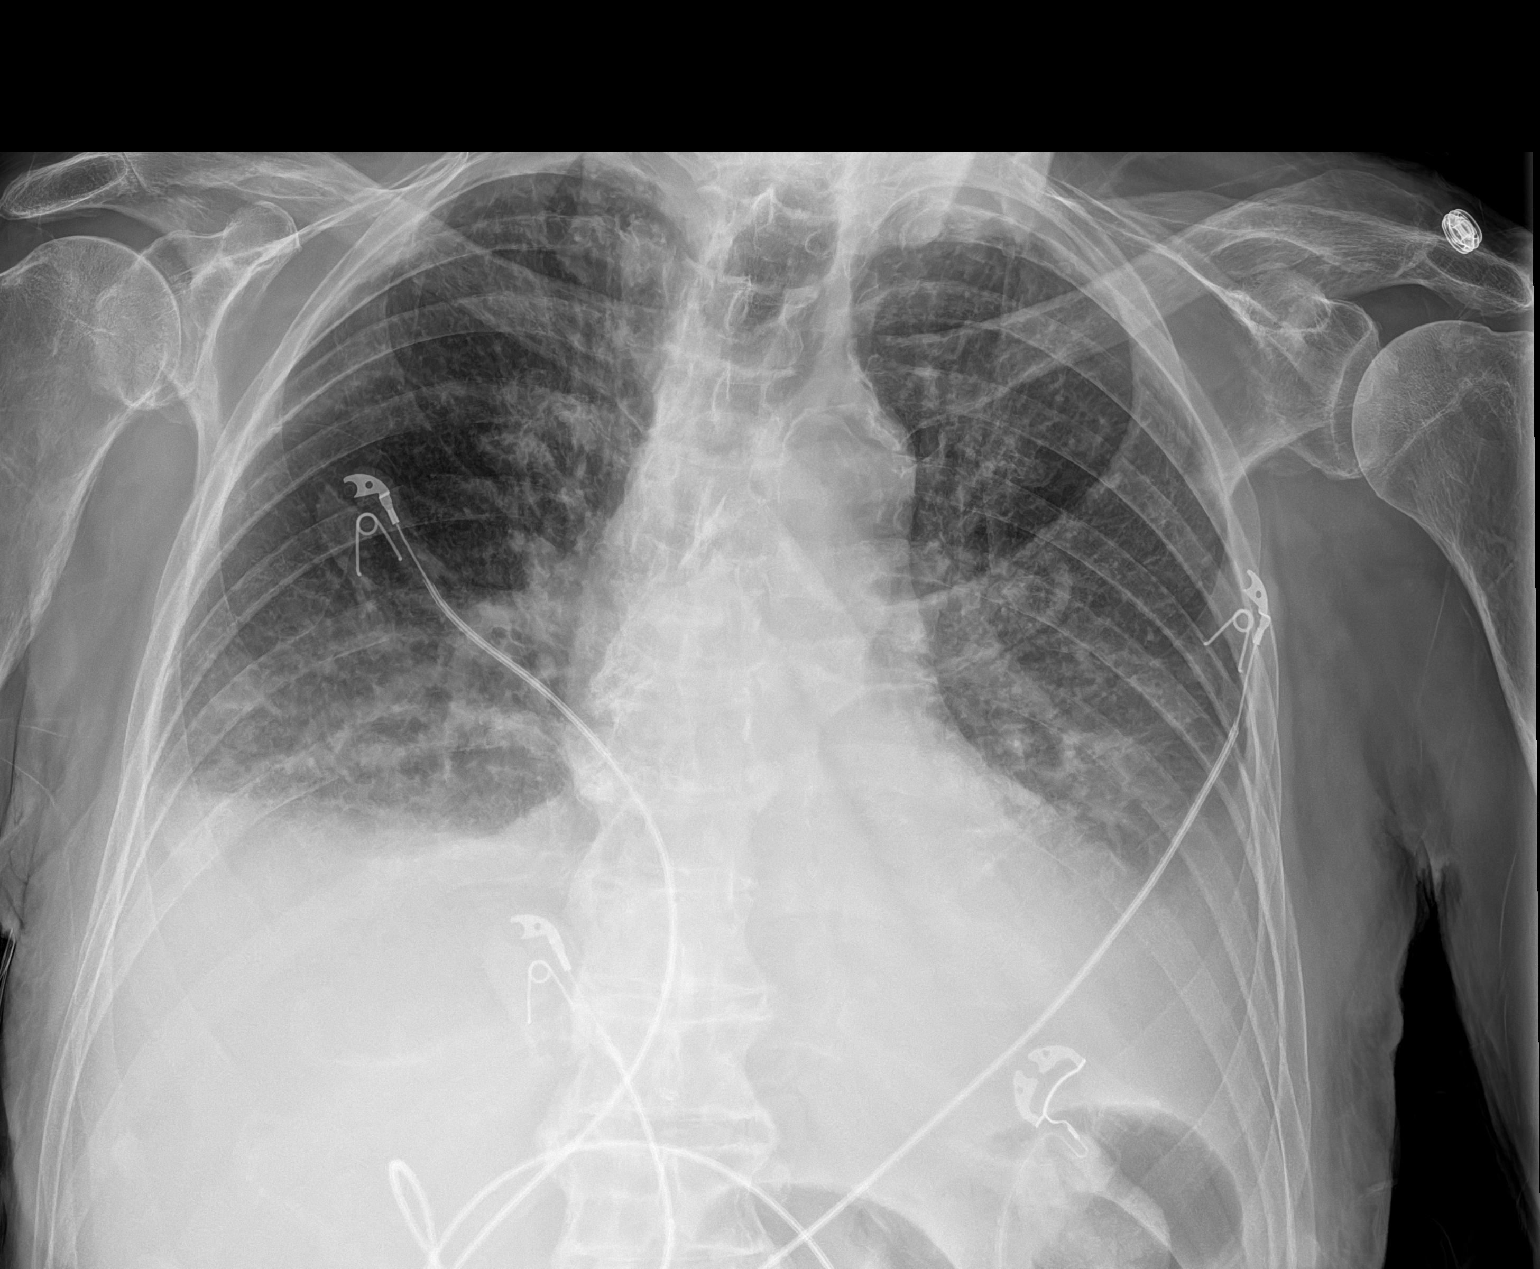

[1 of 1 positions shown; findings below may reference images not displayed]

FINDINGS: Bilateral layering pleural effusions. Bilateral lower lobe airspace
opacities. Heart is normal size. Aortic atherosclerosis. No acute
bony abnormality.
IMPRESSION: Bilateral layering effusions with bibasilar atelectasis or
infiltrates.

## 2022-06-25 IMAGING — US US BIOPSY CORE LIVER
1 series · 8 of 8 positions shown · non-contrast
Comparison: none

INDICATION: Indeterminate anterior large left liver mass

[Series 1: us biopsy (liver) · 8 of 8 slices shown]
[im 1/8]
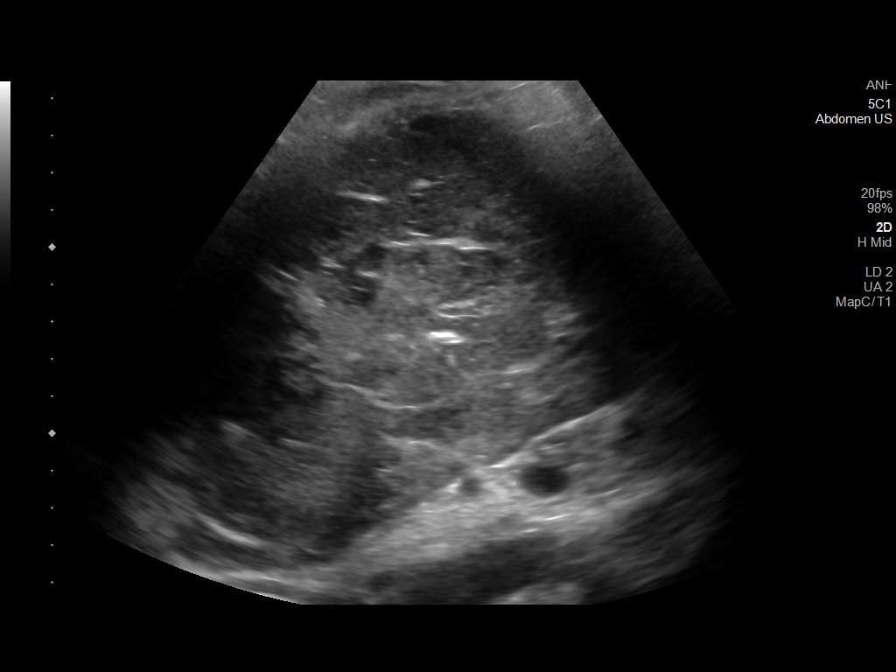
[im 2/8]
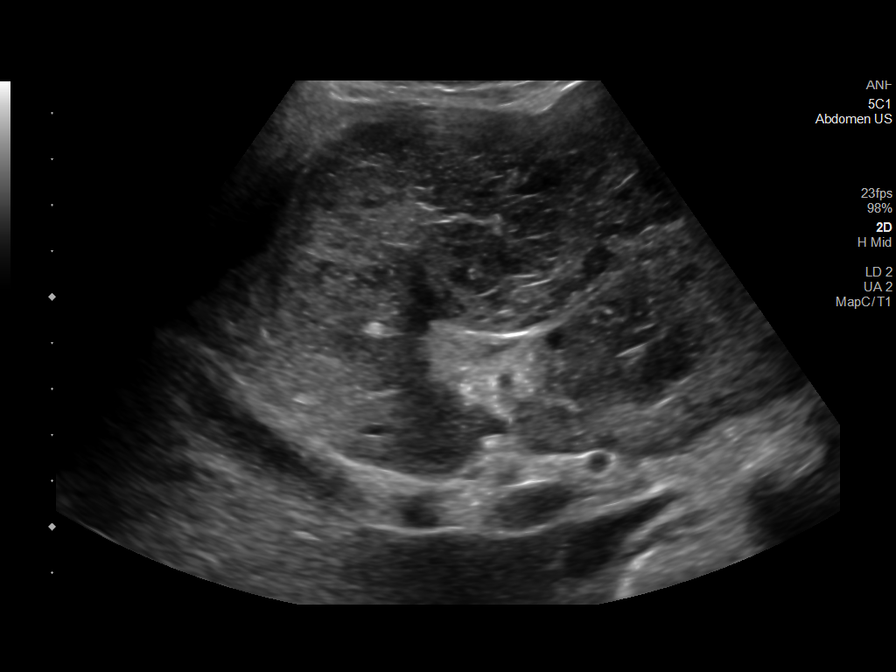
[im 3/8]
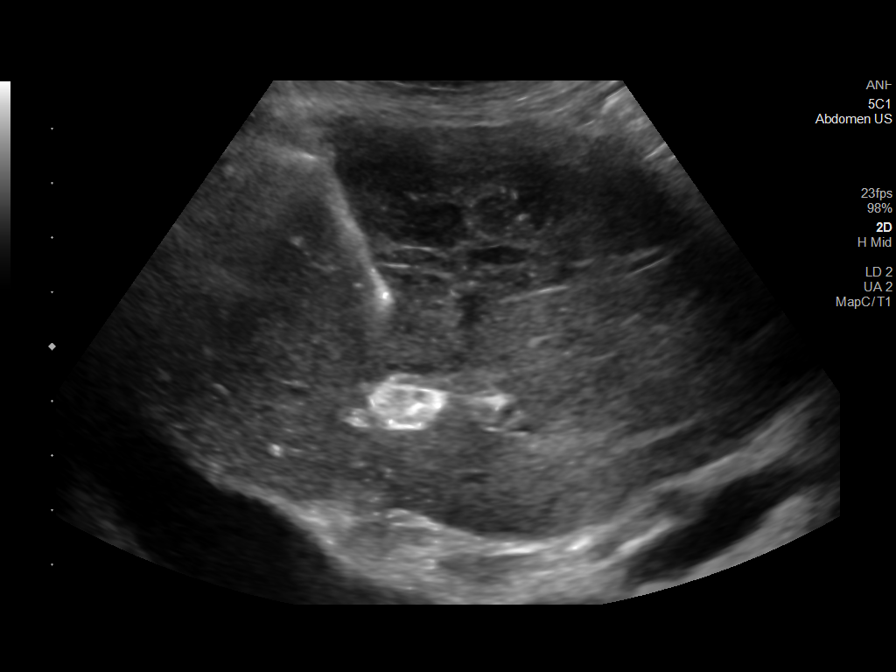
[im 4/8]
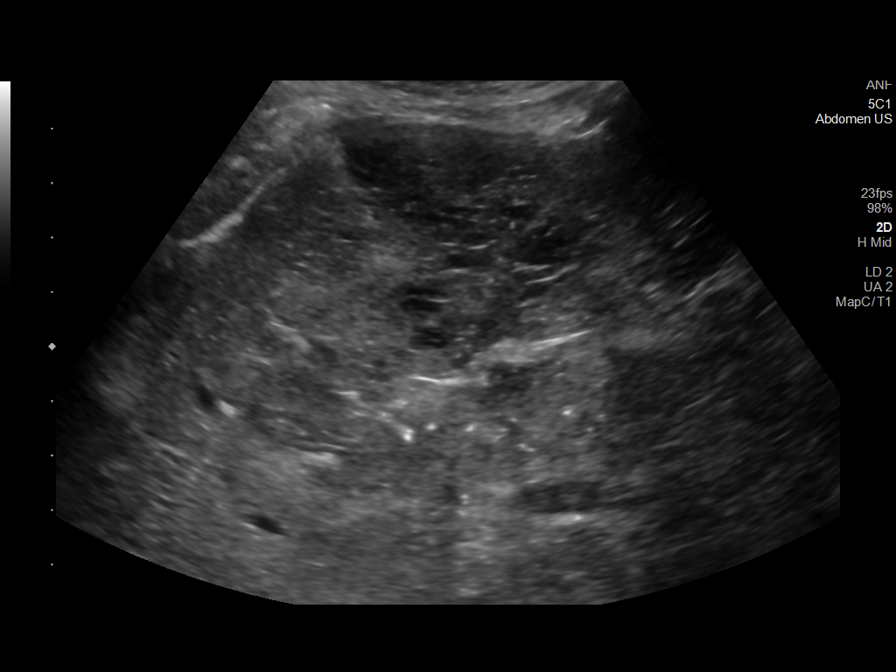
[im 5/8]
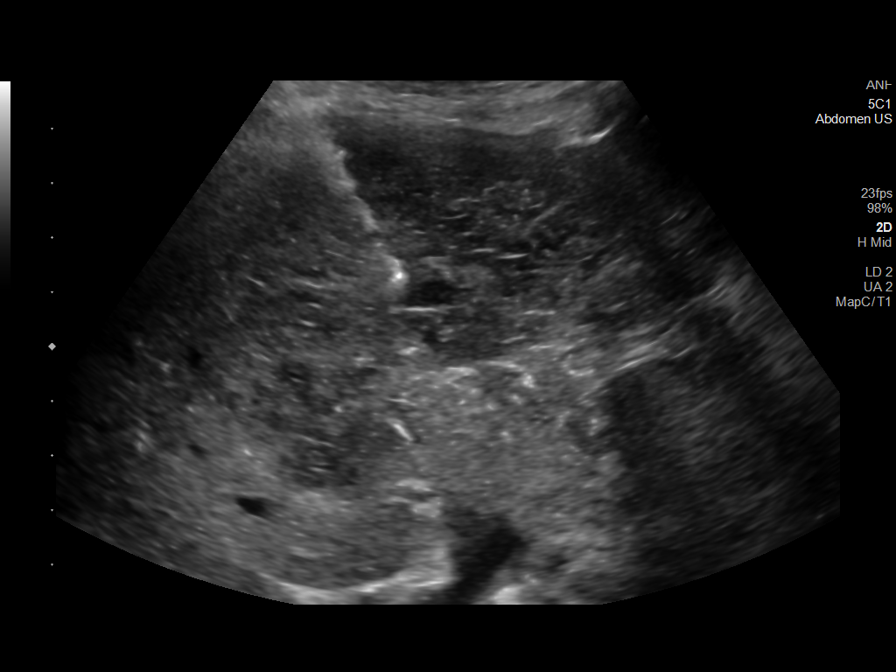
[im 6/8]
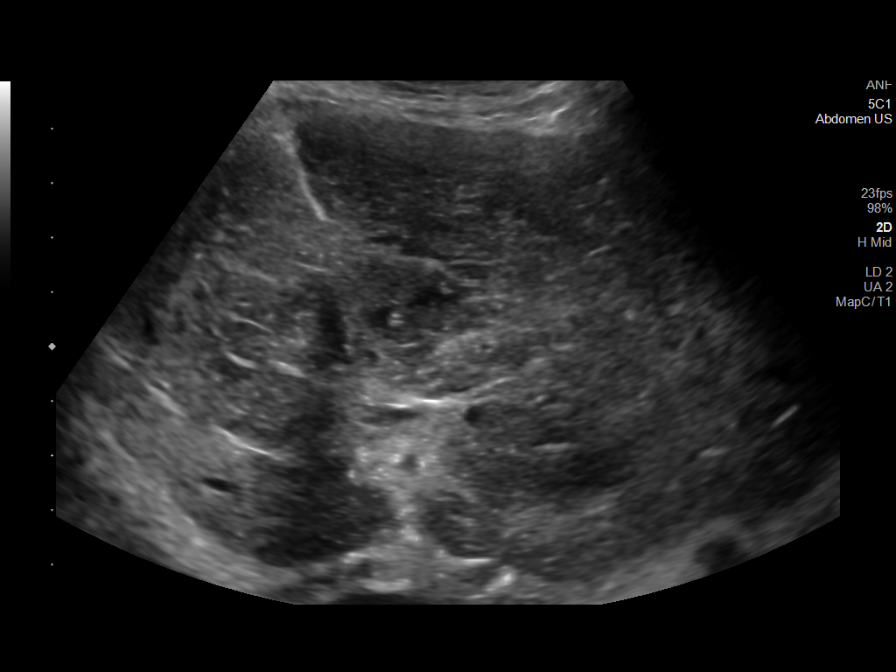
[im 7/8]
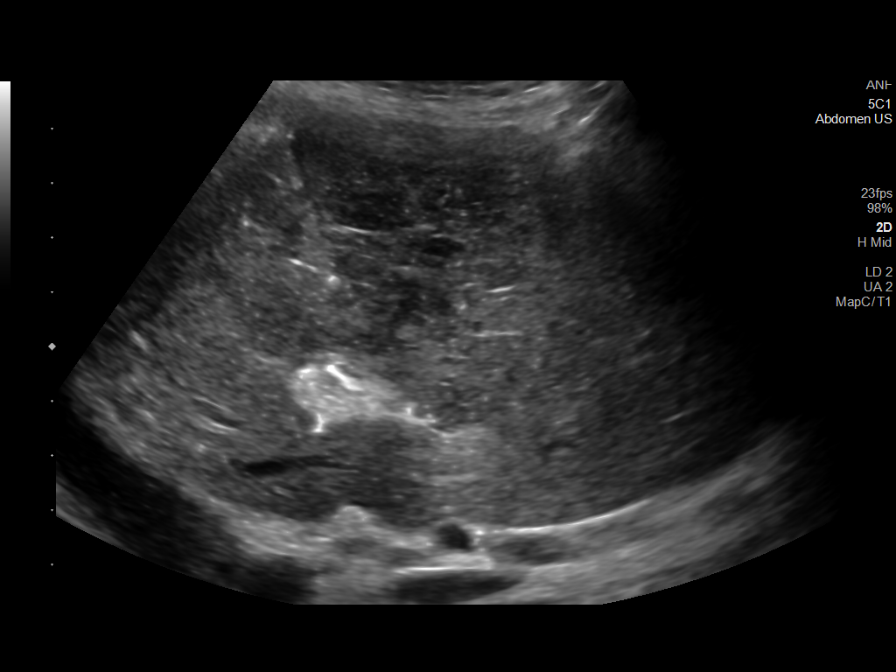
[im 8/8]
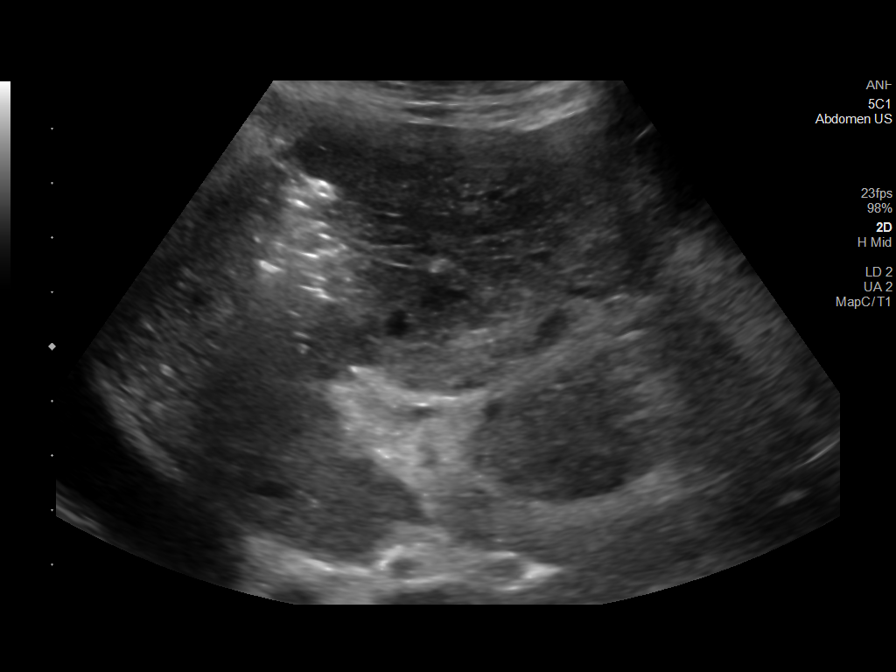

[8 of 8 positions shown; findings below may reference images not displayed]

EXAM:
ULTRASOUND GUIDED CORE BIOPSY OF ANTERIOR LEFT LIVER MASS

MEDICATIONS:
1% LIDOCAINE LOCAL

ANESTHESIA/SEDATION:
Moderate Sedation Time:  None.

The patient was continuously monitored during the procedure by the
interventional radiology nurse under my direct supervision.

FLUOROSCOPY TIME:  Fluoroscopy Time: None.

COMPLICATIONS:
None immediate.

PROCEDURE:
The procedure, risks, benefits, and alternatives were explained to
the patient. Questions regarding the procedure were encouraged and
answered. The patient understands and consents to the procedure.

Previous imaging reviewed. Preliminary ultrasound performed. The
large anterior left hepatic mass was localized from a subcostal
anterior approach.

Under sterile conditions and local anesthesia, a 17 gauge 6.8 cm
access was advanced into the lesion. Needle position confirmed with
ultrasound. 18 gauge core biopsies obtained. These were intact and
non fragmented. Samples placed in formalin. Needle tract occluded
with Gel-Foam. Postprocedure imaging demonstrates no hemorrhage or
hematoma. Patient tolerated biopsy well.
FINDINGS: Imaging confirms needle placement in the anterior large left liver
lesion for core biopsy
IMPRESSION: Successful ultrasound anterior left hepatic mass 18 gauge core
biopsy
